# Patient Record
Sex: Female | Born: 1935 | Race: White | Hispanic: No | Smoking: Never smoker
Health system: Southern US, Community
[De-identification: ages and names within clinical notes are randomized; demographics above are authoritative.]

## PROBLEM LIST (undated history)

## (undated) DIAGNOSIS — D649 Anemia, unspecified: Secondary | ICD-10-CM

## (undated) DIAGNOSIS — K900 Celiac disease: Secondary | ICD-10-CM

## (undated) DIAGNOSIS — G629 Polyneuropathy, unspecified: Secondary | ICD-10-CM

## (undated) DIAGNOSIS — F419 Anxiety disorder, unspecified: Secondary | ICD-10-CM

## (undated) DIAGNOSIS — K2950 Unspecified chronic gastritis without bleeding: Secondary | ICD-10-CM

## (undated) DIAGNOSIS — D34 Benign neoplasm of thyroid gland: Secondary | ICD-10-CM

## (undated) DIAGNOSIS — R002 Palpitations: Secondary | ICD-10-CM

## (undated) DIAGNOSIS — E785 Hyperlipidemia, unspecified: Secondary | ICD-10-CM

## (undated) DIAGNOSIS — B029 Zoster without complications: Secondary | ICD-10-CM

## (undated) DIAGNOSIS — H35721 Serous detachment of retinal pigment epithelium, right eye: Secondary | ICD-10-CM

## (undated) DIAGNOSIS — K222 Esophageal obstruction: Secondary | ICD-10-CM

## (undated) DIAGNOSIS — IMO0001 Reserved for inherently not codable concepts without codable children: Secondary | ICD-10-CM

## (undated) DIAGNOSIS — Z79890 Hormone replacement therapy (postmenopausal): Secondary | ICD-10-CM

## (undated) DIAGNOSIS — K219 Gastro-esophageal reflux disease without esophagitis: Secondary | ICD-10-CM

## (undated) DIAGNOSIS — M357 Hypermobility syndrome: Secondary | ICD-10-CM

## (undated) DIAGNOSIS — M810 Age-related osteoporosis without current pathological fracture: Secondary | ICD-10-CM

## (undated) DIAGNOSIS — K449 Diaphragmatic hernia without obstruction or gangrene: Secondary | ICD-10-CM

## (undated) DIAGNOSIS — H269 Unspecified cataract: Secondary | ICD-10-CM

## (undated) DIAGNOSIS — K2970 Gastritis, unspecified, without bleeding: Secondary | ICD-10-CM

## (undated) HISTORY — DX: Gastritis, unspecified, without bleeding: K29.70

## (undated) HISTORY — DX: Zoster without complications: B02.9

## (undated) HISTORY — PX: ESOPHAGOGASTRODUODENOSCOPY: SHX1529

## (undated) HISTORY — DX: Unspecified chronic gastritis without bleeding: K29.50

## (undated) HISTORY — DX: Anemia, unspecified: D64.9

## (undated) HISTORY — DX: Celiac disease: K90.0

## (undated) HISTORY — DX: Palpitations: R00.2

## (undated) HISTORY — PX: COLONOSCOPY: SHX174

## (undated) HISTORY — DX: Serous detachment of retinal pigment epithelium, right eye: H35.721

## (undated) HISTORY — DX: Diaphragmatic hernia without obstruction or gangrene: K44.9

## (undated) HISTORY — PX: CATARACT EXTRACTION: SUR2

## (undated) HISTORY — DX: Reserved for inherently not codable concepts without codable children: IMO0001

## (undated) HISTORY — DX: Anxiety disorder, unspecified: F41.9

## (undated) HISTORY — DX: Hyperlipidemia, unspecified: E78.5

## (undated) HISTORY — DX: Esophageal obstruction: K22.2

## (undated) HISTORY — DX: Gastro-esophageal reflux disease without esophagitis: K21.9

## (undated) HISTORY — DX: Age-related osteoporosis without current pathological fracture: M81.0

## (undated) HISTORY — DX: Unspecified cataract: H26.9

## (undated) HISTORY — PX: RETINAL LASER PROCEDURE: SHX2339

## (undated) HISTORY — DX: Benign neoplasm of thyroid gland: D34

## (undated) HISTORY — DX: Hypermobility syndrome: M35.7

## (undated) HISTORY — DX: Polyneuropathy, unspecified: G62.9

## (undated) HISTORY — PX: MYOMECTOMY: SHX85

## (undated) HISTORY — PX: EYE SURGERY: SHX253

## (undated) HISTORY — PX: SIGMOIDOSCOPY: SUR1295

## (undated) HISTORY — DX: Hormone replacement therapy: Z79.890

## (undated) HISTORY — PX: ABDOMINAL HYSTERECTOMY: SHX81

---
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
## 1972-11-13 HISTORY — PX: PARTIAL HYSTERECTOMY: SHX80

## 1973-11-13 HISTORY — PX: THYROIDECTOMY: SHX17

## 2000-07-31 ENCOUNTER — Encounter: Payer: Self-pay | Admitting: Family Medicine

## 2000-07-31 ENCOUNTER — Ambulatory Visit (HOSPITAL_COMMUNITY): Admission: RE | Admit: 2000-07-31 | Discharge: 2000-07-31 | Payer: Self-pay | Admitting: Family Medicine

## 2003-05-14 ENCOUNTER — Encounter: Admission: RE | Admit: 2003-05-14 | Discharge: 2003-08-12 | Payer: Self-pay | Admitting: Unknown Physician Specialty

## 2003-06-08 ENCOUNTER — Encounter: Payer: Self-pay | Admitting: Gastroenterology

## 2004-05-19 ENCOUNTER — Other Ambulatory Visit: Admission: RE | Admit: 2004-05-19 | Discharge: 2004-05-19 | Payer: Self-pay | Admitting: Family Medicine

## 2005-05-26 ENCOUNTER — Other Ambulatory Visit: Admission: RE | Admit: 2005-05-26 | Discharge: 2005-05-26 | Payer: Self-pay | Admitting: Family Medicine

## 2008-07-23 ENCOUNTER — Telehealth: Payer: Self-pay | Admitting: Gastroenterology

## 2010-01-12 ENCOUNTER — Ambulatory Visit (HOSPITAL_COMMUNITY): Admission: RE | Admit: 2010-01-12 | Discharge: 2010-01-12 | Payer: Self-pay | Admitting: Family Medicine

## 2010-08-05 ENCOUNTER — Encounter: Payer: Self-pay | Admitting: Cardiology

## 2010-08-15 ENCOUNTER — Encounter: Payer: Self-pay | Admitting: Gastroenterology

## 2010-08-16 ENCOUNTER — Encounter: Payer: Self-pay | Admitting: Gastroenterology

## 2010-08-18 ENCOUNTER — Encounter: Payer: Self-pay | Admitting: Gastroenterology

## 2010-08-19 ENCOUNTER — Encounter: Payer: Self-pay | Admitting: Gastroenterology

## 2010-08-31 ENCOUNTER — Ambulatory Visit: Payer: Self-pay | Admitting: Cardiology

## 2010-08-31 DIAGNOSIS — R0789 Other chest pain: Secondary | ICD-10-CM

## 2010-09-26 ENCOUNTER — Encounter: Payer: Self-pay | Admitting: Gastroenterology

## 2010-09-26 DIAGNOSIS — K900 Celiac disease: Secondary | ICD-10-CM | POA: Insufficient documentation

## 2010-09-26 DIAGNOSIS — M810 Age-related osteoporosis without current pathological fracture: Secondary | ICD-10-CM | POA: Insufficient documentation

## 2010-09-26 DIAGNOSIS — K219 Gastro-esophageal reflux disease without esophagitis: Secondary | ICD-10-CM | POA: Insufficient documentation

## 2010-09-26 DIAGNOSIS — K222 Esophageal obstruction: Secondary | ICD-10-CM | POA: Insufficient documentation

## 2010-09-26 DIAGNOSIS — F411 Generalized anxiety disorder: Secondary | ICD-10-CM | POA: Insufficient documentation

## 2010-09-26 DIAGNOSIS — E785 Hyperlipidemia, unspecified: Secondary | ICD-10-CM | POA: Insufficient documentation

## 2010-09-29 ENCOUNTER — Ambulatory Visit: Payer: Self-pay | Admitting: Gastroenterology

## 2010-09-29 DIAGNOSIS — R109 Unspecified abdominal pain: Secondary | ICD-10-CM | POA: Insufficient documentation

## 2010-09-29 DIAGNOSIS — R079 Chest pain, unspecified: Secondary | ICD-10-CM | POA: Insufficient documentation

## 2010-09-30 ENCOUNTER — Ambulatory Visit (HOSPITAL_COMMUNITY)
Admission: RE | Admit: 2010-09-30 | Discharge: 2010-09-30 | Payer: Self-pay | Source: Home / Self Care | Admitting: Gastroenterology

## 2010-09-30 ENCOUNTER — Telehealth: Payer: Self-pay | Admitting: Gastroenterology

## 2010-09-30 DIAGNOSIS — D509 Iron deficiency anemia, unspecified: Secondary | ICD-10-CM

## 2010-09-30 LAB — CONVERTED CEMR LAB
Ferritin: 6.6 ng/mL — ABNORMAL LOW (ref 10.0–291.0)
Folate: >20 ng/mL
Iron: 32 ug/dL — ABNORMAL LOW (ref 42–145)
Saturation Ratios: 6.1 % — ABNORMAL LOW (ref 20.0–50.0)
Transferrin: 375 mg/dL — ABNORMAL HIGH (ref 212.0–360.0)
Vitamin B-12: 562 pg/mL (ref 211–911)

## 2010-10-03 LAB — CONVERTED CEMR LAB
IgA: 244 mg/dL (ref 68–378)
Tissue Transglutaminase Ab, IgA: 53.4 units — ABNORMAL HIGH (ref <20)

## 2010-12-03 ENCOUNTER — Encounter: Payer: Self-pay | Admitting: Family Medicine

## 2010-12-15 NOTE — Procedures (Signed)
Summary: Colonoscopy   Colonoscopy  Procedure date:  06/08/2003  Findings:      Location:  [facility]   Patient Name: Kristi Sawyer, Kristi Sawyer MRN:  Procedure Procedures: Colonoscopy CPT: [phone].  Personnel: Endoscopist: Loralee Pacas. Sharlett Iles, MD.  Exam Location: Exam performed in Outpatient Clinic. Outpatient  Patient Consent: Procedure, Alternatives, Risks and Benefits discussed, consent obtained, from patient. Consent was obtained by the RN.  Indications Symptoms: Diarrhea  Average Risk Screening Routine.  History  Pre-Exam Physical: Performed Jun 08, 2003. Cardio-pulmonary exam, Rectal exam, Abdominal exam, Extremity exam, Mental status exam WNL.  Exam Exam: Extent of exam reached: Cecum, extent intended: Cecum.  The cecum was identified by appendiceal orifice and IC valve. Patient position: on left side. Duration of exam: 20 minutes. Colon retroflexion performed. Images taken. ASA Classification: I. Tolerance: excellent.  Monitoring: Pulse and BP monitoring, Oximetry used. Supplemental O2 given. at 2 Liters.  Colon Prep Used Golytely for colon prep. Prep results: excellent.  Sedation Meds: Patient assessed and found to be appropriate for moderate (conscious) sedation. Fentanyl 50 mcg. given IV. Versed 3 mg. given IV.  Instrument(s): CF 140L. Serial [id number].  Findings - NORMAL EXAM: Cecum to Rectum. Not Seen: Polyps. AVM's. Colitis. Tumors. Crohn's. Diverticulosis.   Assessment Normal examination.  Events  Unplanned Interventions: No intervention was required.  Plans Medication Plan: Continue current medications.  Disposition: After procedure patient sent to recovery. After recovery patient sent home.  Scheduling/Referral: EGD, to [employer]. Sharlett Iles, MD, on Jun 08, 2003.    CC: Redge Gainer, MD  This report was created from the original endoscopy report, which was reviewed and signed by the above listed endoscopist.

## 2010-12-15 NOTE — Letter (Signed)
Summary: Pt's Medical Hx/[facility]  Pt's Medical Hx/[facility]   Imported By: Phillis Knack 10/03/2010 12:19:23  _____________________________________________________________________  External Attachment:    Type:   Image     Comment:   External Document

## 2010-12-15 NOTE — Letter (Signed)
Summary: Dates 08-15-10 to 08-19-10/[facility]  Dates 08-15-10 to 08-19-10/[facility]   Imported By: Phillis Knack 10/03/2010 12:17:13  _____________________________________________________________________  External Attachment:    Type:   Image     Comment:   External Document

## 2010-12-15 NOTE — Letter (Signed)
Summary: New Patient letter  [facility]  [address] [place], [place] [address]   Phone: [phone]  Fax: [phone]       08/18/2010 MRN: [id number]  Kristi Sawyer [place], [place]  [address]  Dear [name],  Welcome to the Gastroenterology Division at [facility].    You are scheduled to see Dr.  Sharlett Iles on 09-01-10 at [facility] on the 3rd floor at [facility], [place] [employer].  We ask that you try to arrive at our office 15 minutes prior to your appointment time to allow for check-in.  We would like you to complete the enclosed self-administered evaluation form prior to your visit and bring it with you on the day of your appointment.  We will review it with you.  Also, please bring a complete list of all your medications or, if you prefer, bring the medication bottles and we will list them.  Please bring your insurance card so that we may make a copy of it.  If your insurance requires a referral to see a specialist, please bring your referral form from your primary care physician.  Co-payments are due at the time of your visit and may be paid by cash, check or credit card.     Your office visit will consist of a consult with your physician (includes a physical exam), any laboratory testing he/she may order, scheduling of any necessary diagnostic testing (e.g. x-ray, ultrasound, CT-scan), and scheduling of a procedure (e.g. Endoscopy, Colonoscopy) if required.  Please allow enough time on your schedule to allow for any/all of these possibilities.    If you cannot keep your appointment, please call [phone] to cancel or reschedule prior to your appointment date.  This allows [place] the opportunity to schedule an appointment for another patient in need of care.  If you do not cancel or reschedule by 5 p.m. the business day prior to your appointment date, you will be charged a $50.00 late cancellation/no-show fee.    Thank you for choosing [facility] for your medical needs.  We appreciate the opportunity to care for you.  Please visit [place] at our website  to learn more about our practice.                     Sincerely,                                                             The Gastroenterology Division

## 2010-12-15 NOTE — Progress Notes (Signed)
Summary: med ?'s  Phone Note Call from Patient Call back at [facility] Phone [phone]   Caller: Patient Call For: Dr. Sharlett Iles Reason for Call: Talk to Nurse Summary of Call: ?'s regarding Omeprazole rx Initial call taken by: Lucien Mons,  September 30, 2010 2:45 PM  Follow-up for Phone Call        advised pt is to take the omeprazole in the morning and take the reglan at bedtime.  Follow-up by: Bernita Buffy CMA Deborra Medina),  September 30, 2010 2:59 PM

## 2010-12-15 NOTE — Procedures (Signed)
Summary: Endoscopy   EGD  Procedure date:  06/08/2003  Findings:      Location: [facility]   Patient Name: Kristi Sawyer, Kristi Sawyer MRN:  Procedure Procedures: Panendoscopy (EGD) CPT: 27618.    with biopsy(s)/brushing(s). CPT: [id number].  Personnel: Endoscopist: Loralee Pacas. Sharlett Iles, MD.  Exam Location: Exam performed in Outpatient Clinic. Outpatient  Patient Consent: Procedure, Alternatives, Risks and Benefits discussed, consent obtained, from patient. Consent was obtained by the RN.  Indications Symptoms: Weight loss. Diarrhea.  History  Pre-Exam Physical: Performed Jun 08, 2003  Cardio-pulmonary exam, Abdominal exam, Extremity exam, Mental status exam WNL.  Exam Exam Info: Maximum depth of insertion Duodenum, intended Duodenum. Patient position: on left side. Duration of exam: 15 minutes. Vocal cords visualized. Gastric retroflexion performed. Images taken. ASA Classification: I. Tolerance: excellent.  Sedation Meds: Patient assessed and found to be appropriate for moderate (conscious) sedation. Cetacaine Spray 1 sprays given aerosolized.  Monitoring: BP and pulse monitoring done. Oximetry used. Supplemental O2 given at 2 Liters.  Instrument(s): PFC 160L. Serial [id number].   Findings - HIATAL HERNIA: Prolapsing, 3 cms. in length. ICD9: Hernia, Hiatal: 553.3. - STRICTURE / STENOSIS: Distal Esophagus.  Etiology: lower esophageal ring. ICD9: Esophageal Stricture: 530.3.  - MUCOSAL ABNORMALITY: Fundus to Antrum. Thin (atrophic) folds. ICD9: Gastritis, Chronic: 535.10. Comment: Atrophic mucosa noted.  - MUCOSAL ABNORMALITY: Duodenal Apex to Duodenal 2nd Portion. Thin (atrophic) folds. Biopsy/Mucosal Abn taken. Comment: Sprue picture with nodular flat folds.   Assessment  Diagnoses: 553.3: Hernia, Hiatal.  530.3: Esophageal Stricture.  535.10: Gastritis, Chronic.   Comments: This patient has celiac sprue disease. Events  Unplanned Intervention: No  unplanned interventions were required.  Plans Medication(s): Await pathology. Continue current medications.  Comments: Gluen free diet needed. Disposition: After procedure patient sent to recovery. After recovery patient sent home.  Scheduling: Clinic Visit, to Loralee Pacas. Sharlett Iles, MD, around Jul 09, 2003.    CC: Redge Gainer, MD  This report was created from the original endoscopy report, which was reviewed and signed by the above listed endoscopist.

## 2010-12-15 NOTE — Assessment & Plan Note (Signed)
Summary: GERD/YF   History of Present Illness Visit Type: Initial Consult Primary GI MD: Verl Blalock MD [place] Primary Provider: Redge Gainer, MD Requesting Provider: Redge Gainer, MD Chief Complaint: Chest pain non-cardiac that radiated to back, GERD History of Present Illness:   Repeat Dictation on an 76 year old Caucasian female with atypical chest pain, chronic GERD, and negative cardiac workup. She has  known celiac disease and follows a gluten-free diet. She denies dysphagia, anorexia, weight loss, lower gastrointestinal, or hepatobiliary complaints. She is on chronic PPI therapy. Previously she was on metoclopramide which was discontinued by Dr. Laurance Flatten. She denies any neurological difficulties. She is on calcium and multivitamins. I cannot see a previous abdominal ultrasound in her record.   GI Review of Systems    Reports acid reflux and  chest pain.      Denies abdominal pain, belching, bloating, dysphagia with liquids, dysphagia with solids, heartburn, loss of appetite, nausea, vomiting, vomiting blood, weight loss, and  weight gain.        Denies anal fissure, black tarry stools, change in bowel habit, constipation, diarrhea, diverticulosis, fecal incontinence, heme positive stool, hemorrhoids, irritable bowel syndrome, jaundice, light color stool, liver problems, rectal bleeding, and  rectal pain.    Current Medications (verified): 1)  Caltrate 600 1500 Mg Tabs (Calcium Carbonate) .Marland Kitchen.. 1 By Mouth Three Times A Day 2)  Multivitamins   Tabs (Multiple Vitamin) .Marland Kitchen.. 1 By Mouth Daily 3)  Vitamin D3 2000 Unit Caps (Cholecalciferol) .Marland Kitchen.. 1 By Mouth Daily 4)  Fish Oil   Oil (Fish Oil) .... 3 By Mouth Daily 5)  B-50  Tabs (Vitamins-Lipotropics) .Marland Kitchen.. 1 By Mouth Daily 6)  Aspirin 81 Mg  Tabs (Aspirin) .Marland Kitchen.. 1 By Mouth Daily 7)  Systane 0.4-0.3 % Soln (Polyethyl Glycol-Propyl Glycol) .... As Directed 8)  Dexilant 60 Mg Cpdr (Dexlansoprazole) .... Take 1 Capsule By Mouth Once  Daily  Allergies (verified): 1)  ! Penicillin 2)  ! Sulfa 3)  ! Codeine  Past History:  Past medical, surgical, family and social histories (including risk factors) reviewed for relevance to current acute and chronic problems.  Past Medical History: Reviewed history from 08/31/2010 and no changes required. Hiatal hernia Dyslipidemia Esophageal stricture Celiac sprue Cataracts Shingles  Past Surgical History: Reviewed history from 08/31/2010 and no changes required. Hysterectomy  right oophorectomy Thyroidectomy on the right for benign tumor Appendectomy  Family History: Reviewed history from 08/31/2010 and no changes required. No early heart disease No FH of Colon Cancer: Lung Cancer: Father Family History of Heart Disease: Mother  Social History: Reviewed history from 09/26/2010 and no changes required. Smoked very  briefly. Widow, no children. Patient has never smoked.  Alcohol Use - no Illicit Drug Use - no Occupation: Retired  Review of Systems       The patient complains of arthritis/joint pain, back pain, and sleeping problems.  The patient denies allergy/sinus, anemia, anxiety-new, blood in urine, breast changes/lumps, change in vision, confusion, cough, coughing up blood, depression-new, fainting, fatigue, fever, headaches-new, hearing problems, heart murmur, heart rhythm changes, itching, menstrual pain, muscle pains/cramps, night sweats, nosebleeds, pregnancy symptoms, shortness of breath, skin rash, sore throat, swelling of feet/legs, swollen lymph glands, thirst - excessive , urination - excessive , urination changes/pain, urine leakage, vision changes, and voice change.    Vital Signs:  Patient profile:   77 year old female Height:      60 inches Weight:      117.38 pounds BMI:     23.01  Pulse rate:   64 / minute Pulse rhythm:   regular BP sitting:   144 / 80  (left arm) Cuff size:   regular  Vitals Entered By: June McMurray [place] Deborra Medina) (September 29, 2010 9:16 AM)  Physical Exam  General:  Well developed, well nourished, no acute distress.healthy appearing.   Head:  Normocephalic and atraumatic. Eyes:  PERRLA, no icterus.exam deferred to patient's ophthalmologist.   Lungs:  Clear throughout to auscultation. Heart:  Regular rate and rhythm; no murmurs, rubs,  or bruits. Abdomen:  Soft, nontender and nondistended. No masses, hepatosplenomegaly or hernias noted. Normal bowel sounds. Extremities:  No clubbing, cyanosis, edema or deformities noted. Neurologic:  Alert and  oriented x4;  grossly normal neurologically. Cervical Nodes:  No significant cervical adenopathy. Psych:  Alert and cooperative. Normal mood and affect.   Impression & Recommendations:  Problem # 1:  CHEST PAIN (ICD-786.50) Assessment Improved Chronic GERD doing well on PPI therapy. I have restarted her metoclopramide 10 mg at bedtime. I have changed her to omeprazole 40 mg a day call she cannot afford other prescription PPI medications. Upper abdominal ultrasound exam, anemia profile, and celiac serologies ordered. She is up-to-date on her endoscopy and colonoscopy exams. Orders: TLB-B12, Serum-Total ONLY (18299-B71) TLB-Ferritin (69678-LFY) TLB-Folic Acid (Folate) (10175-ZWC) TLB-IBC Pnl (Iron/FE;Transferrin) (83550-IBC) T-igA (23800) T-Sprue Panel (Celiac Disease Aby Eval) (83516x3/[address])  Problem # 2:  GASTRITIS, CHRONIC (ICD-535.10) Assessment: Improved Continue gluten-free diet as tolerated with multivitamins and calcium supplementation.  Problem # 3:  OSTEOPENIA (ICD-733.90) Assessment: Improved Probably in part related to chronic calcium malabsorption.  Problem # 4:  ANXIETY (ICD-300.00) Assessment: Improved  Other Orders: Ultrasound Abdomen (UAS)  Patient Instructions: 1)  Copy sent to : Redge Gainer, MD 2)  Stop the Dexilant and pick up the Omeprazole 51m  one by mouth once daily and Reglan 110mby mouth at bedtime.  3)  Your  abdominal ultrasound is scheduled for 09/30/2010, please follow seperate instructions.  4)  Please go to the basement today for your labs.  5)  The medication list was reviewed and reconciled.  All changed / newly prescribed medications were explained.  A complete medication list was provided to the patient / caregiver. 6)  Avoid foods high in acid content ( tomatoes, citrus juices, spicy foods) . Avoid eating within 3 to 4 hours of lying down or before exercising. Do not over eat; try smaller more frequent meals. Elevate head of bed four inches when sleeping.  7)  Celiac Sprue brochure given.  Prescriptions: REGLAN 10 MG TABS (METOCLOPRAMIDE HCL) take one by mouth at bedtime  #30 x 6   Entered by:   LeBernita BuffyMA (AAEdmundson Ac[place]  Authorized by:   DaSable FeilD FACentral Florida Behavioral Hospital Signed by:   LeBernita BuffyMA (AAUticaon 09/29/2010   Method used:   Electronically to        [phone](retail)       [id number]  [id number]     Ph: [phone]     Fax: [phone] RxID:   [id number] 40 MG CPDR (OMEPRAZOLE) take one by mouth every morning  #30 x 6   Entered by:   LeBernita BuffyMA (AADuPage  Authorized by:   DaSable FeilD FAThe Hand And [facility] Signed by:   LeMearl Latin  [place] CMA ([place]) on 09/29/2010   Method used:   Electronically to        [facility] [phone]* (retail)       [place],   [address]       Ph: [phone] or [phone]       Fax: [phone]   RxID:   [id number]   Appended Document: GERD/YF tandem daily  Appended Document: GERD/YF see labs

## 2010-12-15 NOTE — Assessment & Plan Note (Signed)
Summary: [facility]   Visit Type:  Follow-up Primary Provider:  Dr. Laurance Flatten  CC:  chest pain.  History of Present Illness: The patient presented for evaluation of chest discomfort. She said that this happened about 4 weeks ago. She described an discomfort that was moderate in intensity. It occurred at rest. It radiated straight through to her back. It was a lot of gas and feeling like she wanted to burp. It was a fullness. She took 3 TUMS without relief. It went away at about 2 hours.  Current Medications (verified): 1)  Caltrate 600 1500 Mg Tabs (Calcium Carbonate) .Marland Kitchen.. 1 By Mouth Three Times A Day 2)  Multivitamins   Tabs (Multiple Vitamin) .Marland Kitchen.. 1 By Mouth Daily 3)  Vitamin D3 2000 Unit Caps (Cholecalciferol) .Marland Kitchen.. 1 By Mouth Daily 4)  Fish Oil   Oil (Fish Oil) .... 3 By Mouth Daily 5)  B-50  Tabs (Vitamins-Lipotropics) .Marland Kitchen.. 1 By Mouth Daily 6)  Aspirin 81 Mg  Tabs (Aspirin) .Marland Kitchen.. 1 By Mouth Daily 7)  Systane 0.4-0.3 % Soln (Polyethyl Glycol-Propyl Glycol) .... As Directed 8)  Dexilant .Marland Kitchen.. 1 By Mouth Daily  Allergies (verified): 1)  ! Penicillin 2)  ! Sulfa 3)  ! Codeine  Past History:  Past Medical History: Hiatal hernia Dyslipidemia Esophageal stricture Celiac sprue Cataracts Shingles  Past Surgical History: Hysterectomy  right oophorectomy Thyroidectomy on the right for benign tumor Appendectomy  Family History: No early heart disease  Social History: Smoked very  briefly. Widow, no children.  Review of Systems       As stated in the HPI and negative for all other systems.   Vital Signs:  Patient profile:   77 year old female Height:      60 inches Weight:      117 pounds BMI:     22.93 Pulse rate:   85 / minute Resp:     16 per minute BP sitting:   110 / 70  (right arm)  Vitals Entered By: Levora Angel, CNA (August 31, 2010 4:16 PM)  Physical Exam  General:  Well developed, well nourished, in no acute distress. Head:  normocephalic and  atraumatic Eyes:  PERRLA/EOM intact; conjunctiva and lids normal. Mouth:  Teeth, gums and palate normal. Oral mucosa normal. Neck:  Neck supple, no JVD. No masses, thyromegaly or abnormal cervical nodes. Chest Wall:  no deformities or breast masses noted Lungs:  Clear bilaterally to auscultation and percussion. Abdomen:  Bowel sounds positive; abdomen soft and non-tender without masses, organomegaly, or hernias noted. No hepatosplenomegaly. Msk:  Back normal, normal gait. Muscle strength and tone normal. Pulses:  pulses normal in all 4 extremities Extremities:  No clubbing or cyanosis. Neurologic:  Alert and oriented x 3. Skin:  Intact without lesions or rashes. Cervical Nodes:  no significant adenopathy Axillary Nodes:  no significant adenopathy Inguinal Nodes:  no significant adenopathy Psych:  Normal affect.   Detailed Cardiovascular Exam  Neck    Carotids: Carotids full and equal bilaterally without bruits.      Neck Veins: Normal, no JVD.    Heart    Inspection: no deformities or lifts noted.      Palpation: normal PMI with no thrills palpable.      Auscultation: regular rate and rhythm, S1, S2 without murmurs, rubs, gallops, or clicks.    Vascular    Abdominal Aorta: no palpable masses, pulsations, or audible bruits.      Femoral Pulses: normal femoral pulses bilaterally.  Pedal Pulses: normal pedal pulses bilaterally.      Radial Pulses: normal radial pulses bilaterally.      Peripheral Circulation: no clubbing, cyanosis, or edema noted with normal capillary refill.     EKG  Procedure date:  08/05/2010  Findings:      sinus rhythm, rate 88, axis within normal limits, intervals within normal limits, no acute ST-T wave changes  Impression & Recommendations:  Problem # 1:  CHEST DISCOMFORT (ICD-786.59) Her chest discomfort is quite atypical. She has no cardiovascular risk factors and a normal physical exam and EKG. In this situation no further cardiovascular  testing is warranted. She is to have a GI evaluation.  Problem # 2:  Palpitations She has not been bothered by these. She did have an evaluation in the past with monitoring but had no significant dysrhythmias.  Problem # 3:  Dyslipidemia She had a history of this in the primary care chart. I reviewed her lipid profile was excellent. No additional therapy is indicated.

## 2011-01-24 ENCOUNTER — Encounter (HOSPITAL_COMMUNITY): Payer: Medicare Other | Attending: Family Medicine

## 2011-01-24 ENCOUNTER — Encounter (HOSPITAL_COMMUNITY): Payer: Self-pay

## 2011-01-24 DIAGNOSIS — M810 Age-related osteoporosis without current pathological fracture: Secondary | ICD-10-CM | POA: Insufficient documentation

## 2011-03-17 ENCOUNTER — Encounter: Payer: Self-pay | Admitting: Family Medicine

## 2011-04-20 ENCOUNTER — Other Ambulatory Visit: Payer: Self-pay | Admitting: Gastroenterology

## 2011-04-21 ENCOUNTER — Other Ambulatory Visit: Payer: Self-pay | Admitting: Gastroenterology

## 2011-04-24 ENCOUNTER — Other Ambulatory Visit: Payer: Self-pay | Admitting: Gastroenterology

## 2011-04-25 ENCOUNTER — Other Ambulatory Visit: Payer: Self-pay | Admitting: Gastroenterology

## 2011-07-04 ENCOUNTER — Other Ambulatory Visit: Payer: Self-pay | Admitting: Dermatology

## 2011-11-11 ENCOUNTER — Other Ambulatory Visit: Payer: Self-pay | Admitting: Gastroenterology

## 2011-12-10 ENCOUNTER — Other Ambulatory Visit: Payer: Self-pay | Admitting: Gastroenterology

## 2012-01-01 DIAGNOSIS — E559 Vitamin D deficiency, unspecified: Secondary | ICD-10-CM | POA: Diagnosis not present

## 2012-01-01 DIAGNOSIS — R5381 Other malaise: Secondary | ICD-10-CM | POA: Diagnosis not present

## 2012-01-01 DIAGNOSIS — E785 Hyperlipidemia, unspecified: Secondary | ICD-10-CM | POA: Diagnosis not present

## 2012-01-19 DIAGNOSIS — H35379 Puckering of macula, unspecified eye: Secondary | ICD-10-CM | POA: Diagnosis not present

## 2012-01-19 DIAGNOSIS — H26499 Other secondary cataract, unspecified eye: Secondary | ICD-10-CM | POA: Diagnosis not present

## 2012-01-19 DIAGNOSIS — Z961 Presence of intraocular lens: Secondary | ICD-10-CM | POA: Diagnosis not present

## 2012-01-19 DIAGNOSIS — H35359 Cystoid macular degeneration, unspecified eye: Secondary | ICD-10-CM | POA: Diagnosis not present

## 2012-01-23 DIAGNOSIS — H35359 Cystoid macular degeneration, unspecified eye: Secondary | ICD-10-CM | POA: Diagnosis not present

## 2012-01-23 DIAGNOSIS — H35379 Puckering of macula, unspecified eye: Secondary | ICD-10-CM | POA: Diagnosis not present

## 2012-01-24 ENCOUNTER — Other Ambulatory Visit (HOSPITAL_COMMUNITY): Payer: Self-pay

## 2012-01-25 ENCOUNTER — Encounter (HOSPITAL_COMMUNITY)
Admission: RE | Admit: 2012-01-25 | Discharge: 2012-01-25 | Disposition: A | Discharge status: Home / Self Care | Payer: Medicare Other | Source: Ambulatory Visit | Attending: Family Medicine | Admitting: Family Medicine

## 2012-01-25 DIAGNOSIS — M810 Age-related osteoporosis without current pathological fracture: Secondary | ICD-10-CM | POA: Diagnosis not present

## 2012-01-25 MED ORDER — ZOLEDRONIC ACID 5 MG/100ML IV SOLN
5.0000 mg | INTRAVENOUS | Status: AC
  Administered 2012-01-25: 5 mg via INTRAVENOUS
  Filled 2012-01-25: qty 100

## 2012-02-02 ENCOUNTER — Encounter: Payer: Self-pay | Admitting: Gastroenterology

## 2012-02-02 ENCOUNTER — Ambulatory Visit (INDEPENDENT_AMBULATORY_CARE_PROVIDER_SITE_OTHER): Payer: Medicare Other | Admitting: Gastroenterology

## 2012-02-02 VITALS — BP 134/72 | HR 62 | Ht 60.0 in | Wt 118.0 lb

## 2012-02-02 DIAGNOSIS — K219 Gastro-esophageal reflux disease without esophagitis: Secondary | ICD-10-CM

## 2012-02-02 MED ORDER — OMEPRAZOLE 40 MG PO CPDR: 40.0000 mg | DELAYED_RELEASE_CAPSULE | Freq: Every day | ORAL | Status: DC

## 2012-02-02 MED ORDER — METOCLOPRAMIDE HCL 10 MG PO TABS: ORAL_TABLET | ORAL | Status: DC

## 2012-02-02 NOTE — Progress Notes (Signed)
This is a very nice 78 year old Caucasian female with chronic acid reflux doing well on Prilosec 40 mg a day. She is found she takes this medicine before supper with Reglan 10 mg at bedtime for best control of her symptoms. She denies dysphagia, painful swallowing, anorexia or weight loss. Her appetite is good and her weight is stable. Patient has normal bowel movements without melena or hematochezia. He is on an antacid as moderate for bladder spasms which she feels causes her some constipation relieved by when necessary MiraLax  Current Medications, Allergies, Past Medical History, Past Surgical History, Family History and Social History were reviewed in [employer] record.  Pertinent Review of Systems Negative   Physical Exam: Healthy appearing patient who appears younger than her stated age. Blood pressure 134/72 and pulse 62 and regular. BMI 23.05. Cannot appreciate stigmata of chronic liver disease. Chest is clear and she appears to be in a regular rhythm without murmurs gallops or rubs. Her abdomen shows no organomegaly, masses or tenderness. Bowel sounds are normal. Peripheral extremities are unremarkable, mental status is clear.    Assessment and Plan: Chronic GERD under good control with above-mentioned regime. He is up-to-date on her endoscopies and colonoscopies. I reviewed her medications and we'll see her yearly or as needed..Please copy her primary care physician, referring physician, and pertinent subspecialists. No diagnosis found.

## 2012-02-02 NOTE — Patient Instructions (Signed)
We have sent your prescriptions to your pharmacy You will follow up in one year

## 2012-02-13 DIAGNOSIS — L905 Scar conditions and fibrosis of skin: Secondary | ICD-10-CM | POA: Diagnosis not present

## 2012-02-20 DIAGNOSIS — H26499 Other secondary cataract, unspecified eye: Secondary | ICD-10-CM | POA: Diagnosis not present

## 2012-02-20 DIAGNOSIS — Z961 Presence of intraocular lens: Secondary | ICD-10-CM | POA: Diagnosis not present

## 2012-02-28 DIAGNOSIS — H26499 Other secondary cataract, unspecified eye: Secondary | ICD-10-CM | POA: Diagnosis not present

## 2012-03-06 DIAGNOSIS — H26499 Other secondary cataract, unspecified eye: Secondary | ICD-10-CM | POA: Diagnosis not present

## 2012-03-18 DIAGNOSIS — Z961 Presence of intraocular lens: Secondary | ICD-10-CM | POA: Diagnosis not present

## 2012-03-18 DIAGNOSIS — H26499 Other secondary cataract, unspecified eye: Secondary | ICD-10-CM | POA: Diagnosis not present

## 2012-03-19 DIAGNOSIS — N3000 Acute cystitis without hematuria: Secondary | ICD-10-CM | POA: Diagnosis not present

## 2012-03-19 DIAGNOSIS — N3030 Trigonitis without hematuria: Secondary | ICD-10-CM | POA: Diagnosis not present

## 2012-03-19 DIAGNOSIS — N3946 Mixed incontinence: Secondary | ICD-10-CM | POA: Diagnosis not present

## 2012-03-20 DIAGNOSIS — E559 Vitamin D deficiency, unspecified: Secondary | ICD-10-CM | POA: Diagnosis not present

## 2012-03-20 DIAGNOSIS — R5383 Other fatigue: Secondary | ICD-10-CM | POA: Diagnosis not present

## 2012-03-20 DIAGNOSIS — R5381 Other malaise: Secondary | ICD-10-CM | POA: Diagnosis not present

## 2012-03-20 DIAGNOSIS — E785 Hyperlipidemia, unspecified: Secondary | ICD-10-CM | POA: Diagnosis not present

## 2012-03-20 DIAGNOSIS — I10 Essential (primary) hypertension: Secondary | ICD-10-CM | POA: Diagnosis not present

## 2012-03-20 DIAGNOSIS — K219 Gastro-esophageal reflux disease without esophagitis: Secondary | ICD-10-CM | POA: Diagnosis not present

## 2012-03-21 DIAGNOSIS — Z0189 Encounter for other specified special examinations: Secondary | ICD-10-CM | POA: Diagnosis not present

## 2012-03-21 DIAGNOSIS — Z1231 Encounter for screening mammogram for malignant neoplasm of breast: Secondary | ICD-10-CM | POA: Diagnosis not present

## 2012-03-25 DIAGNOSIS — R928 Other abnormal and inconclusive findings on diagnostic imaging of breast: Secondary | ICD-10-CM | POA: Diagnosis not present

## 2012-03-27 DIAGNOSIS — E785 Hyperlipidemia, unspecified: Secondary | ICD-10-CM | POA: Diagnosis not present

## 2012-05-28 DIAGNOSIS — Z01419 Encounter for gynecological examination (general) (routine) without abnormal findings: Secondary | ICD-10-CM | POA: Diagnosis not present

## 2012-05-28 DIAGNOSIS — Z124 Encounter for screening for malignant neoplasm of cervix: Secondary | ICD-10-CM | POA: Diagnosis not present

## 2012-06-03 DIAGNOSIS — D485 Neoplasm of uncertain behavior of skin: Secondary | ICD-10-CM | POA: Diagnosis not present

## 2012-07-31 DIAGNOSIS — E559 Vitamin D deficiency, unspecified: Secondary | ICD-10-CM | POA: Diagnosis not present

## 2012-07-31 DIAGNOSIS — K219 Gastro-esophageal reflux disease without esophagitis: Secondary | ICD-10-CM | POA: Diagnosis not present

## 2012-07-31 DIAGNOSIS — E785 Hyperlipidemia, unspecified: Secondary | ICD-10-CM | POA: Diagnosis not present

## 2012-07-31 DIAGNOSIS — I10 Essential (primary) hypertension: Secondary | ICD-10-CM | POA: Diagnosis not present

## 2012-07-31 DIAGNOSIS — M899 Disorder of bone, unspecified: Secondary | ICD-10-CM | POA: Diagnosis not present

## 2012-08-01 DIAGNOSIS — Z23 Encounter for immunization: Secondary | ICD-10-CM | POA: Diagnosis not present

## 2012-08-06 DIAGNOSIS — H35379 Puckering of macula, unspecified eye: Secondary | ICD-10-CM | POA: Diagnosis not present

## 2012-08-06 DIAGNOSIS — H43819 Vitreous degeneration, unspecified eye: Secondary | ICD-10-CM | POA: Diagnosis not present

## 2012-08-06 DIAGNOSIS — H26499 Other secondary cataract, unspecified eye: Secondary | ICD-10-CM | POA: Diagnosis not present

## 2012-08-06 DIAGNOSIS — H35359 Cystoid macular degeneration, unspecified eye: Secondary | ICD-10-CM | POA: Diagnosis not present

## 2012-09-24 DIAGNOSIS — N3030 Trigonitis without hematuria: Secondary | ICD-10-CM | POA: Diagnosis not present

## 2012-09-24 DIAGNOSIS — A499 Bacterial infection, unspecified: Secondary | ICD-10-CM | POA: Diagnosis not present

## 2012-09-24 DIAGNOSIS — N3000 Acute cystitis without hematuria: Secondary | ICD-10-CM | POA: Diagnosis not present

## 2012-09-24 DIAGNOSIS — N3946 Mixed incontinence: Secondary | ICD-10-CM | POA: Diagnosis not present

## 2012-11-20 DIAGNOSIS — M949 Disorder of cartilage, unspecified: Secondary | ICD-10-CM | POA: Diagnosis not present

## 2012-11-20 DIAGNOSIS — I10 Essential (primary) hypertension: Secondary | ICD-10-CM | POA: Diagnosis not present

## 2012-11-20 DIAGNOSIS — R7989 Other specified abnormal findings of blood chemistry: Secondary | ICD-10-CM | POA: Diagnosis not present

## 2012-11-20 DIAGNOSIS — M899 Disorder of bone, unspecified: Secondary | ICD-10-CM | POA: Diagnosis not present

## 2012-11-20 DIAGNOSIS — K219 Gastro-esophageal reflux disease without esophagitis: Secondary | ICD-10-CM | POA: Diagnosis not present

## 2012-11-20 DIAGNOSIS — E559 Vitamin D deficiency, unspecified: Secondary | ICD-10-CM | POA: Diagnosis not present

## 2012-11-20 DIAGNOSIS — E785 Hyperlipidemia, unspecified: Secondary | ICD-10-CM | POA: Diagnosis not present

## 2012-11-26 DIAGNOSIS — E785 Hyperlipidemia, unspecified: Secondary | ICD-10-CM | POA: Diagnosis not present

## 2013-01-20 DIAGNOSIS — R7989 Other specified abnormal findings of blood chemistry: Secondary | ICD-10-CM | POA: Diagnosis not present

## 2013-01-29 ENCOUNTER — Other Ambulatory Visit (HOSPITAL_COMMUNITY): Payer: Self-pay | Admitting: *Deleted

## 2013-01-30 ENCOUNTER — Ambulatory Visit (HOSPITAL_COMMUNITY)
Admission: RE | Admit: 2013-01-30 | Discharge: 2013-01-30 | Disposition: A | Discharge status: Home / Self Care | Payer: Medicare Other | Source: Ambulatory Visit | Attending: Family Medicine | Admitting: Family Medicine

## 2013-01-30 DIAGNOSIS — M899 Disorder of bone, unspecified: Secondary | ICD-10-CM | POA: Insufficient documentation

## 2013-01-30 MED ORDER — ZOLEDRONIC ACID 5 MG/100ML IV SOLN
INTRAVENOUS | Status: AC
  Administered 2013-01-30: 5 mg
  Filled 2013-01-30: qty 100

## 2013-01-30 MED ORDER — ZOLEDRONIC ACID 5 MG/100ML IV SOLN: 5.0000 mg | Freq: Once | INTRAVENOUS | Status: DC

## 2013-02-06 ENCOUNTER — Other Ambulatory Visit: Payer: Self-pay | Admitting: Gastroenterology

## 2013-02-06 DIAGNOSIS — H35379 Puckering of macula, unspecified eye: Secondary | ICD-10-CM | POA: Diagnosis not present

## 2013-02-06 DIAGNOSIS — H5200 Hypermetropia, unspecified eye: Secondary | ICD-10-CM | POA: Diagnosis not present

## 2013-02-06 DIAGNOSIS — H40019 Open angle with borderline findings, low risk, unspecified eye: Secondary | ICD-10-CM | POA: Diagnosis not present

## 2013-02-06 DIAGNOSIS — Z961 Presence of intraocular lens: Secondary | ICD-10-CM | POA: Diagnosis not present

## 2013-02-06 NOTE — Telephone Encounter (Signed)
PLEASE MAKE A OFFICE VISIT FOR FURTHER REFILLS

## 2013-02-24 DIAGNOSIS — H35359 Cystoid macular degeneration, unspecified eye: Secondary | ICD-10-CM | POA: Diagnosis not present

## 2013-02-24 DIAGNOSIS — H43819 Vitreous degeneration, unspecified eye: Secondary | ICD-10-CM | POA: Diagnosis not present

## 2013-02-24 DIAGNOSIS — H43829 Vitreomacular adhesion, unspecified eye: Secondary | ICD-10-CM | POA: Diagnosis not present

## 2013-02-24 DIAGNOSIS — H35379 Puckering of macula, unspecified eye: Secondary | ICD-10-CM | POA: Diagnosis not present

## 2013-03-04 ENCOUNTER — Telehealth: Payer: Self-pay | Admitting: Pharmacist Clinician (PhC)/ Clinical Pharmacy Specialist

## 2013-03-10 ENCOUNTER — Other Ambulatory Visit: Payer: Self-pay | Admitting: Pharmacist

## 2013-03-10 DIAGNOSIS — E785 Hyperlipidemia, unspecified: Secondary | ICD-10-CM

## 2013-03-10 NOTE — Telephone Encounter (Signed)
Patient came in with friend to office and I discussed what she needed.  She is out of Crestor 39m MWF (gaven #7 samples) Due labs to see how well crestor is working.  Labs ordered for anytime in next week

## 2013-03-12 ENCOUNTER — Other Ambulatory Visit (INDEPENDENT_AMBULATORY_CARE_PROVIDER_SITE_OTHER): Payer: Medicare Other

## 2013-03-12 DIAGNOSIS — E785 Hyperlipidemia, unspecified: Secondary | ICD-10-CM

## 2013-03-12 LAB — COMPLETE METABOLIC PANEL WITH GFR
ALT: 19 U/L (ref 0–35)
Albumin: 3.9 g/dL (ref 3.5–5.2)
CO2: 29 mEq/L (ref 19–32)
Chloride: 105 mEq/L (ref 96–112)
GFR, Est African American: >89 mL/min
Glucose, Bld: 92 mg/dL (ref 70–99)
Potassium: 4.6 mEq/L (ref 3.5–5.3)
Sodium: 140 mEq/L (ref 135–145)
Total Bilirubin: 0.4 mg/dL (ref 0.3–1.2)
Total Protein: 6.2 g/dL (ref 6.0–8.3)

## 2013-03-13 ENCOUNTER — Telehealth: Payer: Self-pay | Admitting: Pharmacist

## 2013-03-13 DIAGNOSIS — E785 Hyperlipidemia, unspecified: Secondary | ICD-10-CM

## 2013-03-13 LAB — NMR LIPOPROFILE WITH LIPIDS
HDL Particle Number: 33.7 umol/L (ref >=30.5)
HDL-C: 58 mg/dL (ref >=40)
LDL Size: 20.4 nm — ABNORMAL LOW (ref >20.5)
Large HDL-P: 11.9 umol/L (ref >=4.8)
Large VLDL-P: 1.6 nmol/L (ref <=2.7)

## 2013-03-13 MED ORDER — ROSUVASTATIN CALCIUM 5 MG PO TABS: ORAL_TABLET | ORAL | Status: DC

## 2013-03-13 NOTE — Telephone Encounter (Signed)
Patient aware of results.

## 2013-03-14 NOTE — Progress Notes (Signed)
Patient came in for labs only.

## 2013-03-18 DIAGNOSIS — N3946 Mixed incontinence: Secondary | ICD-10-CM | POA: Diagnosis not present

## 2013-03-18 DIAGNOSIS — N3030 Trigonitis without hematuria: Secondary | ICD-10-CM | POA: Diagnosis not present

## 2013-03-18 DIAGNOSIS — R351 Nocturia: Secondary | ICD-10-CM | POA: Diagnosis not present

## 2013-03-25 DIAGNOSIS — Z961 Presence of intraocular lens: Secondary | ICD-10-CM | POA: Diagnosis not present

## 2013-03-27 DIAGNOSIS — Z1231 Encounter for screening mammogram for malignant neoplasm of breast: Secondary | ICD-10-CM | POA: Diagnosis not present

## 2013-04-24 ENCOUNTER — Encounter: Payer: Self-pay | Admitting: *Deleted

## 2013-05-08 ENCOUNTER — Other Ambulatory Visit: Payer: Self-pay | Admitting: Gastroenterology

## 2013-05-20 ENCOUNTER — Ambulatory Visit (INDEPENDENT_AMBULATORY_CARE_PROVIDER_SITE_OTHER): Payer: Medicare Other | Admitting: Gastroenterology

## 2013-05-20 ENCOUNTER — Encounter: Payer: Self-pay | Admitting: Gastroenterology

## 2013-05-20 ENCOUNTER — Other Ambulatory Visit (INDEPENDENT_AMBULATORY_CARE_PROVIDER_SITE_OTHER): Payer: Medicare Other

## 2013-05-20 VITALS — BP 114/74 | HR 88 | Ht 60.0 in | Wt 116.0 lb

## 2013-05-20 DIAGNOSIS — K219 Gastro-esophageal reflux disease without esophagitis: Secondary | ICD-10-CM

## 2013-05-20 DIAGNOSIS — K900 Celiac disease: Secondary | ICD-10-CM

## 2013-05-20 LAB — CBC WITH DIFFERENTIAL/PLATELET
Basophils Relative: 0.4 % (ref 0.0–3.0)
Eosinophils Absolute: 0.1 10*3/uL (ref 0.0–0.7)
Eosinophils Relative: 0.6 % (ref 0.0–5.0)
Lymphocytes Relative: 31.8 % (ref 12.0–46.0)
MCHC: 33.2 g/dL (ref 30.0–36.0)
Monocytes Relative: 8.5 % (ref 3.0–12.0)
Neutrophils Relative %: 58.7 % (ref 43.0–77.0)
RBC: 4.44 Mil/uL (ref 3.87–5.11)
WBC: 8.6 10*3/uL (ref 4.5–10.5)

## 2013-05-20 LAB — IBC PANEL
Iron: 96 ug/dL (ref 42–145)
Saturation Ratios: 23.3 % (ref 20.0–50.0)
Transferrin: 293.7 mg/dL (ref 212.0–360.0)

## 2013-05-20 MED ORDER — OMEPRAZOLE 40 MG PO CPDR: 40.0000 mg | DELAYED_RELEASE_CAPSULE | Freq: Every day | ORAL | Status: DC

## 2013-05-20 MED ORDER — METOCLOPRAMIDE HCL 10 MG PO TABS: 10.0000 mg | ORAL_TABLET | Freq: Every day | ORAL | Status: DC

## 2013-05-20 NOTE — Progress Notes (Signed)
This is a very pleasant 79 year old Caucasian female with chronic GERD bedtime which he takes because of nocturnal acid reflux.  She currently denies any gastrointestinal symptoms.  She has celiac disease confirmed by celiac serologies, and is on a gluten-free diet, and maintains a normal weight and denies diarrhea or any symptoms of malabsorption, has no recurrent skin rashes, but does have mild osteoporosis.  She has no siblings and no children.  Review of recent labs showed a normal metabolic profile, but I could not see a CBC.  She denies rectal bleeding, and is up-to-date on her endoscopy and colonoscopy exams.  Current Medications, Allergies, Past Medical History, Past Surgical History, Family History and Social History were reviewed in [employer] record.  ROS: All systems were reviewed and are negative unless otherwise stated in the HPI.          Physical Exam: Healthy-appearing patient in no distress.  Pressure 114/74, pulse 88 and regular and weight 116 with a BMI of 22.65.  I cannot appreciate stigmata of chronic liver disease.  Her chest is entirely clear and she is in a regular rhythm without murmurs gallops or rubs.  There is no organomegaly, abdominal masses or tenderness.  Bowel sounds are normal.  Peripheral extremities are unremarkable and mental status is normal.   Assessment and Plan: Celiac disease with chronic osteoporosis and 79 year old Caucasian female who is doing extremely well on the gluten-free diet.  She also has chronic GERD is controlled with twice a day PPI therapy and Reglan at bedtime.  She's had no side effects from any of these medications.  Center by the lab to check followup anemia profile, CBC, and gluten serology panel.  I've renewed her medications, will see her yearly or when necessary as needed.  His copy this note to Dr. Redge Gainer that [facility] Encounter Diagnoses  Name Primary?  . GERD (gastroesophageal  reflux disease) Yes  . Celiac disease

## 2013-05-20 NOTE — Patient Instructions (Addendum)
  We have sent the following medications to your pharmacy for you to pick up at your convenience: Omeprazole 40 mg, take one capsule by mouth once daily   Reglan 10 mg, take by mouth at bedtime   Your physician has requested that you go to the basement for the following lab work before leaving today: CBC Celiac Panel Anemia Panel  ___________________________________________________________________                                               We are excited to introduce MyChart, a new best-in-class service that provides you online access to important information in your electronic medical record. We want to make it easier for you to view your health information - all in one secure location - when and where you need it. We expect MyChart will enhance the quality of care and service we provide.  When you register for MyChart, you can:    View your test results.    Request appointments and receive appointment reminders via email.    Request medication renewals.    View your medical history, allergies, medications and immunizations.    Communicate with your physician's office through a password-protected site.    Conveniently print information such as your medication lists.  To find out if MyChart is right for you, please talk to a member of our clinical staff today. We will gladly answer your questions about this free health and wellness tool.  If you are age 77 or older and want a member of your family to have access to your record, you must provide written consent by completing a proxy form available at our office. Please speak to our clinical staff about guidelines regarding accounts for patients younger than age 27.  As you activate your MyChart account and need any technical assistance, please call the MyChart technical support line at [phone]) or email your question to [email or web address]. If you email your question(s), please include your name, a return  phone number and the best time to reach you.  If you have non-urgent health-related questions, you can send a message to our office through [place] at [email or web address]. If you have a medical emergency, call 911.  Thank you for using MyChart as your new health and wellness resource!   MyChart licensed from [employer],  1999-2010. Patents Pending.

## 2013-05-21 LAB — CELIAC PANEL 10
Endomysial Screen: NEGATIVE
Gliadin IgA: 106 U/mL — ABNORMAL HIGH (ref <20)
IgA: 223 mg/dL (ref 69–380)

## 2013-05-22 ENCOUNTER — Ambulatory Visit: Payer: Self-pay | Admitting: Family Medicine

## 2013-05-30 ENCOUNTER — Other Ambulatory Visit: Payer: Self-pay | Admitting: *Deleted

## 2013-05-30 DIAGNOSIS — D649 Anemia, unspecified: Secondary | ICD-10-CM

## 2013-05-30 DIAGNOSIS — R6889 Other general symptoms and signs: Secondary | ICD-10-CM

## 2013-05-30 MED ORDER — FERROUS FUM-IRON POLYSACCH-FA 162-115.2-1 MG PO CAPS: 1.0000 | ORAL_CAPSULE | Freq: Every day | ORAL | Status: DC

## 2013-06-02 ENCOUNTER — Telehealth: Payer: Self-pay | Admitting: Gastroenterology

## 2013-06-02 NOTE — Telephone Encounter (Signed)
Pharmacy rx verified, pt is on [facility].

## 2013-06-03 ENCOUNTER — Encounter: Payer: Self-pay | Admitting: Family Medicine

## 2013-06-03 ENCOUNTER — Ambulatory Visit (INDEPENDENT_AMBULATORY_CARE_PROVIDER_SITE_OTHER): Payer: Medicare Other | Admitting: Family Medicine

## 2013-06-03 ENCOUNTER — Other Ambulatory Visit: Payer: Self-pay | Admitting: *Deleted

## 2013-06-03 VITALS — BP 130/77 | HR 76 | Temp 98.7°F | Ht 60.0 in | Wt 116.0 lb

## 2013-06-03 DIAGNOSIS — M8580 Other specified disorders of bone density and structure, unspecified site: Secondary | ICD-10-CM

## 2013-06-03 DIAGNOSIS — E785 Hyperlipidemia, unspecified: Secondary | ICD-10-CM | POA: Diagnosis not present

## 2013-06-03 DIAGNOSIS — IMO0001 Reserved for inherently not codable concepts without codable children: Secondary | ICD-10-CM

## 2013-06-03 DIAGNOSIS — E039 Hypothyroidism, unspecified: Secondary | ICD-10-CM | POA: Diagnosis not present

## 2013-06-03 DIAGNOSIS — M899 Disorder of bone, unspecified: Secondary | ICD-10-CM | POA: Diagnosis not present

## 2013-06-03 DIAGNOSIS — M949 Disorder of cartilage, unspecified: Secondary | ICD-10-CM

## 2013-06-03 DIAGNOSIS — Z1212 Encounter for screening for malignant neoplasm of rectum: Secondary | ICD-10-CM | POA: Diagnosis not present

## 2013-06-03 LAB — BASIC METABOLIC PANEL WITH GFR
CO2: 29 mEq/L (ref 19–32)
GFR, Est African American: >89 mL/min
Glucose, Bld: 104 mg/dL — ABNORMAL HIGH (ref 70–99)
Potassium: 5 mEq/L (ref 3.5–5.3)
Sodium: 141 mEq/L (ref 135–145)

## 2013-06-03 LAB — HEPATIC FUNCTION PANEL
Alkaline Phosphatase: 40 U/L (ref 39–117)
Indirect Bilirubin: 0.2 mg/dL (ref 0.0–0.9)
Total Bilirubin: 0.3 mg/dL (ref 0.3–1.2)

## 2013-06-03 NOTE — Progress Notes (Signed)
  Subjective:    Patient ID: Kristi Sawyer, female    DOB: 1926-01-05, 79 y.o.   MRN: [id number]  HPI Patient returns to clinic today for followup and management of chronic medical problems. She has a history of hyperlipidemia, deficiency, chronic gastritis with GERD and esophageal stricture. She is also followed by the gastroenterologist for celiac sprue.   Review of Systems  Constitutional: Negative.   HENT: Negative.   Eyes: Negative.   Respiratory: Negative.   Cardiovascular: Negative.   Gastrointestinal: Negative.   Endocrine: Negative.   Genitourinary: Negative.   Musculoskeletal: Negative.   Skin: Negative.   Allergic/Immunologic: Negative.   Neurological: Negative.   Hematological: Negative.   Psychiatric/Behavioral: Negative.        Objective:   Physical Exam BP 130/77  Pulse 76  Temp(Src) 98.7 F (37.1 C) (Oral)  Ht 5' (1.524 m)  Wt 116 lb (52.617 kg)  BMI 22.65 kg/m2  The patient appeared well nourished and normally developed, alert and oriented to time and place. She appears to be much younger than her stated age of 79 per Speech, behavior and judgement appear normal. Vital signs as documented.  Head exam is unremarkable. No scleral icterus or pallor noted. She has an irritated nasal cavity on the right with an eschar present. Mouth and throat and ears were normal. Neck is without jugular venous distension, thyromegally, or carotid bruits. Carotid upstrokes are brisk bilaterally. No cervical adenopathy. Lungs are clear anteriorly and posteriorly to auscultation. Normal respiratory effort. Cardiac exam reveals regular rate and rhythm at 84 per minute. First and second heart sounds normal.  No murmurs, rubs or gallops.  Abdominal exam reveals normal bowl sounds, no masses, no organomegaly and no aortic enlargement. No inguinal adenopathy. Extremities are nonedematous and both femoral and pedal pulses are normal. Skin without pallor or jaundice.  Warm and dry,  without rash except for a patchy area of skin that is pruritic on the left calf. Neurologic exam reveals normal deep tendon reflexes and normal sensation.          Assessment & Plan:  1. Osteopenia - BASIC METABOLIC PANEL WITH GFR  2. Euthyroid - Thyroid Panel With TSH - BASIC METABOLIC PANEL WITH GFR  3. Other and unspecified hyperlipidemia - Hepatic function panel - NMR Lipoprofile with Lipids - BASIC METABOLIC PANEL WITH GFR  4. Iron deficiency anemia -Continue current medication and check CBC next visit  5. Fungal rash left calf -Use Lamisil twice daily  6. Osteopenia -DEXA scan dueNovember  Patient Instructions  Fall precautions discussed at visit Take medications as directed. We will check with the gastroenterologist regarding the need for another colonoscopy or endoscopy, and let you know. Use Ayr nose spray and Ayr gel as directed You can purchase Lamisil over-the-counter and apply this twice a day to area of skin on left calf Always drank plenty of fluids and especially this summer as to not get dehydrated, especially because of all the walking that you're   Arrie Senate MD

## 2013-06-03 NOTE — Patient Instructions (Addendum)
Fall precautions discussed at visit Take medications as directed. We will check with the gastroenterologist regarding the need for another colonoscopy or endoscopy, and let you know. Use Ayr nose spray and Ayr gel as directed You can purchase Lamisil over-the-counter and apply this twice a day to area of skin on left calf Always drank plenty of fluids and especially this summer as to not get dehydrated, especially because of all the walking that you're

## 2013-06-04 ENCOUNTER — Other Ambulatory Visit: Payer: Self-pay | Admitting: Family Medicine

## 2013-06-04 ENCOUNTER — Other Ambulatory Visit (INDEPENDENT_AMBULATORY_CARE_PROVIDER_SITE_OTHER): Payer: Medicare Other

## 2013-06-04 DIAGNOSIS — M949 Disorder of cartilage, unspecified: Secondary | ICD-10-CM | POA: Diagnosis not present

## 2013-06-04 DIAGNOSIS — E039 Hypothyroidism, unspecified: Secondary | ICD-10-CM | POA: Diagnosis not present

## 2013-06-04 DIAGNOSIS — Z1212 Encounter for screening for malignant neoplasm of rectum: Secondary | ICD-10-CM

## 2013-06-04 DIAGNOSIS — E785 Hyperlipidemia, unspecified: Secondary | ICD-10-CM | POA: Diagnosis not present

## 2013-06-04 LAB — NMR LIPOPROFILE WITH LIPIDS
HDL Particle Number: 35.2 umol/L (ref >=30.5)
HDL Size: 9.3 nm (ref >=9.2)
HDL-C: 64 mg/dL (ref >=40)
LP-IR Score: <25 (ref <=45)
Large HDL-P: 10.3 umol/L (ref >=4.8)
Triglycerides: 105 mg/dL (ref <150)

## 2013-06-04 LAB — THYROID PANEL WITH TSH
Free Thyroxine Index: 2.8 (ref 1.0–3.9)
T3 Uptake: 30.6 % (ref 22.5–37.0)

## 2013-06-05 ENCOUNTER — Other Ambulatory Visit: Payer: Self-pay

## 2013-06-05 LAB — FECAL OCCULT BLOOD, IMMUNOCHEMICAL: Fecal Occult Blood: NEGATIVE

## 2013-06-05 MED ORDER — ROSUVASTATIN CALCIUM 5 MG PO TABS: ORAL_TABLET | ORAL | Status: DC

## 2013-06-09 ENCOUNTER — Telehealth: Payer: Self-pay | Admitting: Family Medicine

## 2013-06-16 ENCOUNTER — Telehealth: Payer: Self-pay | Admitting: Family Medicine

## 2013-06-16 ENCOUNTER — Encounter: Payer: Self-pay | Admitting: Family Medicine

## 2013-06-16 ENCOUNTER — Ambulatory Visit (INDEPENDENT_AMBULATORY_CARE_PROVIDER_SITE_OTHER): Payer: Medicare Other | Admitting: Family Medicine

## 2013-06-16 VITALS — BP 122/71 | HR 70 | Temp 97.2°F | Ht 60.0 in | Wt 117.4 lb

## 2013-06-16 DIAGNOSIS — S81802D Unspecified open wound, left lower leg, subsequent encounter: Secondary | ICD-10-CM

## 2013-06-16 DIAGNOSIS — E785 Hyperlipidemia, unspecified: Secondary | ICD-10-CM

## 2013-06-16 DIAGNOSIS — Z5189 Encounter for other specified aftercare: Secondary | ICD-10-CM | POA: Diagnosis not present

## 2013-06-16 MED ORDER — CIPROFLOXACIN HCL 500 MG PO TABS: 500.0000 mg | ORAL_TABLET | Freq: Two times a day (BID) | ORAL | Status: DC

## 2013-06-16 NOTE — Telephone Encounter (Signed)
appt mde

## 2013-06-16 NOTE — Progress Notes (Addendum)
  Subjective:    Patient ID: Kristi Sawyer, female    DOB: [birth year], 79 y.o.   MRN: [id number]  HPI Patient comes in today for a left calf rash about the size of a $0.50 piece. It has gotten larger and has become more painful since it was first observed and being treated as if it was a fungal skin infection. She also brought up the fact that she needs more statin drug because of a rising cholesterol.   Review of Systems  Constitutional: Negative.  Negative for fever and fatigue.  HENT: Negative for ear pain, congestion, sneezing and postnasal drip.   Eyes: Negative.   Respiratory: Negative.   Cardiovascular: Negative.   Gastrointestinal: Negative.   Endocrine: Negative.   Genitourinary: Negative.   Musculoskeletal: Negative.   Skin: Positive for wound (L calf, redness, ulceration).  Allergic/Immunologic: Negative.   Neurological: Negative.   Hematological: Negative.   Psychiatric/Behavioral: Negative.        Objective:   Physical Exam  Constitutional: She is oriented to person, place, and time. She appears well-developed and well-nourished. No distress.  Musculoskeletal: Normal range of motion. She exhibits no edema.  Neurological: She is alert and oriented to person, place, and time.  Skin: Skin is warm and dry. Rash noted. There is erythema.  Elliptical skin one about 0.5" x 1" with crusting left posterior calf  Psychiatric: She has a normal mood and affect. Her behavior is normal. Judgment and thought content normal.          Assessment & Plan:  Skin wound left posterior calf -See cleansing instructions below  Hyperlipidemia -Increase Crestor to 5 mg daily -Check LFTs in about 6 week  Patient Instructions  Clean with Betadine solution twice daily Apply Betadine wet to dry dressings daily Take antibiotics as directed Return to clinic in 8 days for recheck   Take Cipro 500 twice daily for 10 days  Arrie Senate MD

## 2013-06-16 NOTE — Telephone Encounter (Signed)
Discussed with pt at appt

## 2013-06-16 NOTE — Patient Instructions (Addendum)
Clean with Betadine solution twice daily Apply Betadine wet to dry dressings daily Take antibiotics as directedtake 500 mg twice daily for 10 days Return to clinic in 8 days for recheck

## 2013-06-16 NOTE — Addendum Note (Signed)
Addended by: Marin Olp on: 06/16/2013 01:03 PM   Modules accepted: Orders

## 2013-06-18 NOTE — Telephone Encounter (Signed)
appt rescheduled Pt notified

## 2013-06-19 ENCOUNTER — Telehealth: Payer: Self-pay | Admitting: Family Medicine

## 2013-06-19 LAB — AEROBIC CULTURE

## 2013-06-23 MED ORDER — DOXYCYCLINE HYCLATE 100 MG PO TABS: 100.0000 mg | ORAL_TABLET | Freq: Two times a day (BID) | ORAL | Status: DC

## 2013-06-23 NOTE — Telephone Encounter (Signed)
Wound culture grew staph.  Patient is actually taking Cipro which the bacteria is resistant to. Wound has not improved but has not worsened either.  She is keeping it clean and dry. Per note on lab report medication changed to Doxycycline 158m BID x 10 days. Patient aware to d/c Cipro and start Doxy. She has a f/u appt in 2 days with Dr. MLaurance Flatten She will notify u[place]if she has any problems with the antibiotic or if there is any worsening of her symptoms.

## 2013-06-25 ENCOUNTER — Ambulatory Visit (INDEPENDENT_AMBULATORY_CARE_PROVIDER_SITE_OTHER): Payer: Medicare Other | Admitting: Family Medicine

## 2013-06-25 ENCOUNTER — Encounter: Payer: Self-pay | Admitting: Family Medicine

## 2013-06-25 VITALS — BP 151/78 | HR 78 | Temp 98.7°F | Ht 60.5 in | Wt 120.0 lb

## 2013-06-25 DIAGNOSIS — S81009A Unspecified open wound, unspecified knee, initial encounter: Secondary | ICD-10-CM

## 2013-06-25 DIAGNOSIS — S91009A Unspecified open wound, unspecified ankle, initial encounter: Secondary | ICD-10-CM | POA: Diagnosis not present

## 2013-06-25 DIAGNOSIS — Z5189 Encounter for other specified aftercare: Secondary | ICD-10-CM

## 2013-06-25 DIAGNOSIS — S81802D Unspecified open wound, left lower leg, subsequent encounter: Secondary | ICD-10-CM

## 2013-06-25 NOTE — Progress Notes (Signed)
  Subjective:    Patient ID: Kristi Sawyer, female    DOB: 1926/05/20, 79 y.o.   MRN: [id number]  HPI Patient comes back today for followup of leg wound which appeared to have started as a fungal infection and has since been superinfected with bacteria which grew out staph on her recent culture. The sensitivity results indicated that the antibiotic of choice would be doxycycline and she has been on this for a couple of days   Review of Systems Patient says that the wound which is about the size of a $0.50 piece and is superficial is not painful unless she touches it when she's sitting. There are no other positive review of systems.    Objective:   Physical Exam There is a bout a 1-1/2 inch diameter area of erythema above the medial malleolus on the right leg. There is a small area that is superficial of ulceration in this. She had a Betadine wet to dry dressing on it and there was no tape directly to her skin.       Assessment & Plan:  1. Leg wound, left, subsequent encounter -Continue doxycycline 100 twice daily  Patient Instructions  Continue antibiotic until finished Clean gently with soap and water Use betadine wet to dry dressing at night Return to clinic in 1 week for recheck   Arrie Senate MD

## 2013-06-25 NOTE — Patient Instructions (Signed)
Continue antibiotic until finished Clean gently with soap and water Use betadine wet to dry dressing at night Return to clinic in 1 week for recheck

## 2013-07-03 ENCOUNTER — Encounter: Payer: Self-pay | Admitting: Family Medicine

## 2013-07-03 ENCOUNTER — Ambulatory Visit: Payer: Medicare Other | Admitting: Family Medicine

## 2013-07-03 ENCOUNTER — Ambulatory Visit (INDEPENDENT_AMBULATORY_CARE_PROVIDER_SITE_OTHER): Payer: Medicare Other | Admitting: Family Medicine

## 2013-07-03 VITALS — BP 142/78 | HR 82 | Temp 97.0°F | Ht 60.0 in | Wt 118.0 lb

## 2013-07-03 DIAGNOSIS — L02419 Cutaneous abscess of limb, unspecified: Secondary | ICD-10-CM

## 2013-07-03 DIAGNOSIS — S81009A Unspecified open wound, unspecified knee, initial encounter: Secondary | ICD-10-CM

## 2013-07-03 DIAGNOSIS — S81802D Unspecified open wound, left lower leg, subsequent encounter: Secondary | ICD-10-CM

## 2013-07-03 DIAGNOSIS — Z5189 Encounter for other specified aftercare: Secondary | ICD-10-CM

## 2013-07-03 NOTE — Progress Notes (Signed)
  Subjective:    Patient ID: Kristi Sawyer, female    DOB: Dec 19, 1925, 79 y.o.   MRN: [id number]  HPI Patient comes in today for followup of staph cellulitis of left leg. The patient indicates that she is pleased with this improvement. There's been no additional drainage. She finished her doxycycline course. She has been using cortisone cream on the lesion.   Review of Systems  Constitutional: Negative.   HENT: Negative.   Eyes: Negative.   Respiratory: Negative.   Cardiovascular: Negative.   Gastrointestinal: Negative.   Endocrine: Negative.   Genitourinary: Negative.   Skin: Positive for wound (L leg, improving).  Allergic/Immunologic: Negative.   Neurological: Negative.   Psychiatric/Behavioral: Negative.        Objective:   Physical Exam Left leg cellulitis much improved. The scar is red but there is no drainage. There is no rubor.       Assessment & Plan:  Leg wound, left, subsequent encounter  Patient Instructions  Finish doxycycline. Use Bactroban ointment to keep area of healing wound moisturized Return to clinic or refill doxycycline if any worsening of the lesion Patient is aware of the above   Arrie Senate MD

## 2013-07-03 NOTE — Patient Instructions (Signed)
Finish doxycycline. Use Bactroban ointment to keep area of healing wound moisturized Return to clinic or refill doxycycline if any worsening of the lesion Patient is aware of the above

## 2013-07-25 ENCOUNTER — Other Ambulatory Visit: Payer: Self-pay | Admitting: Gastroenterology

## 2013-08-04 ENCOUNTER — Encounter: Payer: Self-pay | Admitting: Family Medicine

## 2013-08-04 ENCOUNTER — Ambulatory Visit (INDEPENDENT_AMBULATORY_CARE_PROVIDER_SITE_OTHER): Payer: Medicare Other | Admitting: Family Medicine

## 2013-08-04 VITALS — BP 137/76 | HR 77 | Temp 99.1°F | Ht 60.0 in | Wt 119.0 lb

## 2013-08-04 DIAGNOSIS — K219 Gastro-esophageal reflux disease without esophagitis: Secondary | ICD-10-CM

## 2013-08-04 DIAGNOSIS — D509 Iron deficiency anemia, unspecified: Secondary | ICD-10-CM

## 2013-08-04 DIAGNOSIS — F411 Generalized anxiety disorder: Secondary | ICD-10-CM | POA: Diagnosis not present

## 2013-08-04 DIAGNOSIS — M899 Disorder of bone, unspecified: Secondary | ICD-10-CM

## 2013-08-04 DIAGNOSIS — Z23 Encounter for immunization: Secondary | ICD-10-CM | POA: Diagnosis not present

## 2013-08-04 DIAGNOSIS — E785 Hyperlipidemia, unspecified: Secondary | ICD-10-CM

## 2013-08-04 LAB — POCT CBC
Granulocyte percent: 58 %G (ref 37–80)
HCT, POC: 42.1 % (ref 37.7–47.9)
Lymph, poc: 3.5 — AB (ref 0.6–3.4)
MPV: 8.3 fL (ref 0–99.8)
POC Granulocyte: 5.1 (ref 2–6.9)
POC LYMPH PERCENT: 39.4 %L (ref 10–50)
Platelet Count, POC: 229 10*3/uL (ref 142–424)
RBC: 4.7 M/uL (ref 4.04–5.48)

## 2013-08-04 NOTE — Progress Notes (Signed)
Subjective:    Patient ID: Kristi Sawyer, female    DOB: 06-24-1926, 79 y.o.   MRN: [id number]  HPI Pt here for follow up and management of chronic medical problems. Patient is doing well, walks EVERY day and she has a positive attitude about her life.  Patient Active Problem List   Diagnosis Date Noted  . IRON DEFICIENCY 09/30/2010  . GERD 09/29/2010  . CHEST PAIN 09/29/2010  . ABDOMINAL PAIN 09/29/2010  . HYPERLIPIDEMIA 09/26/2010  . ANXIETY 09/26/2010  . ESOPHAGEAL STRICTURE 09/26/2010  . GASTRITIS, CHRONIC 09/26/2010  . HIATAL HERNIA WITH REFLUX 09/26/2010  . CELIAC SPRUE 09/26/2010  . OSTEOPENIA 09/26/2010  . CHEST DISCOMFORT 08/31/2010   Outpatient Encounter Prescriptions as of 08/04/2013  Medication Sig Dispense Refill  . aspirin (SB LOW DOSE ASA EC) 81 MG EC tablet Take 81 mg by mouth daily.        . Calcium Carbonate-Vit D-Min (CALTRATE PLUS PO) Take by mouth 2 (two) times daily.        . Cholecalciferol (VITAMIN D) 2000 UNITS CAPS Take by mouth as directed.       . Fe Fum-FePoly-Vit C-Vit B3 (INTEGRA) 62.5-62.5-40-3 MG CAPS TAKE 1 CAPSULE BY MOUTH ONCE DAILY  30 capsule  1  . FIBER PO Take by mouth as directed.      . metoCLOPramide (REGLAN) 10 MG tablet Take 1 tablet (10 mg total) by mouth at bedtime.  90 tablet  0  . Multiple Vitamins-Minerals (CENTRUM SILVER PO) Take by mouth.        . Omega-3 Fatty Acids (FISH OIL) 1000 MG CAPS Take 1 capsule by mouth daily.      Marland Kitchen omeprazole (PRILOSEC OTC) 20 MG tablet Take 20 mg by mouth daily. In the AM      . omeprazole (PRILOSEC) 40 MG capsule Take 1 capsule (40 mg total) by mouth daily.  90 capsule  6  . Polyethyl Glycol-Propyl Glycol (SYSTANE OP) Apply to eye. 1- 2 drop       . rosuvastatin (CRESTOR) 5 MG tablet Take 1 tablet on MWF  15 tablet  5  . Sennosides-Docusate Sodium (SENNA PLUS PO) Take by mouth as directed.      . trospium (SANCTURA) 20 MG tablet Take 20 mg by mouth 2 (two) times daily.      . Zoledronic Acid  (RECLAST IV) Inject into the vein. 12/2009        . [DISCONTINUED] ciprofloxacin (CIPRO) 500 MG tablet Take 1 tablet (500 mg total) by mouth 2 (two) times daily.  20 tablet  0  . [DISCONTINUED] doxycycline (VIBRA-TABS) 100 MG tablet Take 1 tablet (100 mg total) by mouth 2 (two) times daily.  20 tablet  0  . [DISCONTINUED] Ferrous Fum-Iron Polysacch-FA 162-115.2-1 MG CAPS Take 1 capsule by mouth daily.  30 each  1   No facility-administered encounter medications on file as of 08/04/2013.       Review of Systems  Constitutional: Negative.   HENT: Negative.   Eyes: Negative.   Respiratory: Negative.   Cardiovascular: Negative.   Gastrointestinal: Negative.   Endocrine: Negative.   Genitourinary: Negative.   Musculoskeletal: Negative.   Skin: Negative.   Allergic/Immunologic: Negative.   Neurological: Negative.   Hematological: Negative.   Psychiatric/Behavioral: Negative.        Objective:   Physical Exam  Nursing note and vitals reviewed. Constitutional: She is oriented to person, place, and time. She appears well-developed and well-nourished. No distress.  HENT:  Head: Normocephalic and atraumatic.  Right Ear: External ear normal.  Left Ear: External ear normal.  Nose: Nose normal.  Mouth/Throat: Oropharynx is clear and moist.  Eyes: Conjunctivae and EOM are normal. Pupils are equal, round, and reactive to light. Right eye exhibits no discharge. Left eye exhibits no discharge. No scleral icterus.  Neck: Normal range of motion. Neck supple. No thyromegaly present.  Cardiovascular: Normal rate, regular rhythm and normal heart sounds.  Exam reveals no gallop.   No murmur heard. At 72 per minute  Pulmonary/Chest: Effort normal and breath sounds normal. No respiratory distress. She has no wheezes. She has no rales.  Abdominal: Soft. Bowel sounds are normal. She exhibits no distension. There is no tenderness. There is no rebound and no guarding.  Musculoskeletal: Normal range  of motion. She exhibits no edema.  Lymphadenopathy:    She has no cervical adenopathy.  Neurological: She is alert and oriented to person, place, and time. She has normal reflexes.  Skin: Skin is warm and dry.  Psychiatric: She has a normal mood and affect. Her behavior is normal. Judgment and thought content normal.    BP 137/76  Pulse 77  Temp(Src) 99.1 F (37.3 C) (Oral)  Ht 5' (1.524 m)  Wt 119 lb (53.978 kg)  BMI 23.24 kg/m2       Assessment & Plan:   1. HYPERLIPIDEMIA   2. IRON DEFICIENCY   3. ANXIETY   4. GERD   5. OSTEOPENIA    Orders Placed This Encounter  Procedures  . DG Bone Density    Standing Status: Future     Number of Occurrences:      Standing Expiration Date: 10/04/2014    Order Specific Question:  Reason for Exam (SYMPTOM  OR DIAGNOSIS REQUIRED)    Answer:  OSTEOPENIA    Order Specific Question:  Preferred imaging location?    Answer:  Internal  . Hepatic function panel  . BMP8+EGFR  . NMR, lipoprofile  . POCT CBC   Patient Instructions  Always and continue to be careful and do not put yourself at risk for falling Continue aggressive therapeutic lifestyle changes Take medication as directed We will give you your flu shot today Do not forget to get your DEXA scan in December   Arrie Senate MD

## 2013-08-04 NOTE — Patient Instructions (Signed)
Always and continue to be careful and do not put yourself at risk for falling Continue aggressive therapeutic lifestyle changes Take medication as directed We will give you your flu shot today Do not forget to get your DEXA scan in December

## 2013-08-06 LAB — HEPATIC FUNCTION PANEL
ALT: 17 IU/L (ref 0–32)
Albumin: 4.3 g/dL (ref 3.5–4.7)
Alkaline Phosphatase: 40 IU/L (ref 39–117)
Total Bilirubin: 0.2 mg/dL (ref 0.0–1.2)
Total Protein: 6.1 g/dL (ref 6.0–8.5)

## 2013-08-06 LAB — NMR, LIPOPROFILE
HDL Cholesterol by NMR: 63 mg/dL (ref >=40)
LP-IR Score: <25 (ref <=45)
Small LDL Particle Number: 632 nmol/L — ABNORMAL HIGH (ref <=527)
Triglycerides by NMR: 102 mg/dL (ref <150)

## 2013-08-06 LAB — BMP8+EGFR
BUN/Creatinine Ratio: 16 (ref 11–26)
CO2: 24 mmol/L (ref 18–29)
Calcium: 9.7 mg/dL (ref 8.6–10.2)
Creatinine, Ser: 0.68 mg/dL (ref 0.57–1.00)
GFR calc non Af Amer: 81 mL/min/{1.73_m2} (ref >59)
Potassium: 5.5 mmol/L — ABNORMAL HIGH (ref 3.5–5.2)
Sodium: 139 mmol/L (ref 134–144)

## 2013-08-07 ENCOUNTER — Other Ambulatory Visit (INDEPENDENT_AMBULATORY_CARE_PROVIDER_SITE_OTHER): Payer: Medicare Other

## 2013-08-07 DIAGNOSIS — R7989 Other specified abnormal findings of blood chemistry: Secondary | ICD-10-CM

## 2013-08-07 NOTE — Progress Notes (Signed)
Patient came in for labs only.

## 2013-08-08 LAB — BMP8+EGFR
BUN/Creatinine Ratio: 14 (ref 11–26)
BUN: 9 mg/dL (ref 8–27)
Calcium: 9.2 mg/dL (ref 8.6–10.2)
Chloride: 100 mmol/L (ref 97–108)
GFR calc Af Amer: 94 mL/min/{1.73_m2} (ref >59)
GFR calc non Af Amer: 82 mL/min/{1.73_m2} (ref >59)
Glucose: 109 mg/dL — ABNORMAL HIGH (ref 65–99)

## 2013-08-14 NOTE — Progress Notes (Signed)
Patient notified of results.

## 2013-08-15 ENCOUNTER — Other Ambulatory Visit: Payer: Self-pay | Admitting: Gastroenterology

## 2013-08-20 ENCOUNTER — Other Ambulatory Visit (INDEPENDENT_AMBULATORY_CARE_PROVIDER_SITE_OTHER): Payer: Medicare Other

## 2013-08-20 DIAGNOSIS — D649 Anemia, unspecified: Secondary | ICD-10-CM | POA: Diagnosis not present

## 2013-08-20 DIAGNOSIS — R6889 Other general symptoms and signs: Secondary | ICD-10-CM | POA: Diagnosis not present

## 2013-08-20 LAB — IBC PANEL
Iron: 96 ug/dL (ref 42–145)
Saturation Ratios: 24 % (ref 20.0–50.0)
Transferrin: 286.3 mg/dL (ref 212.0–360.0)

## 2013-08-20 LAB — VITAMIN B12: Vitamin B-12: 860 pg/mL (ref 211–911)

## 2013-08-21 LAB — FOLATE: Folate: >24.8 ng/mL (ref >5.9)

## 2013-08-27 ENCOUNTER — Other Ambulatory Visit: Payer: Medicare Other

## 2013-08-27 ENCOUNTER — Ambulatory Visit: Payer: Medicare Other

## 2013-09-02 DIAGNOSIS — H35379 Puckering of macula, unspecified eye: Secondary | ICD-10-CM | POA: Diagnosis not present

## 2013-09-02 DIAGNOSIS — H43399 Other vitreous opacities, unspecified eye: Secondary | ICD-10-CM | POA: Diagnosis not present

## 2013-09-02 DIAGNOSIS — H35359 Cystoid macular degeneration, unspecified eye: Secondary | ICD-10-CM | POA: Diagnosis not present

## 2013-09-02 DIAGNOSIS — H43819 Vitreous degeneration, unspecified eye: Secondary | ICD-10-CM | POA: Diagnosis not present

## 2013-09-12 ENCOUNTER — Ambulatory Visit (INDEPENDENT_AMBULATORY_CARE_PROVIDER_SITE_OTHER): Payer: Medicare Other | Admitting: Nurse Practitioner

## 2013-09-12 ENCOUNTER — Encounter: Payer: Self-pay | Admitting: Nurse Practitioner

## 2013-09-12 VITALS — BP 131/80 | HR 80 | Temp 97.3°F | Ht 60.0 in | Wt 118.0 lb

## 2013-09-12 DIAGNOSIS — B351 Tinea unguium: Secondary | ICD-10-CM

## 2013-09-12 MED ORDER — ITRACONAZOLE 200 MG PO TABS: 1.0000 | ORAL_TABLET | Freq: Two times a day (BID) | ORAL | Status: DC

## 2013-09-12 NOTE — Progress Notes (Signed)
  Subjective:    Patient ID: Kristi Sawyer, female    DOB: [birth year], 79 y.o.   MRN: [id number]  HPI  patine tin c/o something wrong with her finger nail- noticed it Moday- as moved up nail some.    Review of Systems  All other systems reviewed and are negative.       Objective:   Physical Exam  Constitutional: She appears well-developed and well-nourished.  Cardiovascular: Normal rate, normal heart sounds and intact distal pulses.   Pulmonary/Chest: Effort normal and breath sounds normal.  Skin:  Right thunb nail thick and white down near cuticle- tender to touch   BP 131/80  Pulse 80  Temp(Src) 97.3 F (36.3 C) (Oral)  Ht 5' (1.524 m)  Wt 118 lb (53.524 kg)  BMI 23.05 kg/m2        Assessment & Plan:   1. Onychomycosis    Meds ordered this encounter  Medications  . Itraconazole 200 MG TABS    Sig: Take 1 tablet by mouth 2 (two) times daily. For 7 days- off for 21 days then repeat for 7 days    Dispense:  30 tablet    Refill:  0    Order Specific Question:  Supervising Provider    Answer:  Chipper Herb [[id number]]   Soak in epsom salt BID May loose nail- should grow back RTO prn  Mary-Margaret Hassell Done, FNP

## 2013-09-12 NOTE — Patient Instructions (Signed)
Ringworm, Nail A fungal infection of the nail (tinea unguium/onychomycosis) is common. It is common as the visible part of the nail is composed of dead cells which have no blood supply to help prevent infection. It occurs because fungi are everywhere and will pick any opportunity to grow on any dead material. Because nails are very slow growing they require up to 2 years of treatment with anti-fungal medications. The entire nail back to the base is infected. This includes approximately  of the nail which you cannot see. If your caregiver has prescribed a medication by mouth, take it every day and as directed. No progress will be seen for at least 6 to 9 months. Do not be disappointed! Because fungi live on dead cells with little or no exposure to blood supply, medication delivery to the infection is slow; thus the cure is slow. It is also why you can observe no progress in the first 6 months. The nail becoming cured is the base of the nail, as it has the blood supply. Topical medication such as creams and ointments are usually not effective. Important in successful treatment of nail fungus is closely following the medication regimen that your doctor prescribes. Sometimes you and your caregiver may elect to speed up this process by surgical removal of all the nails. Even this may still require 6 to 9 months of additional oral medications. See your caregiver as directed. Remember there will be no visible improvement for at least 6 months. See your caregiver sooner if other signs of infection (redness and swelling) develop. Document Released: 10/27/2000 Document Revised: 01/22/2012 Document Reviewed: 01/05/2009 [facility] Patient Information 2014 [place], [place].

## 2013-09-17 ENCOUNTER — Telehealth: Payer: Self-pay | Admitting: Nurse Practitioner

## 2013-09-23 MED ORDER — ITRACONAZOLE 200 MG PO TABS: 1.0000 | ORAL_TABLET | Freq: Every day | ORAL | Status: DC

## 2013-09-23 NOTE — Telephone Encounter (Signed)
Ins. Co has denied onmel because she has not tried generic, they have approved itraconazole x 3 months and if it works it has to be prior approved.  If it does not work onmel can be resubmitted to see if it will be covered.  Guess you need to write rx for the itraconazole for 3 mos.  Thanks!

## 2013-09-23 NOTE — Telephone Encounter (Signed)
Med changed to itraconazole 1 po qd X12 weeks

## 2013-09-26 ENCOUNTER — Other Ambulatory Visit: Payer: Self-pay | Admitting: Gastroenterology

## 2013-10-14 DIAGNOSIS — N318 Other neuromuscular dysfunction of bladder: Secondary | ICD-10-CM | POA: Diagnosis not present

## 2013-10-14 DIAGNOSIS — N3946 Mixed incontinence: Secondary | ICD-10-CM | POA: Diagnosis not present

## 2013-10-15 ENCOUNTER — Ambulatory Visit (INDEPENDENT_AMBULATORY_CARE_PROVIDER_SITE_OTHER): Payer: Medicare Other | Admitting: Pharmacist

## 2013-10-15 ENCOUNTER — Ambulatory Visit (INDEPENDENT_AMBULATORY_CARE_PROVIDER_SITE_OTHER): Payer: Medicare Other

## 2013-10-15 ENCOUNTER — Ambulatory Visit: Payer: PRIVATE HEALTH INSURANCE

## 2013-10-15 ENCOUNTER — Other Ambulatory Visit: Payer: Medicare Other

## 2013-10-15 VITALS — Ht 60.0 in | Wt 118.2 lb

## 2013-10-15 DIAGNOSIS — D509 Iron deficiency anemia, unspecified: Secondary | ICD-10-CM | POA: Diagnosis not present

## 2013-10-15 DIAGNOSIS — M899 Disorder of bone, unspecified: Secondary | ICD-10-CM

## 2013-10-15 NOTE — Progress Notes (Signed)
Patient ID: Kristi Sawyer, female   DOB: November 13, 1926, 79 y.o.   MRN: [id number]  [facility] Current Height: Height: 5' (152.4 cm)      Max Lifetime Height:  5'2" Current Weight: Weight: 118 lb 4 oz (53.638 kg)       Ethnicity:Caucasian    HPI: Does pt already have a diagnosis of:  Osteoporosis?  Yes  Back Pain?  No       Kyphosis?  No Prior fracture?  No Med(s) for Osteoporosis/Osteopenia:  reclast - started in 2011, last dose 04/02013 Med(s) previously tried for Osteoporosis/Osteopenia:  none                                                         PMH: Age at menopause:  79yo Hysterectomy?  Yes Oophorectomy?  No - 1 ovary remaining HRT? Yes - Former.  Type/duration:   Steroid Use?  No Thyroid med?  No History of cancer?  No History of digestive disorders (ie Crohn's)?  Yes - GERD on PPI Current or previous eating disorders?  No Last Vitamin D Result:  68 (11/2012) Last GFR Result:  82 (07/2013)   FH/SH: Family history of osteoporosis?  No Parent with history of hip fracture?  No Family history of breast cancer?  No Exercise?  Yes walking almost daily Smoking?  No Alcohol?  No    Calcium Assessment Calcium Intake  # of servings/day  Calcium mg  Milk (8 oz) 1  x  300  = 318m  Yogurt (4 oz) 0 x  200 = 0  Cheese (1 oz) 0 x  200 = 0  Other Calcium sources   2535m Ca supplement 6005mID and MVI = 1600m62mEstimated calcium intake per day 2150mg15mDEXA Results Date of Test T-Score for AP Spine L1-L4 T-Score for Total Left Hip T-Score for Total Right Hip  10/15/2013 0.2 -1.9 -1.5  10/11/2011 -0.1 -1.8 -1.5  09/22/2009 -0.6 -1.8 -1.7         T-Score for Left Neck of Femur was -2.7 today (10/15/2013)    Assessment: osteoporosis  Recommendations: 1.  Continue  reclast infused yearly - next due 02/2014 2.  recommend calcium 1200mg 76my through supplementation or diet.  (discussed decreased calcium supplement 3.  continue weight bearing exercise - 30  minutes at least 4 days  per week.   4.  Counseled and educated about fall risk and prevention.  Recheck DEXA:  1 year  Time spent counseling patient:  30 minutes  Dorianne Perret Cherre RobinsmD, CPP

## 2013-11-05 ENCOUNTER — Ambulatory Visit (INDEPENDENT_AMBULATORY_CARE_PROVIDER_SITE_OTHER): Payer: Medicare Other | Admitting: Family Medicine

## 2013-11-05 ENCOUNTER — Ambulatory Visit (INDEPENDENT_AMBULATORY_CARE_PROVIDER_SITE_OTHER): Payer: Medicare Other

## 2013-11-05 ENCOUNTER — Encounter: Payer: Self-pay | Admitting: Family Medicine

## 2013-11-05 VITALS — BP 127/71 | HR 79 | Temp 97.7°F | Ht 60.0 in | Wt 117.0 lb

## 2013-11-05 DIAGNOSIS — Z79899 Other long term (current) drug therapy: Secondary | ICD-10-CM | POA: Diagnosis not present

## 2013-11-05 DIAGNOSIS — D649 Anemia, unspecified: Secondary | ICD-10-CM | POA: Diagnosis not present

## 2013-11-05 DIAGNOSIS — Z23 Encounter for immunization: Secondary | ICD-10-CM

## 2013-11-05 DIAGNOSIS — K219 Gastro-esophageal reflux disease without esophagitis: Secondary | ICD-10-CM

## 2013-11-05 DIAGNOSIS — E559 Vitamin D deficiency, unspecified: Secondary | ICD-10-CM | POA: Diagnosis not present

## 2013-11-05 DIAGNOSIS — K469 Unspecified abdominal hernia without obstruction or gangrene: Secondary | ICD-10-CM

## 2013-11-05 DIAGNOSIS — E538 Deficiency of other specified B group vitamins: Secondary | ICD-10-CM | POA: Diagnosis not present

## 2013-11-05 DIAGNOSIS — M899 Disorder of bone, unspecified: Secondary | ICD-10-CM

## 2013-11-05 DIAGNOSIS — Z0000 Encounter for general adult medical examination without abnormal findings: Secondary | ICD-10-CM

## 2013-11-05 DIAGNOSIS — E785 Hyperlipidemia, unspecified: Secondary | ICD-10-CM | POA: Diagnosis not present

## 2013-11-05 NOTE — Patient Instructions (Addendum)
Continue current medications. Continue good therapeutic lifestyle changes which include good diet and exercise. Fall precautions discussed with patient. Schedule your flu vaccine if you haven't had it yet If you are over 77 years old - you may need Prevnar 4 or the adult Pneumonia vaccine. We will call you with the results of the lab work once these results are available

## 2013-11-05 NOTE — Progress Notes (Signed)
Subjective:    Patient ID: Kristi Sawyer, female    DOB: [birth year], 79 y.o.   MRN: [id number]  HPI Pt here for follow up and management of chronic medical problems. The patient is concerned about her anemia. She will have blood drawn today to followup on this from a gastroenterologist. She also get a chest x-ray today today.       Patient Active Problem List   Diagnosis Date Noted  . IRON DEFICIENCY 09/30/2010  . GERD 09/29/2010  . ABDOMINAL PAIN 09/29/2010  . HYPERLIPIDEMIA 09/26/2010  . ANXIETY 09/26/2010  . ESOPHAGEAL STRICTURE 09/26/2010  . GASTRITIS, CHRONIC 09/26/2010  . HIATAL HERNIA WITH REFLUX 09/26/2010  . CELIAC SPRUE 09/26/2010  . OSTEOPENIA 09/26/2010  . CHEST DISCOMFORT 08/31/2010   Outpatient Encounter Prescriptions as of 11/05/2013  Medication Sig  . aspirin (SB LOW DOSE ASA EC) 81 MG EC tablet Take 81 mg by mouth daily.    . Calcium Carbonate-Vit D-Min (CALTRATE PLUS PO) Take by mouth 2 (two) times daily.    . Cholecalciferol (VITAMIN D) 2000 UNITS CAPS Take by mouth as directed.   . Fe Fum-FePoly-Vit C-Vit B3 (INTEGRA) 62.5-62.5-40-3 MG CAPS TAKE 1 CAPSULE BY MOUTH ONCE DAILY  . FIBER PO Take by mouth as directed.  . metoCLOPramide (REGLAN) 10 MG tablet TAKE 1 TABLET (10 MG TOTAL) BY MOUTH AT BEDTIME.  . Multiple Vitamins-Minerals (CENTRUM SILVER PO) Take by mouth.    . Omega-3 Fatty Acids (FISH OIL) 1000 MG CAPS Take 3 capsules by mouth daily.   Marland Kitchen omeprazole (PRILOSEC) 40 MG capsule Take 1 capsule (40 mg total) by mouth daily.  Vladimir Faster Glycol-Propyl Glycol (SYSTANE OP) Apply to eye. 1- 2 drop   . rosuvastatin (CRESTOR) 5 MG tablet 5 mg. Take 17m MWF and 555mall other days  . Sennosides-Docusate Sodium (SENNA PLUS PO) Take by mouth as directed.  . trospium (SANCTURA) 20 MG tablet Take 20 mg by mouth 2 (two) times daily.  . Zoledronic Acid (RECLAST IV) Inject into the vein. 12/2009  . [DISCONTINUED] omeprazole (PRILOSEC OTC) 20 MG tablet Take 20 mg by  mouth daily. In the AM    Review of Systems  Constitutional: Negative.   HENT: Negative.   Eyes: Negative.   Respiratory: Negative.   Cardiovascular: Negative.   Gastrointestinal: Negative.   Endocrine: Negative.   Genitourinary: Negative.   Musculoskeletal: Negative.   Skin: Negative.   Allergic/Immunologic: Negative.   Neurological: Negative.   Hematological: Negative.   Psychiatric/Behavioral: Negative.        Objective:   Physical Exam  Nursing note and vitals reviewed. Constitutional: She is oriented to person, place, and time. She appears well-developed and well-nourished. No distress.  HENT:  Head: Normocephalic and atraumatic.  Right Ear: External ear normal.  Left Ear: External ear normal.  Mouth/Throat: Oropharynx is clear and moist.  Nasal congestion bilateral  Eyes: Conjunctivae and EOM are normal. Pupils are equal, round, and reactive to light. Right eye exhibits no discharge. Left eye exhibits no discharge. No scleral icterus.  Neck: Normal range of motion. Neck supple. No JVD present. No thyromegaly present.  No carotid bruits  Cardiovascular: Normal rate, regular rhythm, normal heart sounds and intact distal pulses.  Exam reveals no gallop and no friction rub.   No murmur heard. At 72 per minute  Pulmonary/Chest: Effort normal and breath sounds normal. No respiratory distress. She has no wheezes. She has no rales. She exhibits no tenderness.  Abdominal: Soft. Bowel sounds  are normal. She exhibits no mass. There is no tenderness. There is no rebound and no guarding.  Musculoskeletal: Normal range of motion. She exhibits no edema and no tenderness.  Lymphadenopathy:    She has no cervical adenopathy.  Neurological: She is alert and oriented to person, place, and time. She has normal reflexes. No cranial nerve deficit.  Skin: Skin is warm and dry.  Psychiatric: She has a normal mood and affect. Her behavior is normal. Judgment and thought content normal.    BP 127/71  Pulse 79  Temp(Src) 97.7 F (36.5 C) (Oral)  Ht 5' (1.524 m)  Wt 117 lb (53.071 kg)  BMI 22.85 kg/m2  [facility] reading (PRIMARY) by  Dr. Brunilda Payor x-ray--no active disease                                        Assessment & Plan:   1. GERD - Hepatic function panel  2. HYPERLIPIDEMIA - Hepatic function panel - BMP8+EGFR - NMR, lipoprofile  3. OSTEOPENIA - Vit D  25 hydroxy (rtn osteoporosis monitoring)  4. Anemia - Anemia Profile B  5. Healthcare maintenance - DG Chest 2 View; Future - Pneumococcal conjugate vaccine 13-valent  6. Vitamin D deficiency - Vit D  25 hydroxy (rtn osteoporosis monitoring)  Patient Instructions  Continue current medications. Continue good therapeutic lifestyle changes which include good diet and exercise. Fall precautions discussed with patient. Schedule your flu vaccine if you haven't had it yet If you are over 36 years old - you may need Prevnar 73 or the adult Pneumonia vaccine. We will call you with the results of the lab work once these results are available   She'll also receive her Prevnar vaccine today.  Arrie Senate MD

## 2013-11-08 ENCOUNTER — Other Ambulatory Visit: Payer: Self-pay | Admitting: Family Medicine

## 2013-11-08 LAB — BMP8+EGFR
BUN/Creatinine Ratio: 14 (ref 11–26)
BUN: 11 mg/dL (ref 8–27)
CO2: 27 mmol/L (ref 18–29)
Chloride: 99 mmol/L (ref 97–108)
GFR calc Af Amer: 80 mL/min/{1.73_m2} (ref >59)
GFR calc non Af Amer: 70 mL/min/{1.73_m2} (ref >59)
Glucose: 95 mg/dL (ref 65–99)
Potassium: 5.1 mmol/L (ref 3.5–5.2)
Sodium: 139 mmol/L (ref 134–144)

## 2013-11-08 LAB — HEPATIC FUNCTION PANEL
AST: 19 IU/L (ref 0–40)
Albumin: 4.3 g/dL (ref 3.5–4.7)
Alkaline Phosphatase: 45 IU/L (ref 39–117)
Bilirubin, Direct: 0.09 mg/dL (ref 0.00–0.40)
Total Bilirubin: 0.3 mg/dL (ref 0.0–1.2)
Total Protein: 6.1 g/dL (ref 6.0–8.5)

## 2013-11-08 LAB — VITAMIN D 25 HYDROXY (VIT D DEFICIENCY, FRACTURES): Vit D, 25-Hydroxy: 53.3 ng/mL (ref 30.0–100.0)

## 2013-11-08 LAB — NMR, LIPOPROFILE
Cholesterol: 148 mg/dL (ref <200)
HDL Cholesterol by NMR: 59 mg/dL (ref >=40)
HDL Particle Number: 37.2 umol/L (ref >=30.5)
LDL Particle Number: 1191 nmol/L — ABNORMAL HIGH (ref <1000)
LDL Size: 21.2 nm (ref >20.5)
LDLC SERPL CALC-MCNC: 71 mg/dL (ref <100)
LP-IR Score: <25 (ref <=45)
Small LDL Particle Number: 561 nmol/L — ABNORMAL HIGH (ref <=527)
Triglycerides by NMR: 91 mg/dL (ref <150)

## 2013-11-08 LAB — ANEMIA PROFILE B
Basophils Absolute: 0 10*3/uL (ref 0.0–0.2)
Basos: 0 %
Eos: 2 %
Folate: >19.9 ng/mL (ref >3.0)
HCT: 42.4 % (ref 34.0–46.6)
Hemoglobin: 14.1 g/dL (ref 11.1–15.9)
Immature Granulocytes: 0 %
Iron Saturation: 38 % (ref 15–55)
Iron: 122 ug/dL (ref 35–155)
Lymphs: 35 %
MCHC: 33.3 g/dL (ref 31.5–35.7)
MCV: 91 fL (ref 79–97)
Monocytes Absolute: 0.8 10*3/uL (ref 0.1–0.9)
Monocytes: 12 %
Neutrophils Absolute: 3.4 10*3/uL (ref 1.4–7.0)
RBC: 4.67 x10E6/uL (ref 3.77–5.28)
RDW: 13.7 % (ref 12.3–15.4)
Retic Ct Pct: 1.5 % (ref 0.6–2.6)
TIBC: 317 ug/dL (ref 250–450)
WBC: 6.7 10*3/uL (ref 3.4–10.8)

## 2013-11-12 ENCOUNTER — Other Ambulatory Visit: Payer: Self-pay | Admitting: Gastroenterology

## 2013-11-17 ENCOUNTER — Telehealth: Payer: Self-pay | Admitting: *Deleted

## 2013-11-17 NOTE — Telephone Encounter (Signed)
Dr Laurance Flatten would like patient to come by the office and let us look at her arm. She had a reaction to the prevnar vaccine last week

## 2013-11-27 NOTE — Addendum Note (Signed)
Addended by: Cherre Robins on: 11/27/2013 11:38 PM   Modules accepted: Level of Service

## 2013-12-15 ENCOUNTER — Other Ambulatory Visit: Payer: Self-pay | Admitting: Gastroenterology

## 2014-01-05 ENCOUNTER — Other Ambulatory Visit: Payer: Self-pay | Admitting: Gastroenterology

## 2014-01-09 ENCOUNTER — Other Ambulatory Visit: Payer: Self-pay | Admitting: Family Medicine

## 2014-01-09 ENCOUNTER — Other Ambulatory Visit: Payer: Self-pay | Admitting: Gastroenterology

## 2014-01-12 ENCOUNTER — Encounter: Payer: Self-pay | Admitting: *Deleted

## 2014-02-03 ENCOUNTER — Telehealth: Payer: Self-pay | Admitting: Pharmacist

## 2014-02-03 DIAGNOSIS — M810 Age-related osteoporosis without current pathological fracture: Secondary | ICD-10-CM

## 2014-02-03 NOTE — Telephone Encounter (Signed)
BMET need prior to Reclast referral (last Reclast 01/30/14). Patient called and will come in by the end of this week.

## 2014-02-06 ENCOUNTER — Other Ambulatory Visit (INDEPENDENT_AMBULATORY_CARE_PROVIDER_SITE_OTHER): Payer: Medicare Other

## 2014-02-06 DIAGNOSIS — M810 Age-related osteoporosis without current pathological fracture: Secondary | ICD-10-CM | POA: Diagnosis not present

## 2014-02-06 NOTE — Progress Notes (Signed)
Pt came in for labs only 

## 2014-02-07 LAB — BMP8+EGFR
BUN / CREAT RATIO: 8 — AB (ref 11–26)
BUN: 6 mg/dL — AB (ref 8–27)
CO2: 23 mmol/L (ref 18–29)
Calcium: 9.3 mg/dL (ref 8.7–10.3)
Chloride: 98 mmol/L (ref 97–108)
Creatinine, Ser: 0.72 mg/dL (ref 0.57–1.00)
GFR, EST AFRICAN AMERICAN: 88 mL/min/{1.73_m2} (ref >59)
GFR, EST NON AFRICAN AMERICAN: 77 mL/min/{1.73_m2} (ref >59)
GLUCOSE: 101 mg/dL — AB (ref 65–99)
Potassium: 4.8 mmol/L (ref 3.5–5.2)
Sodium: 139 mmol/L (ref 134–144)

## 2014-02-12 ENCOUNTER — Telehealth: Payer: Self-pay | Admitting: Family Medicine

## 2014-02-12 NOTE — Telephone Encounter (Signed)
Left message that labs were WNL - I have sent in referral to [facility] for her Reclast Infusion.  She can call anytime with in next month to schedule appt for infusion.

## 2014-02-17 DIAGNOSIS — N3946 Mixed incontinence: Secondary | ICD-10-CM | POA: Diagnosis not present

## 2014-02-23 ENCOUNTER — Telehealth: Payer: Self-pay | Admitting: Family Medicine

## 2014-02-23 NOTE — Telephone Encounter (Signed)
According to patient [facility] has not receive order yet. Order was faxed 02/12/14.  Order faxed again today.

## 2014-02-26 ENCOUNTER — Ambulatory Visit (INDEPENDENT_AMBULATORY_CARE_PROVIDER_SITE_OTHER): Payer: Medicare Other | Admitting: Family Medicine

## 2014-02-26 ENCOUNTER — Encounter: Payer: Self-pay | Admitting: Family Medicine

## 2014-02-26 VITALS — BP 125/87 | HR 68 | Temp 97.6°F | Ht 60.0 in | Wt 118.0 lb

## 2014-02-26 DIAGNOSIS — K219 Gastro-esophageal reflux disease without esophagitis: Secondary | ICD-10-CM

## 2014-02-26 DIAGNOSIS — E785 Hyperlipidemia, unspecified: Secondary | ICD-10-CM | POA: Diagnosis not present

## 2014-02-26 DIAGNOSIS — D509 Iron deficiency anemia, unspecified: Secondary | ICD-10-CM | POA: Diagnosis not present

## 2014-02-26 DIAGNOSIS — E559 Vitamin D deficiency, unspecified: Secondary | ICD-10-CM | POA: Diagnosis not present

## 2014-02-26 NOTE — Progress Notes (Signed)
Subjective:    Patient ID: Kristi Sawyer, female    DOB: 18-Sep-1926, 80 y.o.   MRN: [id number]  HPI Pt here for follow up and management of chronic medical problems.        Patient Active Problem List   Diagnosis Date Noted  . IRON DEFICIENCY 09/30/2010  . GERD 09/29/2010  . ABDOMINAL PAIN 09/29/2010  . HYPERLIPIDEMIA 09/26/2010  . ANXIETY 09/26/2010  . ESOPHAGEAL STRICTURE 09/26/2010  . GASTRITIS, CHRONIC 09/26/2010  . HIATAL HERNIA WITH REFLUX 09/26/2010  . CELIAC SPRUE 09/26/2010  . OSTEOPENIA 09/26/2010  . CHEST DISCOMFORT 08/31/2010   Outpatient Encounter Prescriptions as of 02/26/2014  Medication Sig  . aspirin (SB LOW DOSE ASA EC) 81 MG EC tablet Take 81 mg by mouth daily.    . Calcium Carbonate-Vit D-Min (CALTRATE PLUS PO) Take by mouth 2 (two) times daily.    . Cholecalciferol (VITAMIN D) 2000 UNITS CAPS Take by mouth as directed.   . CRESTOR 5 MG tablet TAKE 1 TABLET ON MON, WED, FRIDAY  . Fe Fum-FePoly-Vit C-Vit B3 (INTEGRA) 62.5-62.5-40-3 MG CAPS TAKE 1 CAPSULE BY MOUTH ONCE DAILY  . FIBER PO Take by mouth as directed.  . metoCLOPramide (REGLAN) 10 MG tablet TAKE 1 TABLET (10 MG TOTAL) BY MOUTH AT BEDTIME.  . Multiple Vitamins-Minerals (CENTRUM SILVER PO) Take by mouth.    . Omega-3 Fatty Acids (FISH OIL) 1000 MG CAPS Take 3 capsules by mouth daily.   Marland Kitchen omeprazole (PRILOSEC) 40 MG capsule Take 1 capsule (40 mg total) by mouth daily.  Vladimir Faster Glycol-Propyl Glycol (SYSTANE OP) Apply to eye. 1- 2 drop   . Sennosides-Docusate Sodium (SENNA PLUS PO) Take by mouth as directed.  . trospium (SANCTURA) 20 MG tablet Take 20 mg by mouth 2 (two) times daily.  . Zoledronic Acid (RECLAST IV) Inject into the vein. 12/2009  . [DISCONTINUED] Fe Fum-FePoly-Vit C-Vit B3 (INTEGRA) 62.5-62.5-40-3 MG CAPS TAKE 1 CAPSULE BY MOUTH ONCE DAILY  . [DISCONTINUED] Fe Fum-FePoly-Vit C-Vit B3 (INTEGRA) 62.5-62.5-40-3 MG CAPS TAKE 1 CAPSULE BY MOUTH ONCE DAILY  . [DISCONTINUED]  rosuvastatin (CRESTOR) 5 MG tablet 5 mg. Take 31m MWF and 541mall other days    Review of Systems  Constitutional: Negative.   HENT: Negative.   Eyes: Negative.   Respiratory: Negative.   Cardiovascular: Negative.   Gastrointestinal: Negative.   Endocrine: Negative.   Genitourinary: Negative.   Musculoskeletal: Negative.   Skin: Negative.   Allergic/Immunologic: Negative.   Neurological: Negative.   Hematological: Negative.   Psychiatric/Behavioral: Negative.        Objective:   Physical Exam  Nursing note and vitals reviewed. Constitutional: She is oriented to person, place, and time. She appears well-developed and well-nourished. No distress.  HENT:  Head: Normocephalic and atraumatic.  Right Ear: External ear normal.  Left Ear: External ear normal.  Nose: Nose normal.  Mouth/Throat: Oropharynx is clear and moist.  Eyes: Conjunctivae and EOM are normal. Pupils are equal, round, and reactive to light. Right eye exhibits no discharge. Left eye exhibits no discharge. No scleral icterus.  Neck: Normal range of motion. Neck supple. No thyromegaly present.  No carotid bruits  Cardiovascular: Normal rate, regular rhythm, normal heart sounds and intact distal pulses.  Exam reveals no gallop and no friction rub.   No murmur heard. At 84 per minute  Pulmonary/Chest: Effort normal and breath sounds normal. No respiratory distress. She has no wheezes. She has no rales.  Abdominal: Soft. Bowel sounds are normal.  She exhibits no mass. There is no tenderness. There is no rebound and no guarding.  Musculoskeletal: Normal range of motion. She exhibits no edema and no tenderness.  Lymphadenopathy:    She has no cervical adenopathy.  Neurological: She is alert and oriented to person, place, and time. She has normal reflexes.  Skin: Skin is warm and dry.  Psychiatric: She has a normal mood and affect. Her behavior is normal. Judgment and thought content normal.   BP 125/87  Pulse 68   Temp(Src) 97.6 F (36.4 C) (Oral)  Ht 5' (1.524 m)  Wt 118 lb (53.524 kg)  BMI 23.05 kg/m2        Assessment & Plan:   1. GERD - POCT CBC; Future  2. HYPERLIPIDEMIA - POCT CBC; Future - BMP8+EGFR; Future - Hepatic function panel; Future - NMR, lipoprofile; Future  3. IRON DEFICIENCY - POCT CBC; Future - Vit D  25 hydroxy (rtn osteoporosis monitoring); Future  4. Vitamin D deficiency - POCT CBC; Future - Vit D  25 hydroxy (rtn osteoporosis monitoring); Future  Patient Instructions                       Medicare Annual Wellness Visit  [facility] and the medical providers at [facility] strive to bring you the best medical care.  In doing so we not only want to address your current medical conditions and concerns but also to detect new conditions early and prevent illness, disease and health-related problems.    Medicare offers a yearly Wellness Visit which allows our clinical staff to assess your need for preventative services including immunizations, lifestyle education, counseling to decrease risk of preventable diseases and screening for fall risk and other medical concerns.    This visit is provided free of charge (no copay) for all Medicare recipients. The clinical pharmacists at [facility] have begun to conduct these Wellness Visits which will also include a thorough review of all your medications.    As you primary medical provider recommend that you make an appointment for your Annual Wellness Visit if you have not done so already this year.  You may set up this appointment before you leave today or you may call back [phone]) and schedule an appointment.  Please make sure when you call that you mention that you are scheduling your Annual Wellness Visit with the clinical pharmacist so that the appointment may be made for the proper length of time.       Continue current medications. Continue good therapeutic lifestyle  changes which include good diet and exercise. Fall precautions discussed with patient. If an FOBT was given today- please return it to our front desk. If you are over 69 years old - you may need Prevnar 38 or the adult Pneumonia vaccine.  Continue walking and exercise Drink plenty of fluids this summer   Arrie Senate MD

## 2014-02-26 NOTE — Patient Instructions (Addendum)
Medicare Annual Wellness Visit  [facility] and the medical providers at [facility] strive to bring you the best medical care.  In doing so we not only want to address your current medical conditions and concerns but also to detect new conditions early and prevent illness, disease and health-related problems.    Medicare offers a yearly Wellness Visit which allows our clinical staff to assess your need for preventative services including immunizations, lifestyle education, counseling to decrease risk of preventable diseases and screening for fall risk and other medical concerns.    This visit is provided free of charge (no copay) for all Medicare recipients. The clinical pharmacists at [facility] have begun to conduct these Wellness Visits which will also include a thorough review of all your medications.    As you primary medical provider recommend that you make an appointment for your Annual Wellness Visit if you have not done so already this year.  You may set up this appointment before you leave today or you may call back [phone]) and schedule an appointment.  Please make sure when you call that you mention that you are scheduling your Annual Wellness Visit with the clinical pharmacist so that the appointment may be made for the proper length of time.       Continue current medications. Continue good therapeutic lifestyle changes which include good diet and exercise. Fall precautions discussed with patient. If an FOBT was given today- please return it to our front desk. If you are over 41 years old - you may need Prevnar 29 or the adult Pneumonia vaccine.  Continue walking and exercise Drink plenty of fluids this summer

## 2014-03-02 ENCOUNTER — Ambulatory Visit: Payer: Medicare Other | Admitting: Family Medicine

## 2014-03-03 ENCOUNTER — Other Ambulatory Visit (INDEPENDENT_AMBULATORY_CARE_PROVIDER_SITE_OTHER): Payer: Medicare Other

## 2014-03-03 ENCOUNTER — Other Ambulatory Visit (HOSPITAL_COMMUNITY): Payer: Self-pay | Admitting: *Deleted

## 2014-03-03 DIAGNOSIS — K219 Gastro-esophageal reflux disease without esophagitis: Secondary | ICD-10-CM

## 2014-03-03 DIAGNOSIS — E559 Vitamin D deficiency, unspecified: Secondary | ICD-10-CM

## 2014-03-03 DIAGNOSIS — D509 Iron deficiency anemia, unspecified: Secondary | ICD-10-CM | POA: Diagnosis not present

## 2014-03-03 DIAGNOSIS — E785 Hyperlipidemia, unspecified: Secondary | ICD-10-CM | POA: Diagnosis not present

## 2014-03-03 LAB — POCT CBC
Granulocyte percent: 49.2 %G (ref 37–80)
HCT, POC: 42 % (ref 37.7–47.9)
HEMOGLOBIN: 14 g/dL (ref 12.2–16.2)
Lymph, poc: 3.4 (ref 0.6–3.4)
MCH, POC: 31 pg (ref 27–31.2)
MCHC: 33.3 g/dL (ref 31.8–35.4)
MCV: 93.3 fL (ref 80–97)
MPV: 8.7 fL (ref 0–99.8)
POC GRANULOCYTE: 3.7 (ref 2–6.9)
POC LYMPH PERCENT: 44.7 %L (ref 10–50)
Platelet Count, POC: 208 10*3/uL (ref 142–424)
RBC: 4.5 M/uL (ref 4.04–5.48)
RDW, POC: 13.1 %
WBC: 7.6 10*3/uL (ref 4.6–10.2)

## 2014-03-03 NOTE — Progress Notes (Signed)
Pt came in for labs only 

## 2014-03-04 ENCOUNTER — Encounter (HOSPITAL_COMMUNITY)
Admission: RE | Admit: 2014-03-04 | Discharge: 2014-03-04 | Disposition: A | Discharge status: Home / Self Care | Payer: Medicare Other | Source: Ambulatory Visit | Attending: Family Medicine | Admitting: Family Medicine

## 2014-03-04 DIAGNOSIS — M810 Age-related osteoporosis without current pathological fracture: Secondary | ICD-10-CM | POA: Insufficient documentation

## 2014-03-04 LAB — NMR, LIPOPROFILE
CHOLESTEROL: 147 mg/dL (ref <200)
HDL Cholesterol by NMR: 62 mg/dL (ref >=40)
HDL Particle Number: 35.9 umol/L (ref >=30.5)
LDL PARTICLE NUMBER: 944 nmol/L (ref <1000)
LDL SIZE: 20.8 nm (ref >20.5)
LDLC SERPL CALC-MCNC: 68 mg/dL (ref <100)
LP-IR Score: <25 (ref <=45)
Small LDL Particle Number: 328 nmol/L (ref <=527)
TRIGLYCERIDES BY NMR: 86 mg/dL (ref <150)

## 2014-03-04 LAB — BMP8+EGFR
BUN/Creatinine Ratio: 16 (ref 11–26)
BUN: 10 mg/dL (ref 8–27)
CALCIUM: 9.5 mg/dL (ref 8.7–10.3)
CHLORIDE: 101 mmol/L (ref 97–108)
CO2: 24 mmol/L (ref 18–29)
Creatinine, Ser: 0.64 mg/dL (ref 0.57–1.00)
GFR calc Af Amer: 94 mL/min/{1.73_m2} (ref >59)
GFR calc non Af Amer: 82 mL/min/{1.73_m2} (ref >59)
Glucose: 110 mg/dL — ABNORMAL HIGH (ref 65–99)
POTASSIUM: 4.6 mmol/L (ref 3.5–5.2)
Sodium: 140 mmol/L (ref 134–144)

## 2014-03-04 LAB — HEPATIC FUNCTION PANEL
ALT: 38 IU/L — AB (ref 0–32)
AST: 31 IU/L (ref 0–40)
Albumin: 4.1 g/dL (ref 3.5–4.7)
Alkaline Phosphatase: 46 IU/L (ref 39–117)
BILIRUBIN DIRECT: 0.08 mg/dL (ref 0.00–0.40)
Total Bilirubin: 0.3 mg/dL (ref 0.0–1.2)
Total Protein: 5.9 g/dL — ABNORMAL LOW (ref 6.0–8.5)

## 2014-03-04 LAB — VITAMIN D 25 HYDROXY (VIT D DEFICIENCY, FRACTURES): Vit D, 25-Hydroxy: 51.8 ng/mL (ref 30.0–100.0)

## 2014-03-04 MED ORDER — ZOLEDRONIC ACID 5 MG/100ML IV SOLN
5.0000 mg | Freq: Once | INTRAVENOUS | Status: AC
  Administered 2014-03-04: 5 mg via INTRAVENOUS

## 2014-03-04 MED ORDER — ZOLEDRONIC ACID 5 MG/100ML IV SOLN
INTRAVENOUS | Status: AC
  Administered 2014-03-04: 5 mg via INTRAVENOUS
  Filled 2014-03-04: qty 100

## 2014-03-04 NOTE — Discharge Instructions (Signed)
Zoledronic Acid injection (Paget's Disease, Osteoporosis) What is this medicine? ZOLEDRONIC ACID (ZOE le dron ik AS id) lowers the amount of calcium loss from bone. It is used to treat Paget's disease and osteoporosis in women. This medicine may be used for other purposes; ask your health care provider or pharmacist if you have questions. COMMON BRAND NAME(S): Reclast, Zometa What should I tell my health care provider before I take this medicine? They need to know if you have any of these conditions: -aspirin-sensitive asthma -cancer, especially if you are receiving medicines used to treat cancer -dental disease or wear dentures -infection -kidney disease -low levels of calcium in the blood -past surgery on the parathyroid gland or intestines -receiving corticosteroids like dexamethasone or prednisone -an unusual or allergic reaction to zoledronic acid, other medicines, foods, dyes, or preservatives -pregnant or trying to get pregnant -breast-feeding How should I use this medicine? This medicine is for infusion into a vein. It is given by a health care professional in a hospital or clinic setting. Talk to your pediatrician regarding the use of this medicine in children. This medicine is not approved for use in children. Overdosage: If you think you have taken too much of this medicine contact a poison control center or emergency room at once. NOTE: This medicine is only for you. Do not share this medicine with others. What if I miss a dose? It is important not to miss your dose. Call your doctor or health care professional if you are unable to keep an appointment. What may interact with this medicine? -certain antibiotics given by injection -NSAIDs, medicines for pain and inflammation, like ibuprofen or naproxen -some diuretics like bumetanide, furosemide -teriparatide This list may not describe all possible interactions. Give your health care provider a list of all the medicines,  herbs, non-prescription drugs, or dietary supplements you use. Also tell them if you smoke, drink alcohol, or use illegal drugs. Some items may interact with your medicine. What should I watch for while using this medicine? Visit your doctor or health care professional for regular checkups. It may be some time before you see the benefit from this medicine. Do not stop taking your medicine unless your doctor tells you to. Your doctor may order blood tests or other tests to see how you are doing. Women should inform their doctor if they wish to become pregnant or think they might be pregnant. There is a potential for serious side effects to an unborn child. Talk to your health care professional or pharmacist for more information. You should make sure that you get enough calcium and vitamin D while you are taking this medicine. Discuss the foods you eat and the vitamins you take with your health care professional. Some people who take this medicine have severe bone, joint, and/or muscle pain. This medicine may also increase your risk for jaw problems or a broken thigh bone. Tell your doctor right away if you have severe pain in your jaw, bones, joints, or muscles. Tell your doctor if you have any pain that does not go away or that gets worse. Tell your dentist and dental surgeon that you are taking this medicine. You should not have major dental surgery while on this medicine. See your dentist to have a dental exam and fix any dental problems before starting this medicine. Take good care of your teeth while on this medicine. Make sure you see your dentist for regular follow-up appointments. What side effects may I notice from receiving this medicine?  Side effects that you should report to your doctor or health care professional as soon as possible: -allergic reactions like skin rash, itching or hives, swelling of the face, lips, or tongue -anxiety, confusion, or depression -breathing problems -changes in  vision -eye pain -feeling faint or lightheaded, falls -jaw pain, especially after dental work -mouth sores -muscle cramps, stiffness, or weakness -trouble passing urine or change in the amount of urine Side effects that usually do not require medical attention (report to your doctor or health care professional if they continue or are bothersome): -bone, joint, or muscle pain -constipation -diarrhea -fever -hair loss -irritation at site where injected -loss of appetite -nausea, vomiting -stomach upset -trouble sleeping -trouble swallowing -weak or tired This list may not describe all possible side effects. Call your doctor for medical advice about side effects. You may report side effects to FDA at 1-800-FDA-1088. Where should I keep my medicine? This drug is given in a hospital or clinic and will not be stored at home. NOTE: This sheet is a summary. It may not cover all possible information. If you have questions about this medicine, talk to your doctor, pharmacist, or health care provider.  2014, [employer]. (2013-04-14 10:03:48)

## 2014-03-10 DIAGNOSIS — H35379 Puckering of macula, unspecified eye: Secondary | ICD-10-CM | POA: Diagnosis not present

## 2014-03-17 ENCOUNTER — Other Ambulatory Visit: Payer: Self-pay | Admitting: Gastroenterology

## 2014-03-17 NOTE — Telephone Encounter (Signed)
Tried to contact patient at home number, line was busy. Attempted three times. Denied RX because we need patient to call office back and make a follow up appointment so we can send in refill.

## 2014-03-18 ENCOUNTER — Other Ambulatory Visit: Payer: Self-pay | Admitting: Gastroenterology

## 2014-03-18 NOTE — Telephone Encounter (Signed)
Patient has follow up scheduled for July. Requesting Reglan until then. Okay to refill?

## 2014-03-18 NOTE — Telephone Encounter (Signed)
OK to refill to make it to follow-up

## 2014-03-20 ENCOUNTER — Other Ambulatory Visit: Payer: Self-pay | Admitting: Gastroenterology

## 2014-03-26 ENCOUNTER — Encounter: Payer: Self-pay | Admitting: Pharmacist

## 2014-03-26 ENCOUNTER — Ambulatory Visit (INDEPENDENT_AMBULATORY_CARE_PROVIDER_SITE_OTHER): Payer: Medicare Other | Admitting: Pharmacist

## 2014-03-26 ENCOUNTER — Ambulatory Visit: Payer: Medicare Other

## 2014-03-26 VITALS — BP 124/84 | HR 70 | Ht 61.25 in | Wt 118.0 lb

## 2014-03-26 DIAGNOSIS — M949 Disorder of cartilage, unspecified: Secondary | ICD-10-CM

## 2014-03-26 DIAGNOSIS — Z0000 Encounter for general adult medical examination without abnormal findings: Secondary | ICD-10-CM | POA: Diagnosis not present

## 2014-03-26 DIAGNOSIS — M899 Disorder of bone, unspecified: Secondary | ICD-10-CM

## 2014-03-26 NOTE — Progress Notes (Signed)
Subjective:    Kristi Sawyer is a 80 y.o. female who presents for Medicare Initial preventive examination.  Preventive Screening-Counseling & Management  Tobacco History  Smoking status  . Never Smoker   Smokeless tobacco  . Never Used    Current Problems (verified) Patient Active Problem List   Diagnosis Date Noted  . IRON DEFICIENCY 09/30/2010  . GERD 09/29/2010  . ABDOMINAL PAIN 09/29/2010  . HYPERLIPIDEMIA 09/26/2010  . ANXIETY 09/26/2010  . ESOPHAGEAL STRICTURE 09/26/2010  . GASTRITIS, CHRONIC 09/26/2010  . HIATAL HERNIA WITH REFLUX 09/26/2010  . CELIAC SPRUE 09/26/2010  . OSTEOPENIA 09/26/2010  . CHEST DISCOMFORT 08/31/2010    Medications Prior to Visit Current Outpatient Prescriptions on File Prior to Visit  Medication Sig Dispense Refill  . aspirin (SB LOW DOSE ASA EC) 81 MG EC tablet Take 81 mg by mouth daily.        . Calcium Carbonate-Vit D-Min (CALTRATE PLUS PO) Take by mouth 2 (two) times daily.        . Cholecalciferol (VITAMIN D) 2000 UNITS CAPS Take by mouth as directed.       . CRESTOR 5 MG tablet TAKE 1 TABLET ON MON, WED, FRIDAY  15 tablet  5  . Fe Fum-FePoly-Vit C-Vit B3 (INTEGRA) 62.5-62.5-40-3 MG CAPS TAKE 1 CAPSULE BY MOUTH ONCE DAILY  30 capsule  1  . FIBER PO Take by mouth as directed.      . metoCLOPramide (REGLAN) 10 MG tablet TAKE 1 TABLET (10 MG TOTAL) BY MOUTH AT BEDTIME.  90 tablet  0  . Multiple Vitamins-Minerals (CENTRUM SILVER PO) Take by mouth.        . Omega-3 Fatty Acids (FISH OIL) 1000 MG CAPS Take 3 capsules by mouth daily.       Marland Kitchen omeprazole (PRILOSEC) 40 MG capsule Take 1 capsule (40 mg total) by mouth daily.  90 capsule  6  . Polyethyl Glycol-Propyl Glycol (SYSTANE OP) Apply to eye. 1- 2 drop       . Sennosides-Docusate Sodium (SENNA PLUS PO) Take by mouth as directed.      . trospium (SANCTURA) 20 MG tablet Take 20 mg by mouth 2 (two) times daily.      . Zoledronic Acid (RECLAST IV) Inject into the vein. 12/2009       No current  facility-administered medications on file prior to visit.    Current Medications (verified) Current Outpatient Prescriptions  Medication Sig Dispense Refill  . aspirin (SB LOW DOSE ASA EC) 81 MG EC tablet Take 81 mg by mouth daily.        . Calcium Carbonate-Vit D-Min (CALTRATE PLUS PO) Take by mouth 2 (two) times daily.        . Cholecalciferol (VITAMIN D) 2000 UNITS CAPS Take by mouth as directed.       . CRESTOR 5 MG tablet TAKE 1 TABLET ON MON, WED, FRIDAY  15 tablet  5  . Fe Fum-FePoly-Vit C-Vit B3 (INTEGRA) 62.5-62.5-40-3 MG CAPS TAKE 1 CAPSULE BY MOUTH ONCE DAILY  30 capsule  1  . FIBER PO Take by mouth as directed.      . metoCLOPramide (REGLAN) 10 MG tablet TAKE 1 TABLET (10 MG TOTAL) BY MOUTH AT BEDTIME.  90 tablet  0  . Multiple Vitamins-Minerals (CENTRUM SILVER PO) Take by mouth.        . Omega-3 Fatty Acids (FISH OIL) 1000 MG CAPS Take 3 capsules by mouth daily.       Marland Kitchen omeprazole (PRILOSEC)  40 MG capsule Take 1 capsule (40 mg total) by mouth daily.  90 capsule  6  . Polyethyl Glycol-Propyl Glycol (SYSTANE OP) Apply to eye. 1- 2 drop       . Sennosides-Docusate Sodium (SENNA PLUS PO) Take by mouth as directed.      . trospium (SANCTURA) 20 MG tablet Take 20 mg by mouth 2 (two) times daily.      . Zoledronic Acid (RECLAST IV) Inject into the vein. 12/2009       No current facility-administered medications for this visit.     Allergies (verified) Codeine; Penicillins; Prevnar 13; Sulfonamide derivatives; and Tetanus toxoids   PAST HISTORY  Family History Family History  Problem Relation Age of Onset  . Healthy Mother   . Cancer Father     LUNG    Social History History  Substance Use Topics  . Smoking status: Never Smoker   . Smokeless tobacco: Never Used  . Alcohol Use: No     Are there smokers in your home (other than you)? No  Risk Factors Current exercise habits: Home exercise routine includes walking 0.5 hrs per day.  Dietary issues discussed: none  indicated   Cardiac risk factors: advanced age (older than 87 for men, 2 for women) and dyslipidemia.  Depression Screen (Note: if answer to either of the following is "Yes", a more complete depression screening is indicated)   Over the past 2 weeks, have you felt down, depressed or hopeless? No  Over the past 2 weeks, have you felt little interest or pleasure in doing things? No  Have you lost interest or pleasure in daily life? No  Do you often feel hopeless? No  Do you cry easily over simple problems? No  Activities of Daily Living In your present state of health, do you have any difficulty performing the following activities?:  Driving? No Managing money?  No Feeding yourself? No Getting from bed to chair? No Climbing a flight of stairs? No Preparing food and eating?: No Bathing or showering? No Getting dressed: No Getting to the toilet? No Using the toilet:No Moving around from place to place: No In the past year have you fallen or had a near fall?:No   Are you sexually active?  No  Do you have more than one partner?  No  Hearing Difficulties: No Do you often ask people to speak up or repeat themselves? No Do you experience ringing or noises in your ears? No Do you have difficulty understanding soft or whispered voices? No   Do you feel that you have a problem with memory? No  Do you often misplace items? No  Do you feel safe at home?  Yes  Cognitive Testing  Alert? Yes  Normal Appearance?Yes  Oriented to person? Yes  Place? Yes   Time? Yes  Recall of three objects?  Yes  Can perform simple calculations? Yes  Displays appropriate judgment?Yes  Can read the correct time from a watch face?Yes   Advanced Directives have been discussed with the patient? Yes  List the Names of Other Physician/Practitioners you currently use: 1.  Rankin - opthalmologist 2.  Jeffie Pollock - urologist 3.  Sharlett Iles - GI   Indicate any recent Medical Services you may have received from  other than [facility] providers in the past year (date may be approximate).  Immunization History  Administered Date(s) Administered  . Influenza,inj,Quad PF,36+ Mos 08/04/2013  . Pneumococcal Conjugate-13 11/05/2013    Screening Tests Health Maintenance  Topic Date  Due  . Colonoscopy  08/05/2023 (Originally 06/07/2013)  . Mammogram  03/27/2014  . Influenza Vaccine  06/13/2014  . Tetanus/tdap  06/04/2023  . Pneumococcal Polysaccharide Vaccine Age 83 And Over  Completed  . Zostavax  Completed   DEXA done 10/18/2013 Appt last month with Dr Zadie Rhine  All answers were reviewed with the patient and necessary referrals were made:  Cherre Robins, [facility]   03/26/2014    Objective:    Body mass index is 22.11 kg/(m^2). BP 124/84  Pulse 70  Ht 5' 1.25" (1.556 m)  Wt 118 lb (53.524 kg)  BMI 22.11 kg/m2   Assessment:     Annual Medicare Wellness Exam - all preventative services up to date except mammogram and patient has appt for that next week     Plan:     During the course of the visit the patient was educated and counseled about appropriate screening and preventive services including:    Pneumococcal vaccine   Influenza vaccine  Hepatitis B vaccine  Td vaccine  Screening mammography  Bone densitometry screening  Colorectal cancer screening  Diabetes screening  Glaucoma screening  Advanced directives: patient has living will and healthcare power of attorny and will bring to next appt.   Diet review for nutrition referral?  Not Indicated    Patient Instructions (the written plan) was given to the patient.  Medicare Attestation I have personally reviewed: The patient's medical and social history Their use of alcohol, tobacco or illicit drugs Their current medications and supplements The patient's functional ability including ADLs,fall risks, home safety risks, cognitive, and hearing and visual impairment Diet and physical activities Evidence for depression or  mood disorders  The patient's weight, height, BMI, and HR/BP have been recorded in the chart.  I have made referrals, counseling, and provided education to the patient based on review of the above and I have provided the patient with a written personalized care plan for preventive services.     Cherre Robins, [facility]   03/26/2014

## 2014-03-26 NOTE — Patient Instructions (Addendum)
Health Maintenance Summary    COLONOSCOPY Up to Date Not recommended to repeat     MAMMOGRAM Next Due 03/27/2014  (Appt set)  Last 03/2013    INFLUENZA VACCINE Next Due 06/13/2014  last fall 2014   DEXA / bone denisty Next Due  10/2015 Last 10/15/2013   Pneumonia Completed  10/2013   Zostavax / Shingles Vaccine Completed  12/2010    TETANUS/TDAP Not Indicated (reaction)          Preventive Care for Adults, Female A healthy lifestyle and preventive care can promote health and wellness. Preventive health guidelines for women include the following key practices.  A routine yearly physical is a good way to check with your health care provider about your health and preventive screening. It is a chance to share any concerns and updates on your health and to receive a thorough exam.  Visit your dentist for a routine exam and preventive care every 6 months. Brush your teeth twice a day and floss once a day. Good oral hygiene prevents tooth decay and gum disease.  The frequency of eye exams is based on your age, health, family medical history, use of contact lenses, and other factors. Follow your health care provider's recommendations for frequency of eye exams.  Eat a healthy diet. Foods like vegetables, fruits, whole grains, low-fat dairy products, and lean protein foods contain the nutrients you need without too many calories. Decrease your intake of foods high in solid fats, added sugars, and salt. Eat the right amount of calories for you.Get information about a proper diet from your health care provider, if necessary.  Regular physical exercise is one of the most important things you can do for your health. Most adults should get at least 150 minutes of moderate-intensity exercise (any activity that increases your heart rate and causes you to sweat) each week. In addition, most adults need muscle-strengthening exercises on 2 or more days a week.  Maintain a healthy weight. The body mass index  (BMI) is a screening tool to identify possible weight problems. It provides an estimate of body fat based on height and weight. Your health care provider can find your BMI, and can help you achieve or maintain a healthy weight.For adults 20 years and older:  A BMI below 18.5 is considered underweight.  A BMI of 18.5 to 24.9 is normal.  A BMI of 25 to 29.9 is considered overweight.  A BMI of 30 and above is considered obese.  Maintain normal blood lipids and cholesterol levels by exercising and minimizing your intake of saturated fat. Eat a balanced diet with plenty of fruit and vegetables. Blood tests for lipids and cholesterol should begin at age 2 and be repeated every 5 years. If your lipid or cholesterol levels are high, you are over 50, or you are at high risk for heart disease, you may need your cholesterol levels checked more frequently.Ongoing high lipid and cholesterol levels should be treated with medicines if diet and exercise are not working.  If you smoke, find out from your health care provider how to quit. If you do not use tobacco, do not start.  Lung cancer screening is recommended for adults aged 70 80 years who are at high risk for developing lung cancer because of a history of smoking. A yearly low-dose CT scan of the lungs is recommended for people who have at least a 30-pack-year history of smoking and are a current smoker or have quit within the past 15 years.  A pack year of smoking is smoking an average of 1 pack of cigarettes a day for 1 year (for example: 1 pack a day for 30 years or 2 packs a day for 15 years). Yearly screening should continue until the smoker has stopped smoking for at least 15 years. Yearly screening should be stopped for people who develop a health problem that would prevent them from having lung cancer treatment.  If you are pregnant, do not drink alcohol. If you are breastfeeding, be very cautious about drinking alcohol. If you are not pregnant and  choose to drink alcohol, do not have more than 1 drink per day. One drink is considered to be 12 ounces (355 mL) of beer, 5 ounces (148 mL) of wine, or 1.5 ounces (44 mL) of liquor.  Avoid use of street drugs. Do not share needles with anyone. Ask for help if you need support or instructions about stopping the use of drugs.  High blood pressure causes heart disease and increases the risk of stroke. Your blood pressure should be checked at least every 1 to 2 years. Ongoing high blood pressure should be treated with medicines if weight loss and exercise do not work.  If you are 25 78 years old, ask your health care provider if you should take aspirin to prevent strokes.  Diabetes screening involves taking a blood sample to check your fasting blood sugar level. This should be done once every 3 years, after age 38, if you are within normal weight and without risk factors for diabetes. Testing should be considered at a younger age or be carried out more frequently if you are overweight and have at least 1 risk factor for diabetes.  Breast cancer screening is essential preventive care for women. You should practice "breast self-awareness." This means understanding the normal appearance and feel of your breasts and may include breast self-examination. Any changes detected, no matter how small, should be reported to a health care provider. Women in their 48s and 30s should have a clinical breast exam (CBE) by a health care provider as part of a regular health exam every 1 to 3 years. After age 72, women should have a CBE every year. Starting at age 35, women should consider having a mammogram (breast X-ray test) every year. Women who have a family history of breast cancer should talk to their health care provider about genetic screening. Women at a high risk of breast cancer should talk to their health care providers about having an MRI and a mammogram every year.  Breast cancer gene (BRCA)-related cancer risk  assessment is recommended for women who have family members with BRCA-related cancers. BRCA-related cancers include breast, ovarian, tubal, and peritoneal cancers. Having family members with these cancers may be associated with an increased risk for harmful changes (mutations) in the breast cancer genes BRCA1 and BRCA2. Results of the assessment will determine the need for genetic counseling and BRCA1 and BRCA2 testing.  The Pap test is a screening test for cervical cancer. A Pap test can show cell changes on the cervix that might become cervical cancer if left untreated. A Pap test is a procedure in which cells are obtained and examined from the lower end of the uterus (cervix).  Women should have a Pap test starting at age 17.  Between ages 56 and 82, Pap tests should be repeated every 2 years.  Beginning at age 40, you should have a Pap test every 3 years as long as the past  3 Pap tests have been normal.  Some women have medical problems that increase the chance of getting cervical cancer. Talk to your health care provider about these problems. It is especially important to talk to your health care provider if a new problem develops soon after your last Pap test. In these cases, your health care provider may recommend more frequent screening and Pap tests.  The above recommendations are the same for women who have or have not gotten the vaccine for human papillomavirus (HPV).  If you had a hysterectomy for a problem that was not cancer or a condition that could lead to cancer, then you no longer need Pap tests. Even if you no longer need a Pap test, a regular exam is a good idea to make sure no other problems are starting.  If you are between ages 74 and 47 years, and you have had normal Pap tests going back 10 years, you no longer need Pap tests. Even if you no longer need a Pap test, a regular exam is a good idea to make sure no other problems are starting.  If you have had past treatment for  cervical cancer or a condition that could lead to cancer, you need Pap tests and screening for cancer for at least 20 years after your treatment.  If Pap tests have been discontinued, risk factors (such as a new sexual partner) need to be reassessed to determine if screening should be resumed.  The HPV test is an additional test that may be used for cervical cancer screening. The HPV test looks for the virus that can cause the cell changes on the cervix. The cells collected during the Pap test can be tested for HPV. The HPV test could be used to screen women aged 64 years and older, and should be used in women of any age who have unclear Pap test results. After the age of 22, women should have HPV testing at the same frequency as a Pap test.  Colorectal cancer can be detected and often prevented. Most routine colorectal cancer screening begins at the age of 30 years and continues through age 64 years. However, your health care provider may recommend screening at an earlier age if you have risk factors for colon cancer. On a yearly basis, your health care provider may provide home test kits to check for hidden blood in the stool. Use of a small camera at the end of a tube, to directly examine the colon (sigmoidoscopy or colonoscopy), can detect the earliest forms of colorectal cancer. Talk to your health care provider about this at age 43, when routine screening begins. Direct exam of the colon should be repeated every 5 10 years through age 47 years, unless early forms of pre-cancerous polyps or small growths are found.  People who are at an increased risk for hepatitis B should be screened for this virus. You are considered at high risk for hepatitis B if:  You were born in a country where hepatitis B occurs often. Talk with your health care provider about which countries are considered high risk.  Your parents were born in a high-risk country and you have not received a shot to protect against  hepatitis B (hepatitis B vaccine).  You have HIV or AIDS.  You use needles to inject street drugs.  You live with, or have sex with, someone who has Hepatitis B.  You get hemodialysis treatment.  You take certain medicines for conditions like cancer, organ transplantation, and  autoimmune conditions.  Hepatitis C blood testing is recommended for all people born from [birth year] through 1965 and any individual with known risks for hepatitis C.  Practice safe sex. Use condoms and avoid high-risk sexual practices to reduce the spread of sexually transmitted infections (STIs). STIs include gonorrhea, chlamydia, syphilis, trichomonas, herpes, HPV, and human immunodeficiency virus (HIV). Herpes, HIV, and HPV are viral illnesses that have no cure. They can result in disability, cancer, and death. Sexually active women aged 46 years and younger should be checked for chlamydia. Older women with new or multiple partners should also be tested for chlamydia. Testing for other STIs is recommended if you are sexually active and at increased risk.  Osteoporosis is a disease in which the bones lose minerals and strength with aging. This can result in serious bone fractures or breaks. The risk of osteoporosis can be identified using a bone density scan. Women ages 67 years and over and women at risk for fractures or osteoporosis should discuss screening with their health care providers. Ask your health care provider whether you should take a calcium supplement or vitamin D to reduce the rate of osteoporosis.  Menopause can be associated with physical symptoms and risks. Hormone replacement therapy is available to decrease symptoms and risks. You should talk to your health care provider about whether hormone replacement therapy is right for you.  Use sunscreen. Apply sunscreen liberally and repeatedly throughout the day. You should seek shade when your shadow is shorter than you. Protect yourself by wearing long sleeves,  pants, a wide-brimmed hat, and sunglasses year round, whenever you are outdoors.  Once a month, do a whole body skin exam, using a mirror to look at the skin on your back. Tell your health care provider of new moles, moles that have irregular borders, moles that are larger than a pencil eraser, or moles that have changed in shape or color.  Stay current with required vaccines (immunizations).  Influenza vaccine. All adults should be immunized every year.  Tetanus, diphtheria, and acellular pertussis (Td, Tdap) vaccine. Pregnant women should receive 1 dose of Tdap vaccine during each pregnancy. The dose should be obtained regardless of the length of time since the last dose. Immunization is preferred during the 27th 36th week of gestation. An adult who has not previously received Tdap or who does not know her vaccine status should receive 1 dose of Tdap. This initial dose should be followed by tetanus and diphtheria toxoids (Td) booster doses every 10 years. Adults with an unknown or incomplete history of completing a 3-dose immunization series with Td-containing vaccines should begin or complete a primary immunization series including a Tdap dose. Adults should receive a Td booster every 10 years.  Varicella vaccine. An adult without evidence of immunity to varicella should receive 2 doses or a second dose if she has previously received 1 dose. Pregnant females who do not have evidence of immunity should receive the first dose after pregnancy. This first dose should be obtained before leaving the health care facility. The second dose should be obtained 4 8 weeks after the first dose.  Zoster vaccine. One dose is recommended for adults aged 83 years or older unless certain conditions are present.  Measles, mumps, and rubella (MMR) vaccine. Adults born before [birth year] generally are considered immune to measles and mumps. Adults born in [birth year] or later should have 1 or more doses of MMR vaccine unless there is  a contraindication to the vaccine or there is laboratory evidence  of immunity to each of the three diseases. A routine second dose of MMR vaccine should be obtained at least 28 days after the first dose for students attending postsecondary schools, health care workers, or international travelers. People who received inactivated measles vaccine or an unknown type of measles vaccine during 1963 1967 should receive 2 doses of MMR vaccine. People who received inactivated mumps vaccine or an unknown type of mumps vaccine before 1979 and are at high risk for mumps infection should consider immunization with 2 doses of MMR vaccine. For females of childbearing age, rubella immunity should be determined. If there is no evidence of immunity, females who are not pregnant should be vaccinated. If there is no evidence of immunity, females who are pregnant should delay immunization until after pregnancy. Unvaccinated health care workers born before [birth year] who lack laboratory evidence of measles, mumps, or rubella immunity or laboratory confirmation of disease should consider measles and mumps immunization with 2 doses of MMR vaccine or rubella immunization with 1 dose of MMR vaccine.  Pneumococcal 13-valent conjugate (PCV13) vaccine. When indicated, a person who is uncertain of her immunization history and has no record of immunization should receive the PCV13 vaccine. An adult aged 85 years or older who has certain medical conditions and has not been previously immunized should receive 1 dose of PCV13 vaccine. This PCV13 should be followed with a dose of pneumococcal polysaccharide (PPSV23) vaccine. The PPSV23 vaccine dose should be obtained at least 8 weeks after the dose of PCV13 vaccine. An adult aged 39 years or older who has certain medical conditions and previously received 1 or more doses of PPSV23 vaccine should receive 1 dose of PCV13. The PCV13 vaccine dose should be obtained 1 or more years after the last PPSV23  vaccine dose.  Pneumococcal polysaccharide (PPSV23) vaccine. When PCV13 is also indicated, PCV13 should be obtained first. All adults aged 63 years and older should be immunized. An adult younger than age 73 years who has certain medical conditions should be immunized. Any person who resides in a nursing home or long-term care facility should be immunized. An adult smoker should be immunized. People with an immunocompromised condition and certain other conditions should receive both PCV13 and PPSV23 vaccines. People with human immunodeficiency virus (HIV) infection should be immunized as soon as possible after diagnosis. Immunization during chemotherapy or radiation therapy should be avoided. Routine use of PPSV23 vaccine is not recommended for American Indians, [place] Natives, or people younger than 65 years unless there are medical conditions that require PPSV23 vaccine. When indicated, people who have unknown immunization and have no record of immunization should receive PPSV23 vaccine. One-time revaccination 5 years after the first dose of PPSV23 is recommended for people aged 79 64 years who have chronic kidney failure, nephrotic syndrome, asplenia, or immunocompromised conditions. People who received 1 2 doses of PPSV23 before age 83 years should receive another dose of PPSV23 vaccine at age 40 years or later if at least 5 years have passed since the previous dose. Doses of PPSV23 are not needed for people immunized with PPSV23 at or after age 54 years.

## 2014-03-31 DIAGNOSIS — Z1231 Encounter for screening mammogram for malignant neoplasm of breast: Secondary | ICD-10-CM | POA: Diagnosis not present

## 2014-04-14 DIAGNOSIS — R351 Nocturia: Secondary | ICD-10-CM | POA: Diagnosis not present

## 2014-04-14 DIAGNOSIS — N318 Other neuromuscular dysfunction of bladder: Secondary | ICD-10-CM | POA: Diagnosis not present

## 2014-04-14 DIAGNOSIS — N3946 Mixed incontinence: Secondary | ICD-10-CM | POA: Diagnosis not present

## 2014-05-13 ENCOUNTER — Encounter: Payer: Self-pay | Admitting: Internal Medicine

## 2014-05-13 ENCOUNTER — Other Ambulatory Visit: Payer: Medicare Other

## 2014-05-13 ENCOUNTER — Ambulatory Visit (INDEPENDENT_AMBULATORY_CARE_PROVIDER_SITE_OTHER): Payer: Medicare Other | Admitting: Internal Medicine

## 2014-05-13 VITALS — BP 118/76 | HR 76 | Ht 60.0 in | Wt 115.1 lb

## 2014-05-13 DIAGNOSIS — K900 Celiac disease: Secondary | ICD-10-CM

## 2014-05-13 DIAGNOSIS — K219 Gastro-esophageal reflux disease without esophagitis: Secondary | ICD-10-CM

## 2014-05-13 NOTE — Assessment & Plan Note (Signed)
Things seem to be going well. She knows her diet though she is occasionally noncompliant. We will check tissue transglutaminase antibodies today. Return visit 1 year routinely, sooner as needed.

## 2014-05-13 NOTE — Patient Instructions (Signed)
Your physician has requested that you go to the basement for the following lab work before leaving today: TTG  Follow up with [place] as needed.   I appreciate the opportunity to care for you.

## 2014-05-13 NOTE — Assessment & Plan Note (Addendum)
Doing well without problems. She does not show any signs of tardive dyskinesia or other problems with the metoclopramide. Return in one year sooner if needed.

## 2014-05-13 NOTE — Progress Notes (Signed)
         Subjective:    Patient ID: Kristi Sawyer, female    DOB: 1926-05-03, 80 y.o.   MRN: [id number]  HPI The patient is a delightful elderly woman previously followed by Dr. Sharlett Iles for celiac disease and GERD. She reports no problems on her regimen of omeprazole, and nighttime Reglan. She tries to maintain a gluten-free diet but knows she had some chicken and dumplings earlier this week. She generally feels well she walks at least twice a day. She continues to drive and perform all functions of her activities of daily living.  Medications, allergies, past medical history, past surgical history, family history and social history are reviewed and updated in the EMR.  Review of Systems As above    Objective:   Physical Exam Elderly white woman in no acute distress    Assessment & Plan:   CELIAC SPRUE Things seem to be going well. She knows her diet though she is occasionally noncompliant. We will check tissue transglutaminase antibodies today. Return visit 1 year routinely, sooner as needed.  GERD (gastroesophageal reflux disease) Doing well without problems. She does not show any signs of tardive dyskinesia or other problems with the metoclopramide. Return in one year sooner if needed.

## 2014-05-14 LAB — TISSUE TRANSGLUTAMINASE, IGA: Tissue Transglutaminase Ab, IgA: 33.7 U/mL — ABNORMAL HIGH (ref <20)

## 2014-05-14 NOTE — Progress Notes (Signed)
Quick Note:  Celiac testing shows some gluten exposure - we know she had some lately Keep trying to avoid gluten  See me 1 year as planned ______

## 2014-05-25 ENCOUNTER — Other Ambulatory Visit: Payer: Self-pay | Admitting: Gastroenterology

## 2014-06-08 DIAGNOSIS — H35379 Puckering of macula, unspecified eye: Secondary | ICD-10-CM | POA: Diagnosis not present

## 2014-06-08 DIAGNOSIS — H40019 Open angle with borderline findings, low risk, unspecified eye: Secondary | ICD-10-CM | POA: Diagnosis not present

## 2014-06-11 ENCOUNTER — Other Ambulatory Visit: Payer: Self-pay | Admitting: Internal Medicine

## 2014-06-16 DIAGNOSIS — Z961 Presence of intraocular lens: Secondary | ICD-10-CM | POA: Diagnosis not present

## 2014-06-19 ENCOUNTER — Telehealth: Payer: Self-pay | Admitting: Family Medicine

## 2014-06-20 NOTE — Telephone Encounter (Signed)
Patient aware no samples

## 2014-07-07 ENCOUNTER — Encounter: Payer: Self-pay | Admitting: Nurse Practitioner

## 2014-07-07 ENCOUNTER — Ambulatory Visit (INDEPENDENT_AMBULATORY_CARE_PROVIDER_SITE_OTHER): Payer: Medicare Other | Admitting: Nurse Practitioner

## 2014-07-07 VITALS — BP 142/78 | HR 78 | Temp 97.7°F | Ht 60.0 in | Wt 116.6 lb

## 2014-07-07 DIAGNOSIS — Z01419 Encounter for gynecological examination (general) (routine) without abnormal findings: Secondary | ICD-10-CM

## 2014-07-07 LAB — POCT URINALYSIS DIPSTICK
BILIRUBIN UA: NEGATIVE
Blood, UA: NEGATIVE
GLUCOSE UA: NEGATIVE
KETONES UA: NEGATIVE
Leukocytes, UA: NEGATIVE
Nitrite, UA: NEGATIVE
Protein, UA: NEGATIVE
Urobilinogen, UA: NEGATIVE
pH, UA: 6.5

## 2014-07-07 LAB — POCT UA - MICROSCOPIC ONLY
Bacteria, U Microscopic: NEGATIVE
CASTS, UR, LPF, POC: NEGATIVE
CRYSTALS, UR, HPF, POC: NEGATIVE
MUCUS UA: NEGATIVE
RBC, URINE, MICROSCOPIC: NEGATIVE
WBC, UR, HPF, POC: NEGATIVE
YEAST UA: NEGATIVE

## 2014-07-07 NOTE — Progress Notes (Signed)
   Subjective:    Patient ID: Kristi Sawyer, female    DOB: Apr 05, 1926, 80 y.o.   MRN: [id number]  HPI Patient is a regular patient of Dr. Laurance Flatten. Had follow up visit 02/2014- She was sent by him today for pelvic exam only. She is doing well without complaints.    Review of Systems  Constitutional: Negative.   Respiratory: Negative.  Negative for shortness of breath.   Cardiovascular: Negative.  Negative for chest pain.  Gastrointestinal: Negative.   Skin: Negative.   Psychiatric/Behavioral: Negative.        Objective:   Physical Exam  Constitutional: She is oriented to person, place, and time. She appears well-developed and well-nourished.  Cardiovascular: Normal rate, regular rhythm and normal heart sounds.   Pulmonary/Chest: Effort normal and breath sounds normal.  Abdominal: Soft. Bowel sounds are normal.  Genitourinary:  Vaginal cuff intact No adnexal mass or tenderness  Neurological: She is alert and oriented to person, place, and time.  Skin: Skin is warm and dry.  Psychiatric: She has a normal mood and affect. Her behavior is normal. Judgment and thought content normal.   BP 142/78  Pulse 78  Temp(Src) 97.7 F (36.5 C) (Oral)  Ht 5' (1.524 m)  Wt 116 lb 9.6 oz (52.889 kg)  BMI 22.77 kg/m2        Assessment & Plan:   1. Encounter for routine gynecological examination    hemoccult cards given to patient with directions Keep follow up appointment with dr.Moore  Mary-Margaret Hassell Done, FNP

## 2014-07-07 NOTE — Patient Instructions (Signed)

## 2014-07-13 ENCOUNTER — Other Ambulatory Visit: Payer: Medicare Other

## 2014-07-13 DIAGNOSIS — Z1212 Encounter for screening for malignant neoplasm of rectum: Secondary | ICD-10-CM

## 2014-07-13 NOTE — Progress Notes (Signed)
Pt dropped off urine

## 2014-07-14 ENCOUNTER — Telehealth: Payer: Self-pay | Admitting: Family Medicine

## 2014-07-14 DIAGNOSIS — N3946 Mixed incontinence: Secondary | ICD-10-CM | POA: Diagnosis not present

## 2014-07-14 DIAGNOSIS — N318 Other neuromuscular dysfunction of bladder: Secondary | ICD-10-CM | POA: Diagnosis not present

## 2014-07-14 LAB — FECAL OCCULT BLOOD, IMMUNOCHEMICAL: Fecal Occult Bld: NEGATIVE

## 2014-07-14 NOTE — Telephone Encounter (Signed)
Pt notified no samples available at this time.

## 2014-07-18 ENCOUNTER — Other Ambulatory Visit: Payer: Self-pay | Admitting: Internal Medicine

## 2014-07-25 ENCOUNTER — Other Ambulatory Visit: Payer: Self-pay | Admitting: Gastroenterology

## 2014-07-30 ENCOUNTER — Encounter: Payer: Self-pay | Admitting: *Deleted

## 2014-07-31 ENCOUNTER — Other Ambulatory Visit: Payer: Self-pay | Admitting: Family Medicine

## 2014-08-18 ENCOUNTER — Ambulatory Visit (INDEPENDENT_AMBULATORY_CARE_PROVIDER_SITE_OTHER): Payer: Medicare Other

## 2014-08-18 DIAGNOSIS — Z23 Encounter for immunization: Secondary | ICD-10-CM

## 2014-09-07 ENCOUNTER — Other Ambulatory Visit: Payer: Self-pay | Admitting: Internal Medicine

## 2014-09-14 ENCOUNTER — Other Ambulatory Visit: Payer: Self-pay

## 2014-09-14 MED ORDER — INTEGRA 62.5-62.5-40-3 MG PO CAPS: ORAL_CAPSULE | ORAL | Status: DC

## 2014-10-06 ENCOUNTER — Ambulatory Visit (INDEPENDENT_AMBULATORY_CARE_PROVIDER_SITE_OTHER): Payer: Medicare Other | Admitting: Family Medicine

## 2014-10-06 ENCOUNTER — Encounter: Payer: Self-pay | Admitting: Family Medicine

## 2014-10-06 ENCOUNTER — Ambulatory Visit (INDEPENDENT_AMBULATORY_CARE_PROVIDER_SITE_OTHER): Payer: Medicare Other

## 2014-10-06 VITALS — BP 109/67 | HR 78 | Temp 97.1°F | Ht 60.0 in | Wt 113.0 lb

## 2014-10-06 DIAGNOSIS — M25512 Pain in left shoulder: Secondary | ICD-10-CM | POA: Diagnosis not present

## 2014-10-06 DIAGNOSIS — S40012A Contusion of left shoulder, initial encounter: Secondary | ICD-10-CM | POA: Diagnosis not present

## 2014-10-06 DIAGNOSIS — K219 Gastro-esophageal reflux disease without esophagitis: Secondary | ICD-10-CM | POA: Diagnosis not present

## 2014-10-06 DIAGNOSIS — E785 Hyperlipidemia, unspecified: Secondary | ICD-10-CM | POA: Diagnosis not present

## 2014-10-06 DIAGNOSIS — E039 Hypothyroidism, unspecified: Secondary | ICD-10-CM

## 2014-10-06 DIAGNOSIS — E559 Vitamin D deficiency, unspecified: Secondary | ICD-10-CM

## 2014-10-06 DIAGNOSIS — IMO0001 Reserved for inherently not codable concepts without codable children: Secondary | ICD-10-CM

## 2014-10-06 LAB — POCT CBC
Granulocyte percent: 56 % (ref 37–80)
HCT, POC: 42.6 % (ref 37.7–47.9)
Hemoglobin: 14.3 g/dL (ref 12.2–16.2)
Lymph, poc: 4.1 — AB (ref 0.6–3.4)
MCH, POC: 30.8 pg (ref 27–31.2)
MCHC: 33.7 g/dL (ref 31.8–35.4)
MCV: 91.5 fL (ref 80–97)
MPV: 8.6 fL (ref 0–99.8)
POC Granulocyte: 6 (ref 2–6.9)
POC LYMPH PERCENT: 38.2 % (ref 10–50)
Platelet Count, POC: 211 K/uL (ref 142–424)
RBC: 4.7 M/uL (ref 4.04–5.48)
RDW, POC: 12.7 %
WBC: 10.7 K/uL — AB (ref 4.6–10.2)

## 2014-10-06 NOTE — Progress Notes (Signed)
Subjective:    Patient ID: Kristi Sawyer, female    DOB: [birth year], 80 y.o.   MRN: [id number]  HPI Pt here for follow up and management of chronic medical problems. The patient does complain of some arthralgias and also a bruise to the left shoulder for which she does not remember injuring. The patient brings a living will to be placed in her medical record.         Patient Active Problem List   Diagnosis Date Noted  . HYPERLIPIDEMIA 09/26/2010  . ANXIETY 09/26/2010  . GERD (gastroesophageal reflux disease) 09/26/2010  . CELIAC SPRUE 09/26/2010  . OSTEOPENIA 09/26/2010   Outpatient Encounter Prescriptions as of 10/06/2014  Medication Sig  . aspirin (SB LOW DOSE ASA EC) 81 MG EC tablet Take 81 mg by mouth daily.    . Calcium Carbonate-Vit D-Min (CALTRATE PLUS PO) Take by mouth 2 (two) times daily.    . Cholecalciferol (VITAMIN D) 2000 UNITS CAPS Take by mouth as directed.   . CRESTOR 5 MG tablet TAKE 1 TABLET ON MON, WED, FRIDAY  . Fe Fum-FePoly-Vit C-Vit B3 (INTEGRA) 62.5-62.5-40-3 MG CAPS TAKE 1 CAPSULE BY MOUTH ONCE DAILY  . Fe Fum-FePoly-Vit C-Vit B3 (INTEGRA) 62.5-62.5-40-3 MG CAPS TAKE 1 CAPSULE BY MOUTH ONCE DAILY  . FIBER PO Take by mouth as directed.  . metoCLOPramide (REGLAN) 10 MG tablet TAKE 1 TABLET BY MOUTH AT BEDTIME  . mirabegron ER (MYRBETRIQ) 50 MG TB24 tablet Take 50 mg by mouth daily.  . Multiple Vitamins-Minerals (CENTRUM SILVER PO) Take by mouth.    . Omega-3 Fatty Acids (FISH OIL) 1000 MG CAPS Take 3 capsules by mouth daily.   Marland Kitchen omeprazole (PRILOSEC) 40 MG capsule TAKE 1 CAPSULE (40 MG TOTAL) BY MOUTH DAILY.  Marland Kitchen Omeprazole Magnesium (PRILOSEC OTC PO) Take 20.6 mg by mouth every evening.  Vladimir Faster Glycol-Propyl Glycol (SYSTANE OP) Apply to eye. 1- 2 drop   . rosuvastatin (CRESTOR) 5 MG tablet TAKE 12m ON MON, WED, FRIDAY AND 5 MG THE OTHER DAYS  . Sennosides-Docusate Sodium (SENNA PLUS PO) Take by mouth as directed.  . Zoledronic Acid (RECLAST IV)  Inject into the vein. 12/2009    Review of Systems  Constitutional: Negative.   HENT: Negative.   Eyes: Negative.   Respiratory: Negative.   Cardiovascular: Negative.   Gastrointestinal: Negative.   Endocrine: Negative.   Genitourinary: Negative.   Musculoskeletal: Positive for arthralgias (left shoulder bruise and pain - no injury).  Skin: Negative.   Allergic/Immunologic: Negative.   Neurological: Negative.   Hematological: Negative.   Psychiatric/Behavioral: Negative.        Objective:   Physical Exam  Constitutional: She is oriented to person, place, and time. She appears well-developed and well-nourished.  HENT:  Head: Normocephalic and atraumatic.  Right Ear: External ear normal.  Left Ear: External ear normal.  Nose: Nose normal.  Mouth/Throat: Oropharynx is clear and moist.  Eyes: Conjunctivae and EOM are normal. Pupils are equal, round, and reactive to light. Right eye exhibits no discharge. Left eye exhibits no discharge. No scleral icterus.  Neck: Normal range of motion. Neck supple. No thyromegaly present.  No carotid bruits or anterior cervical adenopathy  Cardiovascular: Normal rate, regular rhythm, normal heart sounds and intact distal pulses.  Exam reveals no gallop and no friction rub.   No murmur heard. Rhythm is regular at 72/m  Pulmonary/Chest: Effort normal and breath sounds normal. No respiratory distress. She has no wheezes. She has no rales. She  exhibits no tenderness.  There are no axillary nodes.  Abdominal: Soft. Bowel sounds are normal. She exhibits no mass. There is no tenderness. There is no rebound and no guarding.  There is no epigastric tenderness.  Musculoskeletal: Normal range of motion. She exhibits no edema or tenderness.  Lymphadenopathy:    She has no cervical adenopathy.  Neurological: She is alert and oriented to person, place, and time.  Skin: Skin is warm and dry. No rash noted.  There is a bruise over the left acromioclavicular  joint that is tender and uncomfortable with moving her arm. There are no other bruises on any other part of the body.  Psychiatric: She has a normal mood and affect. Her behavior is normal. Judgment and thought content normal.  Nursing note and vitals reviewed.  BP 109/67 mmHg  Pulse 78  Temp(Src) 97.1 F (36.2 C) (Oral)  Ht 5' (1.524 m)  Wt 113 lb (51.256 kg)  BMI 22.07 kg/m2  [facility] reading (PRIMARY) by  Dr. Louretta Parma shoulder--  left acromioclavicular joint arthritis                                     Assessment & Plan:  1. Gastroesophageal reflux disease, esophagitis presence not specified - POCT CBC - Hepatic function panel - Lipid panel  2. Vitamin D deficiency - POCT CBC - Vit D  25 hydroxy (rtn osteoporosis monitoring)  3. Hyperlipidemia - POCT CBC - BMP8+EGFR - Hepatic function panel - Lipid panel  4. Euthyroid - POCT CBC  5. Left shoulder pain - DG Shoulder Left; Future  6. Contusion of left shoulder, initial encounter  No orders of the defined types were placed in this encounter.   Patient Instructions  Use warm wet compresses to left shoulder and gentle range of motion exercises, moist heat 20 minutes 3 or 4 times daily Try to leave the bra off when at home Avoid heavy lifting for a few days which you did not need to be doing anyway Continue aggressive therapeutic lifestyle changes which include diet and exercise and walking Continue current medication Keep the house as cool as possible and drink plenty of fluids this winter Clifton James continue to be careful and did not put herself at risk for falling, don't climb.   Arrie Senate MD

## 2014-10-06 NOTE — Patient Instructions (Signed)
Use warm wet compresses to left shoulder and gentle range of motion exercises, moist heat 20 minutes 3 or 4 times daily Try to leave the bra off when at home Avoid heavy lifting for a few days which you did not need to be doing anyway Continue aggressive therapeutic lifestyle changes which include diet and exercise and walking Continue current medication Keep the house as cool as possible and drink plenty of fluids this winter Kristi Sawyer continue to be careful and did not put herself at risk for falling, don't climb.

## 2014-10-07 LAB — BMP8+EGFR
BUN/Creatinine Ratio: 9 — ABNORMAL LOW (ref 11–26)
BUN: 7 mg/dL — AB (ref 8–27)
CO2: 25 mmol/L (ref 18–29)
CREATININE: 0.75 mg/dL (ref 0.57–1.00)
Calcium: 9.5 mg/dL (ref 8.7–10.3)
Chloride: 98 mmol/L (ref 97–108)
GFR calc non Af Amer: 73 mL/min/{1.73_m2} (ref >59)
GFR, EST AFRICAN AMERICAN: 84 mL/min/{1.73_m2} (ref >59)
Glucose: 93 mg/dL (ref 65–99)
Potassium: 4.3 mmol/L (ref 3.5–5.2)
Sodium: 139 mmol/L (ref 134–144)

## 2014-10-07 LAB — LIPID PANEL
CHOL/HDL RATIO: 2.3 ratio (ref 0.0–4.4)
Cholesterol, Total: 163 mg/dL (ref 100–199)
HDL: 70 mg/dL (ref >39)
LDL Calculated: 72 mg/dL (ref 0–99)
Triglycerides: 106 mg/dL (ref 0–149)
VLDL CHOLESTEROL CAL: 21 mg/dL (ref 5–40)

## 2014-10-07 LAB — HEPATIC FUNCTION PANEL
ALBUMIN: 4.1 g/dL (ref 3.5–4.7)
ALT: 16 IU/L (ref 0–32)
AST: 18 IU/L (ref 0–40)
Alkaline Phosphatase: 47 IU/L (ref 39–117)
BILIRUBIN TOTAL: 0.2 mg/dL (ref 0.0–1.2)
Bilirubin, Direct: 0.06 mg/dL (ref 0.00–0.40)
Total Protein: 6.3 g/dL (ref 6.0–8.5)

## 2014-10-07 LAB — VITAMIN D 25 HYDROXY (VIT D DEFICIENCY, FRACTURES): VIT D 25 HYDROXY: 62.1 ng/mL (ref 30.0–100.0)

## 2014-11-07 ENCOUNTER — Other Ambulatory Visit: Payer: Self-pay | Admitting: Internal Medicine

## 2014-12-14 ENCOUNTER — Other Ambulatory Visit: Payer: Self-pay | Admitting: Internal Medicine

## 2015-01-03 ENCOUNTER — Other Ambulatory Visit: Payer: Self-pay | Admitting: Internal Medicine

## 2015-01-13 ENCOUNTER — Other Ambulatory Visit: Payer: Self-pay | Admitting: Family Medicine

## 2015-01-19 ENCOUNTER — Other Ambulatory Visit: Payer: Self-pay | Admitting: Internal Medicine

## 2015-02-04 ENCOUNTER — Telehealth: Payer: Self-pay | Admitting: Pharmacist

## 2015-02-04 NOTE — Telephone Encounter (Signed)
Patient is due to have Reclast 03/05/15.  Need BMET prior to appt - sees Dr Laurance Flatten 02/15/15 - will get then. Also appt made for AWV for 04/02/2015.

## 2015-02-15 ENCOUNTER — Ambulatory Visit (INDEPENDENT_AMBULATORY_CARE_PROVIDER_SITE_OTHER): Payer: Medicare Other | Admitting: Family Medicine

## 2015-02-15 ENCOUNTER — Encounter: Payer: Self-pay | Admitting: Family Medicine

## 2015-02-15 VITALS — BP 131/78 | HR 80 | Temp 98.9°F | Ht 60.0 in | Wt 113.0 lb

## 2015-02-15 DIAGNOSIS — E785 Hyperlipidemia, unspecified: Secondary | ICD-10-CM

## 2015-02-15 DIAGNOSIS — E039 Hypothyroidism, unspecified: Secondary | ICD-10-CM | POA: Diagnosis not present

## 2015-02-15 DIAGNOSIS — K219 Gastro-esophageal reflux disease without esophagitis: Secondary | ICD-10-CM

## 2015-02-15 DIAGNOSIS — E559 Vitamin D deficiency, unspecified: Secondary | ICD-10-CM | POA: Diagnosis not present

## 2015-02-15 DIAGNOSIS — IMO0001 Reserved for inherently not codable concepts without codable children: Secondary | ICD-10-CM

## 2015-02-15 NOTE — Addendum Note (Signed)
Addended by: Earlene Plater on: 02/15/2015 03:44 PM   Modules accepted: Miquel Dunn

## 2015-02-15 NOTE — Progress Notes (Signed)
Subjective:    Patient ID: Kristi Sawyer, female    DOB: [birth year], 82 y.o.   MRN: [id number]  HPI Pt here for follow up and management of chronic medical problems which includes hyperlipidemia and GERD. She is taking medications regularly. The patient is doing well today with no specific complaints. She does not need any refills on her medication and he'll she is up-to-date on all her health maintenance issues. She is due to get lab work today. She did have a recent fall in February and skinned her legs but is doing better with no other problems secondary to this. She continues to walk daily. She denies chest pain shortness of breath or GI issues.        Patient Active Problem List   Diagnosis Date Noted  . HYPERLIPIDEMIA 09/26/2010  . ANXIETY 09/26/2010  . GERD (gastroesophageal reflux disease) 09/26/2010  . CELIAC SPRUE 09/26/2010  . OSTEOPENIA 09/26/2010   Outpatient Encounter Prescriptions as of 02/15/2015  Medication Sig  . aspirin (SB LOW DOSE ASA EC) 81 MG EC tablet Take 81 mg by mouth daily.    . Calcium Carbonate-Vit D-Min (CALTRATE PLUS PO) Take by mouth 2 (two) times daily.    . Cholecalciferol (VITAMIN D) 2000 UNITS CAPS Take by mouth as directed.   . CRESTOR 5 MG tablet TAKE 1 TABLET ON MON, WED, FRIDAY  . Fe Fum-FePoly-Vit C-Vit B3 (INTEGRA) 62.5-62.5-40-3 MG CAPS TAKE 1 CAPSULE BY MOUTH ONCE DAILY  . Fe Fum-FePoly-Vit C-Vit B3 (INTEGRA) 62.5-62.5-40-3 MG CAPS TAKE 1 CAPSULE BY MOUTH ONCE DAILY  . FIBER PO Take by mouth as directed.  . metoCLOPramide (REGLAN) 10 MG tablet TAKE 1 TABLET BY MOUTH AT BEDTIME  . mirabegron ER (MYRBETRIQ) 50 MG TB24 tablet Take 50 mg by mouth daily.  . Multiple Vitamins-Minerals (CENTRUM SILVER PO) Take by mouth.    . Omega-3 Fatty Acids (FISH OIL) 1000 MG CAPS Take 3 capsules by mouth daily.   Marland Kitchen omeprazole (PRILOSEC) 40 MG capsule TAKE 1 CAPSULE (40 MG TOTAL) BY MOUTH DAILY.  Vladimir Faster Glycol-Propyl Glycol (SYSTANE OP) Apply to eye.  1- 2 drop   . rosuvastatin (CRESTOR) 5 MG tablet TAKE 32m ON MON, WED, FRIDAY AND 5 MG THE OTHER DAYS  . Sennosides-Docusate Sodium (SENNA PLUS PO) Take by mouth as directed.  . Zoledronic Acid (RECLAST IV) Inject into the vein. 12/2009  . [DISCONTINUED] Omeprazole Magnesium (PRILOSEC OTC PO) Take 20.6 mg by mouth every evening.    Review of Systems  Constitutional: Negative.   HENT: Negative.   Eyes: Negative.   Respiratory: Negative.   Cardiovascular: Negative.   Gastrointestinal: Negative.   Endocrine: Negative.   Genitourinary: Negative.   Musculoskeletal: Negative.   Skin: Negative.   Allergic/Immunologic: Negative.   Neurological: Negative.   Hematological: Negative.   Psychiatric/Behavioral: Negative.        Objective:   Physical Exam  Constitutional: She is oriented to person, place, and time. She appears well-developed and well-nourished. No distress.  The patient is pleasant and alert and much younger appearing than her stated age  HENT:  Head: Normocephalic and atraumatic.  Right Ear: External ear normal.  Left Ear: External ear normal.  Nose: Nose normal.  Mouth/Throat: Oropharynx is clear and moist.  Eyes: Conjunctivae and EOM are normal. Pupils are equal, round, and reactive to light. Right eye exhibits no discharge. Left eye exhibits no discharge. No scleral icterus.  Neck: Normal range of motion. Neck supple. No thyromegaly present.  No carotid bruits or anterior cervical adenopathy  Cardiovascular: Normal rate, regular rhythm, normal heart sounds and intact distal pulses.   No murmur heard. At 84/m  Pulmonary/Chest: Effort normal and breath sounds normal. No respiratory distress. She has no wheezes. She has no rales. She exhibits no tenderness.  Clear anteriorly and posteriorly  Abdominal: Soft. Bowel sounds are normal. She exhibits no mass. There is no tenderness. There is no rebound and no guarding.  No epigastric tenderness  Musculoskeletal: Normal  range of motion. She exhibits no edema.  Good range of motion and no residual infection from the contusion to her leg.  Lymphadenopathy:    She has no cervical adenopathy.  Neurological: She is alert and oriented to person, place, and time. She has normal reflexes. No cranial nerve deficit.  Skin: Skin is warm and dry. No rash noted.  Psychiatric: She has a normal mood and affect. Her behavior is normal. Judgment and thought content normal.  Nursing note and vitals reviewed.  BP 131/78 mmHg  Pulse 80  Temp(Src) 98.9 F (37.2 C) (Oral)  Ht 5' (1.524 m)  Wt 113 lb (51.256 kg)  BMI 22.07 kg/m2        Assessment & Plan:  1. Vitamin D deficiency -Continue current vitamin D dosing contingent upon lab work being returned - POCT CBC - Vit D  25 hydroxy (rtn osteoporosis monitoring)  2. Gastroesophageal reflux disease, esophagitis presence not specified -The patient is having no problems with this and she should continue with her omeprazole. - POCT CBC - Hepatic function panel  3. Hyperlipidemia -Previous cholesterol readings have been great and she should continue with the current treatment pending results of lab work - POCT CBC - BMP8+EGFR - Hepatic function panel - Lipid panel  4. Euthyroid -The patient is having no symptoms related to her thyroid and she should continue with current treatment pending results of lab work - POCT CBC - Thyroid Panel With TSH  No orders of the defined types were placed in this encounter.   Patient Instructions                       Medicare Annual Wellness Visit  [facility] and the medical providers at [facility] strive to bring you the best medical care.  In doing so we not only want to address your current medical conditions and concerns but also to detect new conditions early and prevent illness, disease and health-related problems.    Medicare offers a yearly Wellness Visit which allows our clinical staff to  assess your need for preventative services including immunizations, lifestyle education, counseling to decrease risk of preventable diseases and screening for fall risk and other medical concerns.    This visit is provided free of charge (no copay) for all Medicare recipients. The clinical pharmacists at [facility] have begun to conduct these Wellness Visits which will also include a thorough review of all your medications.    As you primary medical provider recommend that you make an appointment for your Annual Wellness Visit if you have not done so already this year.  You may set up this appointment before you leave today or you may call back ([phone]) and schedule an appointment.  Please make sure when you call that you mention that you are scheduling your Annual Wellness Visit with the clinical pharmacist so that the appointment may be made for the proper length of time.  Continue current medications. Continue good therapeutic lifestyle changes which include good diet and exercise. Fall precautions discussed with patient. If an FOBT was given today- please return it to our front desk. If you are over 66 years old - you may need Prevnar 62 or the adult Pneumonia vaccine.  Flu Shots are still available at our office. If you still haven't had one please call to set up a nurse visit to get one.   After your visit with [place] today you will receive a survey in the mail or online from [employer] regarding your care with [place]. Please take a moment to fill this out. Your feedback is very important to [place] as you can help [place] better understand your patient needs as well as improve your experience and satisfaction. WE CARE ABOUT YOU!!!   Continue to walk regularly and drink plenty of fluids Wear your monitor all the time Be more careful and do not put yourself at risk for falling Get your mammogram as planned   Arrie Senate MD

## 2015-02-15 NOTE — Addendum Note (Signed)
Addended by: Earlene Plater on: 02/15/2015 03:43 PM   Modules accepted: Orders, SmartSet

## 2015-02-15 NOTE — Patient Instructions (Addendum)
Medicare Annual Wellness Visit  [facility] and the medical providers at [facility] strive to bring you the best medical care.  In doing so we not only want to address your current medical conditions and concerns but also to detect new conditions early and prevent illness, disease and health-related problems.    Medicare offers a yearly Wellness Visit which allows our clinical staff to assess your need for preventative services including immunizations, lifestyle education, counseling to decrease risk of preventable diseases and screening for fall risk and other medical concerns.    This visit is provided free of charge (no copay) for all Medicare recipients. The clinical pharmacists at [facility] have begun to conduct these Wellness Visits which will also include a thorough review of all your medications.    As you primary medical provider recommend that you make an appointment for your Annual Wellness Visit if you have not done so already this year.  You may set up this appointment before you leave today or you may call back ([phone]) and schedule an appointment.  Please make sure when you call that you mention that you are scheduling your Annual Wellness Visit with the clinical pharmacist so that the appointment may be made for the proper length of time.     Continue current medications. Continue good therapeutic lifestyle changes which include good diet and exercise. Fall precautions discussed with patient. If an FOBT was given today- please return it to our front desk. If you are over 37 years old - you may need Prevnar 2 or the adult Pneumonia vaccine.  Flu Shots are still available at our office. If you still haven't had one please call to set up a nurse visit to get one.   After your visit with [place] today you will receive a survey in the mail or online from [employer] regarding your care with [place]. Please take a moment to  fill this out. Your feedback is very important to [place] as you can help [place] better understand your patient needs as well as improve your experience and satisfaction. WE CARE ABOUT YOU!!!   Continue to walk regularly and drink plenty of fluids Wear your monitor all the time Be more careful and do not put yourself at risk for falling Get your mammogram as planned

## 2015-02-16 LAB — BMP8+EGFR
BUN/Creatinine Ratio: 10 — ABNORMAL LOW (ref 11–26)
BUN: 7 mg/dL — AB (ref 8–27)
CO2: 27 mmol/L (ref 18–29)
CREATININE: 0.67 mg/dL (ref 0.57–1.00)
Calcium: 9.2 mg/dL (ref 8.7–10.3)
Chloride: 102 mmol/L (ref 97–108)
GFR, EST AFRICAN AMERICAN: 92 mL/min/{1.73_m2} (ref >59)
GFR, EST NON AFRICAN AMERICAN: 80 mL/min/{1.73_m2} (ref >59)
GLUCOSE: 102 mg/dL — AB (ref 65–99)
Potassium: 4.3 mmol/L (ref 3.5–5.2)
Sodium: 140 mmol/L (ref 134–144)

## 2015-02-16 LAB — CBC WITH DIFFERENTIAL/PLATELET
Basophils Absolute: 0 10*3/uL (ref 0.0–0.2)
Basos: 0 %
EOS: 1 %
Eosinophils Absolute: 0.1 10*3/uL (ref 0.0–0.4)
HEMATOCRIT: 42.1 % (ref 34.0–46.6)
Hemoglobin: 13.7 g/dL (ref 11.1–15.9)
IMMATURE GRANULOCYTES: 0 %
Immature Grans (Abs): 0 10*3/uL (ref 0.0–0.1)
LYMPHS ABS: 3.1 10*3/uL (ref 0.7–3.1)
LYMPHS: 37 %
MCH: 30.2 pg (ref 26.6–33.0)
MCHC: 32.5 g/dL (ref 31.5–35.7)
MCV: 93 fL (ref 79–97)
MONOCYTES: 9 %
Monocytes Absolute: 0.8 10*3/uL (ref 0.1–0.9)
NEUTROS ABS: 4.5 10*3/uL (ref 1.4–7.0)
Neutrophils Relative %: 53 %
PLATELETS: 239 10*3/uL (ref 150–379)
RBC: 4.54 x10E6/uL (ref 3.77–5.28)
RDW: 13.4 % (ref 12.3–15.4)
WBC: 8.5 10*3/uL (ref 3.4–10.8)

## 2015-02-16 LAB — THYROID PANEL WITH TSH
FREE THYROXINE INDEX: 2 (ref 1.2–4.9)
T3 Uptake Ratio: 27 % (ref 24–39)
T4, Total: 7.3 ug/dL (ref 4.5–12.0)
TSH: 3.13 u[IU]/mL (ref 0.450–4.500)

## 2015-02-16 LAB — LIPID PANEL
CHOLESTEROL TOTAL: 157 mg/dL (ref 100–199)
Chol/HDL Ratio: 2.2 ratio units (ref 0.0–4.4)
HDL: 73 mg/dL (ref >39)
LDL Calculated: 64 mg/dL (ref 0–99)
Triglycerides: 101 mg/dL (ref 0–149)
VLDL CHOLESTEROL CAL: 20 mg/dL (ref 5–40)

## 2015-02-16 LAB — HEPATIC FUNCTION PANEL
ALK PHOS: 46 IU/L (ref 39–117)
ALT: 15 IU/L (ref 0–32)
AST: 16 IU/L (ref 0–40)
Albumin: 4.1 g/dL (ref 3.5–4.7)
BILIRUBIN, DIRECT: 0.05 mg/dL (ref 0.00–0.40)
Bilirubin Total: <0.2 mg/dL (ref 0.0–1.2)
Total Protein: 6.2 g/dL (ref 6.0–8.5)

## 2015-02-16 LAB — VITAMIN D 25 HYDROXY (VIT D DEFICIENCY, FRACTURES): VIT D 25 HYDROXY: 65 ng/mL (ref 30.0–100.0)

## 2015-02-18 ENCOUNTER — Telehealth: Payer: Self-pay | Admitting: *Deleted

## 2015-02-18 NOTE — Telephone Encounter (Signed)
-----   Message from Chipper Herb, MD sent at 02/16/2015  2:01 PM EDT ----- The blood sugar was slightly elevated at 102. The creatinine, the most important kidney function test was within normal limits. The electrolytes including potassium were all good. All liver function tests were within normal limits All cholesterol numbers with traditional lipid testing were excellent and at goal with an LDL C at 64.----The patient should continue with her current treatment and with her exercise regimen The vitamin D level was good at 65.0 and she should continue with her current treatment All thyroid function tests were within normal limits----and there is no need for thyroid treatment. The CBC has a normal white blood cell count. The hemoglobin is good and stable at 13.7 and the platelet count is adequate.

## 2015-02-24 ENCOUNTER — Telehealth: Payer: Self-pay | Admitting: Pharmacist

## 2015-02-25 ENCOUNTER — Encounter: Payer: Self-pay | Admitting: Pharmacist

## 2015-02-25 NOTE — Telephone Encounter (Signed)
Paperwork filled out.  Dr Laurance Flatten to sign tomorrow when he RTC.  Patient instructed to call Monday, April 18th to make appt at [facility] for appt.  Number given to patient - [phone]

## 2015-02-28 ENCOUNTER — Other Ambulatory Visit: Payer: Self-pay | Admitting: Internal Medicine

## 2015-03-01 ENCOUNTER — Telehealth: Payer: Self-pay | Admitting: Pharmacist

## 2015-03-01 NOTE — Telephone Encounter (Signed)
Referral refaxed to [facility] Stay fax # [phone]. Patient notified.

## 2015-03-03 ENCOUNTER — Encounter (HOSPITAL_COMMUNITY)
Admission: RE | Admit: 2015-03-03 | Discharge: 2015-03-03 | Disposition: A | Discharge status: Home / Self Care | Payer: Medicare Other | Source: Ambulatory Visit | Attending: Family Medicine | Admitting: Family Medicine

## 2015-03-03 DIAGNOSIS — M810 Age-related osteoporosis without current pathological fracture: Secondary | ICD-10-CM | POA: Insufficient documentation

## 2015-03-03 MED ORDER — ZOLEDRONIC ACID 5 MG/100ML IV SOLN
5.0000 mg | Freq: Once | INTRAVENOUS | Status: AC
  Administered 2015-03-03: 5 mg via INTRAVENOUS

## 2015-03-03 MED ORDER — SODIUM CHLORIDE 0.9 % IV SOLN
INTRAVENOUS | Status: DC
  Administered 2015-03-03: 250 mL via INTRAVENOUS

## 2015-03-03 MED ORDER — ZOLEDRONIC ACID 5 MG/100ML IV SOLN
INTRAVENOUS | Status: AC
  Filled 2015-03-03: qty 100

## 2015-03-08 DIAGNOSIS — H35373 Puckering of macula, bilateral: Secondary | ICD-10-CM | POA: Diagnosis not present

## 2015-03-10 ENCOUNTER — Telehealth: Payer: Self-pay | Admitting: Family Medicine

## 2015-03-10 ENCOUNTER — Other Ambulatory Visit: Payer: Self-pay | Admitting: Internal Medicine

## 2015-03-10 NOTE — Telephone Encounter (Signed)
Pt aware we don't have any crestor samples.

## 2015-03-10 NOTE — Telephone Encounter (Signed)
May I refill Sir?

## 2015-03-11 NOTE — Telephone Encounter (Signed)
Refill x 4 I would like to see her in July

## 2015-03-31 DIAGNOSIS — H35373 Puckering of macula, bilateral: Secondary | ICD-10-CM | POA: Diagnosis not present

## 2015-03-31 DIAGNOSIS — H40013 Open angle with borderline findings, low risk, bilateral: Secondary | ICD-10-CM | POA: Diagnosis not present

## 2015-04-02 ENCOUNTER — Ambulatory Visit (INDEPENDENT_AMBULATORY_CARE_PROVIDER_SITE_OTHER): Payer: Medicare Other | Admitting: Pharmacist

## 2015-04-02 ENCOUNTER — Encounter: Payer: Self-pay | Admitting: Pharmacist

## 2015-04-02 VITALS — BP 128/78 | HR 77 | Ht 60.0 in | Wt 115.0 lb

## 2015-04-02 DIAGNOSIS — Z0000 Encounter for general adult medical examination without abnormal findings: Secondary | ICD-10-CM

## 2015-04-02 NOTE — Progress Notes (Signed)
Patient ID: Kristi Sawyer, female   DOB: [birth year], 81 y.o.   MRN: [id number]    Subjective:   Kristi Sawyer is a 81 y.o. female who presents for a Subsequent Medicare Annual Wellness Visit.   Current Medications (verified) Outpatient Encounter Prescriptions as of 04/02/2015  Medication Sig  . aspirin (SB LOW DOSE ASA EC) 81 MG EC tablet Take 81 mg by mouth daily.    . Calcium Carbonate-Vit D-Min (CALTRATE PLUS PO) Take by mouth daily.   . Cholecalciferol (VITAMIN D) 2000 UNITS CAPS Take by mouth as directed.   . CRESTOR 5 MG tablet TAKE 1 TABLET ON MON, WED, FRIDAY  . Fe Fum-FePoly-Vit C-Vit B3 (INTEGRA) 62.5-62.5-40-3 MG CAPS TAKE 1 CAPSULE BY MOUTH ONCE DAILY  . FIBER PO Take 5 tablets by mouth daily as needed. Fiber choice  . metoCLOPramide (REGLAN) 10 MG tablet TAKE 1 TABLET BY MOUTH AT BEDTIME  . mirabegron ER (MYRBETRIQ) 50 MG TB24 tablet Take 50 mg by mouth daily.  . Multiple Vitamins-Minerals (CENTRUM SILVER PO) Take by mouth.    . Omega-3 Fatty Acids (FISH OIL) 1000 MG CAPS Take 3 capsules by mouth daily.   Marland Kitchen omeprazole (PRILOSEC) 40 MG capsule TAKE 1 CAPSULE (40 MG TOTAL) BY MOUTH DAILY.  Vladimir Faster Glycol-Propyl Glycol (SYSTANE OP) Apply to eye. 1- 2 drop   . rosuvastatin (CRESTOR) 5 MG tablet TAKE 95m ON MON, WED, FRIDAY AND 5 MG THE OTHER DAYS  . Sennosides-Docusate Sodium (SENNA PLUS PO) Take by mouth as directed.  . Zoledronic Acid (RECLAST IV) Inject into the vein. 12/2009  . [DISCONTINUED] Fe Fum-FePoly-Vit C-Vit B3 (INTEGRA) 62.5-62.5-40-3 MG CAPS TAKE 1 CAPSULE BY MOUTH ONCE DAILY   No facility-administered encounter medications on file as of 04/02/2015.    Allergies (verified) Codeine; Penicillins; Prevnar 13; Sulfonamide derivatives; and Tetanus toxoids   History: Past Medical History  Diagnosis Date  . Postmenopausal HRT (hormone replacement therapy)   . Anxiety   . Gastritis   . Acid reflux   . Palpitations   . Hyperlipidemia   . Euthyroid   .  Neuropathy   . Hypermobility syndrome   . Osteoporosis   . Esophageal stricture   . Chronic gastritis   . Celiac disease/sprue   . Cataracts, bilateral   . Shingles   . Hiatal hernia   . Thyroid tumor, benign    Past Surgical History  Procedure Laterality Date  . Partial hysterectomy  1974    w 1/2 right ovary   . Myomectomy    . Thyroidectomy  1975    rt. benign tumor   . Appendectomy  1935  . Cataract extraction Bilateral   . Retinal laser procedure    . Colonoscopy    . Esophagogastroduodenoscopy    . Sigmoidoscopy     Family History  Problem Relation Age of Onset  . Healthy Mother   . Lung cancer Father    Social History   Occupational History  . retired    Social History Main Topics  . Smoking status: Never Smoker   . Smokeless tobacco: Never Used  . Alcohol Use: No  . Drug Use: No  . Sexual Activity: No    Do you feel safe at home?  Yes  Dietary issues and exercise activities: Current Exercise Habits:: Home exercise routine, Type of exercise: walking, Time (Minutes): 30, Frequency (Times/Week): 6, Weekly Exercise (Minutes/Week): 180, Intensity: Moderate  Current Dietary habits:   Patient follows a health diet  Objective:    Today's Vitals   04/02/15 1121  BP: 128/78  Pulse: 77  Height: 5' (1.524 m)  Weight: 115 lb (52.164 kg)   Body mass index is 22.46 kg/(m^2).  Activities of Daily Living In your present state of health, do you have any difficulty performing the following activities: 04/02/2015  Hearing? N  Vision? N  Difficulty concentrating or making decisions? N  Walking or climbing stairs? N  Dressing or bathing? N  Doing errands, shopping? N  Preparing Food and eating ? N  Using the Toilet? N  In the past six months, have you accidently leaked urine? Y  Do you have problems with loss of bowel control? N  Managing your Medications? N  Managing your Finances? N  Housekeeping or managing your Housekeeping? N    Are there smokers  in your home (other than you)? No   Cardiac Risk Factors include: advanced age (>58mn, >>2women);dyslipidemia;family history of premature cardiovascular disease  Depression Screen PHQ 2/9 Scores 04/02/2015 02/15/2015 10/06/2014 07/07/2014  PHQ - 2 Score 0 0 0 0    Fall Risk Fall Risk  04/02/2015 04/02/2015 02/15/2015 10/06/2014 07/07/2014  Falls in the past year? - Yes Yes No No  Number falls in past yr: 2 or more 1 1 - -  Injury with Fall? Yes Yes Yes - -  Risk for fall due to : History of fall(s) - - - -    Cognitive Function: MMSE - Mini Mental State Exam 04/02/2015  Orientation to time 5  Orientation to Place 5  Registration 3  Attention/ Calculation 4  Recall 2  Language- name 2 objects 2  Language- repeat 1  Language- follow 3 step command 3  Language- read & follow direction 1  Write a sentence 1  Copy design 1  Total score 28    Immunizations and Health Maintenance Immunization History  Administered Date(s) Administered  . Influenza,inj,Quad PF,36+ Mos 08/04/2013, 08/18/2014  . Pneumococcal Conjugate-13 11/05/2013   Health Maintenance Due  Topic Date Due  . MAMMOGRAM  04/01/2015    Patient Care Team: DChipper Herb MD as PCP - General CGatha Mayer MD as Consulting Physician (Gastroenterology) GHurman Horn MD as Consulting Physician (Ophthalmology)  Indicate any recent Medical Services you may have received from other than [facility] providers in the past year (date may be approximate).    Assessment:    Annual Wellness Visit    Screening Tests Health Maintenance  Topic Date Due  . MAMMOGRAM  04/01/2015  . PNA vac Low Risk Adult (2 of 2 - PPSV23) 04/17/2015 (Originally 11/05/2014)  . COLONOSCOPY  08/05/2023 (Originally 06/07/2013)  . INFLUENZA VACCINE  06/14/2015  . DEXA SCAN  10/16/2015  . TETANUS/TDAP  06/04/2023  . ZOSTAVAX  Completed        Plan:   During the course of the visit JMysterywas educated and counseled about the following appropriate  screening and preventive services:   Vaccines to include Pneumoccal, Influenza, Hepatitis B, Td, Zostavax - UTD (patient cannot received Prevnar 13 or Tetanus due to adverse reaction)  Colorectal cancer screening - UTD;    Cardiovascular disease screening - UTD;  BP and LDL at goal  Diabetes screening - last BG wa slightly elevated at 102  Bone Denisty / Osteoporosis Screening - UTD; ordered DEXA and appt made for 10/2015  Mammogram - has appt 04/07/15  Glaucoma screening / Diabetic Eye Exam - UTD  Advanced Directives - UTD   Goals    .  Exercise 150 minutes per week (moderate activity)     Great job - keep up daily walking        Patient Instructions (the written plan) were given to the patient.   Cherre Robins, [place] , CPP, CDE  04/02/2015

## 2015-04-02 NOTE — Patient Instructions (Addendum)
[name] , Thank you for taking time to come for your Medicare Wellness Visit. It is a pleasure to work with you on your health and I appreciate your ongoing commitment to your health goals. Please review the following plan we discussed and let me know if I can assist you in the future.   These are the goals we discussed: Goals    . Exercise 150 minutes per week (moderate activity)     Great job - keep up daily walking        Separate dosing of Centrum Silver / multivitamin and Calcium (take one in morning and other at lunch or with supper)  This is a list of the screening recommended for you and due dates:  Health Maintenance  Topic Date Due  . Mammogram  04/01/2015 - appointment 04/07/2015  . Pneumonia vaccines  Completed  . Colon Cancer Screening -colonoscopy No longer required   Colon Cancer Screening - Fecal Occult Blood Test Due 07/14/2015  . Flu Shot  06/14/2015  . DEXA scan (bone density measurement)  10/16/2015  . Tetanus Vaccine  06/04/2023  . Shingles Vaccine  Completed  *Topic was postponed. The date shown is not the original due date.     Fall Prevention and Home Safety Falls cause injuries and can affect all age groups. It is possible to use preventive measures to significantly decrease the likelihood of falls. There are many simple measures which can make your home safer and prevent falls. OUTDOORS  Repair cracks and edges of walkways and driveways.  Remove high doorway thresholds.  Trim shrubbery on the main path into your home.  Have good outside lighting.  Clear walkways of tools, rocks, debris, and clutter.  Check that handrails are not broken and are securely fastened. Both sides of steps should have handrails.  Have leaves, snow, and ice cleared regularly.  Use sand or salt on walkways during winter months.  In the garage, clean up grease or oil spills. BATHROOM  Install night lights.  Install grab bars by the toilet and in the tub and  shower.  Use non-skid mats or decals in the tub or shower.  Place a plastic non-slip stool in the shower to sit on, if needed.  Keep floors dry and clean up all water on the floor immediately.  Remove soap buildup in the tub or shower on a regular basis.  Secure bath mats with non-slip, double-sided rug tape.  Remove throw rugs and tripping hazards from the floors. BEDROOMS  Install night lights.  Make sure a bedside light is easy to reach.  Do not use oversized bedding.  Keep a telephone by your bedside.  Have a firm chair with side arms to use for getting dressed.  Remove throw rugs and tripping hazards from the floor. KITCHEN  Keep handles on pots and pans turned toward the center of the stove. Use back burners when possible.  Clean up spills quickly and allow time for drying.  Avoid walking on wet floors.  Avoid hot utensils and knives.  Position shelves so they are not too high or low.  Place commonly used objects within easy reach.  If necessary, use a sturdy step stool with a grab bar when reaching.  Keep electrical cables out of the way.  Do not use floor polish or wax that makes floors slippery. If you must use wax, use non-skid floor wax.  Remove throw rugs and tripping hazards from the floor. STAIRWAYS  Never leave objects on  stairs.  Place handrails on both sides of stairways and use them. Fix any loose handrails. Make sure handrails on both sides of the stairways are as long as the stairs.  Check carpeting to make sure it is firmly attached along stairs. Make repairs to worn or loose carpet promptly.  Avoid placing throw rugs at the top or bottom of stairways, or properly secure the rug with carpet tape to prevent slippage. Get rid of throw rugs, if possible.  Have an electrician put in a light switch at the top and bottom of the stairs. OTHER FALL PREVENTION TIPS  Wear low-heel or rubber-soled shoes that are supportive and fit well. Wear  closed toe shoes.  When using a stepladder, make sure it is fully opened and both spreaders are firmly locked. Do not climb a closed stepladder.  Add color or contrast paint or tape to grab bars and handrails in your home. Place contrasting color strips on first and last steps.  Learn and use mobility aids as needed. Install an electrical emergency response system.  Turn on lights to avoid dark areas. Replace light bulbs that burn out immediately. Get light switches that glow.  Arrange furniture to create clear pathways. Keep furniture in the same place.  Firmly attach carpet with non-skid or double-sided tape.  Eliminate uneven floor surfaces.  Select a carpet pattern that does not visually hide the edge of steps.  Be aware of all pets. OTHER HOME SAFETY TIPS  Set the water temperature for 120 F (48.8 C).  Keep emergency numbers on or near the telephone.  Keep smoke detectors on every level of the home and near sleeping areas. Document Released: 10/20/2002 Document Revised: 04/30/2012 Document Reviewed: 01/19/2012 [facility] Patient Information 2015 [place], [place]. This information is not intended to replace advice given to you by your health care provider. Make sure you discuss any questions you have with your health care provider.

## 2015-04-07 ENCOUNTER — Other Ambulatory Visit: Payer: Self-pay | Admitting: Family Medicine

## 2015-04-07 DIAGNOSIS — Z1231 Encounter for screening mammogram for malignant neoplasm of breast: Secondary | ICD-10-CM | POA: Diagnosis not present

## 2015-04-22 DIAGNOSIS — N3946 Mixed incontinence: Secondary | ICD-10-CM | POA: Diagnosis not present

## 2015-04-22 DIAGNOSIS — R351 Nocturia: Secondary | ICD-10-CM | POA: Diagnosis not present

## 2015-04-22 DIAGNOSIS — R312 Other microscopic hematuria: Secondary | ICD-10-CM | POA: Diagnosis not present

## 2015-04-26 ENCOUNTER — Other Ambulatory Visit: Payer: Self-pay | Admitting: Internal Medicine

## 2015-04-26 NOTE — Telephone Encounter (Signed)
May I refill, her hgb was 13.7 on 02/16/15.  Thank you.

## 2015-04-26 NOTE — Telephone Encounter (Signed)
OK to refill x 1 year She needs an REV with me this summer please

## 2015-04-27 ENCOUNTER — Encounter: Payer: Self-pay | Admitting: Family Medicine

## 2015-05-07 DIAGNOSIS — R312 Other microscopic hematuria: Secondary | ICD-10-CM | POA: Diagnosis not present

## 2015-05-27 DIAGNOSIS — N3946 Mixed incontinence: Secondary | ICD-10-CM | POA: Diagnosis not present

## 2015-05-27 DIAGNOSIS — R312 Other microscopic hematuria: Secondary | ICD-10-CM | POA: Diagnosis not present

## 2015-06-11 ENCOUNTER — Encounter: Payer: Self-pay | Admitting: Internal Medicine

## 2015-06-11 ENCOUNTER — Ambulatory Visit (INDEPENDENT_AMBULATORY_CARE_PROVIDER_SITE_OTHER): Payer: Medicare Other | Admitting: Internal Medicine

## 2015-06-11 VITALS — BP 124/60 | HR 62 | Ht 60.0 in | Wt 112.1 lb

## 2015-06-11 DIAGNOSIS — K900 Celiac disease: Secondary | ICD-10-CM | POA: Diagnosis not present

## 2015-06-11 DIAGNOSIS — K219 Gastro-esophageal reflux disease without esophagitis: Secondary | ICD-10-CM | POA: Diagnosis not present

## 2015-06-11 NOTE — Assessment & Plan Note (Signed)
Feels well No labs at 88

## 2015-06-11 NOTE — Patient Instructions (Signed)
Please follow up with Dr Carlean Purl in 1 year or sooner if needed.

## 2015-06-11 NOTE — Progress Notes (Signed)
   Subjective:    Patient ID: Kristi Sawyer, female    DOB: October 21, 1926, 81 y.o.   MRN: [id number] Chief complaint: Follow-up of celiac disease and GERD HPI The patient is doing well without any complaints relative to celiac disease or reflux. In use on PPI, nightly metoclopramide and gluten-free diet. She has occasional dietary discretions but does not have diarrhea or other symptoms.  Medications, allergies, past medical history, past surgical history, family history and social history are reviewed and updated in the EMR.  Review of Systems As above    Objective:   Physical Exam BP 124/60 mmHg  Pulse 62  Ht 5' (1.524 m)  Wt 112 lb 2 oz (50.86 kg)  BMI 21.90 kg/m2 She is a spry and energetic 81 year old white woman in no acute distress    Assessment & Plan:    GERD (gastroesophageal reflux disease) Doing well - no tardive dyskinesia RTC 1 year  CELIAC SPRUE Feels well No labs at 81

## 2015-06-11 NOTE — Assessment & Plan Note (Signed)
Doing well - no tardive dyskinesia RTC 1 year

## 2015-06-18 ENCOUNTER — Ambulatory Visit (INDEPENDENT_AMBULATORY_CARE_PROVIDER_SITE_OTHER): Payer: Medicare Other | Admitting: Family Medicine

## 2015-06-18 ENCOUNTER — Encounter: Payer: Self-pay | Admitting: Family Medicine

## 2015-06-18 VITALS — BP 148/87 | HR 77 | Temp 98.1°F | Ht 60.0 in | Wt 111.0 lb

## 2015-06-18 DIAGNOSIS — K219 Gastro-esophageal reflux disease without esophagitis: Secondary | ICD-10-CM

## 2015-06-18 DIAGNOSIS — E559 Vitamin D deficiency, unspecified: Secondary | ICD-10-CM

## 2015-06-18 DIAGNOSIS — Z0000 Encounter for general adult medical examination without abnormal findings: Secondary | ICD-10-CM | POA: Diagnosis not present

## 2015-06-18 DIAGNOSIS — N3281 Overactive bladder: Secondary | ICD-10-CM | POA: Diagnosis not present

## 2015-06-18 DIAGNOSIS — E785 Hyperlipidemia, unspecified: Secondary | ICD-10-CM

## 2015-06-18 LAB — POCT CBC
Granulocyte percent: 50.2 %G (ref 37–80)
HCT, POC: 43 % (ref 37.7–47.9)
Hemoglobin: 13.9 g/dL (ref 12.2–16.2)
Lymph, poc: 4.1 — AB (ref 0.6–3.4)
MCH: 29.4 pg (ref 27–31.2)
MCHC: 32.3 g/dL (ref 31.8–35.4)
MCV: 91 fL (ref 80–97)
MPV: 8.9 fL (ref 0–99.8)
POC Granulocyte: 5 (ref 2–6.9)
POC LYMPH %: 41.9 % (ref 10–50)
Platelet Count, POC: 219 10*3/uL (ref 142–424)
RBC: 4.73 M/uL (ref 4.04–5.48)
RDW, POC: 13.1 %
WBC: 9.9 10*3/uL (ref 4.6–10.2)

## 2015-06-18 NOTE — Progress Notes (Signed)
Subjective:    Patient ID: Kristi Sawyer, female    DOB: 1926/11/03, 81 y.o.   MRN: [id number]  HPI Pt here for follow up and management of chronic medical problems which includes hyperlipidemia. She is taking medications regularly.       Patient Active Problem List   Diagnosis Date Noted  . HYPERLIPIDEMIA 09/26/2010  . ANXIETY 09/26/2010  . GERD (gastroesophageal reflux disease) 09/26/2010  . CELIAC SPRUE 09/26/2010  . Osteoporosis 09/26/2010   Outpatient Encounter Prescriptions as of 06/18/2015  Medication Sig  . aspirin (SB LOW DOSE ASA EC) 81 MG EC tablet Take 81 mg by mouth daily.    . Calcium Carbonate-Vit D-Min (CALTRATE PLUS PO) Take by mouth daily.   . Cholecalciferol (VITAMIN D) 2000 UNITS CAPS Take by mouth as directed.   . Fe Fum-FePoly-Vit C-Vit B3 (INTEGRA) 62.5-62.5-40-3 MG CAPS TAKE 1 CAPSULE BY MOUTH ONCE DAILY  . FIBER PO Take 5 tablets by mouth daily as needed. Fiber choice  . metoCLOPramide (REGLAN) 10 MG tablet TAKE 1 TABLET BY MOUTH AT BEDTIME  . mirabegron ER (MYRBETRIQ) 50 MG TB24 tablet Take 50 mg by mouth daily.  . Multiple Vitamins-Minerals (CENTRUM SILVER PO) Take by mouth.    . Omega-3 Fatty Acids (FISH OIL) 1000 MG CAPS Take 3 capsules by mouth daily.   Marland Kitchen omeprazole (PRILOSEC) 40 MG capsule TAKE 1 CAPSULE (40 MG TOTAL) BY MOUTH DAILY.  Vladimir Faster Glycol-Propyl Glycol (SYSTANE OP) Apply to eye. 1- 2 drop   . rosuvastatin (CRESTOR) 5 MG tablet TAKE 50m ON MON, WED, FRIDAY AND 5 MG THE OTHER DAYS  . Sennosides-Docusate Sodium (SENNA PLUS PO) Take by mouth as directed.  . Zoledronic Acid (RECLAST IV) Inject into the vein. 12/2009  . [DISCONTINUED] CRESTOR 5 MG tablet TAKE 1 TABLET ON MON, WED, FRIDAY   No facility-administered encounter medications on file as of 06/18/2015.     Review of Systems  Constitutional: Negative.   HENT: Negative.   Eyes: Negative.   Respiratory: Negative.   Cardiovascular: Negative.   Gastrointestinal: Negative.     Endocrine: Negative.   Genitourinary: Negative.   Musculoskeletal: Negative.   Skin: Negative.   Allergic/Immunologic: Negative.   Neurological: Negative.   Hematological: Negative.   Psychiatric/Behavioral: Negative.        Objective:   Physical Exam  Constitutional: She is oriented to person, place, and time. She appears well-developed and well-nourished. No distress.  The patient looks and acts incredible for her age of 89years, she is much younger appearing and active than that. She is alert  HENT:  Head: Normocephalic and atraumatic.  Right Ear: External ear normal.  Left Ear: External ear normal.  Nose: Nose normal.  Mouth/Throat: Oropharynx is clear and moist. No oropharyngeal exudate.  Eyes: Conjunctivae and EOM are normal. Pupils are equal, round, and reactive to light. Right eye exhibits no discharge. Left eye exhibits no discharge. No scleral icterus.  Neck: Normal range of motion. Neck supple. No thyromegaly present.  No carotid bruits or anterior cervical adenopathy  Cardiovascular: Normal rate, regular rhythm, normal heart sounds and intact distal pulses.   No murmur heard. The heart is regular at 72/m  Pulmonary/Chest: Effort normal and breath sounds normal. No respiratory distress. She has no wheezes. She has no rales. She exhibits no tenderness.  Lungs are clear anteriorly and posteriorly  Abdominal: Soft. Bowel sounds are normal. She exhibits no mass. There is no tenderness. There is no rebound and no guarding.  Abdomen is nontender without masses or organ enlargement or inguinal adenopathy  Musculoskeletal: Normal range of motion. She exhibits no edema or tenderness.  Lymphadenopathy:    She has no cervical adenopathy.  Neurological: She is alert and oriented to person, place, and time. She has normal reflexes. No cranial nerve deficit.  Skin: Skin is warm and dry. No rash noted.  Psychiatric: She has a normal mood and affect. Her behavior is normal. Judgment  and thought content normal.  Nursing note and vitals reviewed.  BP 148/87 mmHg  Pulse 77  Temp(Src) 98.1 F (36.7 C) (Oral)  Ht 5' (1.524 m)  Wt 111 lb (50.349 kg)  BMI 21.68 kg/m2  Repeat blood pressure 122/82      Assessment & Plan:  1. Vitamin D deficiency -Continue current treatment pending results of lab work - POCT CBC - Vit D  25 hydroxy (rtn osteoporosis monitoring)  2. Gastroesophageal reflux disease, esophagitis presence not specified -Continue omeprazole pending results of lab work - POCT CBC  3. Hyperlipidemia -Continue Crestor and aggressive therapeutic lifestyle changes pending results of lab work - POCT CBC - BMP8+EGFR - Lipid panel - Hepatic function panel  4. Healthcare maintenance -Continue follow-up with Dr. Jeffie Pollock as planned -Monitor blood pressures when possible and right readings and bring these to the next visit - POCT CBC - BMP8+EGFR - Hepatic function panel  5. Overactive bladder -Continue with myrbetriq and follow-up with urologist as planned  Patient Instructions                       Medicare Annual Wellness Visit  [facility] and the medical providers at [facility] strive to bring you the best medical care.  In doing so we not only want to address your current medical conditions and concerns but also to detect new conditions early and prevent illness, disease and health-related problems.    Medicare offers a yearly Wellness Visit which allows our clinical staff to assess your need for preventative services including immunizations, lifestyle education, counseling to decrease risk of preventable diseases and screening for fall risk and other medical concerns.    This visit is provided free of charge (no copay) for all Medicare recipients. The clinical pharmacists at [facility] have begun to conduct these Wellness Visits which will also include a thorough review of all your medications.    As  you primary medical provider recommend that you make an appointment for your Annual Wellness Visit if you have not done so already this year.  You may set up this appointment before you leave today or you may call back ([phone]) and schedule an appointment.  Please make sure when you call that you mention that you are scheduling your Annual Wellness Visit with the clinical pharmacist so that the appointment may be made for the proper length of time.     Continue current medications. Continue good therapeutic lifestyle changes which include good diet and exercise. Fall precautions discussed with patient. If an FOBT was given today- please return it to our front desk. If you are over 4 years old - you may need Prevnar 24 or the adult Pneumonia vaccine.   After your visit with [place] today you will receive a survey in the mail or online from [employer] regarding your care with [place]. Please take a moment to fill this out. Your feedback is very important to [place] as you can help [place] better understand your patient  needs as well as improve your experience and satisfaction. WE CARE ABOUT YOU!!!   The patient should continue to follow-up with the urologist and the gastroenterologist if needed She should continue to be careful and not put herself at risk for falling She should wear her Lifeline at all times not just when she is in the house She should stay active and keep walking She should always drink plenty of fluids   Arrie Senate MD

## 2015-06-18 NOTE — Patient Instructions (Addendum)
Medicare Annual Wellness Visit  [facility] and the medical providers at [facility] strive to bring you the best medical care.  In doing so we not only want to address your current medical conditions and concerns but also to detect new conditions early and prevent illness, disease and health-related problems.    Medicare offers a yearly Wellness Visit which allows our clinical staff to assess your need for preventative services including immunizations, lifestyle education, counseling to decrease risk of preventable diseases and screening for fall risk and other medical concerns.    This visit is provided free of charge (no copay) for all Medicare recipients. The clinical pharmacists at [facility] have begun to conduct these Wellness Visits which will also include a thorough review of all your medications.    As you primary medical provider recommend that you make an appointment for your Annual Wellness Visit if you have not done so already this year.  You may set up this appointment before you leave today or you may call back ([phone]) and schedule an appointment.  Please make sure when you call that you mention that you are scheduling your Annual Wellness Visit with the clinical pharmacist so that the appointment may be made for the proper length of time.     Continue current medications. Continue good therapeutic lifestyle changes which include good diet and exercise. Fall precautions discussed with patient. If an FOBT was given today- please return it to our front desk. If you are over 10 years old - you may need Prevnar 70 or the adult Pneumonia vaccine.   After your visit with [place] today you will receive a survey in the mail or online from [employer] regarding your care with [place]. Please take a moment to fill this out. Your feedback is very important to [place] as you can help [place] better understand your patient needs as well as  improve your experience and satisfaction. WE CARE ABOUT YOU!!!   The patient should continue to follow-up with the urologist and the gastroenterologist if needed She should continue to be careful and not put herself at risk for falling She should wear her Lifeline at all times not just when she is in the house She should stay active and keep walking She should always drink plenty of fluids

## 2015-06-19 LAB — BMP8+EGFR
BUN/Creatinine Ratio: 12 (ref 11–26)
BUN: 10 mg/dL (ref 8–27)
CHLORIDE: 99 mmol/L (ref 97–108)
CO2: 27 mmol/L (ref 18–29)
CREATININE: 0.85 mg/dL (ref 0.57–1.00)
Calcium: 9.3 mg/dL (ref 8.7–10.3)
GFR calc Af Amer: 72 mL/min/{1.73_m2} (ref >59)
GFR calc non Af Amer: 62 mL/min/{1.73_m2} (ref >59)
Glucose: 110 mg/dL — ABNORMAL HIGH (ref 65–99)
Potassium: 4.8 mmol/L (ref 3.5–5.2)
Sodium: 142 mmol/L (ref 134–144)

## 2015-06-19 LAB — LIPID PANEL
CHOL/HDL RATIO: 2.3 ratio (ref 0.0–4.4)
Cholesterol, Total: 169 mg/dL (ref 100–199)
HDL: 72 mg/dL (ref >39)
LDL CALC: 77 mg/dL (ref 0–99)
Triglycerides: 102 mg/dL (ref 0–149)
VLDL Cholesterol Cal: 20 mg/dL (ref 5–40)

## 2015-06-19 LAB — VITAMIN D 25 HYDROXY (VIT D DEFICIENCY, FRACTURES): VIT D 25 HYDROXY: 59.6 ng/mL (ref 30.0–100.0)

## 2015-06-19 LAB — HEPATIC FUNCTION PANEL
ALT: 16 IU/L (ref 0–32)
AST: 19 IU/L (ref 0–40)
Albumin: 4.2 g/dL (ref 3.5–4.7)
Alkaline Phosphatase: 45 IU/L (ref 39–117)
BILIRUBIN, DIRECT: 0.07 mg/dL (ref 0.00–0.40)
TOTAL PROTEIN: 6.5 g/dL (ref 6.0–8.5)

## 2015-06-21 ENCOUNTER — Other Ambulatory Visit: Payer: Medicare Other

## 2015-06-21 DIAGNOSIS — Z1212 Encounter for screening for malignant neoplasm of rectum: Secondary | ICD-10-CM

## 2015-06-23 LAB — FECAL OCCULT BLOOD, IMMUNOCHEMICAL: Fecal Occult Bld: NEGATIVE

## 2015-07-05 DIAGNOSIS — H43821 Vitreomacular adhesion, right eye: Secondary | ICD-10-CM | POA: Diagnosis not present

## 2015-07-05 DIAGNOSIS — H35373 Puckering of macula, bilateral: Secondary | ICD-10-CM | POA: Diagnosis not present

## 2015-07-12 ENCOUNTER — Other Ambulatory Visit: Payer: Self-pay | Admitting: Internal Medicine

## 2015-07-27 DIAGNOSIS — Z961 Presence of intraocular lens: Secondary | ICD-10-CM | POA: Diagnosis not present

## 2015-08-31 ENCOUNTER — Telehealth: Payer: Self-pay | Admitting: Family Medicine

## 2015-09-16 ENCOUNTER — Ambulatory Visit (INDEPENDENT_AMBULATORY_CARE_PROVIDER_SITE_OTHER): Payer: Medicare Other

## 2015-09-16 DIAGNOSIS — Z23 Encounter for immunization: Secondary | ICD-10-CM | POA: Diagnosis not present

## 2015-09-17 ENCOUNTER — Other Ambulatory Visit: Payer: Self-pay | Admitting: Internal Medicine

## 2015-09-17 NOTE — Telephone Encounter (Signed)
May I refill her reglan Sir?

## 2015-09-20 NOTE — Telephone Encounter (Signed)
Refill x 6

## 2015-09-23 ENCOUNTER — Other Ambulatory Visit: Payer: Self-pay | Admitting: Family Medicine

## 2015-10-18 ENCOUNTER — Other Ambulatory Visit: Payer: Self-pay | Admitting: Family Medicine

## 2015-10-18 ENCOUNTER — Ambulatory Visit (INDEPENDENT_AMBULATORY_CARE_PROVIDER_SITE_OTHER): Payer: Medicare Other

## 2015-10-18 ENCOUNTER — Other Ambulatory Visit: Payer: Medicare Other

## 2015-10-18 DIAGNOSIS — M8580 Other specified disorders of bone density and structure, unspecified site: Secondary | ICD-10-CM

## 2015-10-18 DIAGNOSIS — M899 Disorder of bone, unspecified: Secondary | ICD-10-CM

## 2015-10-20 ENCOUNTER — Ambulatory Visit (INDEPENDENT_AMBULATORY_CARE_PROVIDER_SITE_OTHER): Payer: Medicare Other | Admitting: Pharmacist

## 2015-10-20 ENCOUNTER — Encounter: Payer: Self-pay | Admitting: Pharmacist

## 2015-10-20 VITALS — Ht 60.0 in | Wt 110.0 lb

## 2015-10-20 DIAGNOSIS — M810 Age-related osteoporosis without current pathological fracture: Secondary | ICD-10-CM

## 2015-10-20 MED ORDER — MIRABEGRON ER 50 MG PO TB24: 50.0000 mg | ORAL_TABLET | Freq: Every day | ORAL | Status: DC

## 2015-10-20 NOTE — Progress Notes (Signed)
Patient ID: Kristi Sawyer, female   DOB: [birth year], 81 y.o.   MRN: [id number]  [facility] Current Height: Height: 5' (152.4 cm)      Max Lifetime Height:  5'2" Current Weight: Weight: 110 lb (49.896 kg)       Ethnicity:Caucasian    HPI: Does pt already have a diagnosis of:  Osteoporosis?  Yes  Back Pain?  No       Kyphosis?  No Prior fracture?  No Med(s) for Osteoporosis/Osteopenia:  reclast - started in 2011, last dose 04/20/02016 Med(s) previously tried for Osteoporosis/Osteopenia:  none                                                         PMH: Age at menopause:  81yo Hysterectomy?  Yes Oophorectomy?  No - 1 ovary remaining HRT? Yes - Former.   Steroid Use?  No Thyroid med?  No History of cancer?  No History of digestive disorders (ie Crohn's)?  Yes - GERD on PPI Current or previous eating disorders?  No Last Vitamin D Result:  59.6 (06/18/2015) Last GFR Result:  62 (06/18/2015)   FH/SH: Family history of osteoporosis?  No Parent with history of hip fracture?  No Family history of breast cancer?  No Exercise?  Yes - walking almost daily Smoking?  No Alcohol?  No    Calcium Assessment Calcium Intake  # of servings/day  Calcium mg  Milk (8 oz) 1  x  300  = 334m  Yogurt (4 oz) 0 x  200 = 0  Cheese (1 oz) 0 x  200 = 0  Other Calcium sources   2578m Ca supplement 60026mdand MVI = 1000m58mEstimated calcium intake per day 1550mg24mDEXA Results Date of Test T-Score for AP Spine L1-L4 T-Score for Total Left Hip T-Score for Total Right Hip  10/18/2015 0.5 -1.9 -1.5  10/15/2013 0.2 -1.9 -1.5  10/11/2011 -0.1 -1.8 -1.5  09/22/2009 -0.6 -1.8 -1.7         Lowest T-Score recorded was for Left Neck of Femur -2.7  (10/15/2013)    Assessment: Osteoporosis - high fracture risk  Recommendations: 1.  Continue  reclast infused yearly - next due 02/2016 (already has appt with AnnieForestine Na continue calcium 1200mg 57my through supplementation or diet.   ( 3.  continue weight bearing exercise - 30 minutes at least 4 days per week.   4.  Counseled and educated about fall risk and prevention. 5.  Patient is in Medicare coverage gap and asked for samples of Myrbetriq 50mg -34men #21  Recheck DEXA:  1 year  Time spent counseling patient:  30 minutes  Hrithik Boschee ECherre RobinsD, CPP

## 2015-10-20 NOTE — Patient Instructions (Signed)
Continue calcium supplementation - take 1 tablet of calcium carbonate and multivitamin (continue to take at separate times) Continue to exercise daily! Great job!   Fall Prevention in the Home  Falls can cause injuries. They can happen to people of all ages. There are many things you can do to make your home safe and to help prevent falls.  WHAT CAN I DO ON THE OUTSIDE OF MY HOME?  Regularly fix the edges of walkways and driveways and fix any cracks.  Remove anything that might make you trip as you walk through a door, such as a raised step or threshold.  Trim any bushes or trees on the path to your home.  Use bright outdoor lighting.  Clear any walking paths of anything that might make someone trip, such as rocks or tools.  Regularly check to see if handrails are loose or broken. Make sure that both sides of any steps have handrails.  Any raised decks and porches should have guardrails on the edges.  Have any leaves, snow, or ice cleared regularly.  Use sand or salt on walking paths during winter.  Clean up any spills in your garage right away. This includes oil or grease spills. WHAT CAN I DO IN THE BATHROOM?   Use night lights.  Install grab bars by the toilet and in the tub and shower. Do not use towel bars as grab bars.  Use non-skid mats or decals in the tub or shower.  If you need to sit down in the shower, use a plastic, non-slip stool.  Keep the floor dry. Clean up any water that spills on the floor as soon as it happens.  Remove soap buildup in the tub or shower regularly.  Attach bath mats securely with double-sided non-slip rug tape.  Do not have throw rugs and other things on the floor that can make you trip. WHAT CAN I DO IN THE BEDROOM?  Use night lights.  Make sure that you have a light by your bed that is easy to reach.  Do not use any sheets or blankets that are too big for your bed. They should not hang down onto the floor.  Have a firm chair  that has side arms. You can use this for support while you get dressed.  Do not have throw rugs and other things on the floor that can make you trip. WHAT CAN I DO IN THE KITCHEN?  Clean up any spills right away.  Avoid walking on wet floors.  Keep items that you use a lot in easy-to-reach places.  If you need to reach something above you, use a strong step stool that has a grab bar.  Keep electrical cords out of the way.  Do not use floor polish or wax that makes floors slippery. If you must use wax, use non-skid floor wax.  Do not have throw rugs and other things on the floor that can make you trip. WHAT CAN I DO WITH MY STAIRS?  Do not leave any items on the stairs.  Make sure that there are handrails on both sides of the stairs and use them. Fix handrails that are broken or loose. Make sure that handrails are as long as the stairways.  Check any carpeting to make sure that it is firmly attached to the stairs. Fix any carpet that is loose or worn.  Avoid having throw rugs at the top or bottom of the stairs. If you do have throw rugs, attach them to  the floor with carpet tape.  Make sure that you have a light switch at the top of the stairs and the bottom of the stairs. If you do not have them, ask someone to add them for you. WHAT ELSE CAN I DO TO HELP PREVENT FALLS?  Wear shoes that:  Do not have high heels.  Have rubber bottoms.  Are comfortable and fit you well.  Are closed at the toe. Do not wear sandals.  If you use a stepladder:  Make sure that it is fully opened. Do not climb a closed stepladder.  Make sure that both sides of the stepladder are locked into place.  Ask someone to hold it for you, if possible.  Clearly mark and make sure that you can see:  Any grab bars or handrails.  First and last steps.  Where the edge of each step is.  Use tools that help you move around (mobility aids) if they are needed. These  include:  Canes.  Walkers.  Scooters.  Crutches.  Turn on the lights when you go into a dark area. Replace any light bulbs as soon as they burn out.  Set up your furniture so you have a clear path. Avoid moving your furniture around.  If any of your floors are uneven, fix them.  If there are any pets around you, be aware of where they are.  Review your medicines with your doctor. Some medicines can make you feel dizzy. This can increase your chance of falling. Ask your doctor what other things that you can do to help prevent falls.   This information is not intended to replace advice given to you by your health care provider. Make sure you discuss any questions you have with your health care provider.   Document Released: 08/26/2009 Document Revised: 03/16/2015 Document Reviewed: 12/04/2014 Elsevier Interactive Patient Education [employer].

## 2015-10-29 ENCOUNTER — Ambulatory Visit (INDEPENDENT_AMBULATORY_CARE_PROVIDER_SITE_OTHER): Payer: Medicare Other

## 2015-10-29 ENCOUNTER — Encounter: Payer: Self-pay | Admitting: Family Medicine

## 2015-10-29 ENCOUNTER — Ambulatory Visit (INDEPENDENT_AMBULATORY_CARE_PROVIDER_SITE_OTHER): Payer: Medicare Other | Admitting: Family Medicine

## 2015-10-29 VITALS — BP 144/84 | HR 81 | Temp 97.4°F | Ht 60.0 in | Wt 110.0 lb

## 2015-10-29 DIAGNOSIS — M25551 Pain in right hip: Secondary | ICD-10-CM

## 2015-10-29 DIAGNOSIS — M4716 Other spondylosis with myelopathy, lumbar region: Secondary | ICD-10-CM | POA: Diagnosis not present

## 2015-10-29 NOTE — Patient Instructions (Addendum)
The patient should continue to use some warm wet compresses 20 minutes for 5 or 6 times daily She should take extra strength Tylenol every 4-6 hours as needed for severe pain She should use her walker at all times until the hip pain resolves and gets better She needs to be careful and did not put herself at risk for falling

## 2015-10-29 NOTE — Progress Notes (Signed)
Subjective:    Patient ID: Kristi Sawyer, female    DOB: [birth year], 81 y.o.   MRN: [id number]  HPI Patient here today for right hip pain. The pain started yesterday after lunch, no injury. The pain radiates down her leg. It sometimes the pain has been so severe that she has some nausea. The patient says she was eating out with her friends yesterday got up in the restaurant and had severe pain in her right hip and had to be helped to the car. This morning she awoke and the pain was very severe and she had call someone to come and help her but as the time has gone along she has felt better with the hip. She has no rash.     Patient Active Problem List   Diagnosis Date Noted  . Overactive bladder 06/18/2015  . HYPERLIPIDEMIA 09/26/2010  . ANXIETY 09/26/2010  . GERD (gastroesophageal reflux disease) 09/26/2010  . CELIAC SPRUE 09/26/2010  . Osteoporosis 09/26/2010   Outpatient Encounter Prescriptions as of 10/29/2015  Medication Sig  . aspirin (SB LOW DOSE ASA EC) 81 MG EC tablet Take 81 mg by mouth daily.    . Calcium Carbonate-Vit D-Min (CALTRATE PLUS PO) Take 1 tablet by mouth daily.   . Cholecalciferol (VITAMIN D) 2000 UNITS CAPS Take by mouth as directed. Take 1 tablet daily except none on saturdays and sundays  . CRESTOR 5 MG tablet TAKE 1 TABLET ON MON, WED, FRIDAY (Patient taking differently: take 1 tablet daily)  . Fe Fum-FePoly-Vit C-Vit B3 (INTEGRA) 62.5-62.5-40-3 MG CAPS TAKE 1 CAPSULE BY MOUTH ONCE DAILY  . FIBER PO Take 5 tablets by mouth daily as needed. Fiber choice  . metoCLOPramide (REGLAN) 10 MG tablet TAKE 1 TABLET BY MOUTH AT BEDTIME  . mirabegron ER (MYRBETRIQ) 50 MG TB24 tablet Take 1 tablet (50 mg total) by mouth daily.  . Multiple Vitamins-Minerals (CENTRUM SILVER PO) Take 1 tablet by mouth daily.   . Omega-3 Fatty Acids (FISH OIL) 1000 MG CAPS Take 3 capsules by mouth daily.   Marland Kitchen omeprazole (PRILOSEC) 40 MG capsule TAKE 1 CAPSULE (40 MG TOTAL) BY MOUTH DAILY.  Vladimir Faster Glycol-Propyl Glycol (SYSTANE OP) Apply to eye. 1- 2 drop   . rosuvastatin (CRESTOR) 5 MG tablet TAKE 41m ON MON, WED, FRIDAY AND 5 MG THE OTHER DAYS  . Sennosides-Docusate Sodium (SENNA PLUS PO) Take by mouth as directed.  . Zoledronic Acid (RECLAST IV) Inject into the vein. 12/2009   No facility-administered encounter medications on file as of 10/29/2015.      Review of Systems  Constitutional: Negative.   HENT: Negative.   Eyes: Negative.   Respiratory: Negative.   Cardiovascular: Negative.   Gastrointestinal: Negative.   Endocrine: Negative.   Genitourinary: Negative.   Musculoskeletal: Positive for arthralgias (right hip pain).  Skin: Negative.   Allergic/Immunologic: Negative.   Neurological: Negative.   Hematological: Negative.   Psychiatric/Behavioral: Negative.        Objective:   Physical Exam  Constitutional: She is oriented to person, place, and time. She appears well-developed and well-nourished. No distress.  HENT:  Head: Normocephalic and atraumatic.  Mouth/Throat: Oropharynx is clear and moist.  Eyes: Conjunctivae and EOM are normal. Pupils are equal, round, and reactive to light. Right eye exhibits no discharge. Left eye exhibits no discharge. No scleral icterus.  Neck: Normal range of motion.  Abdominal: Soft. Bowel sounds are normal. There is no tenderness. There is no rebound and no guarding.  Musculoskeletal: She exhibits tenderness. She exhibits no edema.  With leg raising and hip abduction on the right there is slight pain in the hip. With palpation over the hip joint there is increased tenderness. There is no low back tenderness.  Neurological: She is alert and oriented to person, place, and time.  Skin: Skin is warm and dry. No rash noted.  Psychiatric: She has a normal mood and affect. Her behavior is normal. Judgment and thought content normal.  Nursing note and vitals reviewed.   BP 144/84 mmHg  Pulse 81  Temp(Src) 97.4 F (36.3 C)  (Oral)  Ht 5' (1.524 m)  Wt 110 lb (49.896 kg)  BMI 21.48 kg/m2  [facility] reading (PRIMARY) by  Dr.Moore-right hip degenerative changes no fracture but moderate to severe degenerative changes in lower lumbar spine The actual written report was reviewed with the patient before she left the office and a shot of Kenalog with Marcaine was injected into the area of tenderness without problems the patient tolerated the procedure well. She was assisted by caregiver to the car and she understands to use her walker around the house and use warm wet compresses to the hip                                     Assessment & Plan:  1. Right hip pain -Kenalog and Marcaine were injected into the right hip joint at the area of most tenderness., The patient tolerated the procedure well. - DG HIP UNILAT W OR W/O PELVIS 2-3 VIEWS RIGHT; Future  2. Lumbar spondylosis with myelopathy -The radiological results were discussed with the patient and I indicated to her that when she returns on Tuesday that she may need LS spine films also. We will see how she is doing at that time.  Patient Instructions  The patient should continue to use some warm wet compresses 20 minutes for 5 or 6 times daily She should take extra strength Tylenol every 4-6 hours as needed for severe pain She should use her walker at all times until the hip pain resolves and gets better She needs to be careful and did not put herself at risk for falling   Arrie Senate MD

## 2015-11-02 ENCOUNTER — Encounter: Payer: Self-pay | Admitting: Family Medicine

## 2015-11-02 ENCOUNTER — Ambulatory Visit (INDEPENDENT_AMBULATORY_CARE_PROVIDER_SITE_OTHER): Payer: Medicare Other | Admitting: Family Medicine

## 2015-11-02 ENCOUNTER — Ambulatory Visit (INDEPENDENT_AMBULATORY_CARE_PROVIDER_SITE_OTHER): Payer: Medicare Other

## 2015-11-02 VITALS — BP 163/86 | HR 71 | Temp 97.9°F | Ht 60.0 in | Wt 107.0 lb

## 2015-11-02 DIAGNOSIS — E559 Vitamin D deficiency, unspecified: Secondary | ICD-10-CM

## 2015-11-02 DIAGNOSIS — N3281 Overactive bladder: Secondary | ICD-10-CM | POA: Diagnosis not present

## 2015-11-02 DIAGNOSIS — E785 Hyperlipidemia, unspecified: Secondary | ICD-10-CM | POA: Diagnosis not present

## 2015-11-02 DIAGNOSIS — Z0000 Encounter for general adult medical examination without abnormal findings: Secondary | ICD-10-CM | POA: Diagnosis not present

## 2015-11-02 DIAGNOSIS — E039 Hypothyroidism, unspecified: Secondary | ICD-10-CM | POA: Diagnosis not present

## 2015-11-02 DIAGNOSIS — K219 Gastro-esophageal reflux disease without esophagitis: Secondary | ICD-10-CM

## 2015-11-02 DIAGNOSIS — M25551 Pain in right hip: Secondary | ICD-10-CM

## 2015-11-02 DIAGNOSIS — IMO0001 Reserved for inherently not codable concepts without codable children: Secondary | ICD-10-CM

## 2015-11-02 NOTE — Progress Notes (Signed)
Subjective:    Patient ID: Kristi Sawyer, female    DOB: [birth year], 81 y.o.   MRN: [id number]  HPI Pt here for follow up and management of chronic medical problems which includes hyperlipidemia. She is taking medications regularly. The right hip pain is better but the pain is not resolved. The blood pressure was elevated on the previous visit and is elevated again today. We will monitor this closely and encourage her to watch her sodium intake. Patient lives by herself. She is being careful with climbing up steps again in her house and climbing down the steps. She does have more mobility. The x-rays were reviewed again today and it did indicate that she has moderate to severe degenerative arthritis in the lower lumbar spine. She denies any chest pain shortness of breath or trouble swallowing heartburn indigestion nausea vomiting diarrhea or blood in the stool. Her GERD symptoms are stable. She does have an overactive bladder and we will give her some myrbetrig samples today that we have in stock.     Patient Active Problem List   Diagnosis Date Noted  . Overactive bladder 06/18/2015  . HYPERLIPIDEMIA 09/26/2010  . ANXIETY 09/26/2010  . GERD (gastroesophageal reflux disease) 09/26/2010  . CELIAC SPRUE 09/26/2010  . Osteoporosis 09/26/2010   Outpatient Encounter Prescriptions as of 11/02/2015  Medication Sig  . aspirin (SB LOW DOSE ASA EC) 81 MG EC tablet Take 81 mg by mouth daily.    . Calcium Carbonate-Vit D-Min (CALTRATE PLUS PO) Take 1 tablet by mouth daily.   . Cholecalciferol (VITAMIN D) 2000 UNITS CAPS Take by mouth as directed. Take 1 tablet daily except none on saturdays and sundays  . CRESTOR 5 MG tablet TAKE 1 TABLET ON MON, WED, FRIDAY (Patient taking differently: take 1 tablet daily)  . Fe Fum-FePoly-Vit C-Vit B3 (INTEGRA) 62.5-62.5-40-3 MG CAPS TAKE 1 CAPSULE BY MOUTH ONCE DAILY  . FIBER PO Take 5 tablets by mouth daily as needed. Fiber choice  . metoCLOPramide (REGLAN) 10  MG tablet TAKE 1 TABLET BY MOUTH AT BEDTIME  . mirabegron ER (MYRBETRIQ) 50 MG TB24 tablet Take 1 tablet (50 mg total) by mouth daily.  . Multiple Vitamins-Minerals (CENTRUM SILVER PO) Take 1 tablet by mouth daily.   . Omega-3 Fatty Acids (FISH OIL) 1000 MG CAPS Take 3 capsules by mouth daily.   Marland Kitchen omeprazole (PRILOSEC) 40 MG capsule TAKE 1 CAPSULE (40 MG TOTAL) BY MOUTH DAILY.  Vladimir Faster Glycol-Propyl Glycol (SYSTANE OP) Apply to eye. 1- 2 drop   . Sennosides-Docusate Sodium (SENNA PLUS PO) Take by mouth as directed.  . Zoledronic Acid (RECLAST IV) Inject into the vein. 12/2009  . [DISCONTINUED] rosuvastatin (CRESTOR) 5 MG tablet TAKE 88m ON MON, WED, FRIDAY AND 5 MG THE OTHER DAYS   No facility-administered encounter medications on file as of 11/02/2015.      Review of Systems  Constitutional: Negative.   HENT: Negative.   Eyes: Negative.   Respiratory: Negative.   Cardiovascular: Negative.   Gastrointestinal: Negative.   Endocrine: Negative.   Genitourinary: Negative.   Musculoskeletal: Positive for arthralgias (right hip pain - is better).  Skin: Negative.   Allergic/Immunologic: Negative.   Neurological: Negative.   Hematological: Negative.   Psychiatric/Behavioral: Negative.        Objective:   Physical Exam  Constitutional: She is oriented to person, place, and time. She appears well-developed and well-nourished. No distress.  HENT:  Head: Normocephalic and atraumatic.  Right Ear: External ear normal.  Left Ear: External ear normal.  Mouth/Throat: Oropharynx is clear and moist. No oropharyngeal exudate.  Slight nasal congestion  Eyes: Conjunctivae and EOM are normal. Pupils are equal, round, and reactive to light. Right eye exhibits no discharge. Left eye exhibits no discharge. No scleral icterus.  The patient is up-to-date on her eye exams  Neck: Normal range of motion. Neck supple. No thyromegaly present.  Without bruits or thyromegaly  Cardiovascular: Normal  rate, regular rhythm and normal heart sounds.   No murmur heard. Pulmonary/Chest: Effort normal and breath sounds normal. No respiratory distress. She has no wheezes. She has no rales. She exhibits no tenderness.  Abdominal: Soft. Bowel sounds are normal. She exhibits no mass. There is no tenderness. There is no rebound and no guarding.  Musculoskeletal: Normal range of motion. She exhibits no edema.  There was slight tenderness over the right lateral hip. There was good mobility with minimal pain with raising the hip.  Lymphadenopathy:    She has no cervical adenopathy.  Neurological: She is alert and oriented to person, place, and time.  Skin: Skin is warm and dry. No rash noted.  Psychiatric: She has a normal mood and affect. Her behavior is normal. Judgment and thought content normal.  Nursing note and vitals reviewed.   BP 163/86 mmHg  Pulse 71  Temp(Src) 97.9 F (36.6 C) (Oral)  Ht 5' (1.524 m)  Wt 107 lb (48.535 kg)  BMI 20.90 kg/m2  [facility] reading (PRIMARY) by  Dr. Bobbe Medico spine and chest x-ray--  severe degenerative changes in the lower lumbar spine, chest x-ray no active disease                                     Assessment & Plan:  1. Vitamin D deficiency -Current treatment pending results of lab work - CBC with Differential/Platelet - VITAMIN D 25 Hydroxy (Vit-D Deficiency, Fractures)  2. Gastroesophageal reflux disease, esophagitis presence not specified -The patient is currently not having any problems with this we will continue to monitor this. - CBC with Differential/Platelet - Hepatic function panel  3. Hyperlipidemia -Continue Crestor and therapeutic lifestyle changes - BMP8+EGFR - CBC with Differential/Platelet - Hepatic function panel - NMR, lipoprofile - DG Chest 2 View; Future  4. Healthcare maintenance -The patient was encouraged to take extra nourishment if needed to keep her weight from dropping too much and she understands ensure and boost. -  BMP8+EGFR - CBC with Differential/Platelet - Hepatic function panel - DG Chest 2 View; Future  5. Euthyroid -Continue current treatment pending results of lab work - CBC with Differential/Platelet  6. Right hip pain -The patient's right hip pain is better but the x-rays did reveal some osteoarthritis changes in her lower back and we will get x-rays of this so that we can possibly do another referral for her back if the hip pain does not get better. - DG Lumbar Spine 2-3 Views; Future  7. Overactive bladder -Continue current treatment  Patient Instructions                       Medicare Annual Wellness Visit  [facility] and the medical providers at [facility] strive to bring you the best medical care.  In doing so we not only want to address your current medical conditions and concerns but also to detect new conditions early and prevent illness, disease and  health-related problems.    Medicare offers a yearly Wellness Visit which allows our clinical staff to assess your need for preventative services including immunizations, lifestyle education, counseling to decrease risk of preventable diseases and screening for fall risk and other medical concerns.    This visit is provided free of charge (no copay) for all Medicare recipients. The clinical pharmacists at [facility] have begun to conduct these Wellness Visits which will also include a thorough review of all your medications.    As you primary medical provider recommend that you make an appointment for your Annual Wellness Visit if you have not done so already this year.  You may set up this appointment before you leave today or you may call back ([phone]) and schedule an appointment.  Please make sure when you call that you mention that you are scheduling your Annual Wellness Visit with the clinical pharmacist so that the appointment may be made for the proper length of time.      Continue current medications. Continue good therapeutic lifestyle changes which include good diet and exercise. Fall precautions discussed with patient. If an FOBT was given today- please return it to our front desk. If you are over 40 years old - you may need Prevnar 39 or the adult Pneumonia vaccine.  **Flu shots are available--- please call and schedule a FLU-CLINIC appointment**  After your visit with [place] today you will receive a survey in the mail or online from [employer] regarding your care with [place]. Please take a moment to fill this out. Your feedback is very important to [place] as you can help [place] better understand your patient needs as well as improve your experience and satisfaction. WE CARE ABOUT YOU!!!    The patient is due to get a chest x-ray today and we will also get LS spine films as the recent hip films indicated that she has severe degenerative arthritis in the lower lumbar spine area. Her hip is better but she is still uncomfortable with this. She received the injection of Kenalog and Marcaine recently.  Use warm wet compresses to the right hip 20 minutes 3 or 4 times daily Continue to be careful and do not put yourself at risk for falling Watch sodium intake If possible check blood pressures outside and bring these readings in for review at the next visit   Arrie Senate MD

## 2015-11-02 NOTE — Patient Instructions (Addendum)
Medicare Annual Wellness Visit  [facility] and the medical providers at [facility] strive to bring you the best medical care.  In doing so we not only want to address your current medical conditions and concerns but also to detect new conditions early and prevent illness, disease and health-related problems.    Medicare offers a yearly Wellness Visit which allows our clinical staff to assess your need for preventative services including immunizations, lifestyle education, counseling to decrease risk of preventable diseases and screening for fall risk and other medical concerns.    This visit is provided free of charge (no copay) for all Medicare recipients. The clinical pharmacists at [facility] have begun to conduct these Wellness Visits which will also include a thorough review of all your medications.    As you primary medical provider recommend that you make an appointment for your Annual Wellness Visit if you have not done so already this year.  You may set up this appointment before you leave today or you may call back ([phone]) and schedule an appointment.  Please make sure when you call that you mention that you are scheduling your Annual Wellness Visit with the clinical pharmacist so that the appointment may be made for the proper length of time.     Continue current medications. Continue good therapeutic lifestyle changes which include good diet and exercise. Fall precautions discussed with patient. If an FOBT was given today- please return it to our front desk. If you are over 44 years old - you may need Prevnar 60 or the adult Pneumonia vaccine.  **Flu shots are available--- please call and schedule a FLU-CLINIC appointment**  After your visit with [place] today you will receive a survey in the mail or online from [employer] regarding your care with [place]. Please take a moment to fill this out. Your feedback is very  important to [place] as you can help [place] better understand your patient needs as well as improve your experience and satisfaction. WE CARE ABOUT YOU!!!    The patient is due to get a chest x-ray today and we will also get LS spine films as the recent hip films indicated that she has severe degenerative arthritis in the lower lumbar spine area. Her hip is better but she is still uncomfortable with this. She received the injection of Kenalog and Marcaine recently.  Use warm wet compresses to the right hip 20 minutes 3 or 4 times daily Continue to be careful and do not put yourself at risk for falling Watch sodium intake If possible check blood pressures outside and bring these readings in for review at the next visit

## 2015-11-03 LAB — CBC WITH DIFFERENTIAL/PLATELET
BASOS: 0 %
Basophils Absolute: 0 10*3/uL (ref 0.0–0.2)
EOS (ABSOLUTE): 0.1 10*3/uL (ref 0.0–0.4)
EOS: 1 %
HEMATOCRIT: 42.7 % (ref 34.0–46.6)
HEMOGLOBIN: 14.4 g/dL (ref 11.1–15.9)
Immature Grans (Abs): 0 10*3/uL (ref 0.0–0.1)
Immature Granulocytes: 0 %
LYMPHS ABS: 3 10*3/uL (ref 0.7–3.1)
Lymphs: 31 %
MCH: 30.8 pg (ref 26.6–33.0)
MCHC: 33.7 g/dL (ref 31.5–35.7)
MCV: 91 fL (ref 79–97)
MONOCYTES: 9 %
Monocytes Absolute: 0.9 10*3/uL (ref 0.1–0.9)
NEUTROS ABS: 5.8 10*3/uL (ref 1.4–7.0)
Neutrophils: 59 %
Platelets: 272 10*3/uL (ref 150–379)
RBC: 4.67 x10E6/uL (ref 3.77–5.28)
RDW: 13 % (ref 12.3–15.4)
WBC: 9.7 10*3/uL (ref 3.4–10.8)

## 2015-11-03 LAB — HEPATIC FUNCTION PANEL
ALBUMIN: 4.4 g/dL (ref 3.5–4.7)
ALK PHOS: 46 IU/L (ref 39–117)
ALT: 26 IU/L (ref 0–32)
AST: 26 IU/L (ref 0–40)
Bilirubin Total: 0.3 mg/dL (ref 0.0–1.2)
Bilirubin, Direct: 0.09 mg/dL (ref 0.00–0.40)
Total Protein: 6.7 g/dL (ref 6.0–8.5)

## 2015-11-03 LAB — BMP8+EGFR
BUN / CREAT RATIO: 17 (ref 11–26)
BUN: 11 mg/dL (ref 8–27)
CO2: 26 mmol/L (ref 18–29)
CREATININE: 0.64 mg/dL (ref 0.57–1.00)
Calcium: 10.2 mg/dL (ref 8.7–10.3)
Chloride: 97 mmol/L (ref 96–106)
GFR, EST AFRICAN AMERICAN: 93 mL/min/{1.73_m2} (ref >59)
GFR, EST NON AFRICAN AMERICAN: 80 mL/min/{1.73_m2} (ref >59)
Glucose: 108 mg/dL — ABNORMAL HIGH (ref 65–99)
Potassium: 4.7 mmol/L (ref 3.5–5.2)
Sodium: 139 mmol/L (ref 134–144)

## 2015-11-03 LAB — NMR, LIPOPROFILE
Cholesterol: 193 mg/dL (ref 100–199)
HDL Cholesterol by NMR: 79 mg/dL (ref >39)
HDL PARTICLE NUMBER: 43.5 umol/L (ref >=30.5)
LDL Particle Number: 1067 nmol/L — ABNORMAL HIGH (ref <1000)
LDL Size: 21 nm (ref >20.5)
LDL-C: 96 mg/dL (ref 0–99)
LP-IR SCORE: 36 (ref <=45)
SMALL LDL PARTICLE NUMBER: 384 nmol/L (ref <=527)
Triglycerides by NMR: 88 mg/dL (ref 0–149)

## 2015-11-03 LAB — VITAMIN D 25 HYDROXY (VIT D DEFICIENCY, FRACTURES): VIT D 25 HYDROXY: 54.7 ng/mL (ref 30.0–100.0)

## 2015-11-10 ENCOUNTER — Telehealth: Payer: Self-pay | Admitting: Family Medicine

## 2015-11-16 ENCOUNTER — Ambulatory Visit: Payer: Medicare Other | Admitting: Family Medicine

## 2015-12-08 DIAGNOSIS — R350 Frequency of micturition: Secondary | ICD-10-CM | POA: Diagnosis not present

## 2015-12-08 DIAGNOSIS — R3915 Urgency of urination: Secondary | ICD-10-CM | POA: Diagnosis not present

## 2015-12-08 DIAGNOSIS — N3946 Mixed incontinence: Secondary | ICD-10-CM | POA: Diagnosis not present

## 2015-12-08 DIAGNOSIS — N3281 Overactive bladder: Secondary | ICD-10-CM | POA: Diagnosis not present

## 2015-12-08 DIAGNOSIS — Z0000 Encounter for general adult medical examination without abnormal findings: Secondary | ICD-10-CM | POA: Diagnosis not present

## 2016-01-21 ENCOUNTER — Encounter: Payer: Self-pay | Admitting: *Deleted

## 2016-01-23 ENCOUNTER — Other Ambulatory Visit: Payer: Self-pay | Admitting: Internal Medicine

## 2016-02-22 ENCOUNTER — Other Ambulatory Visit: Payer: Self-pay | Admitting: Family Medicine

## 2016-03-02 ENCOUNTER — Encounter (HOSPITAL_COMMUNITY)
Admission: RE | Admit: 2016-03-02 | Discharge: 2016-03-02 | Disposition: A | Discharge status: Home / Self Care | Payer: Medicare Other | Source: Ambulatory Visit | Attending: Family Medicine | Admitting: Family Medicine

## 2016-03-02 DIAGNOSIS — M810 Age-related osteoporosis without current pathological fracture: Secondary | ICD-10-CM | POA: Diagnosis not present

## 2016-03-02 LAB — BASIC METABOLIC PANEL
Anion gap: 6 (ref 5–15)
BUN: 10 mg/dL (ref 6–20)
CHLORIDE: 102 mmol/L (ref 101–111)
CO2: 28 mmol/L (ref 22–32)
CREATININE: 0.65 mg/dL (ref 0.44–1.00)
Calcium: 9.3 mg/dL (ref 8.9–10.3)
GFR calc Af Amer: >60 mL/min (ref >60)
GFR calc non Af Amer: >60 mL/min (ref >60)
GLUCOSE: 106 mg/dL — AB (ref 65–99)
POTASSIUM: 4.5 mmol/L (ref 3.5–5.1)
SODIUM: 136 mmol/L (ref 135–145)

## 2016-03-02 MED ORDER — ZOLEDRONIC ACID 5 MG/100ML IV SOLN
5.0000 mg | Freq: Once | INTRAVENOUS | Status: AC
  Administered 2016-03-02: 5 mg via INTRAVENOUS

## 2016-03-02 MED ORDER — ZOLEDRONIC ACID 5 MG/100ML IV SOLN
INTRAVENOUS | Status: AC
  Filled 2016-03-02: qty 100

## 2016-03-02 MED ORDER — SODIUM CHLORIDE 0.9 % IV SOLN
INTRAVENOUS | Status: DC
  Administered 2016-03-02: 200 mL via INTRAVENOUS

## 2016-03-17 ENCOUNTER — Encounter (INDEPENDENT_AMBULATORY_CARE_PROVIDER_SITE_OTHER): Payer: Self-pay

## 2016-03-17 ENCOUNTER — Encounter: Payer: Self-pay | Admitting: Family Medicine

## 2016-03-17 ENCOUNTER — Ambulatory Visit (INDEPENDENT_AMBULATORY_CARE_PROVIDER_SITE_OTHER): Payer: Medicare Other | Admitting: Family Medicine

## 2016-03-17 VITALS — BP 128/76 | HR 83 | Temp 97.8°F | Ht 60.0 in | Wt 100.0 lb

## 2016-03-17 DIAGNOSIS — N3281 Overactive bladder: Secondary | ICD-10-CM

## 2016-03-17 DIAGNOSIS — E559 Vitamin D deficiency, unspecified: Secondary | ICD-10-CM

## 2016-03-17 DIAGNOSIS — M4716 Other spondylosis with myelopathy, lumbar region: Secondary | ICD-10-CM

## 2016-03-17 DIAGNOSIS — E785 Hyperlipidemia, unspecified: Secondary | ICD-10-CM

## 2016-03-17 DIAGNOSIS — M810 Age-related osteoporosis without current pathological fracture: Secondary | ICD-10-CM

## 2016-03-17 DIAGNOSIS — K219 Gastro-esophageal reflux disease without esophagitis: Secondary | ICD-10-CM

## 2016-03-17 DIAGNOSIS — Z0000 Encounter for general adult medical examination without abnormal findings: Secondary | ICD-10-CM

## 2016-03-17 DIAGNOSIS — M25551 Pain in right hip: Secondary | ICD-10-CM | POA: Diagnosis not present

## 2016-03-17 NOTE — Patient Instructions (Addendum)
Medicare Annual Wellness Visit  [facility] and the medical providers at [facility] strive to bring you the best medical care.  In doing so we not only want to address your current medical conditions and concerns but also to detect new conditions early and prevent illness, disease and health-related problems.    Medicare offers a yearly Wellness Visit which allows our clinical staff to assess your need for preventative services including immunizations, lifestyle education, counseling to decrease risk of preventable diseases and screening for fall risk and other medical concerns.    This visit is provided free of charge (no copay) for all Medicare recipients. The clinical pharmacists at [facility] have begun to conduct these Wellness Visits which will also include a thorough review of all your medications.    As you primary medical provider recommend that you make an appointment for your Annual Wellness Visit if you have not done so already this year.  You may set up this appointment before you leave today or you may call back [phone]) and schedule an appointment.  Please make sure when you call that you mention that you are scheduling your Annual Wellness Visit with the clinical pharmacist so that the appointment may be made for the proper length of time.     Continue current medications. Continue good therapeutic lifestyle changes which include good diet and exercise. Fall precautions discussed with patient. If an FOBT was given today- please return it to our front desk. If you are over 97 years old - you may need Prevnar 3 or the adult Pneumonia vaccine.  **Flu shots are available--- please call and schedule a FLU-CLINIC appointment**  After your visit with [place] today you will receive a survey in the mail or online from [employer] regarding your care with [place]. Please take a moment to fill this out. Your feedback is very  important to [place] as you can help [place] better understand your patient needs as well as improve your experience and satisfaction. WE CARE ABOUT YOU!!!   We will arrange for you to have an appointment with the orthopedic surgeon because of your continued right hip pain. He may can try an injection to see if this helps. He may have you see one of his colleagues who can do an injection in your back. Continue to be careful and did not put yourself at risk for falling Continue to avoid all NSAIDs Stay active  Wear your alert necklace at all times

## 2016-03-17 NOTE — Progress Notes (Addendum)
Subjective:    Patient ID: Kristi Sawyer, female    DOB: November 19, 1925, 82 y.o.   MRN: [id number]  HPI Pt here for follow up and management of chronic medical problems which includes hyperlipidemia. She is taking medications regularly.Patient has no specific complaints. She has no needed refills. She is due to get lab work today. She denies any chest pain shortness of breath trouble swallowing heartburn indigestion nausea vomiting diarrhea or blood in the stool. She has to get up once or twice at nighttime to pass her water. She still has some problems with her right hip and it doesn't matter what side she lays on in the bed and will always hurt on the right hip area. When she stands up it hurts in the right hip area. She feels like she has to be careful or she might fall. On reviewing her x-ray she has advanced degenerative disc disease and spondylosis in the lumbar spine with scoliosis. She has some degenerative disease in the right hip. Because this is been an ongoing problem we will get her to see the orthopedic surgeon when he visits the office this month.     Patient Active Problem List   Diagnosis Date Noted  . Overactive bladder 06/18/2015  . Hyperlipidemia 09/26/2010  . ANXIETY 09/26/2010  . GERD (gastroesophageal reflux disease) 09/26/2010  . CELIAC SPRUE 09/26/2010  . Osteoporosis 09/26/2010   Outpatient Encounter Prescriptions as of 03/17/2016  Medication Sig  . aspirin (SB LOW DOSE ASA EC) 81 MG EC tablet Take 81 mg by mouth daily.    . Calcium Carbonate-Vit D-Min (CALTRATE PLUS PO) Take 1 tablet by mouth daily.   . Cholecalciferol (VITAMIN D) 2000 UNITS CAPS Take by mouth as directed. Take 1 tablet daily except none on saturdays and sundays  . Fe Fum-FePoly-Vit C-Vit B3 (INTEGRA) 62.5-62.5-40-3 MG CAPS TAKE 1 CAPSULE BY MOUTH ONCE DAILY  . FIBER PO Take 5 tablets by mouth daily as needed. Fiber choice  . metoCLOPramide (REGLAN) 10 MG tablet TAKE 1 TABLET BY MOUTH AT BEDTIME  .  mirabegron ER (MYRBETRIQ) 50 MG TB24 tablet Take 1 tablet (50 mg total) by mouth daily.  . Multiple Vitamins-Minerals (CENTRUM SILVER PO) Take 1 tablet by mouth daily.   . Omega-3 Fatty Acids (FISH OIL) 1000 MG CAPS Take 3 capsules by mouth daily.   Marland Kitchen omeprazole (PRILOSEC) 40 MG capsule TAKE 1 CAPSULE (40 MG TOTAL) BY MOUTH DAILY.  Vladimir Faster Glycol-Propyl Glycol (SYSTANE OP) Apply to eye. 1- 2 drop   . rosuvastatin (CRESTOR) 5 MG tablet TAKE 1 TABLET ON MON, WED, FRIDAY  . Sennosides-Docusate Sodium (SENNA PLUS PO) Take by mouth as directed.  . Zoledronic Acid (RECLAST IV) Inject into the vein. 12/2009   No facility-administered encounter medications on file as of 03/17/2016.      Review of Systems  Constitutional: Negative.   HENT: Negative.   Eyes: Negative.   Respiratory: Negative.   Cardiovascular: Negative.   Gastrointestinal: Negative.   Endocrine: Negative.   Genitourinary: Negative.   Musculoskeletal: Negative.   Skin: Negative.   Allergic/Immunologic: Negative.   Neurological: Negative.   Hematological: Negative.   Psychiatric/Behavioral: Negative.        Objective:   Physical Exam  Constitutional: She is oriented to person, place, and time. She appears well-developed and well-nourished. No distress.  HENT:  Head: Normocephalic and atraumatic.  Right Ear: External ear normal.  Left Ear: External ear normal.  Nose: Nose normal.  Mouth/Throat: Oropharynx is  clear and moist.  Eyes: Conjunctivae and EOM are normal. Pupils are equal, round, and reactive to light. Right eye exhibits no discharge. Left eye exhibits no discharge. No scleral icterus.  Neck: Normal range of motion. Neck supple. No JVD present. No thyromegaly present.  Cardiovascular: Normal rate, regular rhythm and intact distal pulses.   No murmur heard. At 72/m  Pulmonary/Chest: Effort normal and breath sounds normal. No respiratory distress. She has no wheezes. She has no rales. She exhibits no  tenderness.  Clear anteriorly and posteriorly  Abdominal: Soft. Bowel sounds are normal. She exhibits no mass. There is no tenderness. There is no rebound and no guarding.  No tenderness or organ enlargement or masses  Musculoskeletal: Normal range of motion. She exhibits no edema or tenderness.  Slightly tender right lateral hip  Lymphadenopathy:    She has no cervical adenopathy.  Neurological: She is alert and oriented to person, place, and time. She has normal reflexes. No cranial nerve deficit.  Skin: Skin is warm and dry. No rash noted.  Psychiatric: She has a normal mood and affect. Her behavior is normal. Judgment and thought content normal.  Nursing note and vitals reviewed.  BP 128/76 mmHg  Pulse 83  Temp(Src) 97.8 F (36.6 C) (Oral)  Ht 5' (1.524 m)  Wt 100 lb (45.36 kg)  BMI 19.53 kg/m2        Assessment & Plan:  1. Vitamin D deficiency -Continue current treatment pending results of lab work - CBC with Differential/Platelet - VITAMIN D 25 Hydroxy (Vit-D Deficiency, Fractures)  2. Gastroesophageal reflux disease, esophagitis presence not specified -Continue to avoid NSAIDs - CBC with Differential/Platelet  3. Hyperlipidemia -Continue current treatment with Crestor and aggressive therapeutic lifestyle changes - Lipid panel - BMP8+EGFR - CBC with Differential/Platelet - Hepatic function panel  4. Healthcare maintenance - CBC with Differential/Platelet  5. Osteoporosis -Continue with Reclast  6. Overactive bladder -Continue with Myrbetriq  7. Right hip pain -Appointment with orthopedic surgeon  8. Lumbar spondylosis with myelopathy -Appointment with orthopedic surgeon  9.This patient's BMI is 19.53. -She is encouraged to eat healthy and be active.  Patient Instructions                       Medicare Annual Wellness Visit  [facility] and the medical providers at [facility] strive to bring you the best medical care.  In  doing so we not only want to address your current medical conditions and concerns but also to detect new conditions early and prevent illness, disease and health-related problems.    Medicare offers a yearly Wellness Visit which allows our clinical staff to assess your need for preventative services including immunizations, lifestyle education, counseling to decrease risk of preventable diseases and screening for fall risk and other medical concerns.    This visit is provided free of charge (no copay) for all Medicare recipients. The clinical pharmacists at [facility] have begun to conduct these Wellness Visits which will also include a thorough review of all your medications.    As you primary medical provider recommend that you make an appointment for your Annual Wellness Visit if you have not done so already this year.  You may set up this appointment before you leave today or you may call back ([phone]) and schedule an appointment.  Please make sure when you call that you mention that you are scheduling your Annual Wellness Visit with the clinical pharmacist so  that the appointment may be made for the proper length of time.     Continue current medications. Continue good therapeutic lifestyle changes which include good diet and exercise. Fall precautions discussed with patient. If an FOBT was given today- please return it to our front desk. If you are over 82 years old - you may need Prevnar 3 or the adult Pneumonia vaccine.  **Flu shots are available--- please call and schedule a FLU-CLINIC appointment**  After your visit with [place] today you will receive a survey in the mail or online from [employer] regarding your care with [place]. Please take a moment to fill this out. Your feedback is very important to [place] as you can help [place] better understand your patient needs as well as improve your experience and satisfaction. WE CARE ABOUT YOU!!!      Arrie Senate MD

## 2016-03-17 NOTE — Addendum Note (Signed)
Addended by: Zannie Cove on: 03/17/2016 03:45 PM   Modules accepted: Orders, SmartSet

## 2016-03-18 LAB — BMP8+EGFR
BUN/Creatinine Ratio: 15 (ref 12–28)
BUN: 10 mg/dL (ref 8–27)
CALCIUM: 9.7 mg/dL (ref 8.7–10.3)
CO2: 22 mmol/L (ref 18–29)
CREATININE: 0.66 mg/dL (ref 0.57–1.00)
Chloride: 100 mmol/L (ref 96–106)
GFR calc non Af Amer: 80 mL/min/{1.73_m2} (ref >59)
GFR, EST AFRICAN AMERICAN: 92 mL/min/{1.73_m2} (ref >59)
Glucose: 103 mg/dL — ABNORMAL HIGH (ref 65–99)
Potassium: 4.4 mmol/L (ref 3.5–5.2)
Sodium: 143 mmol/L (ref 134–144)

## 2016-03-18 LAB — HEPATIC FUNCTION PANEL
ALK PHOS: 41 IU/L (ref 39–117)
ALT: 13 IU/L (ref 0–32)
AST: 18 IU/L (ref 0–40)
Albumin: 4.3 g/dL (ref 3.5–4.7)
BILIRUBIN TOTAL: 0.3 mg/dL (ref 0.0–1.2)
BILIRUBIN, DIRECT: 0.09 mg/dL (ref 0.00–0.40)
Total Protein: 6.5 g/dL (ref 6.0–8.5)

## 2016-03-18 LAB — CBC WITH DIFFERENTIAL/PLATELET
BASOS: 0 %
Basophils Absolute: 0 10*3/uL (ref 0.0–0.2)
EOS (ABSOLUTE): 0.1 10*3/uL (ref 0.0–0.4)
EOS: 1 %
HEMATOCRIT: 42 % (ref 34.0–46.6)
HEMOGLOBIN: 13.7 g/dL (ref 11.1–15.9)
IMMATURE GRANS (ABS): 0 10*3/uL (ref 0.0–0.1)
IMMATURE GRANULOCYTES: 0 %
LYMPHS: 39 %
Lymphocytes Absolute: 3.6 10*3/uL — ABNORMAL HIGH (ref 0.7–3.1)
MCH: 31.1 pg (ref 26.6–33.0)
MCHC: 32.6 g/dL (ref 31.5–35.7)
MCV: 96 fL (ref 79–97)
MONOCYTES: 8 %
Monocytes Absolute: 0.7 10*3/uL (ref 0.1–0.9)
NEUTROS PCT: 52 %
Neutrophils Absolute: 4.9 10*3/uL (ref 1.4–7.0)
PLATELETS: 250 10*3/uL (ref 150–379)
RBC: 4.4 x10E6/uL (ref 3.77–5.28)
RDW: 13.7 % (ref 12.3–15.4)
WBC: 9.3 10*3/uL (ref 3.4–10.8)

## 2016-03-18 LAB — LIPID PANEL
CHOLESTEROL TOTAL: 170 mg/dL (ref 100–199)
Chol/HDL Ratio: 2.3 ratio units (ref 0.0–4.4)
HDL: 75 mg/dL (ref >39)
LDL Calculated: 78 mg/dL (ref 0–99)
TRIGLYCERIDES: 84 mg/dL (ref 0–149)
VLDL Cholesterol Cal: 17 mg/dL (ref 5–40)

## 2016-03-18 LAB — VITAMIN D 25 HYDROXY (VIT D DEFICIENCY, FRACTURES): Vit D, 25-Hydroxy: 63.1 ng/mL (ref 30.0–100.0)

## 2016-04-04 DIAGNOSIS — H35351 Cystoid macular degeneration, right eye: Secondary | ICD-10-CM | POA: Diagnosis not present

## 2016-04-04 DIAGNOSIS — H35371 Puckering of macula, right eye: Secondary | ICD-10-CM | POA: Diagnosis not present

## 2016-04-04 DIAGNOSIS — H43821 Vitreomacular adhesion, right eye: Secondary | ICD-10-CM | POA: Diagnosis not present

## 2016-04-04 DIAGNOSIS — H35372 Puckering of macula, left eye: Secondary | ICD-10-CM | POA: Diagnosis not present

## 2016-04-07 DIAGNOSIS — Z1231 Encounter for screening mammogram for malignant neoplasm of breast: Secondary | ICD-10-CM | POA: Diagnosis not present

## 2016-04-14 ENCOUNTER — Encounter: Payer: Self-pay | Admitting: Pharmacist

## 2016-04-14 ENCOUNTER — Ambulatory Visit (INDEPENDENT_AMBULATORY_CARE_PROVIDER_SITE_OTHER): Payer: Medicare Other | Admitting: Pharmacist

## 2016-04-14 VITALS — BP 138/86 | HR 80 | Ht 61.0 in | Wt 102.0 lb

## 2016-04-14 DIAGNOSIS — Z0000 Encounter for general adult medical examination without abnormal findings: Secondary | ICD-10-CM | POA: Diagnosis not present

## 2016-04-14 MED ORDER — OMEPRAZOLE 20 MG PO CPDR: 20.0000 mg | DELAYED_RELEASE_CAPSULE | Freq: Every day | ORAL | Status: DC

## 2016-04-14 NOTE — Progress Notes (Signed)
Patient ID: Kristi Sawyer, female   DOB: August 11, 1926, 82 y.o.   MRN: [id number]    Subjective:   Kristi Sawyer is a 82 y.o. female who presents for a Subsequent Medicare Annual Wellness Visit. Kristi Sawyer is widowed and lives alone. 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 She has no medical compliants today  Review of Systems  Review of Systems  Constitutional: Positive for weight loss. Negative for fever, chills, malaise/fatigue and diaphoresis.  HENT: Negative.   Eyes: Negative.        Patient has a tear in right eye and will have surgery by Dr Zadie Rhine 04/25/2016   Respiratory: Negative.   Cardiovascular: Negative.  Palpitations: has lost about 13# since 1 year ago.  Gastrointestinal: Negative.   Genitourinary: Negative.   Musculoskeletal: Positive for joint pain (rigth hip - seeing Dr Graceann Congress soon).  Skin: Negative.   Neurological: Negative.  Negative for weakness.  Endo/Heme/Allergies: Negative.   Psychiatric/Behavioral: Negative.      Current Medications (verified) Outpatient Encounter Prescriptions as of 04/14/2016  Medication Sig  . aspirin (SB LOW DOSE ASA EC) 81 MG EC tablet Take 81 mg by mouth daily.    . Calcium Carbonate-Vit D-Min (CALTRATE PLUS PO) Take 1 tablet by mouth daily.   . Cholecalciferol (VITAMIN D) 2000 UNITS CAPS Take by mouth as directed. Take 1 tablet daily except none on saturdays and sundays  . Fe Fum-FePoly-Vit C-Vit B3 (INTEGRA) 62.5-62.5-40-3 MG CAPS TAKE 1 CAPSULE BY MOUTH ONCE DAILY  . FIBER PO Take 5 tablets by mouth daily as needed. Fiber choice  . metoCLOPramide (REGLAN) 10 MG tablet TAKE 1 TABLET BY MOUTH AT BEDTIME  . mirabegron ER (MYRBETRIQ) 50 MG TB24 tablet Take 1 tablet (50 mg total) by mouth daily.  . Multiple Vitamins-Minerals (CENTRUM SILVER PO) Take 1 tablet by mouth daily.   . Omega-3 Fatty Acids (FISH OIL) 1000 MG CAPS Take 3 capsules by mouth daily.   Kristi Sawyer Glycol-Propyl Glycol  (SYSTANE OP) Apply to eye. 1- 2 drop   . rosuvastatin (CRESTOR) 5 MG tablet TAKE 1 TABLET ON MON, WED, FRIDAY  . Sennosides-Docusate Sodium (SENNA PLUS PO) Take by mouth as directed.  . [DISCONTINUED] omeprazole (PRILOSEC) 40 MG capsule TAKE 1 CAPSULE (40 MG TOTAL) BY MOUTH DAILY.  . [DISCONTINUED] Zoledronic Acid (RECLAST IV) Inject into the vein. 12/2009  . omeprazole (PRILOSEC) 20 MG capsule Take 1-2 capsules (20-40 mg total) by mouth daily.   No facility-administered encounter medications on file as of 04/14/2016.    Allergies (verified) Codeine; Penicillins; Prevnar 13; Sulfonamide derivatives; and Tetanus toxoids   History: Past Medical History  Diagnosis Date  . Postmenopausal HRT (hormone replacement therapy)   . Anxiety   . Gastritis   . Acid reflux   . Palpitations   . Hyperlipidemia   . Euthyroid   . Neuropathy ([place])   . Hypermobility syndrome   . Osteoporosis   . Esophageal stricture   . Chronic gastritis   . Celiac disease/sprue   . Cataracts, bilateral   . Shingles   . Hiatal hernia   . Thyroid tumor, benign   . 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 Anemia    Past Surgical History  Procedure Laterality Date  . Partial hysterectomy  1974    w 1/2 right ovary   . Myomectomy    . Thyroidectomy  1975    rt.  benign tumor   . Appendectomy  1935  . Cataract extraction Bilateral   . Retinal laser procedure    . Colonoscopy    . Esophagogastroduodenoscopy    . Sigmoidoscopy    . Abdominal hysterectomy    . Eye surgery     Family History  Problem Relation Age of Onset  . Healthy Mother   . Lung cancer Father    Social History   Occupational History  . retired    Social History Main Topics  . Smoking status: Never Smoker   . Smokeless tobacco: Never Used  . Alcohol Use: No  . Drug Use: No  . Sexual Activity: No    Do you feel safe at home?  Yes Are there smokers in your home (other than you)? No  Dietary issues and exercise  activities: Current Exercise Habits: Home exercise routine, Type of exercise: walking, Time (Minutes): 30, Frequency (Times/Week): 7, Weekly Exercise (Minutes/Week): 210, Intensity: Moderate  Current Dietary habits:  Patient states that she does not eat as much as she use to.  Her appetite is "just not what it use to be".  She does eat a variety of fruits and vegetable and lean proteins.   Objective:    Today's Vitals   04/14/16 1221  BP: 138/86  Pulse: 80  Height: 5' 1"  (1.549 m)  Weight: 102 lb (46.267 kg)  PainSc: 2   PainLoc: Knee   Body mass index is 19.28 kg/(m^2).  Activities of Daily Living In your present state of health, do you have any difficulty performing the following activities: 04/14/2016  Hearing? N  Vision? N  Difficulty concentrating or making decisions? N  Walking or climbing stairs? N  Dressing or bathing? N  Doing errands, shopping? N  Preparing Food and eating ? N  Using the Toilet? N  In the past six months, have you accidently leaked urine? N  Do you have problems with loss of bowel control? N  Managing your Medications? N  Managing your Finances? N  Housekeeping or managing your Housekeeping? N     Cardiac Risk Factors include: advanced age (>61mn, >>80women);dyslipidemia;family history of premature cardiovascular disease  Depression Screen PHQ 2/9 Scores 04/14/2016 03/17/2016 11/02/2015 10/29/2015  PHQ - 2 Score 0 0 0 0     Fall Risk Fall Risk  04/14/2016 03/17/2016 11/02/2015 10/29/2015 06/18/2015  Falls in the past year? No No No No Yes  Number falls in past yr: - - - - 2 or more  Injury with Fall? - - - - No  Risk for fall due to : - - - - -  Follow up - - - - -    Cognitive Function: MMSE - Mini Mental State Exam 04/14/2016 04/02/2015  Orientation to time 5 5  Orientation to Place 5 5  Registration 3 3  Attention/ Calculation 5 4  Recall 3 2  Language- name 2 objects 2 2  Language- repeat 1 1  Language- follow 3 step command 3 3  Language-  read & follow direction 1 1  Write a sentence 1 1  Copy design 1 1  Total score 30 28    Immunizations and Health Maintenance Immunization History  Administered Date(s) Administered  . Influenza,inj,Quad PF,36+ Mos 08/04/2013, 08/18/2014, 09/16/2015  . Pneumococcal Conjugate-13 11/05/2013   There are no preventive care reminders to display for this patient.  Patient Care Team: DChipper Herb MD as PCP - General CGatha Mayer MD as Consulting Physician (Gastroenterology)  Hurman Horn, MD as Consulting Physician (Ophthalmology)  Indicate any recent Medical Services you may have received from other than [facility] providers in the past year (date may be approximate).    Assessment:    Annual Wellness Visit  Osteoporosis - has received Reclast since 2011 with good improvement in BMD.    Screening Tests Health Maintenance  Topic Date Due  . PNA vac Low Risk Adult (2 of 2 - PPSV23) 06/17/2016 (Originally 11/05/2014)  . INFLUENZA VACCINE  06/13/2016  . MAMMOGRAM  04/07/2017  . DEXA SCAN  10/17/2017  . TETANUS/TDAP  06/04/2023  . ZOSTAVAX  Completed        Plan:   During the course of the visit Jalan was educated and counseled about the following appropriate screening and preventive services:   Vaccines to include Pneumoccal, Influenza, Td, Zostavax - vaccines are UTD.  Patient is allergic to Tdap so not eligible.  Colorectal cancer screening - FOBT is UTD  Cardiovascular disease screening - BP at goal; lipids at goal; last EKG was 2011 - consider at next PCP visit is he deems appropriate.  Diabetes screening - last BG was elevated but this was not a fasting BG.  Will check A1c and FBG at next visit  Bone Denisty / Osteoporosis Screening - UTD.   Patient has had Reclast treatment for 6 years (last was 02/2016)  will hold Reclast and recheck DEXA 10/2017  Mammogram- UTD - was done 04/07/2016  PAP - no requried / complete hysterectomy  Glaucoma screening / Eye Exam -  UTD  Nutrition counseling - discussed ways to increase caloric intake.  Low fat ice cream, peanut butter, nuts.  Patient to continue to eat a variety of fruit and vegetables.  Advanced Directives - UTD  Physical Activity - continue walking daily  Decreased omeprazole to 65m daily for 1 month, then decrease to QOD for 1 month, then try to stop completely.  Patient can take TUMS or pepcid acid controlled if needed for heart burn.    Patient Instructions (the written plan) were given to the patient.   [facility]herre Robins [facility]  04/14/2016

## 2016-04-14 NOTE — Patient Instructions (Addendum)
Kristi Sawyer , Thank you for taking time to come for your Medicare Wellness Visit. I appreciate your ongoing commitment to your health goals. Please review the following plan we discussed and let me know if I can assist you in the future.   These are the goals we discussed:  Try to decreased omeprazole dose and eventually stop if able:  - Sent in prescription for omeprazole 30m - take 1 capsules daily for 1 month - then take 1 capsule every other day for 1 month  -then try to stop completely - if you need something for heartburn you can take pepcid / famotidine acid controller which you can get over the counter or tums   Goals    . Exercise 150 minutes per week (moderate activity)     Great job - keep up daily walking       This is a list of the screening recommended for you and due dates:  Health Maintenance  Topic Date Due  . Mammogram  04/07/2017  . Pneumonia vaccines (2 of 2 - PPSV23) completed  . Flu Shot  06/13/2016  . DEXA scan (bone density measurement)  10/17/2017  . Tetanus Vaccine  06/04/2023  . Shingles Vaccine  Completed  *Topic was postponed. The date shown is not the original due date.   Fall Prevention in the Home  Falls can cause injuries and can affect people from all age groups. There are many simple things that you can do to make your home safe and to help prevent falls. WHAT CAN I DO ON THE OUTSIDE OF MY HOME?  Regularly repair the edges of walkways and driveways and fix any cracks.  Remove high doorway thresholds.  Trim any shrubbery on the main path into your home.  Use bright outdoor lighting.  Clear walkways of debris and clutter, including tools and rocks.  Regularly check that handrails are securely fastened and in good repair. Both sides of any steps should have handrails.  Install guardrails along the edges of any raised decks or porches.  Have leaves, snow, and ice cleared regularly.  Use sand or salt on walkways during winter months.  In  the garage, clean up any spills right away, including grease or oil spills. WHAT CAN I DO IN THE BATHROOM?  Use night lights.  Install grab bars by the toilet and in the tub and shower. Do not use towel bars as grab bars.  Use non-skid mats or decals on the floor of the tub or shower.  If you need to sit down while you are in the shower, use a plastic, non-slip stool..Marland Kitchen Keep the floor dry. Immediately clean up any water that spills on the floor.  Remove soap buildup in the tub or shower on a regular basis.  Attach bath mats securely with double-sided non-slip rug tape.  Remove throw rugs and other tripping hazards from the floor. WHAT CAN I DO IN THE BEDROOM?  Use night lights.  Make sure that a bedside light is easy to reach.  Do not use oversized bedding that drapes onto the floor.  Have a firm chair that has side arms to use for getting dressed.  Remove throw rugs and other tripping hazards from the floor. WHAT CAN I DO IN THE KITCHEN?   Clean up any spills right away.  Avoid walking on wet floors.  Place frequently used items in easy-to-reach places.  If you need to reach for something above you, use a sturdy step stool  that has a grab bar.  Keep electrical cables out of the way.  Do not use floor polish or wax that makes floors slippery. If you have to use wax, make sure that it is non-skid floor wax.  Remove throw rugs and other tripping hazards from the floor. WHAT CAN I DO IN THE STAIRWAYS?  Do not leave any items on the stairs.  Make sure that there are handrails on both sides of the stairs. Fix handrails that are broken or loose. Make sure that handrails are as long as the stairways.  Check any carpeting to make sure that it is firmly attached to the stairs. Fix any carpet that is loose or worn.  Avoid having throw rugs at the top or bottom of stairways, or secure the rugs with carpet tape to prevent them from moving.  Make sure that you have a light  switch at the top of the stairs and the bottom of the stairs. If you do not have them, have them installed. WHAT ARE SOME OTHER FALL PREVENTION TIPS?  Wear closed-toe shoes that fit well and support your feet. Wear shoes that have rubber soles or low heels.  When you use a stepladder, make sure that it is completely opened and that the sides are firmly locked. Have someone hold the ladder while you are using it. Do not climb a closed stepladder.  Add color or contrast paint or tape to grab bars and handrails in your home. Place contrasting color strips on the first and last steps.  Use mobility aids as needed, such as canes, walkers, scooters, and crutches.  Turn on lights if it is dark. Replace any light bulbs that burn out.  Set up furniture so that there are clear paths. Keep the furniture in the same spot.  Fix any uneven floor surfaces.  Choose a carpet design that does not hide the edge of steps of a stairway.  Be aware of any and all pets.  Review your medicines with your healthcare provider. Some medicines can cause dizziness or changes in blood pressure, which increase your risk of falling. Talk with your health care provider about other ways that you can decrease your risk of falls. This may include working with a physical therapist or trainer to improve your strength, balance, and endurance.   This information is not intended to replace advice given to you by your health care provider. Make sure you discuss any questions you have with your health care provider.   Document Released: 10/20/2002 Document Revised: 03/16/2015 Document Reviewed: 12/04/2014 Elsevier Interactive Patient Education [employer].

## 2016-04-14 NOTE — Progress Notes (Signed)
Nurse from [facility] called and canceled Kristi Sawyer reclast appt for April. Stated pt would be having a bone scan  to reevaluate bone density at a later date.

## 2016-04-25 DIAGNOSIS — H35371 Puckering of macula, right eye: Secondary | ICD-10-CM | POA: Diagnosis not present

## 2016-04-25 DIAGNOSIS — H547 Unspecified visual loss: Secondary | ICD-10-CM | POA: Diagnosis not present

## 2016-05-03 DIAGNOSIS — H35371 Puckering of macula, right eye: Secondary | ICD-10-CM | POA: Diagnosis not present

## 2016-05-03 DIAGNOSIS — Z09 Encounter for follow-up examination after completed treatment for conditions other than malignant neoplasm: Secondary | ICD-10-CM | POA: Diagnosis not present

## 2016-05-04 ENCOUNTER — Other Ambulatory Visit: Payer: Self-pay | Admitting: Orthopedic Surgery

## 2016-05-04 ENCOUNTER — Ambulatory Visit (INDEPENDENT_AMBULATORY_CARE_PROVIDER_SITE_OTHER): Payer: Medicare Other

## 2016-05-04 DIAGNOSIS — M25551 Pain in right hip: Secondary | ICD-10-CM

## 2016-05-04 DIAGNOSIS — M25552 Pain in left hip: Secondary | ICD-10-CM

## 2016-05-04 DIAGNOSIS — M419 Scoliosis, unspecified: Secondary | ICD-10-CM | POA: Diagnosis not present

## 2016-05-04 DIAGNOSIS — R52 Pain, unspecified: Secondary | ICD-10-CM | POA: Diagnosis not present

## 2016-05-04 DIAGNOSIS — M5136 Other intervertebral disc degeneration, lumbar region: Secondary | ICD-10-CM | POA: Diagnosis not present

## 2016-05-24 ENCOUNTER — Telehealth: Payer: Self-pay

## 2016-05-24 NOTE — Telephone Encounter (Signed)
[facility] requesting refill on her Integra capsule, she's had recent labs with Dr Laurance Flatten, please advise , thanks.

## 2016-05-25 MED ORDER — INTEGRA 62.5-62.5-40-3 MG PO CAPS: 1.0000 | ORAL_CAPSULE | Freq: Every day | ORAL | Status: DC

## 2016-05-25 NOTE — Telephone Encounter (Signed)
Ok x 12

## 2016-05-30 DIAGNOSIS — M5136 Other intervertebral disc degeneration, lumbar region: Secondary | ICD-10-CM | POA: Diagnosis not present

## 2016-06-07 DIAGNOSIS — Z09 Encounter for follow-up examination after completed treatment for conditions other than malignant neoplasm: Secondary | ICD-10-CM | POA: Diagnosis not present

## 2016-06-07 DIAGNOSIS — H35371 Puckering of macula, right eye: Secondary | ICD-10-CM | POA: Diagnosis not present

## 2016-06-07 DIAGNOSIS — H35372 Puckering of macula, left eye: Secondary | ICD-10-CM | POA: Diagnosis not present

## 2016-06-14 ENCOUNTER — Telehealth: Payer: Self-pay | Admitting: Family Medicine

## 2016-06-14 NOTE — Telephone Encounter (Signed)
Yes - Ok to stop omperazole as long as she is not having reflux on regular basis.  Can use tums as needed for heartburn (up to 3 times per week)

## 2016-06-14 NOTE — Telephone Encounter (Signed)
Patient aware.

## 2016-06-17 ENCOUNTER — Other Ambulatory Visit: Payer: Self-pay | Admitting: Family Medicine

## 2016-07-13 DIAGNOSIS — M5136 Other intervertebral disc degeneration, lumbar region: Secondary | ICD-10-CM | POA: Diagnosis not present

## 2016-07-18 ENCOUNTER — Ambulatory Visit: Payer: Medicare Other | Attending: Orthopedic Surgery | Admitting: Physical Therapy

## 2016-07-18 DIAGNOSIS — M25551 Pain in right hip: Secondary | ICD-10-CM | POA: Insufficient documentation

## 2016-07-18 NOTE — Therapy (Signed)
[facility] [place], [place], [address] Phone: [phone]   Fax:  [phone]  Physical Therapy Evaluation  Patient Details  Name: Kristi Sawyer MRN: [id number] Date of Birth: Feb 29, 1926 Referring Provider: Gaynelle Arabian MD  Encounter Date: 07/18/2016      PT End of Session - 07/18/16 1054    Visit Number 1   Number of Visits 16   Date for PT Re-Evaluation 09/16/16   PT Start Time 1025   PT Stop Time 1113   PT Time Calculation (min) 48 min   Activity Tolerance Patient tolerated treatment well   Behavior During Therapy [facility] for tasks assessed/performed      Past Medical History:  Diagnosis Date  . Acid reflux   . Anemia   . Anxiety   . Cataracts, bilateral   . Celiac disease/sprue   . Chronic gastritis   . Esophageal stricture   . Euthyroid   . Gastritis   . Hiatal hernia   . Hyperlipidemia   . Hypermobility syndrome   . 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 Neuropathy ([place])   . Osteoporosis   . Palpitations   . Postmenopausal HRT (hormone replacement therapy)   . Shingles   . Thyroid tumor, benign     Past Surgical History:  Procedure Laterality Date  . ABDOMINAL HYSTERECTOMY    . APPENDECTOMY  1935  . CATARACT EXTRACTION Bilateral   . COLONOSCOPY    . ESOPHAGOGASTRODUODENOSCOPY    . EYE SURGERY    . MYOMECTOMY    . PARTIAL HYSTERECTOMY  1974   w 1/2 right ovary   . RETINAL LASER PROCEDURE    . SIGMOIDOSCOPY    . THYROIDECTOMY  1975   rt. benign tumor     There were no vitals filed for this visit.       Subjective Assessment - 07/18/16 1024    Subjective Patient began to experience right buttock pain in December of 2016 after sitting for awhile and then getting up she felt an intense in her right buttock.  Patient had a low back injection but it did not help.  CC is pain in her buttock.  Her increases when she first gets up in morning and with housework and crossing legs.  Pain  is a low today rated a 2/10 and is a 4-5/10 at it's highest.   Patient Stated Goals Get out of pain.   Currently in Pain? Yes   Pain Score 2    Pain Location Buttocks   Pain Orientation Right   Pain Descriptors / Indicators Dull;Aching   Pain Onset More than a month ago   Pain Frequency Constant   Aggravating Factors  Housework and crossing right leg over left.   Pain Relieving Factors Avoid the above activity.            [facility] PT Assessment - 07/18/16 0001      Assessment   Medical Diagnosis Lumbar DDD; right hamstring pain.   Referring Provider Gaynelle Arabian MD   Onset Date/Surgical Date --  December 2016.     Precautions   Precautions --  OP.     Restrictions   Weight Bearing Restrictions No     Balance Screen   Has the patient fallen in the past 6 months No   Has the patient had a decrease in activity level because of a fear of falling?  No   Is the patient reluctant to leave their home  because of a fear of falling?  No     Home Ecologist residence     Prior Function   Level of Independence Independent     Posture/Postural Control   Posture/Postural Control Postural limitations   Postural Limitations Decreased lumbar lordosis   Posture Comments --  Some scoliosis.     ROM / Strength   AROM / PROM / Strength AROM;Strength     AROM   Overall AROM Comments Right SLR= 55 degrees limited by pain and left= 65 degrees.     Strength   Overall Strength Comments Normal bilateral LE strength.     Palpation   Palpation comment Very tender to palpation over patient's left ischial tuberosity.     Special Tests    Special Tests --  Norm LE DTR's;(=) leg lengths.     Transfers   Transfers Independent with all Transfers     Ambulation/Gait   Gait Comments Rigid upright purposeful gait pattern.                   [facility] Adult PT Treatment/Exercise - 07/18/16 0001      Modalities   Modalities Electrical  Stimulation;Moist Heat     Moist Heat Therapy   Number Minutes Moist Heat 15 Minutes   Moist Heat Location --  Right ischial tuberosity.     Acupuncturist Location Right ischial tuberosity.   Electrical Stimulation Action Constant Pre-mod.   Electrical Stimulation Parameters 80-150 Hz. x 15 minutes.   Electrical Stimulation Goals Pain                  PT Short Term Goals - 07/18/16 1100      PT SHORT TERM GOAL #1   Title STG's=LTG's.           PT Long Term Goals - 07/18/16 1102      PT LONG TERM GOAL #1   Title Independent with an HEP.   Time 8   Period Weeks   Status New     PT LONG TERM GOAL #2   Title Perform ADL's with pain not > 2/10.   Time 8   Period Weeks   Status New               Plan - 07/18/16 1055    Clinical Impression Statement The patient presents to outpatient physical therapy withg c/o right buttock pain.  She had a lumbar injection that was not helpful.  She is very palpably tender over her right ischial tuberosity.  Her right SLR is less than left due to pain.  Crossing her right leg over her left increases her pain.  The patient walks daily for exercise without pain increase.   Rehab Potential Excellent   PT Frequency 2x / week   PT Duration 8 weeks   PT Next Visit Plan In left SDLY position with pillows between knees for comfort:  HMP and electrical stimulation; U/S and STW/M.  Pain-free hamstring exercises (ie:  [employer]).   Consulted and Agree with Plan of Care Patient      Patient will benefit from skilled therapeutic intervention in order to improve the following deficits and impairments:  Pain, Decreased activity tolerance  Visit Diagnosis: Pain in right hip - Plan: PT plan of care cert/re-cert      G-Codes - 41/74/08 1023    Functional Assessment Tool Used FOTO....49% limitation.   Functional Limitation Mobility: Walking and moving  around   Mobility: Walking and Moving Around  Current Status [phone]) At least 40 percent but less than 60 percent impaired, limited or restricted   Mobility: Walking and Moving Around Goal Status [phone]) At least 20 percent but less than 40 percent impaired, limited or restricted       Problem List Patient Active Problem List   Diagnosis Date Noted  . Overactive bladder 06/18/2015  . Hyperlipidemia 09/26/2010  . ANXIETY 09/26/2010  . GERD (gastroesophageal reflux disease) 09/26/2010  . CELIAC SPRUE 09/26/2010  . Osteoporosis 09/26/2010    Ytzel Gubler, [place] 07/18/2016, 11:14 AM  [facility] [place], [place], [address] Phone: [phone]   Fax:  [phone]  Name: Kristi Sawyer MRN: [id number] Date of Birth: Jan 01, 1926

## 2016-07-24 ENCOUNTER — Encounter: Payer: Self-pay | Admitting: Family Medicine

## 2016-07-24 ENCOUNTER — Encounter: Payer: Self-pay | Admitting: Physical Therapy

## 2016-07-24 ENCOUNTER — Ambulatory Visit (INDEPENDENT_AMBULATORY_CARE_PROVIDER_SITE_OTHER): Payer: Medicare Other | Admitting: Family Medicine

## 2016-07-24 ENCOUNTER — Ambulatory Visit: Payer: Medicare Other | Admitting: Physical Therapy

## 2016-07-24 VITALS — BP 138/81 | HR 72 | Temp 97.2°F | Ht 61.0 in | Wt 95.8 lb

## 2016-07-24 DIAGNOSIS — M25551 Pain in right hip: Secondary | ICD-10-CM | POA: Diagnosis not present

## 2016-07-24 DIAGNOSIS — R634 Abnormal weight loss: Secondary | ICD-10-CM

## 2016-07-24 DIAGNOSIS — E785 Hyperlipidemia, unspecified: Secondary | ICD-10-CM | POA: Diagnosis not present

## 2016-07-24 DIAGNOSIS — K219 Gastro-esophageal reflux disease without esophagitis: Secondary | ICD-10-CM

## 2016-07-24 DIAGNOSIS — E559 Vitamin D deficiency, unspecified: Secondary | ICD-10-CM

## 2016-07-24 NOTE — Patient Instructions (Addendum)
Medicare Annual Wellness Visit  [facility] and the medical providers at [facility] strive to bring you the best medical care.  In doing so we not only want to address your current medical conditions and concerns but also to detect new conditions early and prevent illness, disease and health-related problems.    Medicare offers a yearly Wellness Visit which allows our clinical staff to assess your need for preventative services including immunizations, lifestyle education, counseling to decrease risk of preventable diseases and screening for fall risk and other medical concerns.    This visit is provided free of charge (no copay) for all Medicare recipients. The clinical pharmacists at [facility] have begun to conduct these Wellness Visits which will also include a thorough review of all your medications.    As you primary medical provider recommend that you make an appointment for your Annual Wellness Visit if you have not done so already this year.  You may set up this appointment before you leave today or you may call back ([phone]) and schedule an appointment.  Please make sure when you call that you mention that you are scheduling your Annual Wellness Visit with the clinical pharmacist so that the appointment may be made for the proper length of time.     Continue current medications. Continue good therapeutic lifestyle changes which include good diet and exercise. Fall precautions discussed with patient. If an FOBT was given today- please return it to our front desk. If you are over 40 years old - you may need Prevnar 43 or the adult Pneumonia vaccine.  **Flu shots are available--- please call and schedule a FLU-CLINIC appointment**  After your visit with [place] today you will receive a survey in the mail or online from [employer] regarding your care with [place]. Please take a moment to fill this out. Your feedback is very  important to [place] as you can help [place] better understand your patient needs as well as improve your experience and satisfaction. WE CARE ABOUT YOU!!!   We will arrange for you to visit with the clinical pharmacist for nutritional counseling Continue with physical therapy for right hip pain and leg pain Continue to drink plenty of fluids and eat healthy Continue to be careful do not put yourself at risk for falling We will see you back in about 1 month to make sure that everything is improving and no additional problems have occurred

## 2016-07-24 NOTE — Therapy (Signed)
[facility] [place], [place], [address] Phone: [phone]   Fax:  [phone]  Physical Therapy Treatment  Patient Details  Name: Kristi Sawyer MRN: [id number] Date of Birth: [birth year] Referring Provider: Gaynelle Arabian MD  Encounter Date: 07/24/2016      PT End of Session - 07/24/16 1124    Visit Number 2   Number of Visits 16   Date for PT Re-Evaluation 09/16/16   PT Start Time 1034   PT Stop Time 1122   PT Time Calculation (min) 48 min   Activity Tolerance Patient tolerated treatment well   Behavior During Therapy [facility] for tasks assessed/performed      Past Medical History:  Diagnosis Date  . Acid reflux   . Anemia   . Anxiety   . Cataracts, bilateral   . Celiac disease/sprue   . Chronic gastritis   . Esophageal stricture   . Euthyroid   . Gastritis   . Hiatal hernia   . Hyperlipidemia   . Hypermobility syndrome   . 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 Neuropathy ([place])   . Osteoporosis   . Palpitations   . Postmenopausal HRT (hormone replacement therapy)   . Shingles   . Thyroid tumor, benign     Past Surgical History:  Procedure Laterality Date  . ABDOMINAL HYSTERECTOMY    . APPENDECTOMY  1935  . CATARACT EXTRACTION Bilateral   . COLONOSCOPY    . ESOPHAGOGASTRODUODENOSCOPY    . EYE SURGERY    . MYOMECTOMY    . PARTIAL HYSTERECTOMY  1974   w 1/2 right ovary   . RETINAL LASER PROCEDURE    . SIGMOIDOSCOPY    . THYROIDECTOMY  1975   rt. benign tumor     There were no vitals filed for this visit.      Subjective Assessment - 07/24/16 1031    Subjective Reports that her main issue is in her hip and leg. Reports that pain is never really bad. Reports that upon waking she has difficulty moving and takes some tylenol.   Patient Stated Goals Get out of pain.   Currently in Pain? Yes   Pain Score 4    Pain Location Hip   Pain Orientation Right   Pain Descriptors / Indicators  Dull;Aching   Pain Onset More than a month ago            [facility] PT Assessment - 07/24/16 0001      Assessment   Medical Diagnosis Lumbar DDD; right hamstring pain.     Restrictions   Weight Bearing Restrictions No                     [facility] Adult PT Treatment/Exercise - 07/24/16 0001      Modalities   Modalities Electrical Stimulation;Moist Heat;Ultrasound     Moist Heat Therapy   Number Minutes Moist Heat 15 Minutes   Moist Heat Location Hip     Electrical Stimulation   Electrical Stimulation Location R Piriformis/ HS   Electrical Stimulation Action Pre-Mod   Electrical Stimulation Parameters 80-150 hz x15 min   Electrical Stimulation Goals Pain     Ultrasound   Ultrasound Location R buttock   Ultrasound Parameters 1.2 w/cm2, 50%, 1 mhz x10 min   Ultrasound Goals Pain     Manual Therapy   Manual Therapy Soft tissue mobilization   Soft tissue mobilization STW/MFR to R Glute/Piriformis/ proximal HS attachment to decrease  pain and tightness                  PT Short Term Goals - 07/18/16 1100      PT SHORT TERM GOAL #1   Title STG's=LTG's.           PT Long Term Goals - 07/18/16 1102      PT LONG TERM GOAL #1   Title Independent with an HEP.   Time 8   Period Weeks   Status New     PT LONG TERM GOAL #2   Title Perform ADL's with pain not > 2/10.   Time 8   Period Weeks   Status New               Plan - 07/24/16 1143    Clinical Impression Statement Patient presented in clinic with 4/10 R hip pain but no limitations regarding walking or activities. Patient presented with minimal tightness in R Piriformis region and HS attachment. Patient did not report any tenderness with manual therapy to the treated musculature. Normal modaliites response noted following removal of the modalities. Patient's goals remain on-going as this was only her second visit to clinic for this episode.   Rehab Potential Excellent   PT Frequency 2x  / week   PT Duration 8 weeks   PT Next Visit Plan Continue with manual therapy and modalities per MPT POC as symptoms dictate.   Consulted and Agree with Plan of Care Patient      Patient will benefit from skilled therapeutic intervention in order to improve the following deficits and impairments:  Pain, Decreased activity tolerance  Visit Diagnosis: Pain in right hip     Problem List Patient Active Problem List   Diagnosis Date Noted  . Overactive bladder 06/18/2015  . Hyperlipidemia 09/26/2010  . ANXIETY 09/26/2010  . GERD (gastroesophageal reflux disease) 09/26/2010  . CELIAC SPRUE 09/26/2010  . Osteoporosis 09/26/2010    Wynelle Fanny, PTA 07/24/2016, 11:58 AM  [facility] [address] [place], [place], [address] Phone: [phone]   Fax:  [phone]  Name: Kristi Sawyer MRN: [id number] Date of Birth: February 02, 1926

## 2016-07-24 NOTE — Progress Notes (Signed)
Subjective:    Patient ID: Kristi Sawyer, female    DOB: 1926-03-12, 82 y.o.   MRN: [id number]  HPI Pt here for follow up and management of chronic medical problems which includes hyperlipidemia. She is taking medications regularly.The patient has had ongoing problems with her low back pain. She is seen the orthopedic surgeon has had injections by Dr. Nelva Bush with physical therapy. She is due for lab work and will be given an FOBT to return. She has no specific complaints. She is only had 2 rounds of physical therapy. It is too early to decide if that's can help or not. She denies any chest pain or shortness of breath. She is not having any trouble with swallowing heartburn indigestion nausea vomiting diarrhea or blood in the stool. She is passing her water without problems. She is lost 7 pounds of weight and does appear thinner than in the past but has no specific complaints to associate with this. She says her pain with her right hip and buttock area is a 3 or 4 at the most and it bothers her mostly with arising from a sitting position or rolling over in the bed. When she gets up in the morning and take some extra strength Tylenol the pain gets better. She is not having trouble walking.     Patient Active Problem List   Diagnosis Date Noted  . Overactive bladder 06/18/2015  . Hyperlipidemia 09/26/2010  . ANXIETY 09/26/2010  . GERD (gastroesophageal reflux disease) 09/26/2010  . CELIAC SPRUE 09/26/2010  . Osteoporosis 09/26/2010   Outpatient Encounter Prescriptions as of 07/24/2016  Medication Sig  . aspirin (SB LOW DOSE ASA EC) 81 MG EC tablet Take 81 mg by mouth daily.    . Calcium Carbonate-Vit D-Min (CALTRATE PLUS PO) Take 1 tablet by mouth daily.   . Cholecalciferol (VITAMIN D) 2000 UNITS CAPS Take by mouth as directed. Take 1 tablet daily except none on saturdays and sundays  . Fe Fum-FePoly-Vit C-Vit B3 (INTEGRA) 62.5-62.5-40-3 MG CAPS Take 1 capsule by mouth daily.  Marland Kitchen FIBER PO Take 5  tablets by mouth daily as needed. Fiber choice  . metoCLOPramide (REGLAN) 10 MG tablet TAKE 1 TABLET BY MOUTH AT BEDTIME  . mirabegron ER (MYRBETRIQ) 50 MG TB24 tablet Take 1 tablet (50 mg total) by mouth daily.  . Multiple Vitamins-Minerals (CENTRUM SILVER PO) Take 1 tablet by mouth daily.   . Omega-3 Fatty Acids (FISH OIL) 1000 MG CAPS Take 3 capsules by mouth daily.   Marland Kitchen omeprazole (PRILOSEC) 20 MG capsule Take 1-2 capsules (20-40 mg total) by mouth daily.  Vladimir Faster Glycol-Propyl Glycol (SYSTANE OP) Apply to eye. 1- 2 drop   . rosuvastatin (CRESTOR) 5 MG tablet TAKE 1 TABLET ON MON, WED, FRIDAY  . Sennosides-Docusate Sodium (SENNA PLUS PO) Take by mouth as directed.   No facility-administered encounter medications on file as of 07/24/2016.        Review of Systems  Constitutional: Negative.   HENT: Negative.   Eyes: Negative.   Respiratory: Negative.   Cardiovascular: Negative.   Gastrointestinal: Negative.   Endocrine: Negative.   Genitourinary: Negative.   Musculoskeletal: Negative.   Skin: Negative.   Allergic/Immunologic: Negative.   Neurological: Negative.   Hematological: Negative.   Psychiatric/Behavioral: Negative.        Objective:   Physical Exam  Constitutional: She is oriented to person, place, and time. She appears well-developed and well-nourished. No distress.  HENT:  Head: Normocephalic and atraumatic.  Right  Ear: External ear normal.  Left Ear: External ear normal.  Nose: Nose normal.  Mouth/Throat: Oropharynx is clear and moist.  Eyes: Conjunctivae and EOM are normal. Pupils are equal, round, and reactive to light. Right eye exhibits no discharge. Left eye exhibits no discharge. No scleral icterus.  Neck: Normal range of motion. Neck supple. No thyromegaly present.  Cardiovascular: Normal rate, regular rhythm, normal heart sounds and intact distal pulses.   No murmur heard. Pulmonary/Chest: Effort normal and breath sounds normal. No respiratory  distress. She has no wheezes. She has no rales. She exhibits no tenderness.  Clear anteriorly and posteriorly  Abdominal: Soft. Bowel sounds are normal. She exhibits no mass. There is no tenderness. There is no rebound and no guarding.  Genitourinary:  Genitourinary Comments: Breasts examined without masses or axillary adenopathy  Musculoskeletal: Normal range of motion. She exhibits no edema.  Lymphadenopathy:    She has no cervical adenopathy.  Neurological: She is alert and oriented to person, place, and time. She has normal reflexes. No cranial nerve deficit.  Skin: Skin is warm and dry. No rash noted.  Psychiatric: She has a normal mood and affect. Her behavior is normal. Judgment and thought content normal.  Nursing note and vitals reviewed.  BP 138/81 (BP Location: Left Arm)   Pulse 72   Temp 97.2 F (36.2 C) (Oral)   Ht 5' 1" (1.549 m)   Wt 95 lb 12.8 oz (43.5 kg)   BMI 18.10 kg/m         Assessment & Plan:  1. Vitamin D deficiency -Continue current treatment pending results of lab work - CBC with Differential/Platelet - VITAMIN D 25 Hydroxy (Vit-D Deficiency, Fractures)  2. Gastroesophageal reflux disease, esophagitis presence not specified -The patient is having no problems with this today and she will continue with current treatment - CBC with Differential/Platelet - Hepatic function panel  3. Hyperlipidemia -Continue with current treatment pending results of lab work - CBC with Differential/Platelet - BMP8+EGFR - Hepatic function panel - Lipid panel  4. Decreased weight -Return FOBT -Review labs -Recheck patient in 4 weeks for follow-up from nutritional counseling and back pain.  Patient Instructions                       Medicare Annual Wellness Visit  [facility] and the medical providers at [facility] strive to bring you the best medical care.  In doing so we not only want to address your current medical conditions and  concerns but also to detect new conditions early and prevent illness, disease and health-related problems.    Medicare offers a yearly Wellness Visit which allows our clinical staff to assess your need for preventative services including immunizations, lifestyle education, counseling to decrease risk of preventable diseases and screening for fall risk and other medical concerns.    This visit is provided free of charge (no copay) for all Medicare recipients. The clinical pharmacists at [facility] have begun to conduct these Wellness Visits which will also include a thorough review of all your medications.    As you primary medical provider recommend that you make an appointment for your Annual Wellness Visit if you have not done so already this year.  You may set up this appointment before you leave today or you may call back [phone]) and schedule an appointment.  Please make sure when you call that you mention that you are scheduling your Annual Wellness Visit with  the clinical pharmacist so that the appointment may be made for the proper length of time.     Continue current medications. Continue good therapeutic lifestyle changes which include good diet and exercise. Fall precautions discussed with patient. If an FOBT was given today- please return it to our front desk. If you are over 33 years old - you may need Prevnar 19 or the adult Pneumonia vaccine.  **Flu shots are available--- please call and schedule a FLU-CLINIC appointment**  After your visit with [place] today you will receive a survey in the mail or online from [employer] regarding your care with [place]. Please take a moment to fill this out. Your feedback is very important to [place] as you can help [place] better understand your patient needs as well as improve your experience and satisfaction. WE CARE ABOUT YOU!!!   We will arrange for you to visit with the clinical pharmacist for nutritional counseling Continue with  physical therapy for right hip pain and leg pain Continue to drink plenty of fluids and eat healthy Continue to be careful do not put yourself at risk for falling We will see you back in about 1 month to make sure that everything is improving and no additional problems have occurred    Arrie Senate MD

## 2016-07-25 ENCOUNTER — Other Ambulatory Visit: Payer: Medicare Other

## 2016-07-25 ENCOUNTER — Ambulatory Visit (INDEPENDENT_AMBULATORY_CARE_PROVIDER_SITE_OTHER): Payer: Medicare Other | Admitting: Pharmacist

## 2016-07-25 ENCOUNTER — Encounter: Payer: Self-pay | Admitting: Pharmacist

## 2016-07-25 VITALS — Ht 61.0 in | Wt 98.0 lb

## 2016-07-25 DIAGNOSIS — R634 Abnormal weight loss: Secondary | ICD-10-CM | POA: Diagnosis not present

## 2016-07-25 DIAGNOSIS — Z1211 Encounter for screening for malignant neoplasm of colon: Secondary | ICD-10-CM

## 2016-07-25 DIAGNOSIS — R636 Underweight: Secondary | ICD-10-CM | POA: Diagnosis not present

## 2016-07-25 LAB — HEPATIC FUNCTION PANEL
ALT: 15 IU/L (ref 0–32)
AST: 22 IU/L (ref 0–40)
Albumin: 4.3 g/dL (ref 3.5–4.7)
Alkaline Phosphatase: 47 IU/L (ref 39–117)
Bilirubin Total: 0.2 mg/dL (ref 0.0–1.2)
Bilirubin, Direct: 0.08 mg/dL (ref 0.00–0.40)
Total Protein: 6.8 g/dL (ref 6.0–8.5)

## 2016-07-25 LAB — CBC WITH DIFFERENTIAL/PLATELET
BASOS ABS: 0 10*3/uL (ref 0.0–0.2)
BASOS: 0 %
EOS (ABSOLUTE): 0.1 10*3/uL (ref 0.0–0.4)
Eos: 1 %
Hematocrit: 41.8 % (ref 34.0–46.6)
Hemoglobin: 13.6 g/dL (ref 11.1–15.9)
IMMATURE GRANULOCYTES: 0 %
Immature Grans (Abs): 0 10*3/uL (ref 0.0–0.1)
Lymphocytes Absolute: 5.4 10*3/uL — ABNORMAL HIGH (ref 0.7–3.1)
Lymphs: 43 %
MCH: 30.7 pg (ref 26.6–33.0)
MCHC: 32.5 g/dL (ref 31.5–35.7)
MCV: 94 fL (ref 79–97)
MONOS ABS: 1 10*3/uL — AB (ref 0.1–0.9)
Monocytes: 8 %
NEUTROS ABS: 5.9 10*3/uL (ref 1.4–7.0)
NEUTROS PCT: 48 %
PLATELETS: 334 10*3/uL (ref 150–379)
RBC: 4.43 x10E6/uL (ref 3.77–5.28)
RDW: 14.2 % (ref 12.3–15.4)
WBC: 12.4 10*3/uL — ABNORMAL HIGH (ref 3.4–10.8)

## 2016-07-25 LAB — BMP8+EGFR
BUN / CREAT RATIO: 16 (ref 12–28)
BUN: 9 mg/dL (ref 8–27)
CHLORIDE: 96 mmol/L (ref 96–106)
CO2: 26 mmol/L (ref 18–29)
Calcium: 9.7 mg/dL (ref 8.7–10.3)
Creatinine, Ser: 0.58 mg/dL (ref 0.57–1.00)
GFR calc non Af Amer: 83 mL/min/{1.73_m2} (ref >59)
GFR, EST AFRICAN AMERICAN: 96 mL/min/{1.73_m2} (ref >59)
Glucose: 93 mg/dL (ref 65–99)
POTASSIUM: 5 mmol/L (ref 3.5–5.2)
Sodium: 139 mmol/L (ref 134–144)

## 2016-07-25 LAB — LIPID PANEL
CHOLESTEROL TOTAL: 169 mg/dL (ref 100–199)
Chol/HDL Ratio: 2.2 ratio units (ref 0.0–4.4)
HDL: 77 mg/dL (ref >39)
LDL Calculated: 74 mg/dL (ref 0–99)
TRIGLYCERIDES: 91 mg/dL (ref 0–149)
VLDL CHOLESTEROL CAL: 18 mg/dL (ref 5–40)

## 2016-07-25 LAB — VITAMIN D 25 HYDROXY (VIT D DEFICIENCY, FRACTURES): Vit D, 25-Hydroxy: 74.7 ng/mL (ref 30.0–100.0)

## 2016-07-25 NOTE — Progress Notes (Signed)
Patient ID: Kristi Sawyer, female   DOB: [birth year], 82 y.o.   MRN: [id number]  CC: weight loss  HPI:  Kristi Sawyer is referred by Dr Laurance Flatten due to loss of 7# over the last 3 months.  She saw Dr Laurance Flatten yeasterday and weigh was 98#.  I last saw her 04/2016 and weight was 105#.   She reports that she eats lots of fruits and vegetables.  She eats a gluten free diet due to Celiac Sprue.  Eat 3 small meals a day.    She walks daily - about 30 minutes every day   Current Outpatient Prescriptions:  .  aspirin (SB LOW DOSE ASA EC) 81 MG EC tablet, Take 81 mg by mouth daily.  , Disp: , Rfl:  .  Calcium Carbonate-Vit D-Min (CALTRATE PLUS PO), Take 1 tablet by mouth daily. , Disp: , Rfl:  .  Cholecalciferol (VITAMIN D) 2000 UNITS CAPS, Take by mouth as directed. Take 1 tablet daily except none on saturdays and sundays, Disp: , Rfl:  .  Fe Fum-FePoly-Vit C-Vit B3 (INTEGRA) 62.5-62.5-40-3 MG CAPS, Take 1 capsule by mouth daily., Disp: 30 capsule, Rfl: 11 .  FIBER PO, Take 5 tablets by mouth daily as needed. Fiber choice, Disp: , Rfl:  .  metoCLOPramide (REGLAN) 10 MG tablet, TAKE 1 TABLET BY MOUTH AT BEDTIME, Disp: 90 tablet, Rfl: 5 .  mirabegron ER (MYRBETRIQ) 50 MG TB24 tablet, Take 1 tablet (50 mg total) by mouth daily., Disp: 21 tablet, Rfl: 0 .  Multiple Vitamins-Minerals (CENTRUM SILVER PO), Take 1 tablet by mouth daily. , Disp: , Rfl:  .  Omega-3 Fatty Acids (FISH OIL) 1000 MG CAPS, Take 3 capsules by mouth daily. , Disp: , Rfl:  .  omeprazole (PRILOSEC) 20 MG capsule, Take 1-2 capsules (20-40 mg total) by mouth daily. (Patient taking differently: Take 20 mg by mouth daily. ), Disp: 60 capsule, Rfl: 1 .  Polyethyl Glycol-Propyl Glycol (SYSTANE OP), Apply to eye. 1- 2 drop , Disp: , Rfl:  .  rosuvastatin (CRESTOR) 5 MG tablet, TAKE 1 TABLET ON MON, WED, FRIDAY, Disp: 15 tablet, Rfl: 2 .  Sennosides-Docusate Sodium (SENNA PLUS PO), Take by mouth as directed., Disp: , Rfl:   Vitals:   07/25/16 1619   Weight: 98 lb (44.5 kg)  Height: 5' 1"  (1.549 m)   Body mass index is 18.52 kg/m.   Assessment:  Underweight with current decrease in weight of 7#  Plan:  Eat 3 small meals daily and 3 snacks in between meals.  Discussed adding nuts, nut butters, dairy (milk and cheese)  and avocados to foods.  Drink Ensure or [employer] daily.  COntinue to eat a variety of fruits and vegetables Continue to exercise daily .  RTC in 2 weeks to check CBC per Dr Laurance Flatten and also follow up with him as planned in 1 month.

## 2016-07-25 NOTE — Patient Instructions (Addendum)
As you get older, you may start to lose weight, either through illness or loss of appetite. Maintaining a healthy body weight is important, and there are steps you can take to gain weight healthily. Even if there's nothing wrong with your health it's quite common for older people to lose their appetite. You may be underweight simply because you're not eating enough and your diet doesn't give you sufficient energy or calories.  Being underweight can be especially serious for older people. It increases your risk of health problems, including bone fracture if you fall. It weakens your immune system, leaving you more susceptible to infections, and it increases your risk of being deficient in important nutrients such as vitamins and minerals.  However, you can take steps to improve your diet and get the energy and nutrients you need. How to eat if you've lost your appetite As we get older, it is common for our appetite to get smaller and we may not feel like eating. If you're underweight and your appetite has decreased, it's still important to get all the energy and nutrients that your body needs. There are three ways to do this: Switch to smaller meals and frequent snacks, so that you're not struggling to eat three large meals a day.  Increase your calorie intake by eating foods, like milky puddings, or yoguts and cheesy main courses.  Avoid filling up on foods that are high in saturated fat or sugars, such as sugary soda or sugary drinks, cakes and biscuits.  Tips to boost your calorie intake Try these following healthy yet still high-energy meal and snack ideas: oatmeal made with whole (full-fat) milk, with fruit or dried fruit on top  peanut butter on gluten free toast  soups with vegetables or meats  milky drinks (Ensure or [employer] instant breakfast) as a bedtime snack  unsalted nuts  Add more calories from healthier foods to your diet to help you gain weight: Sprinkle grated cheese on savoury  dishes.  Add cheese or milk to soups.  Spread avocado on gluten free toast for a high-energy and healthy snack.  Eggs for breakfast 1-2 times per week Pour white sauce (made with butter, flour and milk) on fish or vegetables.  Replace one cup of tea or coffee each day with a cup of warm full-fat milk or hot chocolate Put milk or butter into mashed potato. .   3 Strategies to Help a Senior Gain Weight 1. Find foods that are easy to eat and digest.  2. Make sure these foods are nutritious and calorie-dense.  3. Prepare these foods so that he or she will look forward to every meal. 5 Healthy Foods That Promote Weight Gain 1. Nut butters are one of the easiest foods to incorporate into a senior's diet. Smooth and creamy, they're much easier to eat and digest than raw nuts. If your loved one has a hard time eating nut butters with crunchy vegetables like apples or celery, try mixing it into a hot bowl of oatmeal or spreading some on a slice of warm, crusty bread. Nut butters are also rich in monounsaturated fat, making them excellent for heart health and lowering blood pressure. Remember, you don't need to just stick to peanut butter. Almond butter and even cashew butter are great options as well.  2. Avocados are another high-calorie food many seniors love. Like nut butters, avocados have a rich and creamy consistency. Their high-fat content acts as an emulsifier, creating a thick texture when blended into a  variety of dishes. Don't think avocados are only for making guacamole! I like adding avocados to smoothies (trust me, your loved one won't even notice). You can also add avocados to salads or use them as a garnish with chili.  3. Coconut products have recently come into the spotlight as a new super-food to enjoy, thanks to the [employer]. Although coconut products are high in saturated fat, we're learning that this fat operates differently from the saturated fats found in animal products. So, when  you're making creamy soups and stews, try adding full-fat coconut milk to the pot. This is especially delicious in curry-based dishes, where the sweetness of the coconut milk offsets the curry's spicy notes.  4. Full-fat dairy products are another tasty, calorie-dense option. It takes an excess of 3,500 calories for a senior to gain one pound. This means that every calorie counts! Substituting skim milk and dairy products with their full-fat counterparts can easily add another 100-250 calories to your loved one's daily caloric intake. For instance, yogurt is an easy-to-eat source of healthy probiotics. Serve some with fruit for breakfast-or kick things up a notch and make a yummy parfait!  5. Potatoes and whole-grain starches consumed as part of a diet high in carbohydrates is one of the easiest ways for your loved one to gain weight. Compared to meats and fresh produce, carbs are some of the most affordable foods to stock up on. Try to serve one starch with each meal you make for your elder, and don't be afraid to get creative! Top baked potatoes with cheese and sour cream for additional calories, or toss penne in a creamy vodka sauce for a high-calorie dinner. Although starches might seem pretty bland at first, you can easily switch up the flavor profile to make dozens of different dishes, all with the same few ingredients. Additional Tips for Meals for Seniors Sprinkle flax or chia seeds into their cereal, oatmeal, yogurt, or even on fruit and vegetables salads for omega-3s and additional calories.  Double the amount of fruit in whatever smoothie you make them. Bananas are especially great for this.  Encourage them to eat smaller portions more often. A mid-morning snack and mid-afternoon snack can easily add another 300-500 calories to their diet.  Add cheese to sandwiches for an additional 100 calories or so.  Add a generous drizzle of olive oil to their salads for an extra 100-200 calories. Flax oil is  also a good option (as long as you keep it refrigerated and don't cook directly with it).  Pesto is an antioxidant-rich, high-calorie spread that can be used in a variety of dishes. Spread it on a sandwich instead of mustard, use it to make a pasta sauce, or pour it over chicken and fish dishes. Bonus points if you make it yourself!

## 2016-07-26 ENCOUNTER — Ambulatory Visit: Payer: Medicare Other | Admitting: Physical Therapy

## 2016-07-26 ENCOUNTER — Encounter: Payer: Self-pay | Admitting: Physical Therapy

## 2016-07-26 DIAGNOSIS — M25551 Pain in right hip: Secondary | ICD-10-CM | POA: Diagnosis not present

## 2016-07-26 NOTE — Therapy (Signed)
[facility] [place], [place], [address] Phone: [phone]   Fax:  [phone]  Physical Therapy Treatment  Patient Details  Name: Kristi Sawyer MRN: [id number] Date of Birth: 1926/07/14 Referring Provider: Gaynelle Arabian MD  Encounter Date: 07/26/2016      PT End of Session - 07/26/16 1032    Visit Number 3   Number of Visits 16   Date for PT Re-Evaluation 09/16/16   PT Start Time 4235   PT Stop Time 1111  2 units secondary to patient discomfort with positioning for manual therapy   PT Time Calculation (min) 39 min   Activity Tolerance Patient tolerated treatment well   Behavior During Therapy [facility] for tasks assessed/performed      Past Medical History:  Diagnosis Date  . Acid reflux   . Anemia   . Anxiety   . Cataracts, bilateral   . Celiac disease/sprue   . Chronic gastritis   . Esophageal stricture   . Euthyroid   . Gastritis   . Hiatal hernia   . Hyperlipidemia   . Hypermobility syndrome   . 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 Neuropathy ([place])   . Osteoporosis   . Palpitations   . Postmenopausal HRT (hormone replacement therapy)   . Shingles   . Thyroid tumor, benign     Past Surgical History:  Procedure Laterality Date  . ABDOMINAL HYSTERECTOMY    . APPENDECTOMY  1935  . CATARACT EXTRACTION Bilateral   . COLONOSCOPY    . ESOPHAGOGASTRODUODENOSCOPY    . EYE SURGERY    . MYOMECTOMY    . PARTIAL HYSTERECTOMY  1974   w 1/2 right ovary   . RETINAL LASER PROCEDURE    . SIGMOIDOSCOPY    . THYROIDECTOMY  1975   rt. benign tumor     There were no vitals filed for this visit.      Subjective Assessment - 07/26/16 1032    Subjective (P)  Reports that R hip feels a little uncomfortable today.   Patient Stated Goals Get out of pain.   Currently in Pain? (P)  Yes   Pain Score (P)  3    Pain Location (P)  Hip   Pain Orientation (P)  Right   Pain Descriptors / Indicators (P)   Discomfort   Pain Onset (P)  More than a month ago            [facility] PT Assessment - 07/26/16 0001      Assessment   Medical Diagnosis Lumbar DDD; right hamstring pain.     Restrictions   Weight Bearing Restrictions No                     [facility] Adult PT Treatment/Exercise - 07/26/16 0001      Modalities   Modalities Electrical Stimulation;Moist Heat;Ultrasound     Moist Heat Therapy   Number Minutes Moist Heat 15 Minutes   Moist Heat Location Hip     Electrical Stimulation   Electrical Stimulation Location R Piriformis/ HS   Electrical Stimulation Action Pre-Mod   Electrical Stimulation Parameters 80-150 hz x15 min   Electrical Stimulation Goals Pain     Ultrasound   Ultrasound Location R buttock   Ultrasound Parameters 1.2 w/cm2 ,50%, 1 mhz x10 min in L sidelying   Ultrasound Goals Pain     Manual Therapy   Manual Therapy Soft tissue mobilization   Soft tissue mobilization STW/MFR  to R Glute/Piriformis/ proximal HS attachment to decrease pain and tightness  in R sidelying                  PT Short Term Goals - 07/18/16 1100      PT SHORT TERM GOAL #1   Title STG's=LTG's.           PT Long Term Goals - 07/18/16 1102      PT LONG TERM GOAL #1   Title Independent with an HEP.   Time 8   Period Weeks   Status New     PT LONG TERM GOAL #2   Title Perform ADL's with pain not > 2/10.   Time 8   Period Weeks   Status New               Plan - 07/26/16 1111    Clinical Impression Statement Patient presented in clinic today with 3/10 uncomfortable sensation in R hip today. Minimal tightness noted in R Piriformis, Glute or proximal HS today. Patient reported discomfort in L sidelying today on her L hip and manual therapy was terminated at that time. Normal modalities response noted following removal of the modalities. Patient had no adverse reports following end of treatment. Goals remain on-going at this time secondary to HEP  not given at this time and pain experienced by patient.   Rehab Potential Excellent   PT Frequency 2x / week   PT Duration 8 weeks   PT Treatment/Interventions ADLs/Self Care Home Management;Cryotherapy;Electrical Stimulation;Ultrasound;Therapeutic activities;Therapeutic exercise;Patient/family education;Manual techniques   PT Next Visit Plan Continue with manual therapy and modalities per MPT POC as symptoms dictate. Provide glute/piriformis stretch HEP next treatment.   Consulted and Agree with Plan of Care Patient      Patient will benefit from skilled therapeutic intervention in order to improve the following deficits and impairments:  Pain, Decreased activity tolerance  Visit Diagnosis: Pain in right hip     Problem List Patient Active Problem List   Diagnosis Date Noted  . Overactive bladder 06/18/2015  . Hyperlipidemia 09/26/2010  . ANXIETY 09/26/2010  . GERD (gastroesophageal reflux disease) 09/26/2010  . CELIAC SPRUE 09/26/2010  . Osteoporosis 09/26/2010    Wynelle Fanny, PTA 07/26/2016, 11:25 AM  [facility] [address] [place], [place], [address] Phone: [phone]   Fax:  [phone]  Name: Kristi Sawyer MRN: [id number] Date of Birth: 1926-07-22

## 2016-07-28 LAB — FECAL OCCULT BLOOD, IMMUNOCHEMICAL: Fecal Occult Bld: NEGATIVE

## 2016-07-31 ENCOUNTER — Ambulatory Visit: Payer: Medicare Other | Admitting: Physical Therapy

## 2016-07-31 DIAGNOSIS — M25551 Pain in right hip: Secondary | ICD-10-CM

## 2016-07-31 NOTE — Therapy (Signed)
[facility] [place], [place], [address] Phone: [phone]   Fax:  [phone]  Physical Therapy Treatment  Patient Details  Name: Kristi Sawyer MRN: [id number] Date of Birth: 06-27-1926 Referring Provider: Gaynelle Arabian MD  Encounter Date: 07/31/2016      PT End of Session - 07/31/16 1249    Visit Number 4   Number of Visits 16   Date for PT Re-Evaluation 09/16/16   PT Start Time 1032   PT Stop Time 1130   PT Time Calculation (min) 58 min   Activity Tolerance Patient tolerated treatment well   Behavior During Therapy [facility] for tasks assessed/performed      Past Medical History:  Diagnosis Date  . Acid reflux   . Anemia   . Anxiety   . Cataracts, bilateral   . Celiac disease/sprue   . Chronic gastritis   . Esophageal stricture   . Euthyroid   . Gastritis   . Hiatal hernia   . Hyperlipidemia   . Hypermobility syndrome   . 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 Neuropathy ([place])   . Osteoporosis   . Palpitations   . Postmenopausal HRT (hormone replacement therapy)   . Shingles   . Thyroid tumor, benign     Past Surgical History:  Procedure Laterality Date  . ABDOMINAL HYSTERECTOMY    . APPENDECTOMY  1935  . CATARACT EXTRACTION Bilateral   . COLONOSCOPY    . ESOPHAGOGASTRODUODENOSCOPY    . EYE SURGERY    . MYOMECTOMY    . PARTIAL HYSTERECTOMY  1974   w 1/2 right ovary   . RETINAL LASER PROCEDURE    . SIGMOIDOSCOPY    . THYROIDECTOMY  1975   rt. benign tumor     There were no vitals filed for this visit.      Subjective Assessment - 07/31/16 1247    Subjective Patient states her hip is feeling pretty good today.   Patient Stated Goals Get out of pain.   Currently in Pain? No/denies                         [facility] Adult PT Treatment/Exercise - 07/31/16 0001      Exercises   Exercises Knee/Hip     Knee/Hip Exercises: Stretches   Active Hamstring Stretch 2 reps;30  seconds  with strap   ITB Stretch 2 reps;30 seconds  with strap   Piriformis Stretch Right;2 reps;30 seconds   Piriformis Stretch Limitations ankle over knee; crossed legs with LTR;      Modalities   Modalities Electrical Stimulation;Moist Heat;Ultrasound     Moist Heat Therapy   Number Minutes Moist Heat 15 Minutes   Moist Heat Location Hip     Electrical Stimulation   Electrical Stimulation Location R piriformis/HS    Electrical Stimulation Action premod   Electrical Stimulation Parameters 80-150 Hz to tolerance x 15 min   Electrical Stimulation Goals Pain     Ultrasound   Ultrasound Location R buttock   Ultrasound Parameters 1.2 W/cm2 1 mhz cont x 10 min   Ultrasound Goals Pain     Manual Therapy   Manual Therapy Soft tissue mobilization   Soft tissue mobilization to R gluteals/piriformis                  PT Short Term Goals - 07/18/16 1100      PT SHORT TERM GOAL #1   Title  STG's=LTG's.           PT Long Term Goals - 07/18/16 1102      PT LONG TERM GOAL #1   Title Independent with an HEP.   Time 8   Period Weeks   Status New     PT LONG TERM GOAL #2   Title Perform ADL's with pain not > 2/10.   Time 8   Period Weeks   Status New               Plan - 07/31/16 1251    Clinical Impression Statement Patient is progressing well with pain control reporting no pain today. She tolerated hip stretching well today including HS. She continues to have active trigger points in R piriformis and at HS insertion with palpation but tolerated manual therapy well. She would benefit from DN.    Rehab Potential Excellent   PT Frequency 2x / week   PT Duration 8 weeks   PT Treatment/Interventions ADLs/Self Care Home Management;Cryotherapy;Electrical Stimulation;Ultrasound;Therapeutic activities;Therapeutic exercise;Patient/family education;Manual techniques;Dry needling   PT Next Visit Plan Review HEP (stretches). Continue with manual therapy and  modalities per MPT POC as symptoms dictate. DN if approved. (recert for DN sent 02/20/29)   PT Home Exercise Plan piriformis, HS, ITB stretches   Consulted and Agree with Plan of Care Patient      Patient will benefit from skilled therapeutic intervention in order to improve the following deficits and impairments:  Pain, Decreased activity tolerance  Visit Diagnosis: Pain in right hip - Plan: PT plan of care cert/re-cert     Problem List Patient Active Problem List   Diagnosis Date Noted  . Overactive bladder 06/18/2015  . Hyperlipidemia 09/26/2010  . ANXIETY 09/26/2010  . GERD (gastroesophageal reflux disease) 09/26/2010  . CELIAC SPRUE 09/26/2010  . Osteoporosis 09/26/2010   Madelyn Flavors PT 07/31/2016, [facility] [address] [place], [place], [address] Phone: [phone]   Fax:  [phone]  Name: Kristi Sawyer MRN: [id number] Date of Birth: 02/12/1926

## 2016-07-31 NOTE — Patient Instructions (Addendum)
  Hamstring Stretch, Reclined (Strap, Doorframe)   Lengthen bottom leg on floor. Extend top leg along edge of doorframe or press foot up into yoga strap. Hold for 30 seconds. Repeat 3_ times each leg.  Outer Hip Stretch: Reclined IT Band Stretch (Strap)   Strap around opposite foot, pull across only as far as possible with shoulders on mat. Hold for __30__ seconds. Repeat __3__ times each leg. 2-3 x/day.   Piriformis (Supine)  Cross legs, right on top. Gently pull other knee toward chest until stretch is felt in buttock/hip of top leg. Hold ____ seconds. Repeat ____ times per set. Do ____ sets per session. Do ____ sessions per day.    Trigger Point Dry Needling  . What is Trigger Point Dry Needling (DN)? o DN is a physical therapy technique used to treat muscle pain and dysfunction. Specifically, DN helps deactivate muscle trigger points (muscle knots).  o A thin filiform needle is used to penetrate the skin and stimulate the underlying trigger point. The goal is for a local twitch response (LTR) to occur and for the trigger point to relax. No medication of any kind is injected during the procedure.   . What Does Trigger Point Dry Needling Feel Like?  o The procedure feels different for each individual patient. Some patients report that they do not actually feel the needle enter the skin and overall the process is not painful. Very mild bleeding may occur. However, many patients feel a deep cramping in the muscle in which the needle was inserted. This is the local twitch response.   Marland Kitchen How Will I feel after the treatment? o Soreness is normal, and the onset of soreness may not occur for a few hours. Typically this soreness does not last longer than two days.  o Bruising is uncommon, however; ice can be used to decrease any possible bruising.  o In rare cases feeling tired or nauseous after the treatment is normal. In addition, your symptoms may get worse before they get better, this  period will typically not last longer than 24 hours.   . What Can I do After My Treatment? o Increase your hydration by drinking more water for the next 24 hours. o You may place ice or heat on the areas treated that have become sore, however, do not use heat on inflamed or bruised areas. Heat often brings more relief post needling. o You can continue your regular activities, but vigorous activity is not recommended initially after the treatment for 24 hours. o DN is best combined with other physical therapy such as strengthening, stretching, and other therapies.    Madelyn Flavors, PT 07/31/16 11:10 AM [facility] [address] [place], [place], [address] Phone: [phone]   Fax:  [phone]

## 2016-08-02 ENCOUNTER — Ambulatory Visit: Payer: Medicare Other | Admitting: Physical Therapy

## 2016-08-02 DIAGNOSIS — M25551 Pain in right hip: Secondary | ICD-10-CM

## 2016-08-02 NOTE — Therapy (Signed)
[facility] [place], [place], [address] Phone: [phone]   Fax:  [phone]  Physical Therapy Treatment  Patient Details  Name: Kristi Sawyer MRN: [id number] Date of Birth: 08-03-1926 Referring Provider: Gaynelle Arabian MD  Encounter Date: 08/02/2016      PT End of Session - 08/02/16 1226    Visit Number 5   Number of Visits 16   Date for PT Re-Evaluation 09/16/16   PT Start Time 1030   PT Stop Time 1115   PT Time Calculation (min) 45 min   Activity Tolerance Patient tolerated treatment well   Behavior During Therapy [facility] for tasks assessed/performed      Past Medical History:  Diagnosis Date  . Acid reflux   . Anemia   . Anxiety   . Cataracts, bilateral   . Celiac disease/sprue   . Chronic gastritis   . Esophageal stricture   . Euthyroid   . Gastritis   . Hiatal hernia   . Hyperlipidemia   . Hypermobility syndrome   . 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 Neuropathy ([place])   . Osteoporosis   . Palpitations   . Postmenopausal HRT (hormone replacement therapy)   . Shingles   . Thyroid tumor, benign     Past Surgical History:  Procedure Laterality Date  . ABDOMINAL HYSTERECTOMY    . APPENDECTOMY  1935  . CATARACT EXTRACTION Bilateral   . COLONOSCOPY    . ESOPHAGOGASTRODUODENOSCOPY    . EYE SURGERY    . MYOMECTOMY    . PARTIAL HYSTERECTOMY  1974   w 1/2 right ovary   . RETINAL LASER PROCEDURE    . SIGMOIDOSCOPY    . THYROIDECTOMY  1975   rt. benign tumor     There were no vitals filed for this visit.      Subjective Assessment - 08/02/16 1226    Subjective Those stretches helped.   Patient Stated Goals Get out of pain.   Pain Score 3    Pain Location Hip   Pain Orientation Right   Pain Descriptors / Indicators Aching;Dull   Pain Frequency Constant     Treatment:  HMP and constant Pre-mod x 10 minutes to affected right buttock f/b U/S at 1.50 W/CM2 x 12 minutes f/b STW/M x  12 minutes (left sdly position with pillow between knees for comfort).  Patient felt better after treatment.                              PT Short Term Goals - 07/18/16 1100      PT SHORT TERM GOAL #1   Title STG's=LTG's.           PT Long Term Goals - 07/18/16 1102      PT LONG TERM GOAL #1   Title Independent with an HEP.   Time 8   Period Weeks   Status New     PT LONG TERM GOAL #2   Title Perform ADL's with pain not > 2/10.   Time 8   Period Weeks   Status New             Patient will benefit from skilled therapeutic intervention in order to improve the following deficits and impairments:  Pain, Decreased activity tolerance  Visit Diagnosis: Pain in right hip     Problem List Patient Active Problem List   Diagnosis Date Noted  .  Overactive bladder 06/18/2015  . Hyperlipidemia 09/26/2010  . ANXIETY 09/26/2010  . GERD (gastroesophageal reflux disease) 09/26/2010  . CELIAC SPRUE 09/26/2010  . Osteoporosis 09/26/2010    Darleene Cumpian, [place] MPT 08/02/2016, 12:37 PM  [facility] [address] [place], [place], [address] Phone: [phone]   Fax:  [phone]  Name: Kristi Sawyer MRN: [id number] Date of Birth: 1926/06/11

## 2016-08-07 ENCOUNTER — Ambulatory Visit: Payer: Medicare Other | Admitting: Physical Therapy

## 2016-08-07 DIAGNOSIS — M25551 Pain in right hip: Secondary | ICD-10-CM | POA: Diagnosis not present

## 2016-08-07 NOTE — Therapy (Signed)
[facility] [place], [place], [address] Phone: [phone]   Fax:  [phone]  Physical Therapy Treatment  Patient Details  Name: Kristi Sawyer MRN: [id number] Date of Birth: Oct 17, 1926 Referring Provider: Gaynelle Arabian MD  Encounter Date: 08/07/2016      PT End of Session - 08/07/16 1414    Visit Number 6   Number of Visits 16   Date for PT Re-Evaluation 09/16/16   PT Start Time 1033   PT Stop Time 1123   PT Time Calculation (min) 50 min   Activity Tolerance Patient tolerated treatment well   Behavior During Therapy [facility] for tasks assessed/performed      Past Medical History:  Diagnosis Date  . Acid reflux   . Anemia   . Anxiety   . Cataracts, bilateral   . Celiac disease/sprue   . Chronic gastritis   . Esophageal stricture   . Euthyroid   . Gastritis   . Hiatal hernia   . Hyperlipidemia   . Hypermobility syndrome   . 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 Neuropathy ([place])   . Osteoporosis   . Palpitations   . Postmenopausal HRT (hormone replacement therapy)   . Shingles   . Thyroid tumor, benign     Past Surgical History:  Procedure Laterality Date  . ABDOMINAL HYSTERECTOMY    . APPENDECTOMY  1935  . CATARACT EXTRACTION Bilateral   . COLONOSCOPY    . ESOPHAGOGASTRODUODENOSCOPY    . EYE SURGERY    . MYOMECTOMY    . PARTIAL HYSTERECTOMY  1974   w 1/2 right ovary   . RETINAL LASER PROCEDURE    . SIGMOIDOSCOPY    . THYROIDECTOMY  1975   rt. benign tumor     There were no vitals filed for this visit.      Subjective Assessment - 08/07/16 1413    Subjective These treatments have helped a lot.   Pain Score 1    Pain Location Hip   Pain Orientation Right   Pain Descriptors / Indicators Aching;Dull   Pain Onset More than a month ago     Treatment:  Left sdly position with folded pillow between knees for comfort while patient received U/S to affected right gluteal region at 1.50  W/CM2 x 12 minutes f/b STW/M x 14 minutes f/b IFC with HMP x 15 minutes.  No pain after treatment.                              PT Short Term Goals - 07/18/16 1100      PT SHORT TERM GOAL #1   Title STG's=LTG's.           PT Long Term Goals - 07/18/16 1102      PT LONG TERM GOAL #1   Title Independent with an HEP.   Time 8   Period Weeks   Status New     PT LONG TERM GOAL #2   Title Perform ADL's with pain not > 2/10.   Time 8   Period Weeks   Status New             Patient will benefit from skilled therapeutic intervention in order to improve the following deficits and impairments:  Pain, Decreased activity tolerance  Visit Diagnosis: Pain in right hip     Problem List Patient Active Problem List   Diagnosis Date Noted  .  Overactive bladder 06/18/2015  . Hyperlipidemia 09/26/2010  . ANXIETY 09/26/2010  . GERD (gastroesophageal reflux disease) 09/26/2010  . CELIAC SPRUE 09/26/2010  . Osteoporosis 09/26/2010    Ashlynn Gunnels, [place] 08/07/2016, 2:16 PM  [facility] [address] [place], [place], [address] Phone: [phone]   Fax:  [phone]  Name: Kristi Sawyer MRN: [id number] Date of Birth: [birth year]

## 2016-08-08 ENCOUNTER — Other Ambulatory Visit: Payer: Medicare Other

## 2016-08-08 DIAGNOSIS — D72829 Elevated white blood cell count, unspecified: Secondary | ICD-10-CM | POA: Diagnosis not present

## 2016-08-08 LAB — CBC WITH DIFFERENTIAL/PLATELET
BASOS: 0 %
Basophils Absolute: 0 10*3/uL (ref 0.0–0.2)
EOS (ABSOLUTE): 0.1 10*3/uL (ref 0.0–0.4)
EOS: 2 %
HEMOGLOBIN: 13.1 g/dL (ref 11.1–15.9)
Hematocrit: 39.6 % (ref 34.0–46.6)
IMMATURE GRANS (ABS): 0 10*3/uL (ref 0.0–0.1)
IMMATURE GRANULOCYTES: 0 %
Lymphocytes Absolute: 3.1 10*3/uL (ref 0.7–3.1)
Lymphs: 41 %
MCH: 31.3 pg (ref 26.6–33.0)
MCHC: 33.1 g/dL (ref 31.5–35.7)
MCV: 95 fL (ref 79–97)
MONOCYTES: 10 %
MONOS ABS: 0.8 10*3/uL (ref 0.1–0.9)
NEUTROS PCT: 47 %
Neutrophils Absolute: 3.6 10*3/uL (ref 1.4–7.0)
Platelets: 258 10*3/uL (ref 150–379)
RBC: 4.19 x10E6/uL (ref 3.77–5.28)
RDW: 14.1 % (ref 12.3–15.4)
WBC: 7.5 10*3/uL (ref 3.4–10.8)

## 2016-08-09 ENCOUNTER — Encounter: Payer: Self-pay | Admitting: Physical Therapy

## 2016-08-09 ENCOUNTER — Ambulatory Visit: Payer: Medicare Other | Admitting: Physical Therapy

## 2016-08-09 DIAGNOSIS — M25551 Pain in right hip: Secondary | ICD-10-CM

## 2016-08-09 NOTE — Therapy (Signed)
[facility] [place], [place], [address] Phone: [phone]   Fax:  [phone]  Physical Therapy Treatment  Patient Details  Name: Kristi Sawyer MRN: [id number] Date of Birth: 1925-12-03 Referring Provider: Gaynelle Arabian MD  Encounter Date: 08/09/2016      PT End of Session - 08/09/16 1133    Visit Number 7   Number of Visits 16   Date for PT Re-Evaluation 09/16/16   PT Start Time 1033   PT Stop Time 1116   PT Time Calculation (min) 43 min   Activity Tolerance Patient tolerated treatment well   Behavior During Therapy [facility] for tasks assessed/performed      Past Medical History:  Diagnosis Date  . Acid reflux   . Anemia   . Anxiety   . Cataracts, bilateral   . Celiac disease/sprue   . Chronic gastritis   . Esophageal stricture   . Euthyroid   . Gastritis   . Hiatal hernia   . Hyperlipidemia   . Hypermobility syndrome   . 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 Neuropathy ([place])   . Osteoporosis   . Palpitations   . Postmenopausal HRT (hormone replacement therapy)   . Shingles   . Thyroid tumor, benign     Past Surgical History:  Procedure Laterality Date  . ABDOMINAL HYSTERECTOMY    . APPENDECTOMY  1935  . CATARACT EXTRACTION Bilateral   . COLONOSCOPY    . ESOPHAGOGASTRODUODENOSCOPY    . EYE SURGERY    . MYOMECTOMY    . PARTIAL HYSTERECTOMY  1974   w 1/2 right ovary   . RETINAL LASER PROCEDURE    . SIGMOIDOSCOPY    . THYROIDECTOMY  1975   rt. benign tumor     There were no vitals filed for this visit.      Subjective Assessment - 08/09/16 1133    Subjective Reports that pain is now almost nonexistant and she can now cross her legs although she still has difficulty with vacuuming. "I clean a little and sit a lot."   Patient Stated Goals Get out of pain.   Currently in Pain? No/denies            [facility] PT Assessment - 08/09/16 0001      Assessment   Medical Diagnosis Lumbar  DDD; right hamstring pain.     Restrictions   Weight Bearing Restrictions No                     [facility] Adult PT Treatment/Exercise - 08/09/16 0001      Modalities   Modalities Electrical Stimulation;Moist Heat;Ultrasound     Moist Heat Therapy   Number Minutes Moist Heat 15 Minutes   Moist Heat Location Hip     Electrical Stimulation   Electrical Stimulation Location R piriformis/ proximal HS    Electrical Stimulation Action Pre-mod   Electrical Stimulation Parameters 80-150 hz x15 min   Electrical Stimulation Goals Pain     Ultrasound   Ultrasound Location R buttock/ proximal HS   Ultrasound Parameters 1.0 w/cm2, 50%, 3.3 mhz x10 min  parameters secondary to patient being osteoporotic   Ultrasound Goals Pain     Manual Therapy   Manual Therapy Soft tissue mobilization   Soft tissue mobilization STW/MFR to R piriformis/ glute/ proximal HS to decrease tightness and pain in L sidelying  PT Short Term Goals - 07/18/16 1100      PT SHORT TERM GOAL #1   Title STG's=LTG's.           PT Long Term Goals - 08/09/16 1034      PT LONG TERM GOAL #1   Title Independent with an HEP.   Time 8   Period Weeks   Status Achieved     PT LONG TERM GOAL #2   Title Perform ADL's with pain not > 2/10.   Time 8   Period Weeks   Status Partially Met  Continues to have some difficulty with vacuuming. "I clean a little and sit a lot" 08/09/2016               Plan - 08/09/16 1135    Clinical Impression Statement Patient continues to progress overall as she now has very little to non existant pain although certain activities such as vacuuming still troubles her. Minimal increased tightness noted in R proximal HS and Piriformis musculature. Normal modalities response noted following removal of the modalities. No adverse reports were communicated by patient in regards to positiioning during today's treatment. Patient has achieved or partially  achieved LT goals set at evaluation at this time. Patient did not report any adverse discomfort or pain following today's treatment.    Rehab Potential Excellent   PT Frequency 2x / week   PT Duration 8 weeks   PT Treatment/Interventions ADLs/Self Care Home Management;Cryotherapy;Electrical Stimulation;Ultrasound;Therapeutic activities;Therapeutic exercise;Patient/family education;Manual techniques;Dry needling   PT Next Visit Plan Review HEP (stretches). Continue with manual therapy and modalities per MPT POC as symptoms dictate. DN if approved. (recert for DN sent 6/39/43)   PT Home Exercise Plan piriformis, HS, ITB stretches   Consulted and Agree with Plan of Care Patient      Patient will benefit from skilled therapeutic intervention in order to improve the following deficits and impairments:  Pain, Decreased activity tolerance  Visit Diagnosis: Pain in right hip     Problem List Patient Active Problem List   Diagnosis Date Noted  . Overactive bladder 06/18/2015  . Hyperlipidemia 09/26/2010  . ANXIETY 09/26/2010  . GERD (gastroesophageal reflux disease) 09/26/2010  . CELIAC SPRUE 09/26/2010  . Osteoporosis 09/26/2010    Kristi Sawyer, PTA 08/09/2016, 11:48 AM  [facility] [address] [place], [place], [address] Phone: [phone]   Fax:  [phone]  Name: Kristi Sawyer MRN: [id number] Date of Birth: 1926-02-27

## 2016-08-14 ENCOUNTER — Ambulatory Visit: Payer: Medicare Other | Attending: Orthopedic Surgery | Admitting: Physical Therapy

## 2016-08-14 ENCOUNTER — Encounter: Payer: Self-pay | Admitting: Physical Therapy

## 2016-08-14 DIAGNOSIS — M25551 Pain in right hip: Secondary | ICD-10-CM | POA: Diagnosis not present

## 2016-08-14 NOTE — Therapy (Signed)
[facility] [place], [place], [address] Phone: [phone]   Fax:  [phone]  Physical Therapy Treatment  Patient Details  Name: Kristi Sawyer MRN: [id number] Date of Birth: 1926-06-27 Referring Provider: Gaynelle Arabian MD  Encounter Date: 08/14/2016      PT End of Session - 08/14/16 1112    Visit Number 8   Number of Visits 16   Date for PT Re-Evaluation 09/16/16   PT Start Time 1118   PT Stop Time 1203   PT Time Calculation (min) 45 min   Activity Tolerance Patient tolerated treatment well   Behavior During Therapy [facility] for tasks assessed/performed      Past Medical History:  Diagnosis Date  . Acid reflux   . Anemia   . Anxiety   . Cataracts, bilateral   . Celiac disease/sprue   . Chronic gastritis   . Esophageal stricture   . Euthyroid   . Gastritis   . Hiatal hernia   . Hyperlipidemia   . Hypermobility syndrome   . 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 Neuropathy ([place])   . Osteoporosis   . Palpitations   . Postmenopausal HRT (hormone replacement therapy)   . Shingles   . Thyroid tumor, benign     Past Surgical History:  Procedure Laterality Date  . ABDOMINAL HYSTERECTOMY    . APPENDECTOMY  1935  . CATARACT EXTRACTION Bilateral   . COLONOSCOPY    . ESOPHAGOGASTRODUODENOSCOPY    . EYE SURGERY    . MYOMECTOMY    . PARTIAL HYSTERECTOMY  1974   w 1/2 right ovary   . RETINAL LASER PROCEDURE    . SIGMOIDOSCOPY    . THYROIDECTOMY  1975   rt. benign tumor     There were no vitals filed for this visit.      Subjective Assessment - 08/14/16 1112    Subjective Denies any pain upon arrival and can cross her legs now.   Patient Stated Goals Get out of pain.   Currently in Pain? No/denies            [facility] PT Assessment - 08/14/16 0001      Assessment   Medical Diagnosis Lumbar DDD; right hamstring pain.     Restrictions   Weight Bearing Restrictions No                      [facility] Adult PT Treatment/Exercise - 08/14/16 0001      Knee/Hip Exercises: Stretches   Active Hamstring Stretch Right;3 reps;30 seconds   Piriformis Stretch Right;3 reps;30 seconds   Other Knee/Hip Stretches R SKTC 3x30 sec     Knee/Hip Exercises: Aerobic   Nustep L4 x10 min     Knee/Hip Exercises: Supine   Bridges Limitations 2x10 reps   Straight Leg Raises AROM;Both;2 sets;10 reps     Knee/Hip Exercises: Sidelying   Clams x20 reps BLE     Modalities   Modalities Electrical Stimulation;Moist Heat     Moist Heat Therapy   Number Minutes Moist Heat 15 Minutes   Moist Heat Location Hip     Electrical Stimulation   Electrical Stimulation Location B low back   Electrical Stimulation Action Pre-Mod   Electrical Stimulation Parameters 80-150 hz x15 min   Electrical Stimulation Goals Pain                  PT Short Term Goals - 07/18/16 1100  PT SHORT TERM GOAL #1   Title STG's=LTG's.           PT Long Term Goals - 08/09/16 1034      PT LONG TERM GOAL #1   Title Independent with an HEP.   Time 8   Period Weeks   Status Achieved     PT LONG TERM GOAL #2   Title Perform ADL's with pain not > 2/10.   Time 8   Period Weeks   Status Partially Met  Continues to have some difficulty with vacuuming. "I clean a little and sit a lot" 08/09/2016               Plan - 08/14/16 1150    Clinical Impression Statement Patient continues to tolerate treatments well as patient arrived with no reports of pain. Patient able to walk recreationally and complete housework without hip pain or discomfort per patient report. Patient reports some discomfort with vacuuming but no other complaints per patient report. Able to complete all therapeutic exercises as directed with no pain per patient report. Patient encouraged to continue HEP stretches and to contact her PCP if pain returns at anytime. Patient has achieved or partially achieved all  LT goals set at evaluation at this time. Normal modalities response noted following removal of the modalities.   Rehab Potential Excellent   PT Frequency 2x / week   PT Duration 8 weeks   PT Treatment/Interventions ADLs/Self Care Home Management;Cryotherapy;Electrical Stimulation;Ultrasound;Therapeutic activities;Therapeutic exercise;Patient/family education;Manual techniques;Dry needling   PT Next Visit Plan D/C summary required as patient has no pain and met or partially achieved all goals.   PT Home Exercise Plan piriformis, HS, ITB stretches   Consulted and Agree with Plan of Care Patient      Patient will benefit from skilled therapeutic intervention in order to improve the following deficits and impairments:  Pain, Decreased activity tolerance  Visit Diagnosis: Pain in right hip     Problem List Patient Active Problem List   Diagnosis Date Noted  . Overactive bladder 06/18/2015  . Hyperlipidemia 09/26/2010  . ANXIETY 09/26/2010  . GERD (gastroesophageal reflux disease) 09/26/2010  . CELIAC SPRUE 09/26/2010  . Osteoporosis 09/26/2010    Wynelle Fanny, PTA 08/14/2016, 12:06 PM  [facility] [address] [place], [place], [address] Phone: [phone]   Fax:  [phone]  Name: Kristi Sawyer MRN: [id number] Date of Birth: 10-08-1926 PHYSICAL THERAPY DISCHARGE SUMMARY  Visits from Start of Care: 8.  Current functional level related to goals / functional outcomes: Please see above.   Remaining deficits: Mild pain remaining.   Education / Equipment: HEP. Plan: Patient agrees to discharge.  Patient goals were partially met. Patient is being discharged due to being pleased with the current functional level.  ?????        Mali Applegate MPT

## 2016-08-16 ENCOUNTER — Encounter: Payer: Medicare Other | Admitting: Physical Therapy

## 2016-08-22 ENCOUNTER — Encounter: Payer: Self-pay | Admitting: Family Medicine

## 2016-08-22 ENCOUNTER — Ambulatory Visit (INDEPENDENT_AMBULATORY_CARE_PROVIDER_SITE_OTHER): Payer: Medicare Other | Admitting: Family Medicine

## 2016-08-22 VITALS — BP 144/82 | HR 69 | Temp 97.6°F | Ht 61.0 in | Wt 98.6 lb

## 2016-08-22 DIAGNOSIS — M4716 Other spondylosis with myelopathy, lumbar region: Secondary | ICD-10-CM | POA: Diagnosis not present

## 2016-08-22 DIAGNOSIS — R634 Abnormal weight loss: Secondary | ICD-10-CM

## 2016-08-22 DIAGNOSIS — Z23 Encounter for immunization: Secondary | ICD-10-CM

## 2016-08-22 NOTE — Patient Instructions (Signed)
Continue with physical therapy exercises at home Continue with in sure Continue with multi-vitamin Get back in touch with [place] if you develop any symptoms with respiratory are intestinal track Come by in about 4 weeks for the nurse to get a weight

## 2016-08-22 NOTE — Progress Notes (Signed)
Subjective:    Patient ID: Kristi Sawyer, female    DOB: 02-Jul-1926, 82 y.o.   MRN: [id number]  HPI Patient here today for 1 month follow up on weight management and back pain. She states that her back pain is much better since the physical therapy and her weight has not dropped since the last office visit. There was concern from the last visit that the patient had lost about 7 pounds of weight and for no known reason. She is also having back pain and she was just starting physical therapy. Since the last visit she has gained about a half of a pound. She still feels well and with the physical therapy is doing better with her back. She will go ahead and get her flu shot today. The patient has been drinking in sure. She still has no complaints with chest pain shortness of breath problems with her stoma nausea vomiting diarrhea or change in bowel habits. She is passing her water without problems.    Patient Active Problem List   Diagnosis Date Noted  . Overactive bladder 06/18/2015  . Hyperlipidemia 09/26/2010  . ANXIETY 09/26/2010  . GERD (gastroesophageal reflux disease) 09/26/2010  . CELIAC SPRUE 09/26/2010  . Osteoporosis 09/26/2010   Outpatient Encounter Prescriptions as of 08/22/2016  Medication Sig  . aspirin (SB LOW DOSE ASA EC) 81 MG EC tablet Take 81 mg by mouth daily.    . Calcium Carbonate-Vit D-Min (CALTRATE PLUS PO) Take 1 tablet by mouth daily.   . Cholecalciferol (VITAMIN D) 2000 UNITS CAPS Take by mouth as directed. Take 1 tablet daily except none on saturdays and sundays  . Fe Fum-FePoly-Vit C-Vit B3 (INTEGRA) 62.5-62.5-40-3 MG CAPS Take 1 capsule by mouth daily.  Marland Kitchen FIBER PO Take 5 tablets by mouth daily as needed. Fiber choice  . metoCLOPramide (REGLAN) 10 MG tablet TAKE 1 TABLET BY MOUTH AT BEDTIME  . mirabegron ER (MYRBETRIQ) 50 MG TB24 tablet Take 1 tablet (50 mg total) by mouth daily.  . Multiple Vitamins-Minerals (CENTRUM SILVER PO) Take 1 tablet by mouth daily.   .  Omega-3 Fatty Acids (FISH OIL) 1000 MG CAPS Take 3 capsules by mouth daily.   Marland Kitchen omeprazole (PRILOSEC) 20 MG capsule Take 1-2 capsules (20-40 mg total) by mouth daily. (Patient taking differently: Take 20 mg by mouth daily. )  . Polyethyl Glycol-Propyl Glycol (SYSTANE OP) Apply to eye. 1- 2 drop   . rosuvastatin (CRESTOR) 5 MG tablet TAKE 1 TABLET ON MON, WED, FRIDAY  . Sennosides-Docusate Sodium (SENNA PLUS PO) Take by mouth as directed.   No facility-administered encounter medications on file as of 08/22/2016.       Review of Systems  Constitutional: Negative.   HENT: Negative.   Eyes: Negative.   Respiratory: Negative.   Cardiovascular: Negative.   Gastrointestinal: Negative.   Endocrine: Negative.   Genitourinary: Negative.   Musculoskeletal: Negative.   Skin: Negative.   Allergic/Immunologic: Negative.   Neurological: Negative.   Hematological: Negative.   Psychiatric/Behavioral: Negative.        Objective:   Physical Exam  Constitutional: She is oriented to person, place, and time. No distress.  HENT:  Head: Normocephalic and atraumatic.  Eyes: Conjunctivae and EOM are normal. Pupils are equal, round, and reactive to light. Right eye exhibits no discharge. Left eye exhibits no discharge. No scleral icterus.  Neck: Normal range of motion. Neck supple. No thyromegaly present.  Cardiovascular: Normal rate, regular rhythm and normal heart sounds.   No  murmur heard. Pulmonary/Chest: Effort normal and breath sounds normal. No respiratory distress. She has no wheezes. She has no rales.  No axillary adenopathy  Abdominal: Soft. Bowel sounds are normal. She exhibits no mass. There is no tenderness. There is no rebound and no guarding.  Abdomen is soft without liver or spleen enlargement or masses. There is no inguinal adenopathy.  Musculoskeletal: Normal range of motion. She exhibits no edema.  Lymphadenopathy:    She has no cervical adenopathy.  Neurological: She is alert and  oriented to person, place, and time.  Skin: Skin is warm and dry. No rash noted.  Psychiatric: She has a normal mood and affect. Her behavior is normal. Judgment and thought content normal.  Nursing note and vitals reviewed.  BP (!) 144/82 (BP Location: Left Arm)   Pulse 69   Temp 97.6 F (36.4 C) (Oral)   Ht 5' 1"  (1.549 m)   Wt 98 lb 9.6 oz (44.7 kg)   BMI 18.63 kg/m         Assessment & Plan:  1. Loss of weight -Continue with in sure and multivitamin  2. Lumbar spondylosis with myelopathy -Continue with exercises from physical therapy and make every effort to not fall and to move slowly  Patient Instructions  Continue with physical therapy exercises at home Continue with in sure Continue with multi-vitamin Get back in touch with [place] if you develop any symptoms with respiratory are intestinal track Come by in about 4 weeks for the nurse to get a weight  Arrie Senate MD

## 2016-09-02 ENCOUNTER — Other Ambulatory Visit: Payer: Self-pay | Admitting: Family Medicine

## 2016-09-11 ENCOUNTER — Other Ambulatory Visit: Payer: Self-pay | Admitting: *Deleted

## 2016-09-11 MED ORDER — ROSUVASTATIN CALCIUM 5 MG PO TABS: ORAL_TABLET | ORAL | 0 refills | Status: DC

## 2016-09-19 DIAGNOSIS — H35351 Cystoid macular degeneration, right eye: Secondary | ICD-10-CM | POA: Diagnosis not present

## 2016-09-19 DIAGNOSIS — H35371 Puckering of macula, right eye: Secondary | ICD-10-CM | POA: Diagnosis not present

## 2016-09-19 DIAGNOSIS — H35372 Puckering of macula, left eye: Secondary | ICD-10-CM | POA: Diagnosis not present

## 2016-09-19 DIAGNOSIS — H43812 Vitreous degeneration, left eye: Secondary | ICD-10-CM | POA: Diagnosis not present

## 2016-09-21 ENCOUNTER — Other Ambulatory Visit: Payer: Self-pay | Admitting: Internal Medicine

## 2016-09-21 NOTE — Telephone Encounter (Signed)
May I refill Sir?

## 2016-09-22 ENCOUNTER — Ambulatory Visit (INDEPENDENT_AMBULATORY_CARE_PROVIDER_SITE_OTHER): Payer: Medicare Other | Admitting: *Deleted

## 2016-09-22 VITALS — BP 157/78 | HR 67 | Wt 97.8 lb

## 2016-09-22 DIAGNOSIS — R634 Abnormal weight loss: Secondary | ICD-10-CM

## 2016-09-22 MED ORDER — MIRABEGRON ER 50 MG PO TB24: 50.0000 mg | ORAL_TABLET | Freq: Every day | ORAL | 0 refills | Status: DC

## 2016-09-22 NOTE — Telephone Encounter (Signed)
OK x 3   Looks like I need to have her come in for an appt for more so have her set that up before we refill

## 2016-09-22 NOTE — Progress Notes (Signed)
Pt in for weight check today per Dr. Tawanna Sat request. Pt is taking Ensure before her meals, she is currently maintaining her weight.

## 2016-09-22 NOTE — Telephone Encounter (Signed)
Have called and left her a detailed message to call and set up appointment so we can continue to refill her Reglan.

## 2016-10-17 ENCOUNTER — Ambulatory Visit (INDEPENDENT_AMBULATORY_CARE_PROVIDER_SITE_OTHER): Payer: Medicare Other | Admitting: Internal Medicine

## 2016-10-17 ENCOUNTER — Encounter: Payer: Self-pay | Admitting: Internal Medicine

## 2016-10-17 VITALS — BP 158/76 | HR 80 | Ht 61.0 in | Wt 99.4 lb

## 2016-10-17 DIAGNOSIS — K900 Celiac disease: Secondary | ICD-10-CM | POA: Diagnosis not present

## 2016-10-17 DIAGNOSIS — K219 Gastro-esophageal reflux disease without esophagitis: Secondary | ICD-10-CM | POA: Diagnosis not present

## 2016-10-17 NOTE — Patient Instructions (Signed)
    Glad things are going well.  I will see you in a year - call if needed before.  I appreciate the opportunity to care for you. Gatha Mayer, MD, [facility]

## 2016-10-17 NOTE — Progress Notes (Signed)
   Kristi Sawyer 82 y.o. 10-18-1926 [id number]  Assessment & Plan:   Encounter Diagnoses  Name Primary?  . Gastroesophageal reflux disease, esophagitis presence not specified Yes  . Celiac disease    Doing very well - continue current meds and diet RTC 1 year    Subjective:   Chief Complaint: f/u GERD and celiac dz  HPI Yearly f/u GERD and celiac dz No sxs  on PPI, qhs reglan and gluten-free diet  Medications, allergies, past medical history, past surgical history, family history and social history are reviewed and updated in the EMR.  Review of Systems Still very functional - soon to be 89  Objective:   Physical Exam BP (!) 158/76   Pulse 80   Ht 5' 1"  (1.549 m)   Wt 99 lb 6 oz (45.1 kg)   BMI 18.78 kg/m  NAD

## 2016-10-18 ENCOUNTER — Encounter: Payer: Self-pay | Admitting: Internal Medicine

## 2016-11-23 ENCOUNTER — Ambulatory Visit (INDEPENDENT_AMBULATORY_CARE_PROVIDER_SITE_OTHER): Payer: Medicare Other | Admitting: Family Medicine

## 2016-11-23 ENCOUNTER — Encounter: Payer: Self-pay | Admitting: Family Medicine

## 2016-11-23 VITALS — BP 139/66 | HR 75 | Temp 98.1°F | Ht 61.0 in | Wt 100.0 lb

## 2016-11-23 DIAGNOSIS — M4716 Other spondylosis with myelopathy, lumbar region: Secondary | ICD-10-CM | POA: Diagnosis not present

## 2016-11-23 DIAGNOSIS — E559 Vitamin D deficiency, unspecified: Secondary | ICD-10-CM

## 2016-11-23 DIAGNOSIS — K219 Gastro-esophageal reflux disease without esophagitis: Secondary | ICD-10-CM

## 2016-11-23 DIAGNOSIS — N3281 Overactive bladder: Secondary | ICD-10-CM | POA: Diagnosis not present

## 2016-11-23 DIAGNOSIS — E7800 Pure hypercholesterolemia, unspecified: Secondary | ICD-10-CM

## 2016-11-23 NOTE — Addendum Note (Signed)
Addended by: Zannie Cove on: 11/23/2016 04:34 PM   Modules accepted: Orders

## 2016-11-23 NOTE — Patient Instructions (Addendum)
Medicare Annual Wellness Visit  [facility] and the medical providers at [facility] strive to bring you the best medical care.  In doing so we not only want to address your current medical conditions and concerns but also to detect new conditions early and prevent illness, disease and health-related problems.    Medicare offers a yearly Wellness Visit which allows our clinical staff to assess your need for preventative services including immunizations, lifestyle education, counseling to decrease risk of preventable diseases and screening for fall risk and other medical concerns.    This visit is provided free of charge (no copay) for all Medicare recipients. The clinical pharmacists at [facility] have begun to conduct these Wellness Visits which will also include a thorough review of all your medications.    As you primary medical provider recommend that you make an appointment for your Annual Wellness Visit if you have not done so already this year.  You may set up this appointment before you leave today or you may call back ([phone]) and schedule an appointment.  Please make sure when you call that you mention that you are scheduling your Annual Wellness Visit with the clinical pharmacist so that the appointment may be made for the proper length of time.     Continue current medications. Continue good therapeutic lifestyle changes which include good diet and exercise. Fall precautions discussed with patient. If an FOBT was given today- please return it to our front desk. If you are over 30 years old - you may need Prevnar 86 or the adult Pneumonia vaccine.  **Flu shots are available--- please call and schedule a FLU-CLINIC appointment**  After your visit with [place] today you will receive a survey in the mail or online from [employer] regarding your care with [place]. Please take a moment to fill this out. Your feedback is very  important to [place] as you can help [place] better understand your patient needs as well as improve your experience and satisfaction. WE CARE ABOUT YOU!!!   Drink fluids and stay well hydrated Continue to be careful and do not put yourself at risk for falling Over the next few weeks practice good respiratory precaution and hand hygiene Use a cool mist humidifier in your bedroom at nighttime

## 2016-11-23 NOTE — Progress Notes (Signed)
Subjective:    Patient ID: Kristi Sawyer, female    DOB: August 02, 1926, 83 y.o.   MRN: [id number]  HPI Pt here for follow up and management of chronic medical problems which includes hyperlipidemia and GERD. She is taking medication regularly.The patient is doing well today with no complaints. She denies any chest pain shortness of breath trouble swallowing heartburn indigestion nausea vomiting diarrhea blood in the stool or black tarry bowel movements. She is passing her water without problems. She denies any musculoskeletal complaints today. When the weather is warm she tries to still walk and stay active. She's been out eating with her church friends today and he enjoys attending church and the friends that she has there.     Patient Active Problem List   Diagnosis Date Noted  . Overactive bladder 06/18/2015  . Hyperlipidemia 09/26/2010  . ANXIETY 09/26/2010  . GERD (gastroesophageal reflux disease) 09/26/2010  . CELIAC SPRUE 09/26/2010  . Osteoporosis 09/26/2010   Outpatient Encounter Prescriptions as of 11/23/2016  Medication Sig  . aspirin (SB LOW DOSE ASA EC) 81 MG EC tablet Take 81 mg by mouth daily.    . Calcium Carbonate-Vit D-Min (CALTRATE PLUS PO) Take 1 tablet by mouth daily.   . Cholecalciferol (VITAMIN D) 2000 UNITS CAPS Take by mouth as directed. Take 1 tablet daily except none on saturdays and sundays  . Fe Fum-FePoly-Vit C-Vit B3 (INTEGRA) 62.5-62.5-40-3 MG CAPS Take 1 capsule by mouth daily.  Marland Kitchen FIBER PO Take 5 tablets by mouth daily as needed. Fiber choice  . metoCLOPramide (REGLAN) 10 MG tablet TAKE 1 TABLET BY MOUTH AT BEDTIME  . mirabegron ER (MYRBETRIQ) 50 MG TB24 tablet Take 1 tablet (50 mg total) by mouth daily.  . Multiple Vitamins-Minerals (CENTRUM SILVER PO) Take 1 tablet by mouth daily.   . Omega-3 Fatty Acids (FISH OIL) 1000 MG CAPS Take 3 capsules by mouth daily.   Marland Kitchen omeprazole (PRILOSEC) 20 MG capsule TAKE 1-2 CAPSULES (20-40 MG TOTAL) BY MOUTH DAILY.  Vladimir Faster Glycol-Propyl Glycol (SYSTANE OP) Apply to eye. 1- 2 drop   . rosuvastatin (CRESTOR) 5 MG tablet TAKE 1 TABLET ON MON, WED, FRIDAY  . Sennosides-Docusate Sodium (SENNA PLUS PO) Take by mouth as directed.   No facility-administered encounter medications on file as of 11/23/2016.       Review of Systems  Constitutional: Negative.   HENT: Negative.   Eyes: Negative.   Respiratory: Negative.   Cardiovascular: Negative.   Gastrointestinal: Negative.   Endocrine: Negative.   Genitourinary: Negative.   Musculoskeletal: Negative.   Skin: Negative.   Allergic/Immunologic: Negative.   Neurological: Negative.   Hematological: Negative.   Psychiatric/Behavioral: Negative.        Objective:   Physical Exam  Constitutional: She is oriented to person, place, and time. She appears well-developed and well-nourished. No distress.  HENT:  Head: Normocephalic and atraumatic.  Right Ear: External ear normal.  Left Ear: External ear normal.  Nose: Nose normal.  Mouth/Throat: No oropharyngeal exudate.  Eyes: Conjunctivae and EOM are normal. Pupils are equal, round, and reactive to light. Right eye exhibits no discharge. Left eye exhibits no discharge. No scleral icterus.  Neck: Normal range of motion. Neck supple. No thyromegaly present.  Cardiovascular: Normal rate, regular rhythm, normal heart sounds and intact distal pulses.   No murmur heard. Pulmonary/Chest: Effort normal and breath sounds normal. No respiratory distress. She has no wheezes. She has no rales.  Abdominal: Soft. Bowel sounds are normal. She  exhibits no mass. There is no tenderness. There is no rebound and no guarding.  Musculoskeletal: Normal range of motion. She exhibits no edema.  Lymphadenopathy:    She has no cervical adenopathy.  Neurological: She is alert and oriented to person, place, and time. No cranial nerve deficit.  Skin: Skin is warm and dry. No rash noted.  Psychiatric: She has a normal mood and  affect. Her behavior is normal. Judgment and thought content normal.  Nursing note and vitals reviewed.   BP 139/66 (BP Location: Left Arm)   Pulse 75   Temp 98.1 F (36.7 C) (Oral)   Ht 5' 1"  (1.549 m)   Wt 100 lb (45.4 kg)   BMI 18.89 kg/m         Assessment & Plan:  1. Vitamin D deficiency -Continue current treatment pending results of lab work  2. Pure hypercholesterolemia -Continue Crestor and aggressive therapeutic lifestyle changes pending results of lab work  3. Gastroesophageal reflux disease, esophagitis presence not specified -The patient is having no symptoms with her reflux disease currently.  4. Lumbar spondylosis with myelopathy -She is doing better with her back pain and inflammation and there was no obvious physical findings were that today in observing the patient.  5. Overactive bladder -This continues to be a problem and she is requesting some samples of Myrbetriq.  Patient Instructions                       Medicare Annual Wellness Visit  [facility] and the medical providers at [facility] strive to bring you the best medical care.  In doing so we not only want to address your current medical conditions and concerns but also to detect new conditions early and prevent illness, disease and health-related problems.    Medicare offers a yearly Wellness Visit which allows our clinical staff to assess your need for preventative services including immunizations, lifestyle education, counseling to decrease risk of preventable diseases and screening for fall risk and other medical concerns.    This visit is provided free of charge (no copay) for all Medicare recipients. The clinical pharmacists at [facility] have begun to conduct these Wellness Visits which will also include a thorough review of all your medications.    As you primary medical provider recommend that you make an appointment for your Annual Wellness  Visit if you have not done so already this year.  You may set up this appointment before you leave today or you may call back ([phone]) and schedule an appointment.  Please make sure when you call that you mention that you are scheduling your Annual Wellness Visit with the clinical pharmacist so that the appointment may be made for the proper length of time.     Continue current medications. Continue good therapeutic lifestyle changes which include good diet and exercise. Fall precautions discussed with patient. If an FOBT was given today- please return it to our front desk. If you are over 66 years old - you may need Prevnar 22 or the adult Pneumonia vaccine.  **Flu shots are available--- please call and schedule a FLU-CLINIC appointment**  After your visit with [place] today you will receive a survey in the mail or online from [employer] regarding your care with [place]. Please take a moment to fill this out. Your feedback is very important to [place] as you can help [place] better understand your patient needs as well as improve your  experience and satisfaction. WE CARE ABOUT YOU!!!   Drink fluids and stay well hydrated Continue to be careful and do not put yourself at risk for falling Over the next few weeks practice good respiratory precaution and hand hygiene Use a cool mist humidifier in your bedroom at nighttime  Arrie Senate MD

## 2016-11-24 LAB — BMP8+EGFR
BUN/Creatinine Ratio: 15 (ref 12–28)
BUN: 9 mg/dL (ref 8–27)
CALCIUM: 9.3 mg/dL (ref 8.7–10.3)
CHLORIDE: 99 mmol/L (ref 96–106)
CO2: 25 mmol/L (ref 18–29)
Creatinine, Ser: 0.59 mg/dL (ref 0.57–1.00)
GFR calc non Af Amer: 82 mL/min/{1.73_m2} (ref >59)
GFR, EST AFRICAN AMERICAN: 95 mL/min/{1.73_m2} (ref >59)
GLUCOSE: 104 mg/dL — AB (ref 65–99)
Potassium: 4.5 mmol/L (ref 3.5–5.2)
Sodium: 138 mmol/L (ref 134–144)

## 2016-11-24 LAB — CBC WITH DIFFERENTIAL/PLATELET
BASOS ABS: 0 10*3/uL (ref 0.0–0.2)
Basos: 0 %
EOS (ABSOLUTE): 0.1 10*3/uL (ref 0.0–0.4)
Eos: 1 %
HEMOGLOBIN: 13.5 g/dL (ref 11.1–15.9)
Hematocrit: 40.8 % (ref 34.0–46.6)
IMMATURE GRANS (ABS): 0 10*3/uL (ref 0.0–0.1)
Immature Granulocytes: 0 %
LYMPHS: 35 %
Lymphocytes Absolute: 2.5 10*3/uL (ref 0.7–3.1)
MCH: 30.5 pg (ref 26.6–33.0)
MCHC: 33.1 g/dL (ref 31.5–35.7)
MCV: 92 fL (ref 79–97)
Monocytes Absolute: 0.7 10*3/uL (ref 0.1–0.9)
Monocytes: 10 %
Neutrophils Absolute: 3.9 10*3/uL (ref 1.4–7.0)
Neutrophils: 54 %
Platelets: 231 10*3/uL (ref 150–379)
RBC: 4.42 x10E6/uL (ref 3.77–5.28)
RDW: 13.5 % (ref 12.3–15.4)
WBC: 7.2 10*3/uL (ref 3.4–10.8)

## 2016-11-24 LAB — LIPID PANEL
Chol/HDL Ratio: 2.3 ratio units (ref 0.0–4.4)
Cholesterol, Total: 157 mg/dL (ref 100–199)
HDL: 67 mg/dL (ref >39)
LDL CALC: 78 mg/dL (ref 0–99)
Triglycerides: 62 mg/dL (ref 0–149)
VLDL CHOLESTEROL CAL: 12 mg/dL (ref 5–40)

## 2016-11-24 LAB — HEPATIC FUNCTION PANEL
ALBUMIN: 4.3 g/dL (ref 3.5–4.7)
ALK PHOS: 44 IU/L (ref 39–117)
ALT: 20 IU/L (ref 0–32)
AST: 25 IU/L (ref 0–40)
Bilirubin Total: 0.3 mg/dL (ref 0.0–1.2)
Bilirubin, Direct: 0.08 mg/dL (ref 0.00–0.40)
TOTAL PROTEIN: 6.4 g/dL (ref 6.0–8.5)

## 2016-11-24 LAB — VITAMIN D 25 HYDROXY (VIT D DEFICIENCY, FRACTURES): Vit D, 25-Hydroxy: 69.5 ng/mL (ref 30.0–100.0)

## 2016-12-12 ENCOUNTER — Other Ambulatory Visit: Payer: Self-pay | Admitting: *Deleted

## 2016-12-12 MED ORDER — OMEPRAZOLE 20 MG PO CPDR: 20.0000 mg | DELAYED_RELEASE_CAPSULE | Freq: Every day | ORAL | 3 refills | Status: DC

## 2016-12-13 DIAGNOSIS — N3946 Mixed incontinence: Secondary | ICD-10-CM | POA: Diagnosis not present

## 2016-12-13 DIAGNOSIS — R3121 Asymptomatic microscopic hematuria: Secondary | ICD-10-CM | POA: Diagnosis not present

## 2016-12-13 DIAGNOSIS — R351 Nocturia: Secondary | ICD-10-CM | POA: Diagnosis not present

## 2017-01-01 ENCOUNTER — Other Ambulatory Visit: Payer: Self-pay | Admitting: Family Medicine

## 2017-03-02 ENCOUNTER — Encounter (HOSPITAL_COMMUNITY): Payer: Medicare Other

## 2017-03-02 ENCOUNTER — Other Ambulatory Visit (HOSPITAL_COMMUNITY): Payer: Medicare Other

## 2017-03-18 ENCOUNTER — Other Ambulatory Visit: Payer: Self-pay | Admitting: Internal Medicine

## 2017-03-19 NOTE — Telephone Encounter (Signed)
Please advise Sir, thank you. 

## 2017-03-19 NOTE — Telephone Encounter (Signed)
Refill x 1 year

## 2017-03-28 ENCOUNTER — Ambulatory Visit (INDEPENDENT_AMBULATORY_CARE_PROVIDER_SITE_OTHER): Payer: Medicare Other | Admitting: Family Medicine

## 2017-03-28 ENCOUNTER — Encounter: Payer: Self-pay | Admitting: Family Medicine

## 2017-03-28 VITALS — BP 140/83 | HR 76 | Temp 98.1°F | Ht 61.0 in | Wt 99.0 lb

## 2017-03-28 DIAGNOSIS — M810 Age-related osteoporosis without current pathological fracture: Secondary | ICD-10-CM | POA: Diagnosis not present

## 2017-03-28 DIAGNOSIS — K219 Gastro-esophageal reflux disease without esophagitis: Secondary | ICD-10-CM

## 2017-03-28 DIAGNOSIS — E7800 Pure hypercholesterolemia, unspecified: Secondary | ICD-10-CM

## 2017-03-28 DIAGNOSIS — E559 Vitamin D deficiency, unspecified: Secondary | ICD-10-CM

## 2017-03-28 NOTE — Progress Notes (Signed)
Subjective:    Patient ID: Kristi Sawyer, female    DOB: 1926-10-05, 83 y.o.   MRN: [id number]  HPI Pt here for follow up and management of chronic medical problems which includes hyperlipidemia. She is taking medication regularly.The patient is doing well overall. She has no specific complaints and is not needing any refills. She will get her lab work done today and is due to get a mammogram soon. I'll encourage her to keep getting her mammograms. The patient is doing well and is very resilient and looks much younger than her stated age of 83 years. She walks regularly and is in good spirits. She stays active and wears her Lifeline regularly. She denies any chest pain or shortness of breath. She denies any trouble with swallowing including heartburn indigestion nausea vomiting diarrhea or blood in the stool. She is passing her water without problems. The problem the patient was having with her right hip discomfort has cleared through physical therapy and it was thought maybe she may have had a ham strings strain. She absolutely has no other complaints today.    Patient Active Problem List   Diagnosis Date Noted  . Overactive bladder 06/18/2015  . Hyperlipidemia 09/26/2010  . ANXIETY 09/26/2010  . GERD (gastroesophageal reflux disease) 09/26/2010  . CELIAC SPRUE 09/26/2010  . Osteoporosis 09/26/2010   Outpatient Encounter Prescriptions as of 03/28/2017  Medication Sig  . aspirin (SB LOW DOSE ASA EC) 81 MG EC tablet Take 81 mg by mouth daily.    . Calcium Carbonate-Vit D-Min (CALTRATE PLUS PO) Take 1 tablet by mouth daily.   . Cholecalciferol (VITAMIN D) 2000 UNITS CAPS Take by mouth as directed. Take 1 tablet daily except none on saturdays and sundays  . Fe Fum-FePoly-Vit C-Vit B3 (INTEGRA) 62.5-62.5-40-3 MG CAPS TAKE 1 CAPSULE BY MOUTH DAILY.  Marland Kitchen FIBER PO Take 5 tablets by mouth daily as needed. Fiber choice  . metoCLOPramide (REGLAN) 10 MG tablet TAKE 1 TABLET BY MOUTH AT BEDTIME  .  mirabegron ER (MYRBETRIQ) 50 MG TB24 tablet Take 1 tablet (50 mg total) by mouth daily.  . Multiple Vitamins-Minerals (CENTRUM SILVER PO) Take 1 tablet by mouth daily.   . Omega-3 Fatty Acids (FISH OIL) 1000 MG CAPS Take 3 capsules by mouth daily.   Marland Kitchen omeprazole (PRILOSEC) 20 MG capsule Take 1-2 capsules (20-40 mg total) by mouth daily.  Vladimir Faster Glycol-Propyl Glycol (SYSTANE OP) Apply to eye. 1- 2 drop   . rosuvastatin (CRESTOR) 5 MG tablet TAKE 1 TABLET ON MON, WED, FRIDAY  . Sennosides-Docusate Sodium (SENNA PLUS PO) Take by mouth as directed.   No facility-administered encounter medications on file as of 03/28/2017.       Review of Systems  Constitutional: Negative.   HENT: Negative.   Eyes: Negative.   Respiratory: Negative.   Cardiovascular: Negative.   Gastrointestinal: Negative.   Endocrine: Negative.   Genitourinary: Negative.   Musculoskeletal: Negative.   Skin: Negative.   Allergic/Immunologic: Negative.   Neurological: Negative.   Hematological: Negative.   Psychiatric/Behavioral: Negative.        Objective:   Physical Exam  Constitutional: She is oriented to person, place, and time. She appears well-developed and well-nourished.  The patient is pleasant and alert and as mentioned looks much younger than her stated age  HENT:  Head: Normocephalic and atraumatic.  Right Ear: External ear normal.  Left Ear: External ear normal.  Nose: Nose normal.  Mouth/Throat: Oropharynx is clear and moist. No oropharyngeal exudate.  Eyes: Conjunctivae and EOM are normal. Pupils are equal, round, and reactive to light. Right eye exhibits no discharge. Left eye exhibits no discharge. No scleral icterus.  Neck: Normal range of motion. Neck supple. No thyromegaly present.  No bruits thyromegaly or anterior cervical adenopathy  Cardiovascular: Normal rate, regular rhythm, normal heart sounds and intact distal pulses.   No murmur heard. The heart is regular at 60/m    Pulmonary/Chest: Effort normal and breath sounds normal. No respiratory distress. She has no wheezes. She has no rales. She exhibits no tenderness.  Clear anteriorly and posteriorly  Abdominal: Soft. Bowel sounds are normal. She exhibits no mass. There is no tenderness. There is no rebound and no guarding.  No abdominal tenderness masses or organ enlargement or bruits  Musculoskeletal: Normal range of motion. She exhibits no edema.  Lymphadenopathy:    She has no cervical adenopathy.  Neurological: She is alert and oriented to person, place, and time. She has normal reflexes. No cranial nerve deficit.  Skin: Skin is warm and dry. No rash noted.  Psychiatric: She has a normal mood and affect. Her behavior is normal. Judgment and thought content normal.  Nursing note and vitals reviewed.   BP (!) 147/78 (BP Location: Left Arm)   Pulse 79   Temp 98.1 F (36.7 C) (Oral)   Ht 5' 1"  (1.549 m)   Wt 99 lb (44.9 kg)   BMI 18.71 kg/m        Assessment & Plan:  1. Gastroesophageal reflux disease, esophagitis presence not specified -The patient has no complaints with this today. She will continue with her omeprazole. She will watch her diet closely. - CBC with Differential/Platelet - Hepatic function panel  2. Pure hypercholesterolemia -She will continue with the current dose of Crestor pending results of lab work - BMP8+EGFR - CBC with Differential/Platelet - Lipid panel  3. Vitamin D deficiency -Continue with vitamin D replacement pending results of lab work - CBC with Differential/Platelet - VITAMIN D 25 Hydroxy (Vit-D Deficiency, Fractures)  4. Age-related osteoporosis without current pathological fracture -Continue weightbearing exercise avoid climbing and make every effort to move slowly to keep from falling.  Patient Instructions                       Medicare Annual Wellness Visit  [facility] and the medical providers at [facility] strive to  bring you the best medical care.  In doing so we not only want to address your current medical conditions and concerns but also to detect new conditions early and prevent illness, disease and health-related problems.    Medicare offers a yearly Wellness Visit which allows our clinical staff to assess your need for preventative services including immunizations, lifestyle education, counseling to decrease risk of preventable diseases and screening for fall risk and other medical concerns.    This visit is provided free of charge (no copay) for all Medicare recipients. The clinical pharmacists at [facility] have begun to conduct these Wellness Visits which will also include a thorough review of all your medications.    As you primary medical provider recommend that you make an appointment for your Annual Wellness Visit if you have not done so already this year.  You may set up this appointment before you leave today or you may call back ([phone]) and schedule an appointment.  Please make sure when you call that you mention that you are scheduling your Annual Wellness  Visit with the clinical pharmacist so that the appointment may be made for the proper length of time.    Continue current medications. Continue good therapeutic lifestyle changes which include good diet and exercise. Fall precautions discussed with patient. If an FOBT was given today- please return it to our front desk. If you are over 63 years old - you may need Prevnar 61 or the adult Pneumonia vaccine.  **Flu shots are available--- please call and schedule a FLU-CLINIC appointment**  After your visit with [place] today you will receive a survey in the mail or online from [employer] regarding your care with [place]. Please take a moment to fill this out. Your feedback is very important to [place] as you can help [place] better understand your patient needs as well as improve your experience and satisfaction. WE CARE ABOUT YOU!!!   Continue to walk regularly and stay active and don't climb Always be careful and did not put yourself at risk for falling We will call with lab work results as soon as those results become available   Arrie Senate MD

## 2017-03-28 NOTE — Patient Instructions (Addendum)
Medicare Annual Wellness Visit  [facility] and the medical providers at [facility] strive to bring you the best medical care.  In doing so we not only want to address your current medical conditions and concerns but also to detect new conditions early and prevent illness, disease and health-related problems.    Medicare offers a yearly Wellness Visit which allows our clinical staff to assess your need for preventative services including immunizations, lifestyle education, counseling to decrease risk of preventable diseases and screening for fall risk and other medical concerns.    This visit is provided free of charge (no copay) for all Medicare recipients. The clinical pharmacists at [facility] have begun to conduct these Wellness Visits which will also include a thorough review of all your medications.    As you primary medical provider recommend that you make an appointment for your Annual Wellness Visit if you have not done so already this year.  You may set up this appointment before you leave today or you may call back ([phone]) and schedule an appointment.  Please make sure when you call that you mention that you are scheduling your Annual Wellness Visit with the clinical pharmacist so that the appointment may be made for the proper length of time.    Continue current medications. Continue good therapeutic lifestyle changes which include good diet and exercise. Fall precautions discussed with patient. If an FOBT was given today- please return it to our front desk. If you are over 71 years old - you may need Prevnar 66 or the adult Pneumonia vaccine.  **Flu shots are available--- please call and schedule a FLU-CLINIC appointment**  After your visit with [place] today you will receive a survey in the mail or online from [employer] regarding your care with [place]. Please take a moment to fill this out. Your feedback is very  important to [place] as you can help [place] better understand your patient needs as well as improve your experience and satisfaction. WE CARE ABOUT YOU!!!  Continue to walk regularly and stay active and don't climb Always be careful and did not put yourself at risk for falling We will call with lab work results as soon as those results become available

## 2017-03-29 LAB — BMP8+EGFR
BUN/Creatinine Ratio: 12 (ref 12–28)
BUN: 8 mg/dL (ref 8–27)
CO2: 27 mmol/L (ref 18–29)
CREATININE: 0.65 mg/dL (ref 0.57–1.00)
Calcium: 9.9 mg/dL (ref 8.7–10.3)
Chloride: 97 mmol/L (ref 96–106)
GFR calc Af Amer: 92 mL/min/{1.73_m2} (ref >59)
GFR calc non Af Amer: 80 mL/min/{1.73_m2} (ref >59)
GLUCOSE: 98 mg/dL (ref 65–99)
POTASSIUM: 4.9 mmol/L (ref 3.5–5.2)
SODIUM: 140 mmol/L (ref 134–144)

## 2017-03-29 LAB — CBC WITH DIFFERENTIAL/PLATELET
BASOS ABS: 0 10*3/uL (ref 0.0–0.2)
Basos: 0 %
EOS (ABSOLUTE): 0.2 10*3/uL (ref 0.0–0.4)
Eos: 2 %
Hematocrit: 41.1 % (ref 34.0–46.6)
Hemoglobin: 13.3 g/dL (ref 11.1–15.9)
IMMATURE GRANS (ABS): 0 10*3/uL (ref 0.0–0.1)
Immature Granulocytes: 0 %
LYMPHS ABS: 3.1 10*3/uL (ref 0.7–3.1)
Lymphs: 31 %
MCH: 31.2 pg (ref 26.6–33.0)
MCHC: 32.4 g/dL (ref 31.5–35.7)
MCV: 97 fL (ref 79–97)
Monocytes Absolute: 1.1 10*3/uL — ABNORMAL HIGH (ref 0.1–0.9)
Monocytes: 11 %
NEUTROS PCT: 56 %
Neutrophils Absolute: 5.6 10*3/uL (ref 1.4–7.0)
PLATELETS: 370 10*3/uL (ref 150–379)
RBC: 4.26 x10E6/uL (ref 3.77–5.28)
RDW: 12.5 % (ref 12.3–15.4)
WBC: 10.1 10*3/uL (ref 3.4–10.8)

## 2017-03-29 LAB — LIPID PANEL
CHOLESTEROL TOTAL: 157 mg/dL (ref 100–199)
Chol/HDL Ratio: 2.7 ratio (ref 0.0–4.4)
HDL: 58 mg/dL (ref >39)
LDL Calculated: 81 mg/dL (ref 0–99)
Triglycerides: 90 mg/dL (ref 0–149)
VLDL Cholesterol Cal: 18 mg/dL (ref 5–40)

## 2017-03-29 LAB — VITAMIN D 25 HYDROXY (VIT D DEFICIENCY, FRACTURES): VIT D 25 HYDROXY: 63.9 ng/mL (ref 30.0–100.0)

## 2017-03-29 LAB — HEPATIC FUNCTION PANEL
ALT: 18 IU/L (ref 0–32)
AST: 22 IU/L (ref 0–40)
Albumin: 3.9 g/dL (ref 3.5–4.7)
Alkaline Phosphatase: 44 IU/L (ref 39–117)
Bilirubin Total: <0.2 mg/dL (ref 0.0–1.2)
Bilirubin, Direct: 0.06 mg/dL (ref 0.00–0.40)
TOTAL PROTEIN: 6.3 g/dL (ref 6.0–8.5)

## 2017-04-02 ENCOUNTER — Other Ambulatory Visit: Payer: Self-pay | Admitting: Family Medicine

## 2017-04-10 DIAGNOSIS — Z1231 Encounter for screening mammogram for malignant neoplasm of breast: Secondary | ICD-10-CM | POA: Diagnosis not present

## 2017-04-16 ENCOUNTER — Encounter: Payer: Self-pay | Admitting: Pharmacist

## 2017-04-16 ENCOUNTER — Ambulatory Visit (INDEPENDENT_AMBULATORY_CARE_PROVIDER_SITE_OTHER): Payer: Medicare Other | Admitting: Pharmacist

## 2017-04-16 VITALS — BP 132/82 | HR 75 | Ht 61.0 in | Wt 101.5 lb

## 2017-04-16 DIAGNOSIS — Z0000 Encounter for general adult medical examination without abnormal findings: Secondary | ICD-10-CM | POA: Diagnosis not present

## 2017-04-16 DIAGNOSIS — M810 Age-related osteoporosis without current pathological fracture: Secondary | ICD-10-CM

## 2017-04-16 NOTE — Patient Instructions (Addendum)
Kristi Sawyer , Thank you for taking time to come for your Medicare Wellness Visit. I appreciate your ongoing commitment to your health goals. Please review the following plan we discussed and let me know if I can assist you in the future.   These are the goals we discussed: Continue to drink Ensure every other day.  Continue to exercise daily - walking is great   This is a list of the screening recommended for you and due dates:  Health Maintenance  Topic Date Due  . Mammogram  Done last week  . Pneumonia vaccines (2 of 2 - PPSV23) Completed  . Flu Shot  06/13/2017  . DEXA scan (bone density measurement)  10/17/2017  . Tetanus Vaccine  Does not get due to allergy  *Topic was postponed. The date shown is not the original due date.   Fall Prevention in the Home Falls can cause injuries and can affect people from all age groups. There are many simple things that you can do to make your home safe and to help prevent falls. What can I do on the outside of my home?  Regularly repair the edges of walkways and driveways and fix any cracks.  Remove high doorway thresholds.  Trim any shrubbery on the main path into your home.  Use bright outdoor lighting.  Clear walkways of debris and clutter, including tools and rocks.  Regularly check that handrails are securely fastened and in good repair. Both sides of any steps should have handrails.  Install guardrails along the edges of any raised decks or porches.  Have leaves, snow, and ice cleared regularly.  Use sand or salt on walkways during winter months.  In the garage, clean up any spills right away, including grease or oil spills. What can I do in the bathroom?  Use night lights.  Install grab bars by the toilet and in the tub and shower. Do not use towel bars as grab bars.  Use non-skid mats or decals on the floor of the tub or shower.  If you need to sit down while you are in the shower, use a plastic, non-slip stool.  Keep the  floor dry. Immediately clean up any water that spills on the floor.  Remove soap buildup in the tub or shower on a regular basis.  Attach bath mats securely with double-sided non-slip rug tape.  Remove throw rugs and other tripping hazards from the floor. What can I do in the bedroom?  Use night lights.  Make sure that a bedside light is easy to reach.  Do not use oversized bedding that drapes onto the floor.  Have a firm chair that has side arms to use for getting dressed.  Remove throw rugs and other tripping hazards from the floor. What can I do in the kitchen?  Clean up any spills right away.  Avoid walking on wet floors.  Place frequently used items in easy-to-reach places.  If you need to reach for something above you, use a sturdy step stool that has a grab bar.  Keep electrical cables out of the way.  Do not use floor polish or wax that makes floors slippery. If you have to use wax, make sure that it is non-skid floor wax.  Remove throw rugs and other tripping hazards from the floor. What can I do in the stairways?  Do not leave any items on the stairs.  Make sure that there are handrails on both sides of the stairs. Fix handrails that  are broken or loose. Make sure that handrails are as long as the stairways.  Check any carpeting to make sure that it is firmly attached to the stairs. Fix any carpet that is loose or worn.  Avoid having throw rugs at the top or bottom of stairways, or secure the rugs with carpet tape to prevent them from moving.  Make sure that you have a light switch at the top of the stairs and the bottom of the stairs. If you do not have them, have them installed. What are some other fall prevention tips?  Wear closed-toe shoes that fit well and support your feet. Wear shoes that have rubber soles or low heels.  When you use a stepladder, make sure that it is completely opened and that the sides are firmly locked. Have someone hold the ladder  while you are using it. Do not climb a closed stepladder.  Add color or contrast paint or tape to grab bars and handrails in your home. Place contrasting color strips on the first and last steps.  Use mobility aids as needed, such as canes, walkers, scooters, and crutches.  Turn on lights if it is dark. Replace any light bulbs that burn out.  Set up furniture so that there are clear paths. Keep the furniture in the same spot.  Fix any uneven floor surfaces.  Choose a carpet design that does not hide the edge of steps of a stairway.  Be aware of any and all pets.  Review your medicines with your healthcare provider. Some medicines can cause dizziness or changes in blood pressure, which increase your risk of falling. Talk with your health care provider about other ways that you can decrease your risk of falls. This may include working with a physical therapist or trainer to improve your strength, balance, and endurance. This information is not intended to replace advice given to you by your health care provider. Make sure you discuss any questions you have with your health care provider. Document Released: 10/20/2002 Document Revised: 03/28/2016 Document Reviewed: 12/04/2014 Elsevier Interactive Patient Education  2017 [employer].

## 2017-04-16 NOTE — Progress Notes (Signed)
Subjective:   Kristi Sawyer is a 83 y.o. female who presents for a subsequent Medicare Annual Wellness Visit.  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 Alcohol/Tobacco/Substances: None She is active in her church - write notes, visits and calls to check on members regularly.    Kristi Sawyer reports that she feel her health has not changed over the last 12 months and she feels she is in good health.  Concerned at last years AWV that weight had decreased significantly.  This year weight has been stable.  Current Medications (verified) Outpatient Encounter Prescriptions as of 04/16/2017  Medication Sig  . aspirin (SB LOW DOSE ASA EC) 81 MG EC tablet Take 81 mg by mouth daily.    . Calcium Carbonate-Vit D-Min (CALTRATE PLUS PO) Take 1 tablet by mouth daily.   . Cholecalciferol (VITAMIN D) 2000 UNITS CAPS Take by mouth as directed. Take 1 tablet daily except none on saturdays and sundays  . Fe Fum-FePoly-Vit C-Vit B3 (INTEGRA) 62.5-62.5-40-3 MG CAPS TAKE 1 CAPSULE BY MOUTH DAILY.  Marland Kitchen FIBER PO Take 5 tablets by mouth daily as needed. Fiber choice  . metoCLOPramide (REGLAN) 10 MG tablet TAKE 1 TABLET BY MOUTH AT BEDTIME  . mirabegron ER (MYRBETRIQ) 50 MG TB24 tablet Take 1 tablet (50 mg total) by mouth daily.  . Multiple Vitamins-Minerals (CENTRUM SILVER PO) Take 1 tablet by mouth daily.   . Omega-3 Fatty Acids (FISH OIL) 1000 MG CAPS Take 3 capsules by mouth daily.   Marland Kitchen omeprazole (PRILOSEC) 20 MG capsule Take 1-2 capsules (20-40 mg total) by mouth daily.  Kristi Sawyer (SYSTANE OP) Apply to eye. 1- 2 drop   . rosuvastatin (CRESTOR) 5 MG tablet TAKE 1 TABLET ON MON, WED, FRIDAY  . Sennosides-Docusate Sodium (SENNA PLUS PO) Take by mouth as directed.   No facility-administered encounter medications on file as of 04/16/2017.      Allergies (verified) Prevnar 13 [pneumococcal 13-val conj vacc]; Penicillins; Tetanus toxoids; Codeine; and Sulfonamide derivatives   History: Past Medical History:  Diagnosis Date  . Acid reflux   . Anemia   . Anxiety   . Cataracts, bilateral   . Celiac disease/sprue   . Chronic gastritis   . Esophageal stricture   . Euthyroid   . Gastritis   . Hiatal hernia   . Hyperlipidemia   . Hypermobility syndrome   . 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 Neuropathy   . Osteoporosis   . Palpitations   . Postmenopausal HRT (hormone replacement therapy)   . Shingles   . Thyroid tumor, benign    Past Surgical History:  Procedure Laterality Date  . ABDOMINAL HYSTERECTOMY    . APPENDECTOMY  1935  . CATARACT EXTRACTION Bilateral   . COLONOSCOPY    . ESOPHAGOGASTRODUODENOSCOPY    . EYE SURGERY    . MYOMECTOMY    . PARTIAL HYSTERECTOMY  1974   w 1/2 right ovary   . RETINAL LASER PROCEDURE    . SIGMOIDOSCOPY    . THYROIDECTOMY  1975   rt. benign tumor    Family History  Problem Relation Age of Onset  . Healthy Mother   . Lung cancer Father    Social History   Occupational History  . retired Retired   Social History Main Topics  . Smoking status: Never Smoker  .  Smokeless tobacco: Never Used  . Alcohol use No  . Drug use: No  . Sexual activity: No    Do you feel safe at home?  Yes Are there smokers in your home (other than you)? No - in past exposed to second hand smoke when husband was alive Uses a life line in case of emergencies - has not had to use it yet.   Dietary issues and exercise activities: Current Exercise Habits: Home exercise routine, Type of exercise: walking, Time (Minutes): 25, Frequency (Times/Week): 6, Weekly Exercise (Minutes/Week): 150, Intensity: Moderate  Current Dietary habits:  Patient lives alone and prepares most meals at home.  Breakfast - cereal (gluten free) with fruit and whole milk Lunch - sandwich, potato thins Dinner  - fish / salmon, meat or beans, salad, vegetables Drink - water, sometimes tea  Drinks Ensure about every other day  Objective:    Today's Vitals   04/16/17 1430  BP: 132/82  Pulse: 75  Weight: 101 lb 8 oz (46 kg)  Height: 5' 1"  (1.549 m)  PainSc: 0-No pain   Body mass index is 19.18 kg/m.  Activities of Daily Living In your present state of health, do you have any difficulty performing the following activities: 04/16/2017  Hearing? N  Vision? N  Difficulty concentrating or making decisions? N  Walking or climbing stairs? N  Dressing or bathing? N  Doing errands, shopping? N  Preparing Food and eating ? N  Using the Toilet? N  In the past six months, have you accidently leaked urine? N  Do you have problems with loss of bowel control? N  Managing your Medications? N  Managing your Finances? N  Housekeeping or managing your Housekeeping? N  Some recent data might be hidden     Cardiac Risk Factors include: advanced age (>47mn, >>42women);dyslipidemia;family history of premature cardiovascular disease  Depression Screen PHQ 2/9 Scores 04/16/2017 03/28/2017 11/23/2016 08/22/2016  PHQ - 2 Score 0 0 0 0     Fall Risk Fall Risk  04/16/2017 03/28/2017 11/23/2016 08/22/2016 07/24/2016  Falls in the past year? No No No No No  Number falls in past yr: - - - - -  Injury with Fall? - - - - -  Risk for fall due to : - - - - -  Follow up - - - - -    Cognitive Function: MMSE - MZiebachExam 04/16/2017 04/14/2016 04/02/2015  Orientation to time 5 5 5   Orientation to Place 5 5 5   Registration - 3 3  Attention/ Calculation 3 5 4   Recall - 3 2  Language- name 2 objects 2 2 2   Language- repeat 1 1 1   Language- follow 3 step command 3 3 3   Language- read & follow direction 1 1 1   Write a sentence 1 1 1   Copy design 1 1 1   Total score - 30 28    Immunizations and Health Maintenance Immunization History  Administered Date(s) Administered  . Influenza,inj,Quad PF,36+ Mos  08/04/2013, 08/18/2014, 09/16/2015, 08/22/2016  . Pneumococcal Conjugate-13 11/05/2013  . Pneumococcal Polysaccharide-23 11/14/1995   Health Maintenance Due  Topic Date Due  . MAMMOGRAM  04/07/2017    Patient Care Team: MChipper Herb MD as PCP - General GCarlean PurlCOfilia Neas MD as Consulting Physician (Gastroenterology) RZadie RhineGClent Demark MD as Consulting Physician (Ophthalmology)  Indicate any recent Medical Services you may have received from other than [facility] providers in the past year (date may be approximate).  Assessment:    Annual Wellness Visit    Screening Tests Health Maintenance  Topic Date Due  . MAMMOGRAM  04/07/2017  . PNA vac Low Risk Adult (2 of 2 - PPSV23) 11/28/2017 (Originally 11/05/2014)  . INFLUENZA VACCINE  06/13/2017  . DEXA SCAN  10/17/2017  . TETANUS/TDAP  06/04/2023        Plan:   During the course of the visit Kristi Sawyer was educated and counseled about the following appropriate screening and preventive services:   Vaccines to include Pneumoccal, Influenza, Td and Shingles - given information about Shingrix vaccine - patient is concerned about high incidence of site redness and irritation reported with Shingrix due to past reaction to Tdap and Pneumovax 13  Colorectal cancer screening   Cardiovascular disease screening  Diabetes screening  Bone Denisty / Osteoporosis Screening- ordered for 10/2017  Mammogram - done at Dr Dion Body office 03/2017  Glaucoma screening / Eye Exam - UTD  Nutrition counseling - continue Ensure qd to qod - gave 1 case of samples  Advanced Directives - UTD  Physical Activity     Patient Instructions (the written plan) were given to the patient.   Cherre Robins, PharmD   04/16/2017

## 2017-06-05 ENCOUNTER — Encounter: Payer: Self-pay | Admitting: *Deleted

## 2017-06-09 ENCOUNTER — Ambulatory Visit (INDEPENDENT_AMBULATORY_CARE_PROVIDER_SITE_OTHER): Payer: Medicare Other | Admitting: Family Medicine

## 2017-06-09 VITALS — BP 132/76 | HR 66 | Temp 98.3°F | Ht 61.0 in | Wt 100.4 lb

## 2017-06-09 DIAGNOSIS — H00012 Hordeolum externum right lower eyelid: Secondary | ICD-10-CM | POA: Diagnosis not present

## 2017-06-09 MED ORDER — CEFPROZIL 500 MG PO TABS: 500.0000 mg | ORAL_TABLET | Freq: Two times a day (BID) | ORAL | 0 refills | Status: DC

## 2017-06-09 MED ORDER — TOBRAMYCIN-DEXAMETHASONE 0.3-0.1 % OP SUSP: OPHTHALMIC | 0 refills | Status: DC

## 2017-06-09 NOTE — Progress Notes (Signed)
Chief Complaint  Patient presents with  . Eye Problem    pt here today c/o right eye "sore" , itching and red and puffy.    HPI  Patient presents today for Redness and swelling under the right eye for 2 days. The conjunctiva is red and she's having a lot of itching. She's had no upper respiratory symptoms otherwise. No fever chills or sweats. Her vision is unchanged. She notes she had surgery for macular tear in that about a year and half ago. No change in vision however with this.  PMH: Smoking status noted ROS: Per HPI  Objective: BP 132/76   Pulse 66   Temp 98.3 F (36.8 C) (Oral)   Ht 5' 1"  (1.549 m)   Wt 100 lb 6 oz (45.5 kg)   BMI 18.97 kg/m  Gen: NAD, alert, cooperative with exam HEENT: NCAT, EOMI, PERRL, EOMI. The right lower lid is erythematous and edematous. There is an inferior tinnitus without fluctuance or head formation Ext: No edema, warm Neuro: Alert and oriented, No gross deficits  Assessment and plan:  1. Hordeolum externum of right lower eyelid     Meds ordered this encounter  Medications  . cefPROZIL (CEFZIL) 500 MG tablet    Sig: Take 1 tablet (500 mg total) by mouth 2 (two) times daily. For infection. Take all of this medication.    Dispense:  20 tablet    Refill:  0  . tobramycin-dexamethasone (TOBRADEX) ophthalmic solution    Sig: Apply 1 drop in affected eye 4 times a day    Dispense:  5 mL    Refill:  0    Follow up as needed.  Claretta Fraise, MD

## 2017-07-10 ENCOUNTER — Encounter: Payer: Self-pay | Admitting: *Deleted

## 2017-07-20 ENCOUNTER — Ambulatory Visit: Payer: Medicare Other | Admitting: Family Medicine

## 2017-07-20 ENCOUNTER — Other Ambulatory Visit: Payer: Self-pay | Admitting: Internal Medicine

## 2017-07-20 NOTE — Telephone Encounter (Signed)
Informed Kristi Sawyer to call in November for a December appointment and she said thank you for her reglan refill.

## 2017-07-20 NOTE — Telephone Encounter (Signed)
May I refill Sir, seen 10/2016.

## 2017-07-20 NOTE — Telephone Encounter (Signed)
Refill for 6 months  Tell her to call in Nov to get Dec appt

## 2017-07-23 ENCOUNTER — Ambulatory Visit: Payer: Medicare Other | Admitting: Family Medicine

## 2017-08-07 ENCOUNTER — Encounter: Payer: Self-pay | Admitting: Family Medicine

## 2017-08-07 ENCOUNTER — Ambulatory Visit (INDEPENDENT_AMBULATORY_CARE_PROVIDER_SITE_OTHER): Payer: Medicare Other | Admitting: Family Medicine

## 2017-08-07 VITALS — BP 138/74 | HR 80 | Temp 98.3°F | Ht 61.0 in | Wt 101.0 lb

## 2017-08-07 DIAGNOSIS — N3281 Overactive bladder: Secondary | ICD-10-CM | POA: Diagnosis not present

## 2017-08-07 DIAGNOSIS — K219 Gastro-esophageal reflux disease without esophagitis: Secondary | ICD-10-CM

## 2017-08-07 DIAGNOSIS — Z23 Encounter for immunization: Secondary | ICD-10-CM

## 2017-08-07 DIAGNOSIS — E7800 Pure hypercholesterolemia, unspecified: Secondary | ICD-10-CM | POA: Diagnosis not present

## 2017-08-07 DIAGNOSIS — M810 Age-related osteoporosis without current pathological fracture: Secondary | ICD-10-CM

## 2017-08-07 DIAGNOSIS — E559 Vitamin D deficiency, unspecified: Secondary | ICD-10-CM

## 2017-08-07 NOTE — Patient Instructions (Addendum)
Medicare Annual Wellness Visit  [facility] and the medical providers at [facility] strive to bring you the best medical care.  In doing so we not only want to address your current medical conditions and concerns but also to detect new conditions early and prevent illness, disease and health-related problems.    Medicare offers a yearly Wellness Visit which allows our clinical staff to assess your need for preventative services including immunizations, lifestyle education, counseling to decrease risk of preventable diseases and screening for fall risk and other medical concerns.    This visit is provided free of charge (no copay) for all Medicare recipients. The clinical pharmacists at [facility] have begun to conduct these Wellness Visits which will also include a thorough review of all your medications.    As you primary medical provider recommend that you make an appointment for your Annual Wellness Visit if you have not done so already this year.  You may set up this appointment before you leave today or you may call back ([phone]) and schedule an appointment.  Please make sure when you call that you mention that you are scheduling your Annual Wellness Visit with the clinical pharmacist so that the appointment may be made for the proper length of time.     Continue current medications. Continue good therapeutic lifestyle changes which include good diet and exercise. Fall precautions discussed with patient. If an FOBT was given today- please return it to our front desk. If you are over 98 years old - you may need Prevnar 44 or the adult Pneumonia vaccine.  **Flu shots are available--- please call and schedule a FLU-CLINIC appointment**  After your visit with [place] today you will receive a survey in the mail or online from [employer] regarding your care with [place]. Please take a moment to fill this out. Your feedback is very  important to [place] as you can help [place] better understand your patient needs as well as improve your experience and satisfaction. WE CARE ABOUT YOU!!!   Continue to walk and exercise and always be careful not to put yourself at risk for falling We will check on getting the Lifeline that coverage you anywhere that you are This winter drink plenty of fluids and stay well hydrated The flu shot that you're receiving today may make your arm sore Patient should get a GPS Lifeline they can track her anywhere since she lives by herself and she is doing a lot of walking.

## 2017-08-07 NOTE — Progress Notes (Signed)
Subjective:    Patient ID: Kristi Sawyer, female    DOB: 15-Dec-1925, 83 y.o.   MRN: [id number]  HPI Pt here for follow up and management of chronic medical problems which includes hyperlipidemia. She is taking medication regularly.The patient is doing well overall and has no specific complaints. She class to get her flu shot but would like to wait. She is given an FOBT to return. Her weight is stable and her vital signs are good. She continues to look much younger than her stated age. The patient is walking daily. She does wear a Lifeline. She denies any chest pain or shortness of breath. She denies any trouble with her intestinal tract including nausea vomiting diarrhea blood in the stool or black tarry bowel movements. She is passing her water without problems but does take myrbetrique . She did have a reaction to one of the vaccines a couple of years ago we think it may been to the Prevnar vaccine which has a tetanus component in it. She did take the flu shot last year did not have any problems with this.    Patient Active Problem List   Diagnosis Date Noted  . Overactive bladder 06/18/2015  . Hyperlipidemia 09/26/2010  . ANXIETY 09/26/2010  . GERD (gastroesophageal reflux disease) 09/26/2010  . CELIAC SPRUE 09/26/2010  . Osteoporosis 09/26/2010   Outpatient Encounter Prescriptions as of 08/07/2017  Medication Sig  . aspirin (SB LOW DOSE ASA EC) 81 MG EC tablet Take 81 mg by mouth daily.    . Calcium Carbonate-Vit D-Min (CALTRATE PLUS PO) Take 1 tablet by mouth daily.   . Cholecalciferol (VITAMIN D) 2000 UNITS CAPS Take by mouth as directed. Take 1 tablet daily except none on saturdays and sundays  . Fe Fum-FePoly-Vit C-Vit B3 (INTEGRA) 62.5-62.5-40-3 MG CAPS TAKE 1 CAPSULE BY MOUTH DAILY.  Marland Kitchen FIBER PO Take 5 tablets by mouth daily as needed. Fiber choice  . metoCLOPramide (REGLAN) 10 MG tablet TAKE 1 TABLET BY MOUTH AT BEDTIME  . mirabegron ER (MYRBETRIQ) 50 MG TB24 tablet Take 1 tablet  (50 mg total) by mouth daily.  . Multiple Vitamins-Minerals (CENTRUM SILVER PO) Take 1 tablet by mouth daily.   . Omega-3 Fatty Acids (FISH OIL) 1000 MG CAPS Take 3 capsules by mouth daily.   Marland Kitchen omeprazole (PRILOSEC) 20 MG capsule Take 1-2 capsules (20-40 mg total) by mouth daily.  Vladimir Faster Glycol-Propyl Glycol (SYSTANE OP) Apply to eye. 1- 2 drop   . rosuvastatin (CRESTOR) 5 MG tablet TAKE 1 TABLET ON MON, WED, FRIDAY  . Sennosides-Docusate Sodium (SENNA PLUS PO) Take by mouth as directed.  . tobramycin-dexamethasone (TOBRADEX) ophthalmic solution Apply 1 drop in affected eye 4 times a day  . [DISCONTINUED] cefPROZIL (CEFZIL) 500 MG tablet Take 1 tablet (500 mg total) by mouth 2 (two) times daily. For infection. Take all of this medication.   No facility-administered encounter medications on file as of 08/07/2017.       Review of Systems  Constitutional: Negative.   HENT: Negative.   Eyes: Negative.   Respiratory: Negative.   Cardiovascular: Negative.   Gastrointestinal: Negative.   Endocrine: Negative.   Genitourinary: Negative.   Musculoskeletal: Negative.   Skin: Negative.   Allergic/Immunologic: Negative.   Neurological: Negative.   Hematological: Negative.   Psychiatric/Behavioral: Negative.        Objective:   Physical Exam  Constitutional: She is oriented to person, place, and time. She appears well-developed and well-nourished. No distress.  This patient  continues to be pleasant and youthful-looking despite her age of 83 years. She is positive and alert.  HENT:  Head: Normocephalic and atraumatic.  Right Ear: External ear normal.  Left Ear: External ear normal.  Nose: Nose normal.  Mouth/Throat: Oropharynx is clear and moist.  Eyes: Pupils are equal, round, and reactive to light. Conjunctivae and EOM are normal. Right eye exhibits no discharge. Left eye exhibits no discharge. No scleral icterus.  She gets her eyes checked yearly.  Neck: Normal range of motion.  Neck supple. No thyromegaly present.  No bruits thyromegaly or anterior cervical adenopathy  Cardiovascular: Normal rate, regular rhythm, normal heart sounds and intact distal pulses.   No murmur heard. The heart rate was regular at 72/m with good pulses in both feet  Pulmonary/Chest: Effort normal and breath sounds normal. No respiratory distress. She has no wheezes. She has no rales.  Clear anteriorly and posteriorly  Abdominal: Soft. Bowel sounds are normal. She exhibits no mass. There is no tenderness. There is no rebound and no guarding.  No abdominal tenderness masses or organ enlargement or bruits  Musculoskeletal: Normal range of motion. She exhibits no edema.  Lymphadenopathy:    She has no cervical adenopathy.  Neurological: She is alert and oriented to person, place, and time. She has normal reflexes. No cranial nerve deficit.  Skin: Skin is warm and dry. No rash noted.  Psychiatric: She has a normal mood and affect. Her behavior is normal. Judgment and thought content normal.  Nursing note and vitals reviewed.   BP 138/74 (BP Location: Left Arm)   Pulse 80   Temp 98.3 F (36.8 C) (Oral)   Ht 5' 1"  (1.549 m)   Wt 101 lb (45.8 kg)   BMI 19.08 kg/m        Assessment & Plan:  1. Pure hypercholesterolemia -Continue current treatment and therapeutic lifestyle changes. We will call with results of lab work and she stay on the Crestor pending those results - CBC with Differential/Platelet - BMP8+EGFR - Lipid panel - Hepatic function panel  2. Vitamin D deficiency -Continue vitamin D replacement and weightbearing exercise - CBC with Differential/Platelet - VITAMIN D 25 Hydroxy (Vit-D Deficiency, Fractures)  3. Gastroesophageal reflux disease, esophagitis presence not specified -The patient has had trouble with reflux issues in the past but seems to be doing better with that recently. - CBC with Differential/Platelet - Hepatic function panel  4. Overactive  bladder -Continue with myrbetriq  5. Age-related osteoporosis without current pathological fracture -Continue weightbearing exercise and fall prevention plus calcium and vitamin D  Patient Instructions                       Medicare Annual Wellness Visit  [facility] and the medical providers at [facility] strive to bring you the best medical care.  In doing so we not only want to address your current medical conditions and concerns but also to detect new conditions early and prevent illness, disease and health-related problems.    Medicare offers a yearly Wellness Visit which allows our clinical staff to assess your need for preventative services including immunizations, lifestyle education, counseling to decrease risk of preventable diseases and screening for fall risk and other medical concerns.    This visit is provided free of charge (no copay) for all Medicare recipients. The clinical pharmacists at [facility] have begun to conduct these Wellness Visits which will also include a thorough review of  all your medications.    As you primary medical provider recommend that you make an appointment for your Annual Wellness Visit if you have not done so already this year.  You may set up this appointment before you leave today or you may call back ([phone]) and schedule an appointment.  Please make sure when you call that you mention that you are scheduling your Annual Wellness Visit with the clinical pharmacist so that the appointment may be made for the proper length of time.     Continue current medications. Continue good therapeutic lifestyle changes which include good diet and exercise. Fall precautions discussed with patient. If an FOBT was given today- please return it to our front desk. If you are over 91 years old - you may need Prevnar 5 or the adult Pneumonia vaccine.  **Flu shots are available--- please call and schedule a FLU-CLINIC  appointment**  After your visit with [place] today you will receive a survey in the mail or online from [employer] regarding your care with [place]. Please take a moment to fill this out. Your feedback is very important to [place] as you can help [place] better understand your patient needs as well as improve your experience and satisfaction. WE CARE ABOUT YOU!!!   Continue to walk and exercise and always be careful not to put yourself at risk for falling We will check on getting the Lifeline that coverage you anywhere that you are This winter drink plenty of fluids and stay well hydrated The flu shot that you're receiving today may make your arm sore Patient should get a GPS Lifeline they can track her anywhere since she lives by herself and she is doing a lot of walking.    Arrie Senate MD

## 2017-08-08 LAB — CBC WITH DIFFERENTIAL/PLATELET
Basophils Absolute: 0 10*3/uL (ref 0.0–0.2)
Basos: 0 %
EOS (ABSOLUTE): 0.1 10*3/uL (ref 0.0–0.4)
Eos: 1 %
Hematocrit: 42.2 % (ref 34.0–46.6)
Hemoglobin: 14.1 g/dL (ref 11.1–15.9)
Immature Grans (Abs): 0 10*3/uL (ref 0.0–0.1)
Immature Granulocytes: 0 %
Lymphocytes Absolute: 3.7 10*3/uL — ABNORMAL HIGH (ref 0.7–3.1)
Lymphs: 37 %
MCH: 30.9 pg (ref 26.6–33.0)
MCHC: 33.4 g/dL (ref 31.5–35.7)
MCV: 93 fL (ref 79–97)
Monocytes Absolute: 0.9 10*3/uL (ref 0.1–0.9)
Monocytes: 10 %
Neutrophils Absolute: 5.1 10*3/uL (ref 1.4–7.0)
Neutrophils: 52 %
Platelets: 230 10*3/uL (ref 150–379)
RBC: 4.56 x10E6/uL (ref 3.77–5.28)
RDW: 13.6 % (ref 12.3–15.4)
WBC: 9.8 10*3/uL (ref 3.4–10.8)

## 2017-08-08 LAB — BMP8+EGFR
BUN/Creatinine Ratio: 21 (ref 12–28)
BUN: 13 mg/dL (ref 8–27)
CO2: 26 mmol/L (ref 20–29)
Calcium: 9.7 mg/dL (ref 8.7–10.3)
Chloride: 98 mmol/L (ref 96–106)
Creatinine, Ser: 0.62 mg/dL (ref 0.57–1.00)
GFR calc Af Amer: 93 mL/min/{1.73_m2} (ref >59)
GFR calc non Af Amer: 81 mL/min/{1.73_m2} (ref >59)
Glucose: 93 mg/dL (ref 65–99)
Potassium: 4.7 mmol/L (ref 3.5–5.2)
Sodium: 140 mmol/L (ref 134–144)

## 2017-08-08 LAB — LIPID PANEL
CHOL/HDL RATIO: 2.3 ratio (ref 0.0–4.4)
Cholesterol, Total: 174 mg/dL (ref 100–199)
HDL: 76 mg/dL (ref >39)
LDL Calculated: 79 mg/dL (ref 0–99)
TRIGLYCERIDES: 96 mg/dL (ref 0–149)
VLDL Cholesterol Cal: 19 mg/dL (ref 5–40)

## 2017-08-08 LAB — HEPATIC FUNCTION PANEL
ALK PHOS: 48 IU/L (ref 39–117)
ALT: 17 IU/L (ref 0–32)
AST: 20 IU/L (ref 0–40)
Albumin: 4.1 g/dL (ref 3.5–4.7)
BILIRUBIN TOTAL: 0.3 mg/dL (ref 0.0–1.2)
BILIRUBIN, DIRECT: 0.08 mg/dL (ref 0.00–0.40)
TOTAL PROTEIN: 6.2 g/dL (ref 6.0–8.5)

## 2017-08-08 LAB — VITAMIN D 25 HYDROXY (VIT D DEFICIENCY, FRACTURES): Vit D, 25-Hydroxy: 48.5 ng/mL (ref 30.0–100.0)

## 2017-08-09 ENCOUNTER — Other Ambulatory Visit: Payer: Medicare Other

## 2017-08-09 DIAGNOSIS — Z1211 Encounter for screening for malignant neoplasm of colon: Secondary | ICD-10-CM

## 2017-08-12 LAB — FECAL OCCULT BLOOD, IMMUNOCHEMICAL: FECAL OCCULT BLD: NEGATIVE

## 2017-10-14 ENCOUNTER — Other Ambulatory Visit: Payer: Self-pay | Admitting: Family Medicine

## 2017-10-26 ENCOUNTER — Emergency Department (HOSPITAL_COMMUNITY): Payer: Medicare Other

## 2017-10-26 ENCOUNTER — Observation Stay (HOSPITAL_COMMUNITY)
Admission: EM | Admit: 2017-10-26 | Discharge: 2017-10-28 | Disposition: A | Discharge status: Home / Self Care | Payer: Medicare Other | Attending: Family Medicine | Admitting: Family Medicine

## 2017-10-26 ENCOUNTER — Encounter (HOSPITAL_COMMUNITY): Payer: Self-pay | Admitting: Emergency Medicine

## 2017-10-26 ENCOUNTER — Other Ambulatory Visit: Payer: Self-pay

## 2017-10-26 DIAGNOSIS — Z79899 Other long term (current) drug therapy: Secondary | ICD-10-CM | POA: Diagnosis not present

## 2017-10-26 DIAGNOSIS — R404 Transient alteration of awareness: Secondary | ICD-10-CM | POA: Diagnosis not present

## 2017-10-26 DIAGNOSIS — S0083XA Contusion of other part of head, initial encounter: Secondary | ICD-10-CM

## 2017-10-26 DIAGNOSIS — M6281 Muscle weakness (generalized): Secondary | ICD-10-CM | POA: Diagnosis not present

## 2017-10-26 DIAGNOSIS — R2689 Other abnormalities of gait and mobility: Secondary | ICD-10-CM | POA: Diagnosis not present

## 2017-10-26 DIAGNOSIS — S3992XA Unspecified injury of lower back, initial encounter: Secondary | ICD-10-CM | POA: Diagnosis not present

## 2017-10-26 DIAGNOSIS — S199XXA Unspecified injury of neck, initial encounter: Secondary | ICD-10-CM | POA: Diagnosis not present

## 2017-10-26 DIAGNOSIS — R531 Weakness: Secondary | ICD-10-CM | POA: Diagnosis not present

## 2017-10-26 DIAGNOSIS — R2681 Unsteadiness on feet: Secondary | ICD-10-CM | POA: Diagnosis not present

## 2017-10-26 DIAGNOSIS — R030 Elevated blood-pressure reading, without diagnosis of hypertension: Secondary | ICD-10-CM | POA: Diagnosis not present

## 2017-10-26 DIAGNOSIS — Z7982 Long term (current) use of aspirin: Secondary | ICD-10-CM | POA: Insufficient documentation

## 2017-10-26 DIAGNOSIS — E785 Hyperlipidemia, unspecified: Secondary | ICD-10-CM | POA: Insufficient documentation

## 2017-10-26 DIAGNOSIS — I10 Essential (primary) hypertension: Secondary | ICD-10-CM | POA: Diagnosis present

## 2017-10-26 DIAGNOSIS — K219 Gastro-esophageal reflux disease without esophagitis: Secondary | ICD-10-CM | POA: Diagnosis present

## 2017-10-26 DIAGNOSIS — S0990XA Unspecified injury of head, initial encounter: Secondary | ICD-10-CM | POA: Diagnosis not present

## 2017-10-26 DIAGNOSIS — J9811 Atelectasis: Secondary | ICD-10-CM | POA: Diagnosis not present

## 2017-10-26 LAB — DIFFERENTIAL
BASOS PCT: 0 %
Basophils Absolute: 0 10*3/uL (ref 0.0–0.1)
EOS ABS: 0.1 10*3/uL (ref 0.0–0.7)
EOS PCT: 1 %
Lymphocytes Relative: 9 %
Lymphs Abs: 1.2 10*3/uL (ref 0.7–4.0)
MONO ABS: 1 10*3/uL (ref 0.1–1.0)
Monocytes Relative: 8 %
NEUTROS ABS: 11 10*3/uL — AB (ref 1.7–7.7)
Neutrophils Relative %: 82 %

## 2017-10-26 LAB — URINALYSIS, ROUTINE W REFLEX MICROSCOPIC
BILIRUBIN URINE: NEGATIVE
GLUCOSE, UA: NEGATIVE mg/dL
HGB URINE DIPSTICK: NEGATIVE
KETONES UR: 5 mg/dL — AB
LEUKOCYTES UA: NEGATIVE
Nitrite: NEGATIVE
PROTEIN: 30 mg/dL — AB
Specific Gravity, Urine: 1.009 (ref 1.005–1.030)
pH: 7 (ref 5.0–8.0)

## 2017-10-26 LAB — BASIC METABOLIC PANEL
ANION GAP: 9 (ref 5–15)
BUN: 16 mg/dL (ref 6–20)
CALCIUM: 9.5 mg/dL (ref 8.9–10.3)
CHLORIDE: 96 mmol/L — AB (ref 101–111)
CO2: 26 mmol/L (ref 22–32)
Creatinine, Ser: 0.74 mg/dL (ref 0.44–1.00)
GFR calc non Af Amer: >60 mL/min (ref >60)
Glucose, Bld: 132 mg/dL — ABNORMAL HIGH (ref 65–99)
Potassium: 3.9 mmol/L (ref 3.5–5.1)
Sodium: 131 mmol/L — ABNORMAL LOW (ref 135–145)

## 2017-10-26 LAB — CBC
HCT: 38.9 % (ref 36.0–46.0)
HEMOGLOBIN: 12.8 g/dL (ref 12.0–15.0)
MCH: 30.4 pg (ref 26.0–34.0)
MCHC: 32.9 g/dL (ref 30.0–36.0)
MCV: 92.4 fL (ref 78.0–100.0)
Platelets: 283 10*3/uL (ref 150–400)
RBC: 4.21 MIL/uL (ref 3.87–5.11)
RDW: 12.9 % (ref 11.5–15.5)
WBC: 13.3 10*3/uL — ABNORMAL HIGH (ref 4.0–10.5)

## 2017-10-26 LAB — CK: CK TOTAL: 58 U/L (ref 38–234)

## 2017-10-26 LAB — LACTIC ACID, PLASMA: Lactic Acid, Venous: 1.1 mmol/L (ref 0.5–1.9)

## 2017-10-26 LAB — TROPONIN I: Troponin I: <0.03 ng/mL (ref <0.03)

## 2017-10-26 MED ORDER — SODIUM CHLORIDE 0.9 % IV SOLN: INTRAVENOUS | Status: DC

## 2017-10-26 NOTE — ED Notes (Signed)
Doctor wants to ambulate pt, pt not able to ambulate.

## 2017-10-26 NOTE — ED Provider Notes (Signed)
[facility] EMERGENCY DEPARTMENT Provider Note   CSN: [id number] Arrival date & time: 10/26/17  2015     History   Chief Complaint Chief Complaint  Patient presents with  . Weakness    HPI Kristi Sawyer is a 83 y.o. female.  HPI  Pt was seen at 2100. Per pt, c/o gradual onset and persistence of constant generalized weakness that began while she was taking a bath approximately 1800/1830 PTA. Pt states she could not stand up and get out of her bathtub. Pt lives alone and did not have her emergency alert chain on her. Pt states she laid in the tub for quite some time before she was able to crawl out of the tub, crawl down the hallway, and push her emergency alert system. Pt states she is unable to stand due to "feeling too weak." Denies any other complaints. Denies CP/palpitations, no SOB/cough, no abd pain, no N/V/D, no visual changes, no focal motor weakness, no tingling/numbness in extremities, no ataxia, no slurred speech, no facial droop.    Past Medical History:  Diagnosis Date  . Acid reflux   . Anemia   . Anxiety   . Cataracts, bilateral   . Celiac disease/sprue   . Chronic gastritis   . Esophageal stricture   . Euthyroid   . Gastritis   . Hiatal hernia   . Hyperlipidemia   . Hypermobility syndrome   . 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 Neuropathy   . Osteoporosis   . Palpitations   . Postmenopausal HRT (hormone replacement therapy)   . Shingles   . Thyroid tumor, benign     Patient Active Problem List   Diagnosis Date Noted  . Overactive bladder 06/18/2015  . Hyperlipidemia 09/26/2010  . ANXIETY 09/26/2010  . GERD (gastroesophageal reflux disease) 09/26/2010  . CELIAC SPRUE 09/26/2010  . Osteoporosis 09/26/2010    Past Surgical History:  Procedure Laterality Date  . ABDOMINAL HYSTERECTOMY    . APPENDECTOMY  1935  . CATARACT EXTRACTION Bilateral   . COLONOSCOPY    . ESOPHAGOGASTRODUODENOSCOPY    . EYE SURGERY    . MYOMECTOMY    .  PARTIAL HYSTERECTOMY  1974   w 1/2 right ovary   . RETINAL LASER PROCEDURE    . SIGMOIDOSCOPY    . THYROIDECTOMY  1975   rt. benign tumor     OB History    No data available       Home Medications    Prior to Admission medications   Medication Sig Start Date End Date Taking? Authorizing Provider  aspirin (SB LOW DOSE ASA EC) 81 MG EC tablet Take 81 mg by mouth daily.     Yes [provider]  Calcium Carbonate-Vit D-Min (CALTRATE PLUS PO) Take 1 tablet by mouth daily.    Yes [provider]  Cholecalciferol (VITAMIN D) 2000 UNITS CAPS Take by mouth as directed. Take 1 tablet daily except none on saturdays and sundays   Yes [provider]  Fe Fum-FePoly-Vit C-Vit B3 (INTEGRA) 62.5-62.5-40-3 MG CAPS TAKE 1 CAPSULE BY MOUTH DAILY. 03/19/17  Yes Gatha Mayer, MD  Inulin (FIBER CHOICE FRUITY BITES) 1.5 g CHEW Chew 5 tablets by mouth daily. FIBER CHOICE   Yes [provider]  metoCLOPramide (REGLAN) 10 MG tablet TAKE 1 TABLET BY MOUTH AT BEDTIME 07/20/17  Yes Gatha Mayer, MD  mirabegron ER (MYRBETRIQ) 50 MG TB24 tablet Take 1 tablet (50 mg total) by mouth daily.  09/22/16  Yes Chipper Herb, MD  Multiple Vitamins-Minerals (CENTRUM SILVER PO) Take 1 tablet by mouth daily.    Yes [provider]  Omega-3 Fatty Acids (FISH OIL) 1000 MG CAPS Take 1 capsule by mouth 3 (three) times daily.    Yes [provider]  omeprazole (PRILOSEC) 20 MG capsule Take 1-2 capsules (20-40 mg total) by mouth daily. Patient taking differently: Take 20-40 mg by mouth daily. 93m in the morning and 443mat bedtime 12/12/16  Yes MoChipper HerbMD  Polyethyl Glycol-Propyl Glycol (SYSTANE OP) Apply 1-2 drops to eye daily as needed (FOR DRY EYE).    Yes [provider]  rosuvastatin (CRESTOR) 5 MG tablet TAKE 1 TABLET ON MON, WED, FRIDAY 10/15/17  Yes MoChipper HerbMD  Sennosides-Docusate Sodium (SENNA PLUS PO) Take 1 tablet by mouth daily as needed  (FOR CONSTIPATION).     [provider]  tobramycin-dexamethasone (TBaird Cancerophthalmic solution Apply 1 drop in affected eye 4 times a day Patient not taking: Reported on 10/26/2017 06/09/17   StClaretta FraiseMD    Family History Family History  Problem Relation Age of Onset  . Healthy Mother   . Lung cancer Father     Social History Social History   Tobacco Use  . Smoking status: Never Smoker  . Smokeless tobacco: Never Used  Substance Use Topics  . Alcohol use: No  . Drug use: No     Allergies   Prevnar 13 [pneumococcal 13-val conj vacc]; Penicillins; Tetanus toxoids; Codeine; and Sulfonamide derivatives   Review of Systems Review of Systems ROS: Statement: All systems negative except as marked or noted in the HPI; Constitutional: Negative for fever and chills. ; ; Eyes: Negative for eye pain, redness and discharge. ; ; ENMT: Negative for ear pain, hoarseness, nasal congestion, sinus pressure and sore throat. ; ; Cardiovascular: Negative for chest pain, palpitations, diaphoresis, dyspnea and peripheral edema. ; ; Respiratory: Negative for cough, wheezing and stridor. ; ; Gastrointestinal: Negative for nausea, vomiting, diarrhea, abdominal pain, blood in stool, hematemesis, jaundice and rectal bleeding. . ; ; Genitourinary: Negative for dysuria, flank pain and hematuria. ; ; Musculoskeletal: Negative for back pain and neck pain. Negative for swelling and trauma.; ; Skin: Negative for pruritus, rash, abrasions, blisters, bruising and skin lesion.; ; Neuro: +generalized weakness. Negative for headache, lightheadedness and neck stiffness. Negative for altered level of consciousness, altered mental status, extremity weakness, paresthesias, involuntary movement, seizure and syncope.       Physical Exam Updated Vital Signs BP (!) 172/95   Pulse 90   Temp 98 F (36.7 C) (Oral)   Resp (!) 21   Ht 5' (1.524 m)   Wt 45.4 kg (100 lb)   SpO2 97%   BMI 19.53 kg/m    22:19  Orthostatic Vital Signs CH  Orthostatic Lying   BP- Lying: 172/85  Pulse- Lying: 111      Orthostatic Sitting  BP- Sitting:  152/112  Pulse- Sitting: 120      Orthostatic Standing at 0 minutes  BP- Standing at 0 minutes:  113/101  Pulse- Standing at 0 minutes: 107     Physical Exam 2110: Physical examination:  Nursing notes reviewed; Vital signs and O2 SAT reviewed;  Constitutional: Well developed, Well nourished, Well hydrated, In no acute distress; Head:  Normocephalic, +fading ecchymosis to forehead and around right eye.; Eyes: EOMI, PERRL, No scleral icterus; ENMT: Mouth and pharynx normal, Mucous membranes moist; Neck: Supple, Full range of  motion, No lymphadenopathy; Cardiovascular: Regular rate and rhythm, No gallop; Respiratory: Breath sounds clear & equal bilaterally, No wheezes.  Speaking full sentences with ease, Normal respiratory effort/excursion; Chest: Nontender, Movement normal; Abdomen: Soft, Nontender, Nondistended, Normal bowel sounds; Genitourinary: No CVA tenderness; Spine:  No midline CS, TS, LS tenderness.;; Extremities: Pulses normal, No tenderness, No edema, No calf edema or asymmetry.; Neuro: AA&Ox3, Major CN grossly intact. No facial droop.  Speech clear. Grips equal. Strength 4/5 bilat LE's, 5/5 bilat UE's. No gross focal motor or sensory deficits in extremities.; Skin: Color normal, Warm, Dry.   ED Treatments / Results  Labs (all labs ordered are listed, but only abnormal results are displayed)   EKG  EKG Interpretation  Date/Time:  Friday October 26 2017 20:21:31 EST Ventricular Rate:  90 PR Interval:    QRS Duration: 91 QT Interval:  348 QTC Calculation: 426 R Axis:   95 Text Interpretation:  Sinus rhythm Consider right ventricular hypertrophy Artifact When compared with ECG of 09/26/2010 No significant change was found Confirmed by Francine Graven [phone]) on 10/26/2017 10:27:04 PM       Radiology   Procedures Procedures (including  critical care time)  Medications Ordered in ED Medications  0.9 %  sodium chloride infusion (not administered)     Initial Impression / Assessment and Plan / ED Course  I have reviewed the triage vital signs and the nursing notes.  Pertinent labs & imaging results that were available during my care of the patient were reviewed by me and considered in my medical decision making (see chart for details).  MDM Reviewed: previous chart, nursing note and vitals Reviewed previous: labs and ECG Interpretation: labs, ECG, x-ray and CT scan   Results for orders placed or performed during the hospital encounter of 60/45/40  Basic metabolic panel  Result Value Ref Range   Sodium 131 (L) 135 - 145 mmol/L   Potassium 3.9 3.5 - 5.1 mmol/L   Chloride 96 (L) 101 - 111 mmol/L   CO2 26 22 - 32 mmol/L   Glucose, Bld 132 (H) 65 - 99 mg/dL   BUN 16 6 - 20 mg/dL   Creatinine, Ser 0.74 0.44 - 1.00 mg/dL   Calcium 9.5 8.9 - 10.3 mg/dL   GFR calc non Af Amer >60 >60 mL/min   GFR calc Af Amer >60 >60 mL/min   Anion gap 9 5 - 15  CBC  Result Value Ref Range   WBC 13.3 (H) 4.0 - 10.5 K/uL   RBC 4.21 3.87 - 5.11 MIL/uL   Hemoglobin 12.8 12.0 - 15.0 g/dL   HCT 38.9 36.0 - 46.0 %   MCV 92.4 78.0 - 100.0 fL   MCH 30.4 26.0 - 34.0 pg   MCHC 32.9 30.0 - 36.0 g/dL   RDW 12.9 11.5 - 15.5 %   Platelets 283 150 - 400 K/uL  Urinalysis, Routine w reflex microscopic  Result Value Ref Range   Color, Urine YELLOW YELLOW   APPearance CLEAR CLEAR   Specific Gravity, Urine 1.009 1.005 - 1.030   pH 7.0 5.0 - 8.0   Glucose, UA NEGATIVE NEGATIVE mg/dL   Hgb urine dipstick NEGATIVE NEGATIVE   Bilirubin Urine NEGATIVE NEGATIVE   Ketones, ur 5 (A) NEGATIVE mg/dL   Protein, ur 30 (A) NEGATIVE mg/dL   Nitrite NEGATIVE NEGATIVE   Leukocytes, UA NEGATIVE NEGATIVE   RBC / HPF 0-5 0 - 5 RBC/hpf   WBC, UA 0-5 0 - 5 WBC/hpf   Bacteria, UA  RARE (A) NONE SEEN   Squamous Epithelial / LPF 0-5 (A) NONE SEEN   Mucus  PRESENT   Differential  Result Value Ref Range   Neutrophils Relative % 82 %   Neutro Abs 11.0 (H) 1.7 - 7.7 K/uL   Lymphocytes Relative 9 %   Lymphs Abs 1.2 0.7 - 4.0 K/uL   Monocytes Relative 8 %   Monocytes Absolute 1.0 0.1 - 1.0 K/uL   Eosinophils Relative 1 %   Eosinophils Absolute 0.1 0.0 - 0.7 K/uL   Basophils Relative 0 %   Basophils Absolute 0.0 0.0 - 0.1 K/uL  CK  Result Value Ref Range   Total CK 58 38 - 234 U/L  Troponin I  Result Value Ref Range   Troponin I <0.03 <0.03 ng/mL  Lactic acid, plasma  Result Value Ref Range   Lactic Acid, Venous 1.1 0.5 - 1.9 mmol/L   Dg Chest 2 View Result Date: 10/26/2017 CLINICAL DATA:  Sudden onset of leg weakness. EXAM: CHEST  2 VIEW COMPARISON:  Radiographs 11/02/2015 FINDINGS: The cardiomediastinal contours are normal. Mild central bronchial thickening and perihilar atelectasis. Pulmonary vasculature is normal. No consolidation, pleural effusion, or pneumothorax. No acute osseous abnormalities are seen. IMPRESSION: Mild central bronchial thickening and perihilar atelectasis, may be bronchitis or reactive airways disease. Electronically Signed   By: Jeb Levering M.D.   On: 10/26/2017 22:32   Ct Head Wo Contrast Result Date: 10/26/2017 CLINICAL DATA:  Status post fall. Hit forehead against wall. Forehead bruising. EXAM: CT HEAD WITHOUT CONTRAST TECHNIQUE: Contiguous axial images were obtained from the base of the skull through the vertex without intravenous contrast. COMPARISON:  None. FINDINGS: Brain: No evidence of acute infarction, hemorrhage, hydrocephalus, extra-axial collection or mass lesion/mass effect. Prominence of the ventricles and sulci reflects moderately severe cortical volume loss. Cerebellar atrophy is noted. Scattered periventricular and subcortical white matter change likely reflects small vessel ischemic microangiopathy. The brainstem and fourth ventricle are within normal limits. The basal ganglia are unremarkable  in appearance. The cerebral hemispheres demonstrate grossly normal gray-white differentiation. No mass effect or midline shift is seen. Vascular: No hyperdense vessel or unexpected calcification. Skull: There is no evidence of fracture; visualized osseous structures are unremarkable in appearance. Sinuses/Orbits: The visualized portions of the orbits are within normal limits. The paranasal sinuses and mastoid air cells are well-aerated. Other: Soft tissue swelling is noted at the anterior vertex. An adjacent subcutaneous soft tissue nodule is noted. IMPRESSION: 1. No evidence of traumatic intracranial injury or fracture. 2. Soft tissue swelling at the anterior vertex. 3. Moderately severe cortical volume loss and scattered small vessel ischemic microangiopathy. Electronically Signed   By: Garald Balding M.D.   On: 10/26/2017 22:12    2255:  Pt states she tripped and fell 2 weeks ago (walking to the commode at 0200 and tripped), hitting her head against a wall. Pt's forehead and right periorbital area with fading ecchymosis. Pt denies LOC or AMS. Pt denies neck or back pain. Pt able to stand, but unable to walk due to "feeling just too weak."  Will CT CS and LS for completeness.  Pt lives alone, cannot discharge.  T/C to [facility] Dr. Darrick Meigs, case discussed, including:  HPI, pertinent PM/SHx, VS/PE, dx testing, ED course and treatment:  Agreeable to admit.    Final Clinical Impressions(s) / ED Diagnoses   Final diagnoses:  Generalized weakness    ED Discharge Orders    None       Francine Graven, DO  10/29/17 1758  

## 2017-10-26 NOTE — H&P (Addendum)
TRH H&P    Patient Demographics:    Kristi Sawyer, is a 83 y.o. female  MRN: [id number]  DOB - 1926/09/03  Admit Date - 10/26/2017  Referring MD/NP/PA: Dr Thurnell Garbe  Outpatient Primary MD for the patient is Chipper Herb, MD  Patient coming from: home   Chief Complaint  Patient presents with  . Weakness      HPI:    Kristi Sawyer  is a 83 y.o. female, with history of GERD, hyperlipidemia, celiac disease came to hospital as patient developed gradual onset of generalized weakness that began while she was taking a bath around 6 PM tonight.  Patient says that after she was done with bath she was unable to get out of the bathtub and it took her almost 30 minutes to get out of the bathtub.  She had a life alert, and pushed the emergency alert system.  She denies passing out. No dizziness, no chest pain or shortness of breath. No fever or chills. No headache, patient has a large bruise on her forehead, which occurred after patient fell onto the wall from commode while getting out of commode.  This happened a week ago as per patient.  In the ED all the imaging studies were negative. Patient was unable to ambulate in the ED and complained of generalized weakness. CT cervical spine and lumbar spine have been ordered and are currently pending    Review of systems:     All other systems reviewed and are negative.   With Past History of the following :    Past Medical History:  Diagnosis Date  . Acid reflux   . Anemia   . Anxiety   . Cataracts, bilateral   . Celiac disease/sprue   . Chronic gastritis   . Esophageal stricture   . Euthyroid   . Gastritis   . Hiatal hernia   . Hyperlipidemia   . Hypermobility syndrome   . 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 Neuropathy   . Osteoporosis   . Palpitations   . Postmenopausal HRT (hormone replacement therapy)   . Shingles   . Thyroid tumor,  benign       Past Surgical History:  Procedure Laterality Date  . ABDOMINAL HYSTERECTOMY    . APPENDECTOMY  1935  . CATARACT EXTRACTION Bilateral   . COLONOSCOPY    . ESOPHAGOGASTRODUODENOSCOPY    . EYE SURGERY    . MYOMECTOMY    . PARTIAL HYSTERECTOMY  1974   w 1/2 right ovary   . RETINAL LASER PROCEDURE    . SIGMOIDOSCOPY    . THYROIDECTOMY  1975   rt. benign tumor       Social History:      Social History   Tobacco Use  . Smoking status: Never Smoker  . Smokeless tobacco: Never Used  Substance Use Topics  . Alcohol use: No       Family History :     Family History  Problem Relation Age of Onset  . Healthy Mother   .  Lung cancer Father       Home Medications:   Prior to Admission medications   Medication Sig Start Date End Date Taking? Authorizing Provider  aspirin (SB LOW DOSE ASA EC) 81 MG EC tablet Take 81 mg by mouth daily.     Yes [provider]  Calcium Carbonate-Vit D-Min (CALTRATE PLUS PO) Take 1 tablet by mouth daily.    Yes [provider]  Cholecalciferol (VITAMIN D) 2000 UNITS CAPS Take by mouth as directed. Take 1 tablet daily except none on saturdays and sundays   Yes [provider]  Fe Fum-FePoly-Vit C-Vit B3 (INTEGRA) 62.5-62.5-40-3 MG CAPS TAKE 1 CAPSULE BY MOUTH DAILY. 03/19/17  Yes Gatha Mayer, MD  Inulin (FIBER CHOICE FRUITY BITES) 1.5 g CHEW Chew 5 tablets by mouth daily. FIBER CHOICE   Yes [provider]  metoCLOPramide (REGLAN) 10 MG tablet TAKE 1 TABLET BY MOUTH AT BEDTIME 07/20/17  Yes Gatha Mayer, MD  mirabegron ER (MYRBETRIQ) 50 MG TB24 tablet Take 1 tablet (50 mg total) by mouth daily. 09/22/16  Yes Chipper Herb, MD  Multiple Vitamins-Minerals (CENTRUM SILVER PO) Take 1 tablet by mouth daily.    Yes [provider]  Omega-3 Fatty Acids (FISH OIL) 1000 MG CAPS Take 1 capsule by mouth 3 (three) times daily.    Yes [provider]  omeprazole (PRILOSEC) 20 MG capsule  Take 1-2 capsules (20-40 mg total) by mouth daily. Patient taking differently: Take 20-40 mg by mouth daily. 2m in the morning and 423mat bedtime 12/12/16  Yes MoChipper HerbMD  Polyethyl Glycol-Propyl Glycol (SYSTANE OP) Apply 1-2 drops to eye daily as needed (FOR DRY EYE).    Yes [provider]  rosuvastatin (CRESTOR) 5 MG tablet TAKE 1 TABLET ON MON, WED, FRIDAY 10/15/17  Yes MoChipper HerbMD  Sennosides-Docusate Sodium (SENNA PLUS PO) Take 1 tablet by mouth daily as needed (FOR CONSTIPATION).     [provider]  tobramycin-dexamethasone (TBaird Cancerophthalmic solution Apply 1 drop in affected eye 4 times a day Patient not taking: Reported on 10/26/2017 06/09/17   StClaretta FraiseMD     Allergies:     Allergies  Allergen Reactions  . Prevnar 13 [Pneumococcal 13-Val Conj Vacc] Hives    Red/streaking arm  . Penicillins Hives    Has patient had a PCN reaction causing immediate rash, facial/tongue/throat swelling, SOB or lightheadedness with hypotension: No Has patient had a PCN reaction causing severe rash involving mucus membranes or skin necrosis: No Has patient had a PCN reaction that required hospitalization: No Has patient had a PCN reaction occurring within the last 10 years: No If all of the above answers are "NO", then may proceed with Cephalosporin use.   . Tetanus Toxoids Other (See Comments)    Area of injection was red and streaked.  . Codeine Nausea Only  . Sulfonamide Derivatives Nausea Only     Physical Exam:   Vitals  Blood pressure (!) 174/90, pulse 90, temperature 98 F (36.7 C), temperature source Oral, resp. rate (!) 22, height 5' (1.524 m), weight 45.4 kg (100 lb), SpO2 97 %.  1.  General: Appears in no acute distress  2. Psychiatric:  Intact judgement and  insight, awake alert, oriented x 3.  3. Neurologic: No focal neurological deficits, all cranial nerves intact.Strength 5/5 all 4 extremities, sensation intact all 4  extremities, plantars down going.  4. Eyes :  anicteric sclerae, moist conjunctivae with no lid  lag. PERRLA.  5. ENMT:  Oropharynx clear with moist mucous membranes and good dentition  6. Neck:  supple, no cervical lymphadenopathy appriciated, No thyromegaly  7. Respiratory : Normal respiratory effort, good air movement bilaterally,clear to  auscultation bilaterally  8. Cardiovascular : RRR, no gallops, rubs or murmurs, no leg edema  9. Gastrointestinal:  Positive bowel sounds, abdomen soft, non-tender to palpation,no hepatosplenomegaly, no rigidity or guarding       10. Skin:  Ecchymosis noted on the forehead involving right periorbital area.  11.Musculoskeletal:  Good muscle tone,  joints appear normal , no effusions,  normal range of motion    Data Review:    CBC Recent Labs  Lab 10/26/17 2049 10/26/17 2122  WBC 13.3*  --   HGB 12.8  --   HCT 38.9  --   PLT 283  --   MCV 92.4  --   MCH 30.4  --   MCHC 32.9  --   RDW 12.9  --   LYMPHSABS  --  1.2  MONOABS  --  1.0  EOSABS  --  0.1  BASOSABS  --  0.0   ------------------------------------------------------------------------------------------------------------------  Chemistries  Recent Labs  Lab 10/26/17 2049  NA 131*  K 3.9  CL 96*  CO2 26  GLUCOSE 132*  BUN 16  CREATININE 0.74  CALCIUM 9.5   ------------------------------------------------------------------------------------------------------------------  Cardiac Enzymes: Recent Labs  Lab 10/26/17 2122  CKTOTAL 54  TROPONINI <0.03    --------------------------------------------------------------------------------------------------------------- Urine analysis:    Component Value Date/Time   COLORURINE YELLOW 10/26/2017 2135   APPEARANCEUR CLEAR 10/26/2017 2135   LABSPEC 1.009 10/26/2017 2135   PHURINE 7.0 10/26/2017 2135   GLUCOSEU NEGATIVE 10/26/2017 2135   HGBUR NEGATIVE 10/26/2017 2135   BILIRUBINUR NEGATIVE 10/26/2017 2135    BILIRUBINUR neg 07/07/2014 1033   KETONESUR 5 (A) 10/26/2017 2135   PROTEINUR 30 (A) 10/26/2017 2135   UROBILINOGEN negative 07/07/2014 1033   NITRITE NEGATIVE 10/26/2017 2135   LEUKOCYTESUR NEGATIVE 10/26/2017 2135      Imaging Results:    Dg Chest 2 View  Result Date: 10/26/2017 CLINICAL DATA:  Sudden onset of leg weakness. EXAM: CHEST  2 VIEW COMPARISON:  Radiographs 11/02/2015 FINDINGS: The cardiomediastinal contours are normal. Mild central bronchial thickening and perihilar atelectasis. Pulmonary vasculature is normal. No consolidation, pleural effusion, or pneumothorax. No acute osseous abnormalities are seen. IMPRESSION: Mild central bronchial thickening and perihilar atelectasis, may be bronchitis or reactive airways disease. Electronically Signed   By: Jeb Levering M.D.   On: 10/26/2017 22:32   Ct Head Wo Contrast  Result Date: 10/26/2017 CLINICAL DATA:  Status post fall. Hit forehead against wall. Forehead bruising. EXAM: CT HEAD WITHOUT CONTRAST TECHNIQUE: Contiguous axial images were obtained from the base of the skull through the vertex without intravenous contrast. COMPARISON:  None. FINDINGS: Brain: No evidence of acute infarction, hemorrhage, hydrocephalus, extra-axial collection or mass lesion/mass effect. Prominence of the ventricles and sulci reflects moderately severe cortical volume loss. Cerebellar atrophy is noted. Scattered periventricular and subcortical white matter change likely reflects small vessel ischemic microangiopathy. The brainstem and fourth ventricle are within normal limits. The basal ganglia are unremarkable in appearance. The cerebral hemispheres demonstrate grossly normal gray-white differentiation. No mass effect or midline shift is seen. Vascular: No hyperdense vessel or unexpected calcification. Skull: There is no evidence of fracture; visualized osseous structures are unremarkable in appearance. Sinuses/Orbits: The visualized portions of the orbits  are within normal limits. The paranasal sinuses and mastoid air cells  are well-aerated. Other: Soft tissue swelling is noted at the anterior vertex. An adjacent subcutaneous soft tissue nodule is noted. IMPRESSION: 1. No evidence of traumatic intracranial injury or fracture. 2. Soft tissue swelling at the anterior vertex. 3. Moderately severe cortical volume loss and scattered small vessel ischemic microangiopathy. Electronically Signed   By: Garald Balding M.D.   On: 10/26/2017 22:12    My personal review of EKG: Rhythm NSR   Assessment & Plan:    Active Problems:   Generalized weakness   1. Generalized weakness-unclear etiology, patient does take statins.  CK is 58.  CT cervical spine showed no acute changes and CT  lumbar spine shows chronic degenerative changes/ spinall stenosis . PT eval in am. 2. Elevated blood pressure-patient blood pressure has been elevated in the ED, no history of hypertension.  Will start hydralazine 25 mg p.o. every 6 hours as needed for BP more than 160/100. 3. Hyperlipidemia-patient takes rosuvastatin, 3 times a week. Will hold statin at this time due to generalized weakness.    DVT Prophylaxis-    SCDs   AM Labs Ordered, also please review Full Orders  Family Communication: Admission, patients condition and plan of care including tests being ordered have been discussed with the patient and her friend at bedside who indicate understanding and agree with the plan and Code Status.  Code Status: DNR  Admission status: Observation  Time spent in minutes : 55 min   Oswald Hillock M.D on 10/26/2017 at 11:55 PM  Between 7am to 7pm - Pager - [phone]. After 7pm go to [email or web address] - password [facility]  [facility] - Office  [phone]

## 2017-10-26 NOTE — ED Triage Notes (Signed)
Pt could not stand up to get out of bathtub tonight.  Pt had to crawl to bed to push her emergency alert to get help.

## 2017-10-27 ENCOUNTER — Other Ambulatory Visit: Payer: Self-pay

## 2017-10-27 DIAGNOSIS — R531 Weakness: Secondary | ICD-10-CM

## 2017-10-27 DIAGNOSIS — S0083XA Contusion of other part of head, initial encounter: Secondary | ICD-10-CM | POA: Insufficient documentation

## 2017-10-27 LAB — COMPREHENSIVE METABOLIC PANEL
ALT: 17 U/L (ref 14–54)
ANION GAP: 8 (ref 5–15)
AST: 21 U/L (ref 15–41)
Albumin: 3.2 g/dL — ABNORMAL LOW (ref 3.5–5.0)
Alkaline Phosphatase: 52 U/L (ref 38–126)
BUN: 11 mg/dL (ref 6–20)
CHLORIDE: 102 mmol/L (ref 101–111)
CO2: 26 mmol/L (ref 22–32)
Calcium: 9.2 mg/dL (ref 8.9–10.3)
Creatinine, Ser: 0.53 mg/dL (ref 0.44–1.00)
GFR calc non Af Amer: >60 mL/min (ref >60)
Glucose, Bld: 110 mg/dL — ABNORMAL HIGH (ref 65–99)
POTASSIUM: 3.6 mmol/L (ref 3.5–5.1)
SODIUM: 136 mmol/L (ref 135–145)
Total Bilirubin: 0.6 mg/dL (ref 0.3–1.2)
Total Protein: 6.3 g/dL — ABNORMAL LOW (ref 6.5–8.1)

## 2017-10-27 LAB — CBC
HCT: 39.8 % (ref 36.0–46.0)
Hemoglobin: 13.3 g/dL (ref 12.0–15.0)
MCH: 30.7 pg (ref 26.0–34.0)
MCHC: 33.4 g/dL (ref 30.0–36.0)
MCV: 91.9 fL (ref 78.0–100.0)
PLATELETS: 290 10*3/uL (ref 150–400)
RBC: 4.33 MIL/uL (ref 3.87–5.11)
RDW: 12.8 % (ref 11.5–15.5)
WBC: 11.5 10*3/uL — ABNORMAL HIGH (ref 4.0–10.5)

## 2017-10-27 MED ORDER — AMLODIPINE BESYLATE 5 MG PO TABS
5.0000 mg | ORAL_TABLET | Freq: Every day | ORAL | Status: DC
  Administered 2017-10-27 – 2017-10-28 (×2): 5 mg via ORAL
  Filled 2017-10-27 (×2): qty 1

## 2017-10-27 MED ORDER — ACETAMINOPHEN 325 MG PO TABS: 650.0000 mg | ORAL_TABLET | Freq: Four times a day (QID) | ORAL | Status: DC | PRN

## 2017-10-27 MED ORDER — PANTOPRAZOLE SODIUM 40 MG PO TBEC
40.0000 mg | DELAYED_RELEASE_TABLET | Freq: Every day | ORAL | Status: DC
  Administered 2017-10-27 – 2017-10-28 (×2): 40 mg via ORAL
  Filled 2017-10-27 (×2): qty 1

## 2017-10-27 MED ORDER — SODIUM CHLORIDE 0.9 % IV SOLN
INTRAVENOUS | Status: DC
  Administered 2017-10-27 (×2): via INTRAVENOUS

## 2017-10-27 MED ORDER — HYDRALAZINE HCL 10 MG PO TABS
10.0000 mg | ORAL_TABLET | Freq: Three times a day (TID) | ORAL | Status: DC
  Administered 2017-10-27 – 2017-10-28 (×3): 10 mg via ORAL
  Filled 2017-10-27 (×4): qty 1

## 2017-10-27 MED ORDER — ENSURE ENLIVE PO LIQD
237.0000 mL | Freq: Two times a day (BID) | ORAL | Status: DC
  Administered 2017-10-27 – 2017-10-28 (×3): 237 mL via ORAL

## 2017-10-27 MED ORDER — ACETAMINOPHEN 650 MG RE SUPP: 650.0000 mg | Freq: Four times a day (QID) | RECTAL | Status: DC | PRN

## 2017-10-27 MED ORDER — ONDANSETRON HCL 4 MG/2ML IJ SOLN: 4.0000 mg | Freq: Four times a day (QID) | INTRAMUSCULAR | Status: DC | PRN

## 2017-10-27 MED ORDER — IPRATROPIUM-ALBUTEROL 0.5-2.5 (3) MG/3ML IN SOLN
3.0000 mL | Freq: Four times a day (QID) | RESPIRATORY_TRACT | Status: DC
  Administered 2017-10-27: 3 mL via RESPIRATORY_TRACT
  Filled 2017-10-27: qty 3

## 2017-10-27 MED ORDER — METOCLOPRAMIDE HCL 10 MG PO TABS
10.0000 mg | ORAL_TABLET | Freq: Every day | ORAL | Status: DC
  Administered 2017-10-27 (×2): 10 mg via ORAL
  Filled 2017-10-27 (×2): qty 1

## 2017-10-27 MED ORDER — MIRABEGRON ER 25 MG PO TB24
50.0000 mg | ORAL_TABLET | Freq: Every day | ORAL | Status: DC
  Administered 2017-10-27 – 2017-10-28 (×2): 50 mg via ORAL
  Filled 2017-10-27 (×2): qty 2

## 2017-10-27 MED ORDER — ASPIRIN EC 81 MG PO TBEC
81.0000 mg | DELAYED_RELEASE_TABLET | Freq: Every day | ORAL | Status: DC
  Administered 2017-10-27 – 2017-10-28 (×2): 81 mg via ORAL
  Filled 2017-10-27 (×2): qty 1

## 2017-10-27 MED ORDER — HYDRALAZINE HCL 25 MG PO TABS: 25.0000 mg | ORAL_TABLET | Freq: Four times a day (QID) | ORAL | Status: DC | PRN

## 2017-10-27 MED ORDER — IPRATROPIUM-ALBUTEROL 0.5-2.5 (3) MG/3ML IN SOLN: 3.0000 mL | Freq: Four times a day (QID) | RESPIRATORY_TRACT | Status: DC | PRN

## 2017-10-27 MED ORDER — ONDANSETRON HCL 4 MG PO TABS: 4.0000 mg | ORAL_TABLET | Freq: Four times a day (QID) | ORAL | Status: DC | PRN

## 2017-10-27 MED ORDER — IPRATROPIUM-ALBUTEROL 0.5-2.5 (3) MG/3ML IN SOLN: 3.0000 mL | Freq: Three times a day (TID) | RESPIRATORY_TRACT | Status: DC

## 2017-10-27 NOTE — Plan of Care (Signed)
  Acute Rehab PT Goals(only PT should resolve) Pt Will Go Supine/Side To Sit 10/27/2017 0906 - Progressing by Lonell Grandchild, PT Flowsheets Taken 10/27/2017 0906  Pt will go Supine/Side to Sit Independently Patient Will Transfer Sit To/From Stand 10/27/2017 0906 - Progressing by Lonell Grandchild, PT Flowsheets Taken 10/27/2017 [id number]  Patient will transfer sit to/from stand with modified independence Pt Will Transfer Bed To Chair/Chair To Bed 10/27/2017 0906 - Progressing by Lonell Grandchild, PT Flowsheets Taken 10/27/2017 0906  Pt will Transfer Bed to Chair/Chair to Bed with modified independence Pt Will Ambulate 10/27/2017 0906 - Progressing by Lonell Grandchild, PT Flowsheets Taken 10/27/2017 0906  Pt will Ambulate 75 feet;with modified independence;with rolling walker Pt Will Go Up/Down Stairs 10/27/2017 0906 - Progressing by Lonell Grandchild, PT Flowsheets Taken 10/27/2017 0906  Pt will Go Up / Down Stairs 3-5 stairs;with rail(s)  9:08 AM, 10/27/17 Lonell Grandchild, MPT Physical Therapist with [facility] [phone] office [phone] mobile phone

## 2017-10-27 NOTE — Progress Notes (Signed)
Nutrition Brief Note  Patient identified on the Malnutrition Screening Tool (MST) Report. Pt reported on MST a decreased intake due to loss of appetite and a history of unintentional weight loss.   Wt Readings from Last 15 Encounters:  10/27/17 101 lb 6.6 oz (46 kg)  08/07/17 101 lb (45.8 kg)  06/09/17 100 lb 6 oz (45.5 kg)  04/16/17 101 lb 8 oz (46 kg)  03/28/17 99 lb (44.9 kg)  11/23/16 100 lb (45.4 kg)  10/17/16 99 lb 6 oz (45.1 kg)  09/22/16 97 lb 12.8 oz (44.4 kg)  08/22/16 98 lb 9.6 oz (44.7 kg)  07/25/16 98 lb (44.5 kg)  07/24/16 95 lb 12.8 oz (43.5 kg)  04/14/16 102 lb (46.3 kg)  03/17/16 100 lb (45.4 kg)  11/02/15 107 lb (48.5 kg)  10/29/15 110 lb (49.9 kg)   Body mass index is 19.81 kg/m. Patient meets criteria for healthy wt for ht based on current BMI.   Despite MST report, Chart history shows that patients weight has been very stable for the the past 18 months.   Her work up thus far has been largely unrevealing and MD is suspecting of mild dehydration. She ate 50% of her one documented meal and appears to have consumed her Ensure.   There is no reports of n/v/c/d, loss of appetite, weight loss in any area of chart other than MST. Will continue Ensure and monitor for now.   No nutrition interventions warranted at this time. If nutrition issues arise, please consult RD.   Burtis Junes RD, LDN, CNSC Clinical Nutrition Pager: [phone] 10/27/2017 2:51 PM

## 2017-10-27 NOTE — Evaluation (Signed)
Physical Therapy Evaluation Patient Details Name: Kristi Sawyer MRN: [id number] DOB: 04/24/1926 Today's Date: 10/27/2017   History of Present Illness  Kristi Sawyer  is a 83 y.o. female, with history of GERD, hyperlipidemia, celiac disease came to hospital as patient developed gradual onset of generalized weakness that began while she was taking a bath around 6 PM tonight.  Patient says that after she was done with bath she was unable to get out of the bathtub and it took her almost 30 minutes to get out of the bathtub.  She had a life alert, and pushed the emergency alert system.    Clinical Impression  Patient demonstrates slow slightly labored movement for sitting up at bedside, transfers and ambulating with RW, presently a fall risk if not using RW and tolerated sitting up in chair to eat breakfast - RN notified patient up in chair.  Patient will benefit from continued physical therapy in hospital and recommended venue below to increase strength, balance, endurance for safe ADLs and gait.    Follow Up Recommendations SNF    Equipment Recommendations  None recommended by PT    Recommendations for Other Services       Precautions / Restrictions Precautions Precautions: Fall Restrictions Weight Bearing Restrictions: No      Mobility  Bed Mobility Overal bed mobility: Needs Assistance Bed Mobility: Supine to Sit     Supine to sit: Supervision        Transfers Overall transfer level: Needs assistance Equipment used: None;Rolling walker (2 wheeled) Transfers: Sit to/from [employer] Transfers Sit to Stand: Min guard Stand pivot transfers: Min guard       General transfer comment: Min guard with RW and without using AD  Ambulation/Gait Ambulation/Gait assistance: Min guard Ambulation Distance (Feet): 30 Feet Assistive device: Rolling walker (2 wheeled) Gait Pattern/deviations: Decreased step length - right;Decreased step length - left;Decreased stride length    Gait velocity interpretation: Below normal speed for age/gender General Gait Details: demonstrates slow slightly labored cadence without loss of balance, limited secondary to knees feeling weak  Stairs            Wheelchair Mobility    Modified Rankin (Stroke Patients Only)       Balance Overall balance assessment: Needs assistance Sitting-balance support: No upper extremity supported;Feet supported Sitting balance-Leahy Scale: Good     Standing balance support: No upper extremity supported;During functional activity Standing balance-Leahy Scale: Fair Standing balance comment: fair/good using RW                             Pertinent Vitals/Pain Pain Assessment: No/denies pain    Home Living Family/patient expects to be discharged to:: Private residence Living Arrangements: Alone Available Help at Discharge: Other (Comment)(does not have any help per patient) Type of Home: House Home Access: Stairs to enter Entrance Stairs-Rails: Right;Left;Can reach both Entrance Stairs-Number of Steps: 3 Home Layout: One level Home Equipment: Walker - 2 wheels;Shower seat;Cane - single point      Prior Function Level of Independence: Independent         Comments: usually ambulates without assistive device (AD)     Hand Dominance        Extremity/Trunk Assessment   Upper Extremity Assessment Upper Extremity Assessment: Generalized weakness    Lower Extremity Assessment Lower Extremity Assessment: Generalized weakness    Cervical / Trunk Assessment Cervical / Trunk Assessment: Normal  Communication   Communication: No difficulties  Cognition Arousal/Alertness: Awake/alert Behavior During Therapy: WFL for tasks assessed/performed Overall Cognitive Status: Within Functional Limits for tasks assessed                                        General Comments      Exercises     Assessment/Plan    PT Assessment Patient needs  continued PT services  PT Problem List Decreased strength;Decreased activity tolerance;Decreased balance;Decreased mobility       PT Treatment Interventions Gait training;Stair training;Functional mobility training;Therapeutic exercise;Patient/family education    PT Goals (Current goals can be found in the Care Plan section)  Acute Rehab PT Goals Patient Stated Goal: return home when she's stronger PT Goal Formulation: With patient Time For Goal Achievement: 10/30/17 Potential to Achieve Goals: Good    Frequency Min 3X/week   Barriers to discharge        Co-evaluation               AM-PAC PT "6 Clicks" Daily Activity  Outcome Measure   Difficulty moving from lying on back to sitting on the side of the bed? : None Difficulty sitting down on and standing up from a chair with arms (e.g., wheelchair, bedside commode, etc,.)?: None Help needed moving to and from a bed to chair (including a wheelchair)?: A Little Help needed walking in hospital room?: A Little Help needed climbing 3-5 steps with a railing? : A Little 6 Click Score: 17    End of Session   Activity Tolerance: Patient tolerated treatment well;Patient limited by fatigue Patient left: in chair;with call bell/phone within reach Nurse Communication: Mobility status PT Visit Diagnosis: Unsteadiness on feet (R26.81);Other abnormalities of gait and mobility (R26.89);Muscle weakness (generalized) (M62.81)    Time: 7482-7078 PT Time Calculation (min) (ACUTE ONLY): 29 min   Charges:   PT Evaluation $PT Eval Moderate Complexity: 1 Mod PT Treatments $Therapeutic Activity: 23-37 mins   PT G Codes:   PT G-Codes **NOT FOR INPATIENT CLASS** Functional Assessment Tool Used: AM-PAC 6 Clicks Basic Mobility Functional Limitation: Mobility: Walking and moving around Mobility: Walking and Moving Around Current Status (M7544): At least 40 percent but less than 60 percent impaired, limited or restricted Mobility: Walking  and Moving Around Goal Status [phone]): At least 40 percent but less than 60 percent impaired, limited or restricted Mobility: Walking and Moving Around Discharge Status [phone]): At least 40 percent but less than 60 percent impaired, limited or restricted    9:05 AM, 10/27/17 Lonell Grandchild, MPT Physical Therapist with [facility] [phone] office [phone] mobile phone

## 2017-10-27 NOTE — Progress Notes (Signed)
Suspect she has atelectasis more than reactive airway disease ( no hx of asthma family) will make nebs prn. No wheezes after neb or significant coughing. Started on incentive.

## 2017-10-27 NOTE — Progress Notes (Signed)
PROGRESS NOTE  DAYLE SHERPA  NWG:[id number]  DOB: September 25, 1926  DOA: 10/26/2017 PCP: Chipper Herb, MD   Brief Admission Hx:  Deshia Vanderhoof  is a 83 y.o. female, with history of GERD, hyperlipidemia, celiac disease came to hospital as patient developed gradual onset of generalized weakness that began while she was taking a bath around 6 PM tonight.  Patient says that after she was done with bath she was unable to get out of the bathtub and it took her almost 30 minutes to get out of the bathtub.  She had a life alert, and pushed the emergency alert system.  MDM/Assessment & Plan:   1. Generalized weakness-unclear etiology, but I suspect due to mild dehydration, seems to be improving with IVFs.  CK is 58.  CT cervical spine showed no acute changes and CT  lumbar spine shows chronic degenerative changes/ spinall stenosis . PT recommending SNF placement. Will consult Education officer, museum.  If needs a qualifying 3 night stay, may have to discharge home with home health and place in SNF from home.  2. Elevated blood pressure-patient blood pressure has been elevated in the ED, no history of hypertension.  started amlodipine 5 mg daily and low dose hydralazine.  3. Hyperlipidemia-patient takes rosuvastatin, 3 times a week. Will hold statin at this time due to generalized weakness.  DVT Prophylaxis-    SCDs   AM Labs Ordered, also please review Full Orders  Family Communication: Admission, patients condition and plan of care including tests being ordered have been discussed with the patient and her friend at bedside who indicate understanding and agree with the plan and Code Status.   Subjective: Pt says that she still feels pretty weak but has gotten up to the bathroom.    Objective: Vitals:   10/27/17 0000 10/27/17 0037 10/27/17 0100 10/27/17 0500  BP: (!) 176/103 (!) 166/97 (!) 177/88 (!) 165/91  Pulse: 88 79 80 89  Resp: 18 18 18 18   Temp:   98.4 F (36.9 C) 97.7 F (36.5 C)  TempSrc:    Oral Oral  SpO2: 97% 94% 97% 95%  Weight:   46 kg (101 lb 6.6 oz)   Height:   5' (1.524 m)     Intake/Output Summary (Last 24 hours) at 10/27/2017 1105 Last data filed at 10/27/2017 0900 Gross per 24 hour  Intake 348.75 ml  Output 350 ml  Net -1.25 ml   Filed Weights   10/26/17 2022 10/27/17 0100  Weight: 45.4 kg (100 lb) 46 kg (101 lb 6.6 oz)    REVIEW OF SYSTEMS  As per history otherwise all reviewed and reported negative  Exam:  General exam: awake, alert, NAD. Cooperative.  Respiratory system: Clear. No increased work of breathing. Cardiovascular system: S1 & S2 heard. Gastrointestinal system: Abdomen is nondistended, soft and nontender. Normal bowel sounds heard. Central nervous system: Alert and oriented. No focal neurological deficits. Extremities: no CCE.  Data Reviewed: Basic Metabolic Panel: Recent Labs  Lab 10/26/17 2049 10/27/17 0609  NA 131* 136  K 3.9 3.6  CL 96* 102  CO2 26 26  GLUCOSE 132* 110*  BUN 16 11  CREATININE 0.74 0.53  CALCIUM 9.5 9.2   Liver Function Tests: Recent Labs  Lab 10/27/17 0609  AST 21  ALT 17  ALKPHOS 52  BILITOT 0.6  PROT 6.3*  ALBUMIN 3.2*   No results for input(s): LIPASE, AMYLASE in the last 168 hours. No results for input(s): AMMONIA in the last 168 hours.  CBC: Recent Labs  Lab 10/26/17 2049 10/26/17 2122 10/27/17 0609  WBC 13.3*  --  11.5*  NEUTROABS  --  11.0*  --   HGB 12.8  --  13.3  HCT 38.9  --  39.8  MCV 92.4  --  91.9  PLT 283  --  290   Cardiac Enzymes: Recent Labs  Lab 10/26/17 2122  CKTOTAL 44  TROPONINI <0.03   CBG (last 3)  No results for input(s): GLUCAP in the last 72 hours. No results found for this or any previous visit (from the past 240 hour(s)).   Studies: Dg Chest 2 View  Result Date: 10/26/2017 CLINICAL DATA:  Sudden onset of leg weakness. EXAM: CHEST  2 VIEW COMPARISON:  Radiographs 11/02/2015 FINDINGS: The cardiomediastinal contours are normal. Mild central  bronchial thickening and perihilar atelectasis. Pulmonary vasculature is normal. No consolidation, pleural effusion, or pneumothorax. No acute osseous abnormalities are seen. IMPRESSION: Mild central bronchial thickening and perihilar atelectasis, may be bronchitis or reactive airways disease. Electronically Signed   By: Jeb Levering M.D.   On: 10/26/2017 22:32   Ct Head Wo Contrast  Result Date: 10/26/2017 CLINICAL DATA:  Status post fall. Hit forehead against wall. Forehead bruising. EXAM: CT HEAD WITHOUT CONTRAST TECHNIQUE: Contiguous axial images were obtained from the base of the skull through the vertex without intravenous contrast. COMPARISON:  None. FINDINGS: Brain: No evidence of acute infarction, hemorrhage, hydrocephalus, extra-axial collection or mass lesion/mass effect. Prominence of the ventricles and sulci reflects moderately severe cortical volume loss. Cerebellar atrophy is noted. Scattered periventricular and subcortical white matter change likely reflects small vessel ischemic microangiopathy. The brainstem and fourth ventricle are within normal limits. The basal ganglia are unremarkable in appearance. The cerebral hemispheres demonstrate grossly normal gray-white differentiation. No mass effect or midline shift is seen. Vascular: No hyperdense vessel or unexpected calcification. Skull: There is no evidence of fracture; visualized osseous structures are unremarkable in appearance. Sinuses/Orbits: The visualized portions of the orbits are within normal limits. The paranasal sinuses and mastoid air cells are well-aerated. Other: Soft tissue swelling is noted at the anterior vertex. An adjacent subcutaneous soft tissue nodule is noted. IMPRESSION: 1. No evidence of traumatic intracranial injury or fracture. 2. Soft tissue swelling at the anterior vertex. 3. Moderately severe cortical volume loss and scattered small vessel ischemic microangiopathy. Electronically Signed   By: Garald Balding  M.D.   On: 10/26/2017 22:12   Ct Cervical Spine Wo Contrast  Result Date: 10/26/2017 CLINICAL DATA:  Status post fall, with concern for cervical spine injury. Leg weakness. EXAM: CT CERVICAL SPINE WITHOUT CONTRAST TECHNIQUE: Multidetector CT imaging of the cervical spine was performed without intravenous contrast. Multiplanar CT image reconstructions were also generated. COMPARISON:  None. FINDINGS: Alignment: Normal. Skull base and vertebrae: No acute fracture. No primary bone lesion or focal pathologic process. Soft tissues and spinal canal: No prevertebral fluid or swelling. No visible canal hematoma. Disc levels: Diffuse multilevel disc space narrowing is noted along the cervical spine, with scattered small anterior and posterior disc osteophyte complexes. Upper chest: A 1.3 cm hypodensity at the right thyroid lobe is likely benign, given its size. The visualized lung apices are clear. Mild calcification is seen at the carotid bifurcations bilaterally. Other: There is diffuse cerebellar atrophy. IMPRESSION: 1. No evidence of fracture or subluxation along the cervical spine. 2. Mild diffuse degenerative change along the cervical spine. 3. Mild calcification at the carotid bifurcations bilaterally. 4. Cerebellar atrophy noted. Electronically Signed   By:  Garald Balding M.D.   On: 10/26/2017 23:58   Ct Lumbar Spine Wo Contrast  Result Date: 10/27/2017 CLINICAL DATA:  Initial evaluation for recent fall, leg weakness. EXAM: CT LUMBAR SPINE WITHOUT CONTRAST TECHNIQUE: Multidetector CT imaging of the lumbar spine was performed without intravenous contrast administration. Multiplanar CT image reconstructions were also generated. COMPARISON:  Prior radiograph from 05/04/2016. FINDINGS: Segmentation: Normal segmentation. Lowest well-formed disc labeled the L5-S1 level. Alignment: Levoscoliosis with apex at L2-3. Mild straightening of the normal lumbar lordosis. No listhesis. Vertebrae: Vertebral body heights  maintained without evidence for acute or chronic fracture. No discrete lytic or blastic osseous lesions. Visualized sacrum and bony pelvis intact. SI joints approximated and symmetric. Paraspinal and other soft tissues: Paraspinous soft tissues demonstrate no acute abnormality. Aortic atherosclerosis noted. Partially visualized visceral structures within normal limits. Disc levels: L1-2: Degenerative intervertebral disc space narrowing with disc desiccation. Diffuse disc bulge, eccentric to the left. No canal or foraminal stenosis. L2-3: Chronic intervertebral disc space narrowing with disc desiccation and diffuse disc bulge. Right far lateral/extraforaminal reactive endplate changes with marginal endplate osteophytosis. No canal stenosis. Resultant mild to moderate right L2 foraminal narrowing with probable mild right lateral recess stenosis. Central canal widely patent. No left foraminal encroachment. L3-4: Chronic intervertebral disc space narrowing with diffuse disc bulge and disc desiccation. Right far lateral/extraforaminal reactive endplate changes. Moderate facet hypertrophy, right greater than left. Moderate right L3 foraminal narrowing. No significant canal or left foraminal stenosis. L4-5: Diffuse disc bulge with disc desiccation and intervertebral disc space narrowing. Reactive endplate changes, most notable at the left far lateral position. Superimposed central disc protrusion indents the ventral thecal sac (series 5, image 76). Mild to moderate left lateral recess stenosis. Moderate left L4 foraminal narrowing. Right neural foramen remains widely patent. L5-S1: Chronic intervertebral disc space narrowing with diffuse disc bulge and disc desiccation. Circumferential reactive endplate changes, most prominent at the left far lateral position. Moderate facet arthrosis, slightly worse on the left. Mild bilateral lateral recess stenosis without significant canal narrowing. Severe left with mild right L5  foraminal stenosis. IMPRESSION: 1. No acute fracture or other abnormality within the lumbar spine. 2. Levoscoliosis with multilevel degenerative spondylolysis as above. Resultant mild to moderate lateral recess narrowing on the right and L2-3, on the left at L4-5, and bilaterally at L5-S1. 3. Multifactorial degenerative changes with resultant multilevel foraminal narrowing as above. Notable findings include moderate right L2 and L3 foraminal stenosis, moderate left L4 foraminal stenosis, with severe left L5 foraminal narrowing. Electronically Signed   By: Jeannine Boga M.D.   On: 10/27/2017 00:10     Scheduled Meds: . aspirin EC  81 mg Oral Daily  . feeding supplement (ENSURE ENLIVE)  237 mL Oral BID BM  . metoCLOPramide  10 mg Oral QHS  . mirabegron ER  50 mg Oral Daily  . pantoprazole  40 mg Oral Daily   Continuous Infusions: . sodium chloride 75 mL/hr at 10/27/17 0133    Active Problems:   Generalized weakness   Time spent:   Irwin Brakeman, MD, FAAFP [facility] Pager [phone]  If 7PM-7AM, please contact night-coverage [email or web address] Password [email or web address] 10/27/2017, 11:05 AM    LOS: 0 days

## 2017-10-28 DIAGNOSIS — I10 Essential (primary) hypertension: Secondary | ICD-10-CM

## 2017-10-28 DIAGNOSIS — R531 Weakness: Secondary | ICD-10-CM | POA: Diagnosis not present

## 2017-10-28 LAB — URINE CULTURE: CULTURE: NO GROWTH

## 2017-10-28 MED ORDER — AMLODIPINE BESYLATE 5 MG PO TABS: 5.0000 mg | ORAL_TABLET | Freq: Every day | ORAL | 0 refills | Status: DC

## 2017-10-28 MED ORDER — HYDRALAZINE HCL 10 MG PO TABS: 10.0000 mg | ORAL_TABLET | Freq: Three times a day (TID) | ORAL | 0 refills | Status: DC

## 2017-10-28 NOTE — Discharge Instructions (Signed)
Follow with Primary MD  Chipper Herb, MD  and other consultant's as instructed your Hospitalist MD  Please get a complete blood count and chemistry panel checked by your Primary MD at your next visit, and again as instructed by your Primary MD.  Get Medicines reviewed and adjusted: Please take all your medications with you for your next visit with your Primary MD  Laboratory/radiological data: Please request your Primary MD to go over all hospital tests and procedure/radiological results at the follow up, please ask your Primary MD to get all Hospital records sent to his/her office.  In some cases, they will be blood work, cultures and biopsy results pending at the time of your discharge. Please request that your primary care M.D. follows up on these results.  Also Note the following: If you experience worsening of your admission symptoms, develop shortness of breath, life threatening emergency, suicidal or homicidal thoughts you must seek medical attention immediately by calling 911 or calling your MD immediately  if symptoms less severe.  You must read complete instructions/literature along with all the possible adverse reactions/side effects for all the Medicines you take and that have been prescribed to you. Take any new Medicines after you have completely understood and accpet all the possible adverse reactions/side effects.   Do not drive when taking Pain medications or sleeping medications (Benzodaizepines)  Do not take more than prescribed Pain, Sleep and Anxiety Medications. It is not advisable to combine anxiety,sleep and pain medications without talking with your primary care practitioner  Special Instructions: If you have smoked or chewed Tobacco  in the last 2 yrs please stop smoking, stop any regular Alcohol  and or any Recreational drug use.  Wear Seat belts while driving.  Please note: You were cared for by a hospitalist during your hospital stay. Once you are discharged,  your primary care physician will handle any further medical issues. Please note that NO REFILLS for any discharge medications will be authorized once you are discharged, as it is imperative that you return to your primary care physician (or establish a relationship with a primary care physician if you do not have one) for your post hospital discharge needs so that they can reassess your need for medications and monitor your lab values.

## 2017-10-28 NOTE — Care Management Note (Signed)
Case Management Note  Patient Details  Name: TARINI CARRIER MRN: [id number] Date of Birth: 08-01-1926  Subjective/Objective:     Pt presented for weakness. Pt assessed over the phone.  Pt has not used HH previously and has walker, cane, and tub bench.  SNF is recommended but pt is in observation and does not qualify for SNF admission.         Action/Plan: Pt agreeable to [facility].  Pt read choices of agencies and she states she has no preference.  [facility] chosen by patient.  Jermaine with [facility] contacted for referral.  SOC to begin with 24-48 hours.  Pt states she does not need any other equipment at this time.    Expected Discharge Date:                  Expected Discharge Plan:  [place]  In-House Referral:  NA  Discharge planning Services  CM Consult  Post Acute Care Choice:  Home Health Choice offered to:  Patient(read over phone)  DME Arranged:  N/A DME Agency:  NA  HH Arranged:  RN, PT, OT, Nurse's Aide, Social Work [employer] Agency:  [place]  Status of Service:  Completed, signed off  If discussed at [employer], dates discussed:    Additional Comments:  Arley Phenix, RN 10/28/2017, 9:59 AM

## 2017-10-28 NOTE — Discharge Summary (Signed)
Physician Discharge Summary  DENIELLE BAYARD ERD:[id number] DOB: 1926-10-30 DOA: 10/26/2017  PCP: Chipper Herb, MD  Admit date: 10/26/2017 Discharge date: 10/28/2017  Admitted From: Home  Disposition: Home with [facility] Services Recommendations for Outpatient Follow-up:  1. Follow up with PCP in 5-7 days for recheck 2. Please obtain BMP/CBC in one week 3. Please arrange outpatient SNF placement  Home Health: YES HH Arranged:  RN, PT, OT, Nurse's Aide, Social Work [employer] Agency:  [facility]  Discharge Condition: STABLE  CODE STATUS: DNR   Brief Hospitalization Summary: Please see all hospital notes, images, labs for full details of the hospitalization.   HPI:   Kristi Sawyer  is a 83 y.o. female, with history of GERD, hyperlipidemia, celiac disease came to hospital as patient developed gradual onset of generalized weakness that began while she was taking a bath around 6 PM tonight.  Patient says that after she was done with bath she was unable to get out of the bathtub and it took her almost 30 minutes to get out of the bathtub.  She had a life alert, and pushed the emergency alert system.  She denies passing out. No dizziness, no chest pain or shortness of breath. No fever or chills. No headache, patient has a large bruise on her forehead, which occurred after patient fell onto the wall from commode while getting out of commode.  This happened a week ago as per patient.  In the ED all the imaging studies were negative. Patient was unable to ambulate in the ED and complained of generalized weakness. CT cervical spine and lumbar spine have been ordered and are currently pending  Brief Admission Hx:  [name] a89 y.o.female,with history of GERD, hyperlipidemia, celiac disease came to hospital as patient developed gradual onset of generalized weakness that began while she was taking a bath around 6 PM tonight. Patient says that after she was done with bath she was  unable to get out of the bathtub and it took her almost 30 minutes to get out of the bathtub. She had a life alert,and pushed the emergency alert system.  MDM/Assessment & Plan:   1. Generalized weakness-unclear etiology, but I suspect due to mild dehydration, seems to be improved with IVFs. Urinalysis was OK.  CK is 58.CT cervical spine showed no acute changesand CTlumbar spine shows chronic degenerative changes/spinal stenosis.PT recommending SNF placement but patient does not have 3 night qualifying stay for hospital SNF placement.   Pt is under observation only, Will have to discharge home with home health and place in SNF from home.  2. Uncontrolled Hypertension / Elevated blood pressure-patient's blood pressure has been elevated in the ED, no history of hypertension. started amlodipine 5 mg daily and low dose hydralazine.  BP is better...will need to follow up outpatient with PCP for further management.  3. Hyperlipidemia-patient takes rosuvastatin, 3 times a week. Can resume at discharge.   DVT Prophylaxis- SCDs   Discharge Diagnoses:  Active Problems:   Generalized weakness   Uncontrolled hypertension    Discharge Instructions: Discharge Instructions    Call MD for:  difficulty breathing, headache or visual disturbances   Complete by:  As directed    Call MD for:  extreme fatigue   Complete by:  As directed    Call MD for:  hives   Complete by:  As directed    Call MD for:  persistant dizziness or light-headedness   Complete by:  As directed    Call  MD for:  persistant nausea and vomiting   Complete by:  As directed    Diet - low sodium heart healthy   Complete by:  As directed    Increase activity slowly   Complete by:  As directed      Allergies as of 10/28/2017      Reactions   Prevnar 13 [pneumococcal 13-val Conj Vacc] Hives   Red/streaking arm   Penicillins Hives   Has patient had a PCN reaction causing immediate rash, facial/tongue/throat  swelling, SOB or lightheadedness with hypotension: No Has patient had a PCN reaction causing severe rash involving mucus membranes or skin necrosis: No Has patient had a PCN reaction that required hospitalization: No Has patient had a PCN reaction occurring within the last 10 years: No If all of the above answers are "NO", then may proceed with Cephalosporin use.   Tetanus Toxoids Other (See Comments)   Area of injection was red and streaked.   Codeine Nausea Only   Sulfonamide Derivatives Nausea Only      Medication List    STOP taking these medications   tobramycin-dexamethasone ophthalmic solution Commonly known as:  TOBRADEX     TAKE these medications   amLODipine 5 MG tablet Commonly known as:  NORVASC Take 1 tablet (5 mg total) by mouth daily.   CALTRATE PLUS PO Take 1 tablet by mouth daily.   CENTRUM SILVER PO Take 1 tablet by mouth daily.   FIBER CHOICE FRUITY BITES 1.5 g Chew Generic drug:  Inulin Chew 5 tablets by mouth daily. FIBER CHOICE   Fish Oil 1000 MG Caps Take 1 capsule by mouth 3 (three) times daily.   hydrALAZINE 10 MG tablet Commonly known as:  APRESOLINE Take 1 tablet (10 mg total) by mouth every 8 (eight) hours.   INTEGRA 62.5-62.5-40-3 MG Caps TAKE 1 CAPSULE BY MOUTH DAILY.   metoCLOPramide 10 MG tablet Commonly known as:  REGLAN TAKE 1 TABLET BY MOUTH AT BEDTIME   mirabegron ER 50 MG Tb24 tablet Commonly known as:  MYRBETRIQ Take 1 tablet (50 mg total) by mouth daily.   omeprazole 20 MG capsule Commonly known as:  PRILOSEC Take 1-2 capsules (20-40 mg total) by mouth daily. What changed:  additional instructions   rosuvastatin 5 MG tablet Commonly known as:  CRESTOR TAKE 1 TABLET ON MON, WED, FRIDAY   SB LOW DOSE ASA EC 81 MG EC tablet Generic drug:  aspirin Take 81 mg by mouth daily.   SENNA PLUS PO Take 1 tablet by mouth daily as needed (FOR CONSTIPATION).   SYSTANE OP Apply 1-2 drops to eye daily as needed (FOR DRY EYE).    Vitamin D 2000 units Caps Take by mouth as directed. Take 1 tablet daily except none on saturdays and [place]-Home Follow up.   Specialty:  Home Health Services Why:  They will contact you to schedule home visits.  Physical therapist, Occupational therapist, RN, Nurse's Aide, and Social worker will visit. Contact information: [place] [address] [phone]        Chipper Herb, MD. Schedule an appointment as soon as possible for a visit in 5 day(s).   Specialty:  Family Medicine Why:  Hospital Follow Up Contact information: [address] [place] [address] [phone]          Allergies  Allergen Reactions  . Prevnar 13 [Pneumococcal 13-Val Conj Vacc] Hives  Red/streaking arm  . Penicillins Hives    Has patient had a PCN reaction causing immediate rash, facial/tongue/throat swelling, SOB or lightheadedness with hypotension: No Has patient had a PCN reaction causing severe rash involving mucus membranes or skin necrosis: No Has patient had a PCN reaction that required hospitalization: No Has patient had a PCN reaction occurring within the last 10 years: No If all of the above answers are "NO", then may proceed with Cephalosporin use.   . Tetanus Toxoids Other (See Comments)    Area of injection was red and streaked.  . Codeine Nausea Only  . Sulfonamide Derivatives Nausea Only   Allergies as of 10/28/2017      Reactions   Prevnar 13 [pneumococcal 13-val Conj Vacc] Hives   Red/streaking arm   Penicillins Hives   Has patient had a PCN reaction causing immediate rash, facial/tongue/throat swelling, SOB or lightheadedness with hypotension: No Has patient had a PCN reaction causing severe rash involving mucus membranes or skin necrosis: No Has patient had a PCN reaction that required hospitalization: No Has patient had a PCN reaction occurring within the last 10 years: No If  all of the above answers are "NO", then may proceed with Cephalosporin use.   Tetanus Toxoids Other (See Comments)   Area of injection was red and streaked.   Codeine Nausea Only   Sulfonamide Derivatives Nausea Only      Medication List    STOP taking these medications   tobramycin-dexamethasone ophthalmic solution Commonly known as:  TOBRADEX     TAKE these medications   amLODipine 5 MG tablet Commonly known as:  NORVASC Take 1 tablet (5 mg total) by mouth daily.   CALTRATE PLUS PO Take 1 tablet by mouth daily.   CENTRUM SILVER PO Take 1 tablet by mouth daily.   FIBER CHOICE FRUITY BITES 1.5 g Chew Generic drug:  Inulin Chew 5 tablets by mouth daily. FIBER CHOICE   Fish Oil 1000 MG Caps Take 1 capsule by mouth 3 (three) times daily.   hydrALAZINE 10 MG tablet Commonly known as:  APRESOLINE Take 1 tablet (10 mg total) by mouth every 8 (eight) hours.   INTEGRA 62.5-62.5-40-3 MG Caps TAKE 1 CAPSULE BY MOUTH DAILY.   metoCLOPramide 10 MG tablet Commonly known as:  REGLAN TAKE 1 TABLET BY MOUTH AT BEDTIME   mirabegron ER 50 MG Tb24 tablet Commonly known as:  MYRBETRIQ Take 1 tablet (50 mg total) by mouth daily.   omeprazole 20 MG capsule Commonly known as:  PRILOSEC Take 1-2 capsules (20-40 mg total) by mouth daily. What changed:  additional instructions   rosuvastatin 5 MG tablet Commonly known as:  CRESTOR TAKE 1 TABLET ON MON, WED, FRIDAY   SB LOW DOSE ASA EC 81 MG EC tablet Generic drug:  aspirin Take 81 mg by mouth daily.   SENNA PLUS PO Take 1 tablet by mouth daily as needed (FOR CONSTIPATION).   SYSTANE OP Apply 1-2 drops to eye daily as needed (FOR DRY EYE).   Vitamin D 2000 units Caps Take by mouth as directed. Take 1 tablet daily except none on saturdays and sundays       Procedures/Studies: Dg Chest 2 View  Result Date: 10/26/2017 CLINICAL DATA:  Sudden onset of leg weakness. EXAM: CHEST  2 VIEW COMPARISON:  Radiographs 11/02/2015  FINDINGS: The cardiomediastinal contours are normal. Mild central bronchial thickening and perihilar atelectasis. Pulmonary vasculature is normal. No consolidation, pleural effusion, or pneumothorax. No acute osseous abnormalities are seen.  IMPRESSION: Mild central bronchial thickening and perihilar atelectasis, may be bronchitis or reactive airways disease. Electronically Signed   By: Jeb Levering M.D.   On: 10/26/2017 22:32   Ct Head Wo Contrast  Result Date: 10/26/2017 CLINICAL DATA:  Status post fall. Hit forehead against wall. Forehead bruising. EXAM: CT HEAD WITHOUT CONTRAST TECHNIQUE: Contiguous axial images were obtained from the base of the skull through the vertex without intravenous contrast. COMPARISON:  None. FINDINGS: Brain: No evidence of acute infarction, hemorrhage, hydrocephalus, extra-axial collection or mass lesion/mass effect. Prominence of the ventricles and sulci reflects moderately severe cortical volume loss. Cerebellar atrophy is noted. Scattered periventricular and subcortical white matter change likely reflects small vessel ischemic microangiopathy. The brainstem and fourth ventricle are within normal limits. The basal ganglia are unremarkable in appearance. The cerebral hemispheres demonstrate grossly normal gray-white differentiation. No mass effect or midline shift is seen. Vascular: No hyperdense vessel or unexpected calcification. Skull: There is no evidence of fracture; visualized osseous structures are unremarkable in appearance. Sinuses/Orbits: The visualized portions of the orbits are within normal limits. The paranasal sinuses and mastoid air cells are well-aerated. Other: Soft tissue swelling is noted at the anterior vertex. An adjacent subcutaneous soft tissue nodule is noted. IMPRESSION: 1. No evidence of traumatic intracranial injury or fracture. 2. Soft tissue swelling at the anterior vertex. 3. Moderately severe cortical volume loss and scattered small vessel  ischemic microangiopathy. Electronically Signed   By: Garald Balding M.D.   On: 10/26/2017 22:12   Ct Cervical Spine Wo Contrast  Result Date: 10/26/2017 CLINICAL DATA:  Status post fall, with concern for cervical spine injury. Leg weakness. EXAM: CT CERVICAL SPINE WITHOUT CONTRAST TECHNIQUE: Multidetector CT imaging of the cervical spine was performed without intravenous contrast. Multiplanar CT image reconstructions were also generated. COMPARISON:  None. FINDINGS: Alignment: Normal. Skull base and vertebrae: No acute fracture. No primary bone lesion or focal pathologic process. Soft tissues and spinal canal: No prevertebral fluid or swelling. No visible canal hematoma. Disc levels: Diffuse multilevel disc space narrowing is noted along the cervical spine, with scattered small anterior and posterior disc osteophyte complexes. Upper chest: A 1.3 cm hypodensity at the right thyroid lobe is likely benign, given its size. The visualized lung apices are clear. Mild calcification is seen at the carotid bifurcations bilaterally. Other: There is diffuse cerebellar atrophy. IMPRESSION: 1. No evidence of fracture or subluxation along the cervical spine. 2. Mild diffuse degenerative change along the cervical spine. 3. Mild calcification at the carotid bifurcations bilaterally. 4. Cerebellar atrophy noted. Electronically Signed   By: Garald Balding M.D.   On: 10/26/2017 23:58   Ct Lumbar Spine Wo Contrast  Result Date: 10/27/2017 CLINICAL DATA:  Initial evaluation for recent fall, leg weakness. EXAM: CT LUMBAR SPINE WITHOUT CONTRAST TECHNIQUE: Multidetector CT imaging of the lumbar spine was performed without intravenous contrast administration. Multiplanar CT image reconstructions were also generated. COMPARISON:  Prior radiograph from 05/04/2016. FINDINGS: Segmentation: Normal segmentation. Lowest well-formed disc labeled the L5-S1 level. Alignment: Levoscoliosis with apex at L2-3. Mild straightening of the normal  lumbar lordosis. No listhesis. Vertebrae: Vertebral body heights maintained without evidence for acute or chronic fracture. No discrete lytic or blastic osseous lesions. Visualized sacrum and bony pelvis intact. SI joints approximated and symmetric. Paraspinal and other soft tissues: Paraspinous soft tissues demonstrate no acute abnormality. Aortic atherosclerosis noted. Partially visualized visceral structures within normal limits. Disc levels: L1-2: Degenerative intervertebral disc space narrowing with disc desiccation. Diffuse disc bulge, eccentric to the  left. No canal or foraminal stenosis. L2-3: Chronic intervertebral disc space narrowing with disc desiccation and diffuse disc bulge. Right far lateral/extraforaminal reactive endplate changes with marginal endplate osteophytosis. No canal stenosis. Resultant mild to moderate right L2 foraminal narrowing with probable mild right lateral recess stenosis. Central canal widely patent. No left foraminal encroachment. L3-4: Chronic intervertebral disc space narrowing with diffuse disc bulge and disc desiccation. Right far lateral/extraforaminal reactive endplate changes. Moderate facet hypertrophy, right greater than left. Moderate right L3 foraminal narrowing. No significant canal or left foraminal stenosis. L4-5: Diffuse disc bulge with disc desiccation and intervertebral disc space narrowing. Reactive endplate changes, most notable at the left far lateral position. Superimposed central disc protrusion indents the ventral thecal sac (series 5, image 76). Mild to moderate left lateral recess stenosis. Moderate left L4 foraminal narrowing. Right neural foramen remains widely patent. L5-S1: Chronic intervertebral disc space narrowing with diffuse disc bulge and disc desiccation. Circumferential reactive endplate changes, most prominent at the left far lateral position. Moderate facet arthrosis, slightly worse on the left. Mild bilateral lateral recess stenosis without  significant canal narrowing. Severe left with mild right L5 foraminal stenosis. IMPRESSION: 1. No acute fracture or other abnormality within the lumbar spine. 2. Levoscoliosis with multilevel degenerative spondylolysis as above. Resultant mild to moderate lateral recess narrowing on the right and L2-3, on the left at L4-5, and bilaterally at L5-S1. 3. Multifactorial degenerative changes with resultant multilevel foraminal narrowing as above. Notable findings include moderate right L2 and L3 foraminal stenosis, moderate left L4 foraminal stenosis, with severe left L5 foraminal narrowing. Electronically Signed   By: Jeannine Boga M.D.   On: 10/27/2017 00:10     Subjective: Pt sitting up in chair, eating and drinking well, no complaints.   Discharge Exam: Vitals:   10/27/17 2050 10/28/17 0500  BP: (!) 145/76 (!) 155/77  Pulse: 92 86  Resp: 19 18  Temp: 98.2 F (36.8 C) 98.3 F (36.8 C)  SpO2: 92% 95%   Vitals:   10/27/17 1702 10/27/17 1937 10/27/17 2050 10/28/17 0500  BP:   (!) 145/76 (!) 155/77  Pulse:   92 86  Resp:   19 18  Temp:   98.2 F (36.8 C) 98.3 F (36.8 C)  TempSrc:   Oral Oral  SpO2: 95% 96% 92% 95%  Weight:      Height:        General: Pt is alert, awake, not in acute distress Cardiovascular: RRR, S1/S2 +, no rubs, no gallops Respiratory: CTA bilaterally, no wheezing, no rhonchi Abdominal: Soft, NT, ND, bowel sounds + Extremities: no edema, no cyanosis   The results of significant diagnostics from this hospitalization (including imaging, microbiology, ancillary and laboratory) are listed below for reference.     Microbiology: Recent Results (from the past 240 hour(s))  Urine culture     Status: None   Collection Time: 10/26/17  9:35 PM  Result Value Ref Range Status   Specimen Description URINE, CLEAN CATCH  Final   Special Requests NONE  Final   Culture   Final    NO GROWTH Performed at [facility], [address], [place], [place]  [address]    Report Status 10/28/2017 FINAL  Final     Labs: BNP (last 3 results) No results for input(s): BNP in the last 8760 hours. Basic Metabolic Panel: Recent Labs  Lab 10/26/17 2049 10/27/17 0609  NA 131* 136  K 3.9 3.6  CL 96* 102  CO2 26 26  GLUCOSE 132* 110*  BUN 16 11  CREATININE 0.74 0.53  CALCIUM 9.5 9.2   Liver Function Tests: Recent Labs  Lab 10/27/17 0609  AST 21  ALT 17  ALKPHOS 52  BILITOT 0.6  PROT 6.3*  ALBUMIN 3.2*   No results for input(s): LIPASE, AMYLASE in the last 168 hours. No results for input(s): AMMONIA in the last 168 hours. CBC: Recent Labs  Lab 10/26/17 2049 10/26/17 2122 10/27/17 0609  WBC 13.3*  --  11.5*  NEUTROABS  --  11.0*  --   HGB 12.8  --  13.3  HCT 38.9  --  39.8  MCV 92.4  --  91.9  PLT 283  --  290   Cardiac Enzymes: Recent Labs  Lab 10/26/17 2122  CKTOTAL 101  TROPONINI <0.03   BNP: Invalid input(s): POCBNP CBG: No results for input(s): GLUCAP in the last 168 hours. D-Dimer No results for input(s): DDIMER in the last 72 hours. Hgb A1c No results for input(s): HGBA1C in the last 72 hours. Lipid Profile No results for input(s): CHOL, HDL, LDLCALC, TRIG, CHOLHDL, LDLDIRECT in the last 72 hours. Thyroid function studies No results for input(s): TSH, T4TOTAL, T3FREE, THYROIDAB in the last 72 hours.  Invalid input(s): FREET3 Anemia work up No results for input(s): VITAMINB12, FOLATE, FERRITIN, TIBC, IRON, RETICCTPCT in the last 72 hours. Urinalysis    Component Value Date/Time   COLORURINE YELLOW 10/26/2017 2135   APPEARANCEUR CLEAR 10/26/2017 2135   LABSPEC 1.009 10/26/2017 2135   PHURINE 7.0 10/26/2017 2135   GLUCOSEU NEGATIVE 10/26/2017 2135   HGBUR NEGATIVE 10/26/2017 2135   BILIRUBINUR NEGATIVE 10/26/2017 2135   BILIRUBINUR neg 07/07/2014 1033   KETONESUR 5 (A) 10/26/2017 2135   PROTEINUR 30 (A) 10/26/2017 2135   UROBILINOGEN negative 07/07/2014 1033   NITRITE NEGATIVE 10/26/2017 2135    LEUKOCYTESUR NEGATIVE 10/26/2017 2135   Sepsis Labs Invalid input(s): PROCALCITONIN,  WBC,  LACTICIDVEN Microbiology Recent Results (from the past 240 hour(s))  Urine culture     Status: None   Collection Time: 10/26/17  9:35 PM  Result Value Ref Range Status   Specimen Description URINE, CLEAN CATCH  Final   Special Requests NONE  Final   Culture   Final    NO GROWTH Performed at [facility], [place] [address], [place], [place] [address]    Report Status 10/28/2017 FINAL  Final   Time coordinating discharge:   SIGNED:  Irwin Brakeman, MD  [facility] 10/28/2017, 10:42 AM Pager [phone]  If 7PM-7AM, please contact night-coverage [email or web address] Password [email or web address]

## 2017-10-28 NOTE — Progress Notes (Signed)
AVS printed and discussed.  IV removed.  Site clean dry and intact.  Pt taken down by this RN.  Pt stable upon discharge.

## 2017-10-29 DIAGNOSIS — Z7982 Long term (current) use of aspirin: Secondary | ICD-10-CM | POA: Diagnosis not present

## 2017-10-29 DIAGNOSIS — M810 Age-related osteoporosis without current pathological fracture: Secondary | ICD-10-CM | POA: Diagnosis not present

## 2017-10-29 DIAGNOSIS — Z9181 History of falling: Secondary | ICD-10-CM | POA: Diagnosis not present

## 2017-10-29 DIAGNOSIS — D649 Anemia, unspecified: Secondary | ICD-10-CM | POA: Diagnosis not present

## 2017-10-29 DIAGNOSIS — M6281 Muscle weakness (generalized): Secondary | ICD-10-CM | POA: Diagnosis not present

## 2017-10-29 DIAGNOSIS — K219 Gastro-esophageal reflux disease without esophagitis: Secondary | ICD-10-CM | POA: Diagnosis not present

## 2017-10-29 DIAGNOSIS — G629 Polyneuropathy, unspecified: Secondary | ICD-10-CM | POA: Diagnosis not present

## 2017-10-29 DIAGNOSIS — E785 Hyperlipidemia, unspecified: Secondary | ICD-10-CM | POA: Diagnosis not present

## 2017-10-29 DIAGNOSIS — I10 Essential (primary) hypertension: Secondary | ICD-10-CM | POA: Diagnosis not present

## 2017-10-29 DIAGNOSIS — K900 Celiac disease: Secondary | ICD-10-CM | POA: Diagnosis not present

## 2017-10-29 DIAGNOSIS — F419 Anxiety disorder, unspecified: Secondary | ICD-10-CM | POA: Diagnosis not present

## 2017-10-31 DIAGNOSIS — M810 Age-related osteoporosis without current pathological fracture: Secondary | ICD-10-CM | POA: Diagnosis not present

## 2017-10-31 DIAGNOSIS — F419 Anxiety disorder, unspecified: Secondary | ICD-10-CM | POA: Diagnosis not present

## 2017-10-31 DIAGNOSIS — I10 Essential (primary) hypertension: Secondary | ICD-10-CM | POA: Diagnosis not present

## 2017-10-31 DIAGNOSIS — G629 Polyneuropathy, unspecified: Secondary | ICD-10-CM | POA: Diagnosis not present

## 2017-10-31 DIAGNOSIS — M6281 Muscle weakness (generalized): Secondary | ICD-10-CM | POA: Diagnosis not present

## 2017-10-31 DIAGNOSIS — D649 Anemia, unspecified: Secondary | ICD-10-CM | POA: Diagnosis not present

## 2017-11-01 DIAGNOSIS — F419 Anxiety disorder, unspecified: Secondary | ICD-10-CM | POA: Diagnosis not present

## 2017-11-01 DIAGNOSIS — M810 Age-related osteoporosis without current pathological fracture: Secondary | ICD-10-CM | POA: Diagnosis not present

## 2017-11-01 DIAGNOSIS — G629 Polyneuropathy, unspecified: Secondary | ICD-10-CM | POA: Diagnosis not present

## 2017-11-01 DIAGNOSIS — M6281 Muscle weakness (generalized): Secondary | ICD-10-CM | POA: Diagnosis not present

## 2017-11-01 DIAGNOSIS — I10 Essential (primary) hypertension: Secondary | ICD-10-CM | POA: Diagnosis not present

## 2017-11-01 DIAGNOSIS — D649 Anemia, unspecified: Secondary | ICD-10-CM | POA: Diagnosis not present

## 2017-11-02 ENCOUNTER — Encounter: Payer: Self-pay | Admitting: Family Medicine

## 2017-11-02 ENCOUNTER — Ambulatory Visit (INDEPENDENT_AMBULATORY_CARE_PROVIDER_SITE_OTHER): Payer: Medicare Other | Admitting: Family Medicine

## 2017-11-02 VITALS — BP 151/91 | HR 89 | Temp 98.4°F | Ht 60.0 in | Wt 101.0 lb

## 2017-11-02 DIAGNOSIS — M6281 Muscle weakness (generalized): Secondary | ICD-10-CM | POA: Diagnosis not present

## 2017-11-02 DIAGNOSIS — I10 Essential (primary) hypertension: Secondary | ICD-10-CM

## 2017-11-02 DIAGNOSIS — E860 Dehydration: Secondary | ICD-10-CM | POA: Diagnosis not present

## 2017-11-02 DIAGNOSIS — D649 Anemia, unspecified: Secondary | ICD-10-CM | POA: Diagnosis not present

## 2017-11-02 DIAGNOSIS — R531 Weakness: Secondary | ICD-10-CM | POA: Diagnosis not present

## 2017-11-02 DIAGNOSIS — M810 Age-related osteoporosis without current pathological fracture: Secondary | ICD-10-CM | POA: Diagnosis not present

## 2017-11-02 DIAGNOSIS — G629 Polyneuropathy, unspecified: Secondary | ICD-10-CM | POA: Diagnosis not present

## 2017-11-02 DIAGNOSIS — F419 Anxiety disorder, unspecified: Secondary | ICD-10-CM | POA: Diagnosis not present

## 2017-11-02 NOTE — Progress Notes (Signed)
BP (!) 151/91   Pulse 89   Temp 98.4 F (36.9 C) (Oral)   Ht 5' (1.524 m)   Wt 101 lb (45.8 kg)   BMI 19.73 kg/m    Subjective:    Patient ID: Kristi Sawyer, female    DOB: 12/25/1925, 83 y.o.   MRN: [id number]  HPI: Kristi Sawyer is a 83 y.o. female presenting on 11/02/2017 for Hospitalization Follow-up   HPI Weakness and dehydration and elevated blood pressure Patient is coming in for hospital follow-up and was admitted to the hospital on 10/26/2017 and discharged on 10/28/2017.  The patient then went to SNF placement and was recommended follow-up after SNF discharge.  During the hospitalization patient was also found to have very elevated blood pressure and started on a medication for this.  Patient was found on the 14th to be generally weak and she was in her bathtub and she could not get out and articular 30 minutes to get out.  She had a life alert and pushed to the emergency and was found in that state.  She denied any focal numbness or weakness on one side versus the other but just generally felt weak.  Since the hospitalization she has been improving but still feels weak to some extent.  She did some rehab at the extended care facility.  She continues to work with rehab outpatient trying to get her strength back.  She denies any visual disturbances or lightheadedness  Relevant past medical, surgical, family and social history reviewed and updated as indicated. Interim medical history since our last visit reviewed. Allergies and medications reviewed and updated.  Review of Systems  Constitutional: Positive for fatigue. Negative for chills and fever.  Eyes: Negative for visual disturbance.  Respiratory: Negative for chest tightness and shortness of breath.   Cardiovascular: Negative for chest pain and leg swelling.  Musculoskeletal: Negative for back pain and gait problem.  Skin: Negative for rash.  Neurological: Positive for weakness. Negative for dizziness, light-headedness,  numbness and headaches.  Psychiatric/Behavioral: Negative for agitation and behavioral problems.  All other systems reviewed and are negative.   Per HPI unless specifically indicated above        Objective:    BP (!) 151/91   Pulse 89   Temp 98.4 F (36.9 C) (Oral)   Ht 5' (1.524 m)   Wt 101 lb (45.8 kg)   BMI 19.73 kg/m   Wt Readings from Last 3 Encounters:  11/02/17 101 lb (45.8 kg)  10/27/17 101 lb 6.6 oz (46 kg)  08/07/17 101 lb (45.8 kg)    Physical Exam  Constitutional: She is oriented to person, place, and time. She appears well-developed and well-nourished. No distress.  Eyes: Conjunctivae are normal.  Neck: Neck supple. No thyromegaly present.  Cardiovascular: Normal rate, regular rhythm, normal heart sounds and intact distal pulses.  No murmur heard. Pulmonary/Chest: Effort normal and breath sounds normal. No respiratory distress. She has no wheezes. She has no rales.  Musculoskeletal: Normal range of motion. She exhibits no edema or tenderness.  4 out of 5 strength in all 4 extremities  Lymphadenopathy:    She has no cervical adenopathy.  Neurological: She is alert and oriented to person, place, and time. No cranial nerve deficit or sensory deficit. Coordination normal.  Skin: Skin is warm and dry. No rash noted. She is not diaphoretic.  Psychiatric: She has a normal mood and affect. Her behavior is normal.  Nursing note and vitals reviewed.  Assessment & Plan:   Problem List Items Addressed This Visit      Cardiovascular and Mediastinum   Uncontrolled hypertension   Relevant Orders   BMP8+EGFR (Completed)   CBC with Differential/Platelet (Completed)    Other Visit Diagnoses    Weakness    -  Primary   Relevant Orders   BMP8+EGFR (Completed)   CBC with Differential/Platelet (Completed)   Dehydration       Relevant Orders   BMP8+EGFR (Completed)   CBC with Differential/Platelet (Completed)       Follow up plan: Return if symptoms  worsen or fail to improve.  Counseling provided for all of the vaccine components Orders Placed This Encounter  Procedures  . BMP8+EGFR  . CBC with Differential/Platelet    [name], MD [facility] 11/02/2017, 9:39 AM

## 2017-11-03 LAB — CBC WITH DIFFERENTIAL/PLATELET
Basophils Absolute: 0 10*3/uL (ref 0.0–0.2)
Basos: 0 %
EOS (ABSOLUTE): 0 10*3/uL (ref 0.0–0.4)
EOS: 0 %
HEMATOCRIT: 37.1 % (ref 34.0–46.6)
HEMOGLOBIN: 12.9 g/dL (ref 11.1–15.9)
IMMATURE GRANS (ABS): 0.1 10*3/uL (ref 0.0–0.1)
IMMATURE GRANULOCYTES: 1 %
LYMPHS ABS: 2.2 10*3/uL (ref 0.7–3.1)
LYMPHS: 21 %
MCH: 31.2 pg (ref 26.6–33.0)
MCHC: 34.8 g/dL (ref 31.5–35.7)
MCV: 90 fL (ref 79–97)
MONOCYTES: 14 %
Monocytes Absolute: 1.5 10*3/uL — ABNORMAL HIGH (ref 0.1–0.9)
NEUTROS PCT: 64 %
Neutrophils Absolute: 6.7 10*3/uL (ref 1.4–7.0)
Platelets: 321 10*3/uL (ref 150–379)
RBC: 4.14 x10E6/uL (ref 3.77–5.28)
RDW: 13.2 % (ref 12.3–15.4)
WBC: 10.6 10*3/uL (ref 3.4–10.8)

## 2017-11-03 LAB — BMP8+EGFR
BUN/Creatinine Ratio: 21 (ref 12–28)
BUN: 13 mg/dL (ref 8–27)
CO2: 22 mmol/L (ref 20–29)
CREATININE: 0.61 mg/dL (ref 0.57–1.00)
Calcium: 10 mg/dL (ref 8.7–10.3)
Chloride: 99 mmol/L (ref 96–106)
GFR calc Af Amer: 93 mL/min/{1.73_m2} (ref >59)
GFR calc non Af Amer: 81 mL/min/{1.73_m2} (ref >59)
GLUCOSE: 94 mg/dL (ref 65–99)
Potassium: 4.7 mmol/L (ref 3.5–5.2)
Sodium: 139 mmol/L (ref 134–144)

## 2017-11-04 DIAGNOSIS — G629 Polyneuropathy, unspecified: Secondary | ICD-10-CM | POA: Diagnosis not present

## 2017-11-04 DIAGNOSIS — F419 Anxiety disorder, unspecified: Secondary | ICD-10-CM | POA: Diagnosis not present

## 2017-11-04 DIAGNOSIS — M810 Age-related osteoporosis without current pathological fracture: Secondary | ICD-10-CM | POA: Diagnosis not present

## 2017-11-04 DIAGNOSIS — D649 Anemia, unspecified: Secondary | ICD-10-CM | POA: Diagnosis not present

## 2017-11-04 DIAGNOSIS — M6281 Muscle weakness (generalized): Secondary | ICD-10-CM | POA: Diagnosis not present

## 2017-11-04 DIAGNOSIS — I10 Essential (primary) hypertension: Secondary | ICD-10-CM | POA: Diagnosis not present

## 2017-11-05 DIAGNOSIS — F419 Anxiety disorder, unspecified: Secondary | ICD-10-CM | POA: Diagnosis not present

## 2017-11-05 DIAGNOSIS — I10 Essential (primary) hypertension: Secondary | ICD-10-CM | POA: Diagnosis not present

## 2017-11-05 DIAGNOSIS — M6281 Muscle weakness (generalized): Secondary | ICD-10-CM | POA: Diagnosis not present

## 2017-11-05 DIAGNOSIS — M810 Age-related osteoporosis without current pathological fracture: Secondary | ICD-10-CM | POA: Diagnosis not present

## 2017-11-05 DIAGNOSIS — D649 Anemia, unspecified: Secondary | ICD-10-CM | POA: Diagnosis not present

## 2017-11-05 DIAGNOSIS — G629 Polyneuropathy, unspecified: Secondary | ICD-10-CM | POA: Diagnosis not present

## 2017-11-07 DIAGNOSIS — D649 Anemia, unspecified: Secondary | ICD-10-CM | POA: Diagnosis not present

## 2017-11-07 DIAGNOSIS — G629 Polyneuropathy, unspecified: Secondary | ICD-10-CM | POA: Diagnosis not present

## 2017-11-07 DIAGNOSIS — F419 Anxiety disorder, unspecified: Secondary | ICD-10-CM | POA: Diagnosis not present

## 2017-11-07 DIAGNOSIS — I10 Essential (primary) hypertension: Secondary | ICD-10-CM | POA: Diagnosis not present

## 2017-11-07 DIAGNOSIS — M810 Age-related osteoporosis without current pathological fracture: Secondary | ICD-10-CM | POA: Diagnosis not present

## 2017-11-07 DIAGNOSIS — M6281 Muscle weakness (generalized): Secondary | ICD-10-CM | POA: Diagnosis not present

## 2017-11-08 DIAGNOSIS — M810 Age-related osteoporosis without current pathological fracture: Secondary | ICD-10-CM | POA: Diagnosis not present

## 2017-11-08 DIAGNOSIS — G629 Polyneuropathy, unspecified: Secondary | ICD-10-CM | POA: Diagnosis not present

## 2017-11-08 DIAGNOSIS — D649 Anemia, unspecified: Secondary | ICD-10-CM | POA: Diagnosis not present

## 2017-11-08 DIAGNOSIS — M6281 Muscle weakness (generalized): Secondary | ICD-10-CM | POA: Diagnosis not present

## 2017-11-08 DIAGNOSIS — F419 Anxiety disorder, unspecified: Secondary | ICD-10-CM | POA: Diagnosis not present

## 2017-11-08 DIAGNOSIS — I10 Essential (primary) hypertension: Secondary | ICD-10-CM | POA: Diagnosis not present

## 2017-11-09 DIAGNOSIS — I10 Essential (primary) hypertension: Secondary | ICD-10-CM | POA: Diagnosis not present

## 2017-11-09 DIAGNOSIS — M810 Age-related osteoporosis without current pathological fracture: Secondary | ICD-10-CM | POA: Diagnosis not present

## 2017-11-09 DIAGNOSIS — D649 Anemia, unspecified: Secondary | ICD-10-CM | POA: Diagnosis not present

## 2017-11-09 DIAGNOSIS — F419 Anxiety disorder, unspecified: Secondary | ICD-10-CM | POA: Diagnosis not present

## 2017-11-09 DIAGNOSIS — M6281 Muscle weakness (generalized): Secondary | ICD-10-CM | POA: Diagnosis not present

## 2017-11-09 DIAGNOSIS — G629 Polyneuropathy, unspecified: Secondary | ICD-10-CM | POA: Diagnosis not present

## 2017-11-12 DIAGNOSIS — M810 Age-related osteoporosis without current pathological fracture: Secondary | ICD-10-CM | POA: Diagnosis not present

## 2017-11-12 DIAGNOSIS — M6281 Muscle weakness (generalized): Secondary | ICD-10-CM | POA: Diagnosis not present

## 2017-11-12 DIAGNOSIS — I10 Essential (primary) hypertension: Secondary | ICD-10-CM | POA: Diagnosis not present

## 2017-11-12 DIAGNOSIS — D649 Anemia, unspecified: Secondary | ICD-10-CM | POA: Diagnosis not present

## 2017-11-12 DIAGNOSIS — G629 Polyneuropathy, unspecified: Secondary | ICD-10-CM | POA: Diagnosis not present

## 2017-11-12 DIAGNOSIS — F419 Anxiety disorder, unspecified: Secondary | ICD-10-CM | POA: Diagnosis not present

## 2017-11-14 DIAGNOSIS — I10 Essential (primary) hypertension: Secondary | ICD-10-CM | POA: Diagnosis not present

## 2017-11-14 DIAGNOSIS — D649 Anemia, unspecified: Secondary | ICD-10-CM | POA: Diagnosis not present

## 2017-11-14 DIAGNOSIS — F419 Anxiety disorder, unspecified: Secondary | ICD-10-CM | POA: Diagnosis not present

## 2017-11-14 DIAGNOSIS — M6281 Muscle weakness (generalized): Secondary | ICD-10-CM | POA: Diagnosis not present

## 2017-11-14 DIAGNOSIS — M810 Age-related osteoporosis without current pathological fracture: Secondary | ICD-10-CM | POA: Diagnosis not present

## 2017-11-14 DIAGNOSIS — G629 Polyneuropathy, unspecified: Secondary | ICD-10-CM | POA: Diagnosis not present

## 2017-11-16 DIAGNOSIS — F419 Anxiety disorder, unspecified: Secondary | ICD-10-CM | POA: Diagnosis not present

## 2017-11-16 DIAGNOSIS — M810 Age-related osteoporosis without current pathological fracture: Secondary | ICD-10-CM | POA: Diagnosis not present

## 2017-11-16 DIAGNOSIS — G629 Polyneuropathy, unspecified: Secondary | ICD-10-CM | POA: Diagnosis not present

## 2017-11-16 DIAGNOSIS — D649 Anemia, unspecified: Secondary | ICD-10-CM | POA: Diagnosis not present

## 2017-11-16 DIAGNOSIS — M6281 Muscle weakness (generalized): Secondary | ICD-10-CM | POA: Diagnosis not present

## 2017-11-16 DIAGNOSIS — I10 Essential (primary) hypertension: Secondary | ICD-10-CM | POA: Diagnosis not present

## 2017-11-20 DIAGNOSIS — M810 Age-related osteoporosis without current pathological fracture: Secondary | ICD-10-CM | POA: Diagnosis not present

## 2017-11-20 DIAGNOSIS — M6281 Muscle weakness (generalized): Secondary | ICD-10-CM | POA: Diagnosis not present

## 2017-11-20 DIAGNOSIS — G629 Polyneuropathy, unspecified: Secondary | ICD-10-CM | POA: Diagnosis not present

## 2017-11-20 DIAGNOSIS — F419 Anxiety disorder, unspecified: Secondary | ICD-10-CM | POA: Diagnosis not present

## 2017-11-20 DIAGNOSIS — D649 Anemia, unspecified: Secondary | ICD-10-CM | POA: Diagnosis not present

## 2017-11-20 DIAGNOSIS — I10 Essential (primary) hypertension: Secondary | ICD-10-CM | POA: Diagnosis not present

## 2017-11-21 DIAGNOSIS — I10 Essential (primary) hypertension: Secondary | ICD-10-CM | POA: Diagnosis not present

## 2017-11-21 DIAGNOSIS — G629 Polyneuropathy, unspecified: Secondary | ICD-10-CM | POA: Diagnosis not present

## 2017-11-21 DIAGNOSIS — M810 Age-related osteoporosis without current pathological fracture: Secondary | ICD-10-CM | POA: Diagnosis not present

## 2017-11-21 DIAGNOSIS — F419 Anxiety disorder, unspecified: Secondary | ICD-10-CM | POA: Diagnosis not present

## 2017-11-21 DIAGNOSIS — D649 Anemia, unspecified: Secondary | ICD-10-CM | POA: Diagnosis not present

## 2017-11-21 DIAGNOSIS — M6281 Muscle weakness (generalized): Secondary | ICD-10-CM | POA: Diagnosis not present

## 2017-11-22 ENCOUNTER — Ambulatory Visit (INDEPENDENT_AMBULATORY_CARE_PROVIDER_SITE_OTHER): Payer: Medicare Other

## 2017-11-22 DIAGNOSIS — G629 Polyneuropathy, unspecified: Secondary | ICD-10-CM | POA: Diagnosis not present

## 2017-11-22 DIAGNOSIS — I10 Essential (primary) hypertension: Secondary | ICD-10-CM | POA: Diagnosis not present

## 2017-11-22 DIAGNOSIS — D649 Anemia, unspecified: Secondary | ICD-10-CM

## 2017-11-22 DIAGNOSIS — M6281 Muscle weakness (generalized): Secondary | ICD-10-CM | POA: Diagnosis not present

## 2017-11-27 DIAGNOSIS — H35372 Puckering of macula, left eye: Secondary | ICD-10-CM | POA: Diagnosis not present

## 2017-11-27 DIAGNOSIS — H43812 Vitreous degeneration, left eye: Secondary | ICD-10-CM | POA: Diagnosis not present

## 2017-11-27 DIAGNOSIS — H35371 Puckering of macula, right eye: Secondary | ICD-10-CM | POA: Diagnosis not present

## 2017-11-28 ENCOUNTER — Other Ambulatory Visit: Payer: Self-pay

## 2017-11-28 ENCOUNTER — Other Ambulatory Visit: Payer: Self-pay | Admitting: *Deleted

## 2017-11-28 ENCOUNTER — Telehealth: Payer: Self-pay

## 2017-11-28 MED ORDER — AMLODIPINE BESYLATE 5 MG PO TABS: 5.0000 mg | ORAL_TABLET | Freq: Every day | ORAL | 1 refills | Status: DC

## 2017-11-28 NOTE — Telephone Encounter (Signed)
[facility] has sent over request for 90 script on amlodipine 84m . Pt last office visit was 11-02-2017. Thanks [employer]

## 2017-11-28 NOTE — Telephone Encounter (Signed)
This not our patient

## 2017-12-10 ENCOUNTER — Other Ambulatory Visit: Payer: Self-pay

## 2017-12-10 MED ORDER — HYDRALAZINE HCL 10 MG PO TABS: 10.0000 mg | ORAL_TABLET | Freq: Three times a day (TID) | ORAL | 0 refills | Status: DC

## 2017-12-11 DIAGNOSIS — N3946 Mixed incontinence: Secondary | ICD-10-CM | POA: Diagnosis not present

## 2017-12-11 DIAGNOSIS — R351 Nocturia: Secondary | ICD-10-CM | POA: Diagnosis not present

## 2017-12-11 DIAGNOSIS — R350 Frequency of micturition: Secondary | ICD-10-CM | POA: Diagnosis not present

## 2017-12-26 ENCOUNTER — Encounter: Payer: Self-pay | Admitting: Family Medicine

## 2017-12-26 ENCOUNTER — Ambulatory Visit (INDEPENDENT_AMBULATORY_CARE_PROVIDER_SITE_OTHER): Payer: Medicare Other | Admitting: Family Medicine

## 2017-12-26 ENCOUNTER — Telehealth: Payer: Self-pay | Admitting: Family Medicine

## 2017-12-26 VITALS — BP 126/71 | HR 79 | Temp 96.7°F | Ht 60.0 in | Wt 103.0 lb

## 2017-12-26 DIAGNOSIS — R531 Weakness: Secondary | ICD-10-CM

## 2017-12-26 DIAGNOSIS — R2681 Unsteadiness on feet: Secondary | ICD-10-CM | POA: Diagnosis not present

## 2017-12-26 DIAGNOSIS — K219 Gastro-esophageal reflux disease without esophagitis: Secondary | ICD-10-CM

## 2017-12-26 DIAGNOSIS — E7800 Pure hypercholesterolemia, unspecified: Secondary | ICD-10-CM | POA: Diagnosis not present

## 2017-12-26 DIAGNOSIS — E559 Vitamin D deficiency, unspecified: Secondary | ICD-10-CM | POA: Diagnosis not present

## 2017-12-26 NOTE — Patient Instructions (Addendum)
Medicare Annual Wellness Visit  [facility] and the medical providers at [facility] strive to bring you the best medical care.  In doing so we not only want to address your current medical conditions and concerns but also to detect new conditions early and prevent illness, disease and health-related problems.    Medicare offers a yearly Wellness Visit which allows our clinical staff to assess your need for preventative services including immunizations, lifestyle education, counseling to decrease risk of preventable diseases and screening for fall risk and other medical concerns.    This visit is provided free of charge (no copay) for all Medicare recipients. The clinical pharmacists at [facility] have begun to conduct these Wellness Visits which will also include a thorough review of all your medications.    As you primary medical provider recommend that you make an appointment for your Annual Wellness Visit if you have not done so already this year.  You may set up this appointment before you leave today or you may call back ([phone]) and schedule an appointment.  Please make sure when you call that you mention that you are scheduling your Annual Wellness Visit with the clinical pharmacist so that the appointment may be made for the proper length of time.     Continue current medications. Continue good therapeutic lifestyle changes which include good diet and exercise. Fall precautions discussed with patient. If an FOBT was given today- please return it to our front desk. If you are over 76 years old - you may need Prevnar 48 or the adult Pneumonia vaccine.  **Flu shots are available--- please call and schedule a FLU-CLINIC appointment**  After your visit with [place] today you will receive a survey in the mail or online from [employer] regarding your care with [place]. Please take a moment to fill this out. Your feedback is very  important to [place] as you can help [place] better understand your patient needs as well as improve your experience and satisfaction. WE CARE ABOUT YOU!!!   Continue to practice good hand and respiratory hygiene especially during the flu season Drink plenty of water and stay well-hydrated Arise slowly and stand at the location before you start walking Do not climb We will arrange for you to have physical therapy for gait strengthening and stability If possible and you do feel lightheaded in the future and you can get a blood pressure reading during this lightheadedness that would be great information for [place] to keep.

## 2017-12-26 NOTE — Progress Notes (Signed)
Subjective:    Patient ID: Kristi Sawyer, female    DOB: 07/06/1926, 84 y.o.   MRN: [id number]  HPI Pt here for follow up and management of chronic medical problems which includes hyperlipidemia. She is taking medication regularly.  The patient is doing better following her recent fall and admission to the hospital because of dehydration.  She is gaining her strength back slowly.  She still complains of being off balance at times.  She is due to get lab work today and we will consider doing a referral to physical therapy for gait strengthening and stability.  I wonder if some of this could be due to her antihypertension meds.  Her blood pressure today though is good at 126/71.  She brings in blood pressure readings from the outside and all of these are in the 120s over the 60-70 range with one being 85 for the diastolic.  These readings will be scanned into the record. They were taken by her next-door neighbor who is a Marine scientist.  The patient denies any chest pain shortness of breath trouble swallowing heartburn indigestion nausea vomiting diarrhea blood in the stool change in bowel habits or trouble passing her water other than frequency which she is always had.  She is trying to drink plenty of fluids.  She is trying to be more careful with arising from a sitting position and arising from a supine position.  She is no longer taking tub baths but only taking showers.  This may play a role with her syncopal spell which caused her to have to go to the hospital.  She is also trying to take better care of herself instead of looking after other people.     Patient Active Problem List   Diagnosis Date Noted  . Uncontrolled hypertension 10/28/2017  . Facial contusion, initial encounter   . Generalized weakness 10/26/2017  . Overactive bladder 06/18/2015  . Hyperlipidemia 09/26/2010  . ANXIETY 09/26/2010  . GERD (gastroesophageal reflux disease) 09/26/2010  . CELIAC SPRUE 09/26/2010  . Osteoporosis  09/26/2010   Outpatient Encounter Medications as of 12/26/2017  Medication Sig  . amLODipine (NORVASC) 5 MG tablet Take 1 tablet (5 mg total) by mouth daily.  Marland Kitchen aspirin (SB LOW DOSE ASA EC) 81 MG EC tablet Take 81 mg by mouth daily.    . Fe Fum-FePoly-Vit C-Vit B3 (INTEGRA) 62.5-62.5-40-3 MG CAPS TAKE 1 CAPSULE BY MOUTH DAILY.  . hydrALAZINE (APRESOLINE) 10 MG tablet Take 1 tablet (10 mg total) by mouth every 8 (eight) hours.  . Inulin (FIBER CHOICE FRUITY BITES) 1.5 g CHEW Chew 5 tablets by mouth daily. FIBER CHOICE  . metoCLOPramide (REGLAN) 10 MG tablet TAKE 1 TABLET BY MOUTH AT BEDTIME  . mirabegron ER (MYRBETRIQ) 50 MG TB24 tablet Take 1 tablet (50 mg total) by mouth daily.  . Multiple Vitamins-Minerals (CENTRUM SILVER PO) Take 1 tablet by mouth daily.   . Omega-3 Fatty Acids (FISH OIL) 1000 MG CAPS Take 1 capsule by mouth 3 (three) times daily.   Marland Kitchen omeprazole (PRILOSEC) 20 MG capsule Take 1-2 capsules (20-40 mg total) by mouth daily. (Patient taking differently: Take 20-40 mg by mouth daily. 12m in the morning and 472mat bedtime)  . Polyethyl Glycol-Propyl Glycol (SYSTANE OP) Apply 1-2 drops to eye daily as needed (FOR DRY EYE).   . rosuvastatin (CRESTOR) 5 MG tablet TAKE 1 TABLET ON MON, WED, FRIDAY  . Calcium Carbonate-Vit D-Min (CALTRATE PLUS PO) Take 1 tablet by mouth daily.   .Marland Kitchen  Cholecalciferol (VITAMIN D) 2000 UNITS CAPS Take by mouth as directed. Take 1 tablet daily except none on saturdays and sundays  . [DISCONTINUED] Sennosides-Docusate Sodium (SENNA PLUS PO) Take 1 tablet by mouth daily as needed (FOR CONSTIPATION).    No facility-administered encounter medications on file as of 12/26/2017.      Review of Systems  Constitutional: Negative.   HENT: Negative.   Eyes: Negative.   Respiratory: Negative.   Cardiovascular: Negative.   Gastrointestinal: Negative.   Endocrine: Negative.   Genitourinary: Negative.   Musculoskeletal: Negative.   Skin: Negative.     Allergic/Immunologic: Negative.   Neurological: Negative.   Hematological: Negative.   Psychiatric/Behavioral: Negative.        Objective:   Physical Exam  Constitutional: She is oriented to person, place, and time. She appears well-developed and well-nourished.  The patient is pleasant and looking more like herself since her hospitalization.  HENT:  Head: Normocephalic and atraumatic.  Right Ear: External ear normal.  Left Ear: External ear normal.  Nose: Nose normal.  Mouth/Throat: Oropharynx is clear and moist.  Eyes: Conjunctivae and EOM are normal. Pupils are equal, round, and reactive to light. Right eye exhibits no discharge. Left eye exhibits no discharge. No scleral icterus.  Neck: Normal range of motion. Neck supple. No thyromegaly present.  No bruits thyromegaly or anterior cervical adenopathy  Cardiovascular: Normal rate, regular rhythm, normal heart sounds and intact distal pulses.  No murmur heard. Heart has a regular rate and rhythm at 72/min  Pulmonary/Chest: Effort normal and breath sounds normal. No respiratory distress. She has no wheezes. She has no rales.  Clear anteriorly and posteriorly  Abdominal: Soft. Bowel sounds are normal. She exhibits no mass. There is no tenderness. There is no rebound and no guarding.  No abdominal tenderness masses organ enlargement or bruits and no inguinal adenopathy  Musculoskeletal: Normal range of motion. She exhibits no edema.  Range of motion is pretty normal slightly hesitant with moves and especially getting on the table getting off the table.  Lymphadenopathy:    She has no cervical adenopathy.  Neurological: She is alert and oriented to person, place, and time. She has normal reflexes. No cranial nerve deficit.  Skin: Skin is warm and dry. No rash noted.  Psychiatric: She has a normal mood and affect. Her behavior is normal. Judgment and thought content normal.  Nursing note and vitals reviewed.   BP 126/71 (BP  Location: Left Arm)   Pulse 79   Temp (!) 96.7 F (35.9 C) (Oral)   Ht 5' (1.524 m)   Wt 103 lb (46.7 kg)   BMI 20.12 kg/m       Assessment & Plan:  1. Pure hypercholesterolemia -Continue with current treatment pending results of lab work - BMP8+EGFR - CBC with Differential/Platelet - Lipid panel  2. Vitamin D deficiency -Continue with vitamin D replacement and calcium pending results of lab work - CBC with Differential/Platelet - VITAMIN D 25 Hydroxy (Vit-D Deficiency, Fractures)  3. Gastroesophageal reflux disease, esophagitis presence not specified -Continue with omeprazole - CBC with Differential/Platelet - Hepatic function panel  4. Gait instability - Ambulatory referral to Physical Therapy  5. General weakness -She is reminded to move slowly stand up slowly and stand before walking and avoiding climbing.  She is improving. - Ambulatory referral to Physical Therapy  Patient Instructions                       Medicare Annual  Wellness Visit  [facility] and the medical providers at [facility] strive to bring you the best medical care.  In doing so we not only want to address your current medical conditions and concerns but also to detect new conditions early and prevent illness, disease and health-related problems.    Medicare offers a yearly Wellness Visit which allows our clinical staff to assess your need for preventative services including immunizations, lifestyle education, counseling to decrease risk of preventable diseases and screening for fall risk and other medical concerns.    This visit is provided free of charge (no copay) for all Medicare recipients. The clinical pharmacists at [facility] have begun to conduct these Wellness Visits which will also include a thorough review of all your medications.    As you primary medical provider recommend that you make an appointment for your Annual Wellness Visit if you  have not done so already this year.  You may set up this appointment before you leave today or you may call back ([phone]) and schedule an appointment.  Please make sure when you call that you mention that you are scheduling your Annual Wellness Visit with the clinical pharmacist so that the appointment may be made for the proper length of time.     Continue current medications. Continue good therapeutic lifestyle changes which include good diet and exercise. Fall precautions discussed with patient. If an FOBT was given today- please return it to our front desk. If you are over 32 years old - you may need Prevnar 51 or the adult Pneumonia vaccine.  **Flu shots are available--- please call and schedule a FLU-CLINIC appointment**  After your visit with [place] today you will receive a survey in the mail or online from [employer] regarding your care with [place]. Please take a moment to fill this out. Your feedback is very important to [place] as you can help [place] better understand your patient needs as well as improve your experience and satisfaction. WE CARE ABOUT YOU!!!   Continue to practice good hand and respiratory hygiene especially during the flu season Drink plenty of water and stay well-hydrated Arise slowly and stand at the location before you start walking Do not climb We will arrange for you to have physical therapy for gait strengthening and stability If possible and you do feel lightheaded in the future and you can get a blood pressure reading during this lightheadedness that would be great information for [place] to keep.  Arrie Senate MD

## 2017-12-27 LAB — CBC WITH DIFFERENTIAL/PLATELET
BASOS ABS: 0 10*3/uL (ref 0.0–0.2)
Basos: 0 %
EOS (ABSOLUTE): 0.1 10*3/uL (ref 0.0–0.4)
Eos: 1 %
Hematocrit: 41.4 % (ref 34.0–46.6)
Hemoglobin: 13.9 g/dL (ref 11.1–15.9)
Immature Grans (Abs): 0 10*3/uL (ref 0.0–0.1)
Immature Granulocytes: 0 %
LYMPHS ABS: 2.9 10*3/uL (ref 0.7–3.1)
Lymphs: 31 %
MCH: 31.4 pg (ref 26.6–33.0)
MCHC: 33.6 g/dL (ref 31.5–35.7)
MCV: 94 fL (ref 79–97)
MONOS ABS: 0.9 10*3/uL (ref 0.1–0.9)
Monocytes: 10 %
NEUTROS ABS: 5.3 10*3/uL (ref 1.4–7.0)
Neutrophils: 58 %
PLATELETS: 224 10*3/uL (ref 150–379)
RBC: 4.43 x10E6/uL (ref 3.77–5.28)
RDW: 13.4 % (ref 12.3–15.4)
WBC: 9.3 10*3/uL (ref 3.4–10.8)

## 2017-12-27 LAB — VITAMIN D 25 HYDROXY (VIT D DEFICIENCY, FRACTURES): VIT D 25 HYDROXY: 43.5 ng/mL (ref 30.0–100.0)

## 2017-12-27 LAB — HEPATIC FUNCTION PANEL
ALT: 20 IU/L (ref 0–32)
AST: 22 IU/L (ref 0–40)
Albumin: 4.3 g/dL (ref 3.5–4.7)
Alkaline Phosphatase: 49 IU/L (ref 39–117)
BILIRUBIN, DIRECT: 0.05 mg/dL (ref 0.00–0.40)
Bilirubin Total: <0.2 mg/dL (ref 0.0–1.2)
TOTAL PROTEIN: 6.5 g/dL (ref 6.0–8.5)

## 2017-12-27 LAB — BMP8+EGFR
BUN / CREAT RATIO: 15 (ref 12–28)
BUN: 10 mg/dL (ref 8–27)
CHLORIDE: 103 mmol/L (ref 96–106)
CO2: 23 mmol/L (ref 20–29)
Calcium: 9.5 mg/dL (ref 8.7–10.3)
Creatinine, Ser: 0.67 mg/dL (ref 0.57–1.00)
GFR calc Af Amer: 90 mL/min/{1.73_m2} (ref >59)
GFR calc non Af Amer: 78 mL/min/{1.73_m2} (ref >59)
GLUCOSE: 113 mg/dL — AB (ref 65–99)
POTASSIUM: 4.1 mmol/L (ref 3.5–5.2)
Sodium: 142 mmol/L (ref 134–144)

## 2017-12-27 LAB — LIPID PANEL
CHOLESTEROL TOTAL: 178 mg/dL (ref 100–199)
Chol/HDL Ratio: 2.4 ratio (ref 0.0–4.4)
HDL: 74 mg/dL (ref >39)
LDL CALC: 82 mg/dL (ref 0–99)
Triglycerides: 110 mg/dL (ref 0–149)
VLDL Cholesterol Cal: 22 mg/dL (ref 5–40)

## 2018-01-02 ENCOUNTER — Ambulatory Visit: Payer: Medicare Other | Attending: Family Medicine | Admitting: Physical Therapy

## 2018-01-02 DIAGNOSIS — M25551 Pain in right hip: Secondary | ICD-10-CM | POA: Diagnosis not present

## 2018-01-02 DIAGNOSIS — M6281 Muscle weakness (generalized): Secondary | ICD-10-CM | POA: Diagnosis not present

## 2018-01-02 DIAGNOSIS — R2681 Unsteadiness on feet: Secondary | ICD-10-CM | POA: Diagnosis not present

## 2018-01-02 NOTE — Therapy (Signed)
[facility] [place], [place], [address] Phone: [phone]   Fax:  [phone]  Physical Therapy Evaluation  Patient Details  Name: Kristi Sawyer MRN: [id number] Date of Birth: 1925/12/21 Referring Provider: Redge Gainer   Encounter Date: 01/02/2018  PT End of Session - 01/02/18 1344    Visit Number  1    Number of Visits  12    Date for PT Re-Evaluation  02/13/18    PT Start Time  1300    PT Stop Time  1342    PT Time Calculation (min)  42 min    Behavior During Therapy  [facility] for tasks assessed/performed       Past Medical History:  Diagnosis Date  . Acid reflux   . Anemia   . Anxiety   . Cataracts, bilateral   . Celiac disease/sprue   . Chronic gastritis   . Esophageal stricture   . Euthyroid   . Gastritis   . Hiatal hernia   . Hyperlipidemia   . Hypermobility syndrome   . 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 Neuropathy   . Osteoporosis   . Palpitations   . Postmenopausal HRT (hormone replacement therapy)   . Shingles   . Thyroid tumor, benign     Past Surgical History:  Procedure Laterality Date  . ABDOMINAL HYSTERECTOMY    . APPENDECTOMY  1935  . CATARACT EXTRACTION Bilateral   . COLONOSCOPY    . ESOPHAGOGASTRODUODENOSCOPY    . EYE SURGERY    . MYOMECTOMY    . PARTIAL HYSTERECTOMY  1974   w 1/2 right ovary   . RETINAL LASER PROCEDURE    . SIGMOIDOSCOPY    . THYROIDECTOMY  1975   rt. benign tumor     There were no vitals filed for this visit.   Subjective Assessment - 01/02/18 1519    Subjective  Patient arrives to physical therapy with reports of unsteadiness while standing and walking. Patient stated it began in December; she was unable to get out of bath tub. She was hospitalized on 10/26/17 and kept overnight for observation until 10/28/17. Patient stated she received home physical therapy and has been keeping up with HEP provided. Patient stated she walks in her home while  holding onto furniture for safety. Patient reports being fearful to leave home due to falling. She reported one fall; she tripped over her commode. Patient reported she has a cane but does not use it because she was not taught how to use it. She feels less safe and less stable with it. Patient would like to learn how to use cane for more stability. Patient reports no pain or dizziness. Patient's goal is to walk around block at home without help and feel more stable on her feet.     Pertinent History  Osteoporosis, HTN, Fall risk    Limitations  Walking;Standing;House hold activities;Other (comment) Fear of falling    Patient Stated Goals  be more stable and walk around neighborhood block independently.    Currently in Pain?  No/denies         [facility] PT Assessment - 01/02/18 0001      Assessment   Medical Diagnosis  Gait instability, general weakness    Referring Provider  Redge Gainer    Onset Date/Surgical Date  10/26/18    Next MD Visit  June 2019    Prior Therapy  yes, home PT      Precautions  Precautions  Fall      Balance Screen   Has the patient fallen in the past 6 months  Yes    How many times?  1    Has the patient had a decrease in activity level because of a fear of falling?   Yes    Is the patient reluctant to leave their home because of a fear of falling?   Yes      [place] residence    Type of [place] Access  Stairs to enter    Entrance Stairs-Number of Steps  3    Entrance Stairs-Rails  Can reach both      Sensation   Light Touch  Appears Intact      ROM / Strength   AROM / PROM / Strength  Strength;AROM      AROM   Overall AROM   Within functional limits for tasks performed    Overall AROM Comments  LE grossly assessed, WFL      Strength   Overall Strength  Within functional limits for tasks performed    Overall Strength Comments  Bilateral Hip flexion 4/5, Bilateral knee flexion 4/5, Bilateral knee  extension 4+/5      Special Tests   Other special tests  5x sit to stand: 0, unable to perform; 5x sit to stand with 1 UE support: 18 seconds      Transfers   Transfers  Independent with all Transfers with UE assist      Ambulation/Gait   Gait Pattern  Within Functional Limits;Step-through pattern;Decreased step length - right;Decreased step length - left;Decreased trunk rotation    Gait Comments  No AD.      Balance   Balance Assessed  Yes      Standardized Balance Assessment   Standardized Balance Assessment  Berg Balance Test      Berg Balance Test   Sit to Stand  Able to stand  independently using hands    Standing Unsupported  Able to stand safely 2 minutes    Sitting with Back Unsupported but Feet Supported on Floor or Stool  Able to sit 2 minutes under supervision    Stand to Sit  Uses backs of legs against chair to control descent    Transfers  Able to transfer safely, definite need of hands    Standing Unsupported with Eyes Closed  Able to stand 10 seconds with supervision    Standing Ubsupported with Feet Together  Able to place feet together independently and stand for 1 minute with supervision    From Standing, Reach Forward with Outstretched Arm  Can reach forward >12 cm safely (5")    From Standing Position, Pick up Object from Floor  Able to pick up shoe, needs supervision    From Standing Position, Turn to Look Behind Over each Shoulder  Turn sideways only but maintains balance    Turn 360 Degrees  Able to turn 360 degrees safely but slowly    Standing Unsupported, Alternately Place Feet on Step/Stool  Needs assistance to keep from falling or unable to try    Standing Unsupported, One Foot in [employer] balance while stepping or standing    Standing on One Leg  Tries to lift leg/unable to hold 3 seconds but remains standing independently    Total Score  32             Objective measurements completed  on examination: See above findings.      [facility]  PT Treatment/Exercise - 01/02/18 0001      Exercises   Exercises  Knee/Hip      Lumbar Exercises: Aerobic   Nustep  Level 3 x8 minutes with UE                  PT Long Term Goals - 01/02/18 1523      PT LONG TERM GOAL #1   Title  Patient will improve SLS to 5 seconds bilaterally for greater stability during walking and getting in and out of tub.    Time  6    Period  Weeks    Status  New    Target Date  02/13/18      PT LONG TERM GOAL #2   Title  Patient will improve Berg Balance scale to 45/56 to reduce her risk of falls.    Time  6    Period  Weeks    Status  New    Target Date  02/13/18      PT LONG TERM GOAL #3   Title  Patient will improve bilateral LE strength as seen by ability to perform 5x sit to stand without UE support in less than 20 seconds.    Time  6    Period  Weeks    Status  New    Target Date  02/13/18      PT LONG TERM GOAL #4   Title  Patient will report ability to walk around block with least restrictive AD or no AD with confidence/no fear of falling and no assistance of neighbor.    Time  6    Period  Weeks    Status  New    Target Date  02/13/18             Plan - 01/02/18 1514    Clinical Impression Statement  Patient is a pleasant 84 year old female who presents to physical therapy with decreased balance, fear of falling, generalized weakness and gait instability. Patient's LE AROM is WFL bilaterally. Patient's Berg Balance Scale score of 32 indicates high fall risk. Patient was unable to perform 5x sit to stand without UE support indicating bilateral LE weakness. Patient able to perform 5x sit to stand with right UE support in 18 seconds. Patient noted with decreased bilateral step length, decreased trunk rotation during gait; patient tended to walk close to wall but not holding it. Patient requested to bring in personal cane next visit to fit and educate on gait with AD. Patient would benefit from skilled physical therapy to  improve balance, decrease risk of falls and improve bilateral LE strength to return to PLOF and achieve personal goals.     Clinical Presentation  Stable    Clinical Decision Making  Low    Rehab Potential  Good    PT Frequency  2x / week    PT Duration  6 weeks    PT Treatment/Interventions  ADLs/Self Care Home Management;Electrical Stimulation;Therapeutic exercise;Therapeutic activities;Stair training;Gait training;Moist Heat;Patient/family education;Energy conservation;Neuromuscular re-education;Balance training    PT Next Visit Plan  Lower extremity strengthening, balance exercises, fit for cane.    Consulted and Agree with Plan of Care  Patient       Patient will benefit from skilled therapeutic intervention in order to improve the following deficits and impairments:  Decreased activity tolerance, Decreased endurance, Decreased strength, Decreased balance, Abnormal gait  Visit Diagnosis: Unsteadiness on  feet - Plan: PT plan of care cert/re-cert  Muscle weakness (generalized) - Plan: PT plan of care cert/re-cert     Problem List Patient Active Problem List   Diagnosis Date Noted  . Uncontrolled hypertension 10/28/2017  . Facial contusion, initial encounter   . Generalized weakness 10/26/2017  . Overactive bladder 06/18/2015  . Hyperlipidemia 09/26/2010  . ANXIETY 09/26/2010  . GERD (gastroesophageal reflux disease) 09/26/2010  . CELIAC SPRUE 09/26/2010  . Osteoporosis 09/26/2010   Gabriela Eves, PT, DPT 01/02/2018, 3:32 PM  [facility] [address] [place], [place], [address] Phone: [phone]   Fax:  [phone]  Name: Kristi Sawyer MRN: [id number] Date of Birth: 1926/07/18

## 2018-01-04 ENCOUNTER — Other Ambulatory Visit: Payer: Self-pay | Admitting: *Deleted

## 2018-01-04 ENCOUNTER — Ambulatory Visit: Payer: Medicare Other | Admitting: *Deleted

## 2018-01-04 DIAGNOSIS — R2681 Unsteadiness on feet: Secondary | ICD-10-CM | POA: Diagnosis not present

## 2018-01-04 DIAGNOSIS — M25551 Pain in right hip: Secondary | ICD-10-CM | POA: Diagnosis not present

## 2018-01-04 DIAGNOSIS — M6281 Muscle weakness (generalized): Secondary | ICD-10-CM

## 2018-01-04 MED ORDER — HYDRALAZINE HCL 10 MG PO TABS: 10.0000 mg | ORAL_TABLET | Freq: Three times a day (TID) | ORAL | 1 refills | Status: DC

## 2018-01-04 NOTE — Therapy (Signed)
[facility] [place], [place], [address] Phone: [phone]   Fax:  [phone]  Physical Therapy Treatment  Patient Details  Name: Kristi Sawyer MRN: [id number] Date of Birth: 27-Sep-1926 Referring Provider: Redge Gainer   Encounter Date: 01/04/2018  PT End of Session - 01/04/18 1225    Visit Number  2    Number of Visits  12    Date for PT Re-Evaluation  02/13/18    PT Start Time  1115    PT Stop Time  7672    PT Time Calculation (min)  50 min       Past Medical History:  Diagnosis Date  . Acid reflux   . Anemia   . Anxiety   . Cataracts, bilateral   . Celiac disease/sprue   . Chronic gastritis   . Esophageal stricture   . Euthyroid   . Gastritis   . Hiatal hernia   . Hyperlipidemia   . Hypermobility syndrome   . 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 Neuropathy   . Osteoporosis   . Palpitations   . Postmenopausal HRT (hormone replacement therapy)   . Shingles   . Thyroid tumor, benign     Past Surgical History:  Procedure Laterality Date  . ABDOMINAL HYSTERECTOMY    . APPENDECTOMY  1935  . CATARACT EXTRACTION Bilateral   . COLONOSCOPY    . ESOPHAGOGASTRODUODENOSCOPY    . EYE SURGERY    . MYOMECTOMY    . PARTIAL HYSTERECTOMY  1974   w 1/2 right ovary   . RETINAL LASER PROCEDURE    . SIGMOIDOSCOPY    . THYROIDECTOMY  1975   rt. benign tumor     There were no vitals filed for this visit.  Subjective Assessment - 01/04/18 1120    Subjective  Pt doing ok this AM. Work on balance    Pertinent History  Osteoporosis, HTN, Fall risk    Limitations  Walking;Standing;House hold activities;Other (comment)    Patient Stated Goals  be more stable and walk around neighborhood block independently.                      [facility] Adult PT Treatment/Exercise - 01/04/18 0001      Ambulation/Gait   Ambulation/Gait  Yes    Ambulation/Gait Assistance  5: Supervision    Ambulation  Distance (Feet)  150 Feet    Assistive device  Straight cane    Gait Pattern  Within Functional Limits      Exercises   Exercises  Knee/Hip      Lumbar Exercises: Aerobic   Nustep  Level 3 x  10 minutes with UE      Knee/Hip Exercises: Standing   Heel Raises  Both;2 sets;10 reps    Hip Flexion  AROM;Both;2 sets;10 reps    Hip Abduction  AROM;Both;2 sets;10 reps    Functional Squat  10 reps          Balance Exercises - 01/04/18 1213      Balance Exercises: Standing   Rockerboard  Intermittent UE support x 5 mins CCA PRN    Step Ups  6 inch;UE support 1;Intermittent UE support TOE TAPS 3x10             PT Long Term Goals - 01/02/18 1523      PT LONG TERM GOAL #1   Title  Patient will improve SLS to 5 seconds bilaterally for greater stability  during walking and getting in and out of tub.    Time  6    Period  Weeks    Status  New    Target Date  02/13/18      PT LONG TERM GOAL #2   Title  Patient will improve Berg Balance scale to 45/56 to reduce her risk of falls.    Time  6    Period  Weeks    Status  New    Target Date  02/13/18      PT LONG TERM GOAL #3   Title  Patient will improve bilateral LE strength as seen by ability to perform 5x sit to stand without UE support in less than 20 seconds.    Time  6    Period  Weeks    Status  New    Target Date  02/13/18      PT LONG TERM GOAL #4   Title  Patient will report ability to walk around block with least restrictive AD or no AD with confidence/no fear of falling and no assistance of neighbor.    Time  6    Period  Weeks    Status  New    Target Date  02/13/18            Plan - 01/04/18 1226    Clinical Impression Statement  Pt arrived today doing fairly well and brought her SPC to practice gait. Pt did well with ambulation with SPC and felt very stable. Her SPC is a little to tall for her , but it is a wooden cane that she wishes not to cut. Rx focuse don LE strengthening Exs and Balance  act.'s.. Rockerboard was very challenging.    Clinical Presentation  Stable    Rehab Potential  Good    PT Frequency  2x / week    PT Treatment/Interventions  ADLs/Self Care Home Management;Electrical Stimulation;Therapeutic exercise;Therapeutic activities;Stair training;Gait training;Moist Heat;Patient/family education;Energy conservation;Neuromuscular re-education;Balance training    PT Next Visit Plan  Lower extremity strengthening, balance exercises, fit for cane.    Consulted and Agree with Plan of Care  Patient       Patient will benefit from skilled therapeutic intervention in order to improve the following deficits and impairments:  Decreased activity tolerance, Decreased endurance, Decreased strength, Decreased balance, Abnormal gait  Visit Diagnosis: Unsteadiness on feet  Muscle weakness (generalized)  Pain in right hip     Problem List Patient Active Problem List   Diagnosis Date Noted  . Uncontrolled hypertension 10/28/2017  . Facial contusion, initial encounter   . Generalized weakness 10/26/2017  . Overactive bladder 06/18/2015  . Hyperlipidemia 09/26/2010  . ANXIETY 09/26/2010  . GERD (gastroesophageal reflux disease) 09/26/2010  . CELIAC SPRUE 09/26/2010  . Osteoporosis 09/26/2010    RAMSEUR,CHRIS, PTA 01/04/2018, 12:31 PM  [facility] [place], [place], [address] Phone: [phone]   Fax:  [phone]  Name: Kristi Sawyer MRN: [id number] Date of Birth: May 30, 1926

## 2018-01-08 ENCOUNTER — Ambulatory Visit: Payer: Medicare Other | Admitting: *Deleted

## 2018-01-08 ENCOUNTER — Other Ambulatory Visit: Payer: Self-pay

## 2018-01-08 ENCOUNTER — Encounter: Payer: Self-pay | Admitting: *Deleted

## 2018-01-08 DIAGNOSIS — M25551 Pain in right hip: Secondary | ICD-10-CM | POA: Diagnosis not present

## 2018-01-08 DIAGNOSIS — M6281 Muscle weakness (generalized): Secondary | ICD-10-CM

## 2018-01-08 DIAGNOSIS — R2681 Unsteadiness on feet: Secondary | ICD-10-CM | POA: Diagnosis not present

## 2018-01-08 MED ORDER — ROSUVASTATIN CALCIUM 5 MG PO TABS: ORAL_TABLET | ORAL | 1 refills | Status: DC

## 2018-01-08 NOTE — Therapy (Signed)
[facility] [place], [place], [address] Phone: [phone]   Fax:  [phone]  Physical Therapy Treatment  Patient Details  Name: Kristi Sawyer MRN: [id number] Date of Birth: 10-22-1926 Referring Provider: Redge Gainer   Encounter Date: 01/08/2018  PT End of Session - 01/08/18 1348    Visit Number  3    Number of Visits  12    Date for PT Re-Evaluation  02/13/18    PT Start Time  4034    PT Stop Time  7425    PT Time Calculation (min)  49 min       Past Medical History:  Diagnosis Date  . Acid reflux   . Anemia   . Anxiety   . Cataracts, bilateral   . Celiac disease/sprue   . Chronic gastritis   . Esophageal stricture   . Euthyroid   . Gastritis   . Hiatal hernia   . Hyperlipidemia   . Hypermobility syndrome   . 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 Neuropathy   . Osteoporosis   . Palpitations   . Postmenopausal HRT (hormone replacement therapy)   . Shingles   . Thyroid tumor, benign     Past Surgical History:  Procedure Laterality Date  . ABDOMINAL HYSTERECTOMY    . APPENDECTOMY  1935  . CATARACT EXTRACTION Bilateral   . COLONOSCOPY    . ESOPHAGOGASTRODUODENOSCOPY    . EYE SURGERY    . MYOMECTOMY    . PARTIAL HYSTERECTOMY  1974   w 1/2 right ovary   . RETINAL LASER PROCEDURE    . SIGMOIDOSCOPY    . THYROIDECTOMY  1975   rt. benign tumor     There were no vitals filed for this visit.  Subjective Assessment - 01/08/18 1346    Subjective  Pt doing ok this AM. Work on balance    Pertinent History  Osteoporosis, HTN, Fall risk    Limitations  Walking;Standing;House hold activities;Other (comment)    Patient Stated Goals  be more stable and walk around neighborhood block independently.    Currently in Pain?  No/denies                      [facility] Adult PT Treatment/Exercise - 01/08/18 0001      Exercises   Exercises  Knee/Hip      Lumbar Exercises: Aerobic   Nustep  Level 3 x  15 minutes with UE      Knee/Hip Exercises: Standing   Heel Raises  Both;2 sets;10 reps    Hip Flexion  AROM;Both;2 sets;10 reps    Hip Abduction  AROM;Both;2 sets;10 reps    Functional Squat  --      Knee/Hip Exercises: Seated   Long Arc Quad  Both;2 sets;10 reps    Long Arc Quad Weight  2 lbs.          Balance Exercises - 01/08/18 1414      Balance Exercises: Standing   Tandem Stance  Eyes open;5 reps;Intermittent upper extremity support    Standing, One Foot on a Step  Eyes open;6 inch;5 reps;10 secs    Rockerboard  Intermittent UE support    Step Ups  6 inch;UE support 1;Intermittent UE support TOE TAPS 3x10             PT Long Term Goals - 01/02/18 1523      PT LONG TERM GOAL #1   Title  Patient  will improve SLS to 5 seconds bilaterally for greater stability during walking and getting in and out of tub.    Time  6    Period  Weeks    Status  New    Target Date  02/13/18      PT LONG TERM GOAL #2   Title  Patient will improve Berg Balance scale to 45/56 to reduce her risk of falls.    Time  6    Period  Weeks    Status  New    Target Date  02/13/18      PT LONG TERM GOAL #3   Title  Patient will improve bilateral LE strength as seen by ability to perform 5x sit to stand without UE support in less than 20 seconds.    Time  6    Period  Weeks    Status  New    Target Date  02/13/18      PT LONG TERM GOAL #4   Title  Patient will report ability to walk around block with least restrictive AD or no AD with confidence/no fear of falling and no assistance of neighbor.    Time  6    Period  Weeks    Status  New    Target Date  02/13/18            Plan - 01/08/18 1348    Clinical Impression Statement  Pt arrived today doing fairly welland in good spirits. She was not using SPC for ambulation today because she feels that she doesn't need it yet. She tolerated exs a little better today and was able to do more. She was still very  challenged with balance act.'s and needs assistance with most of the Exs/Act.'s.       Clinical Presentation  Stable    Rehab Potential  Good    PT Frequency  2x / week    PT Duration  6 weeks    PT Treatment/Interventions  ADLs/Self Care Home Management;Electrical Stimulation;Therapeutic exercise;Therapeutic activities;Stair training;Gait training;Moist Heat;Patient/family education;Energy conservation;Neuromuscular re-education;Balance training    PT Next Visit Plan  Lower extremity strengthening, balance exercises, fit for cane.    Consulted and Agree with Plan of Care  Patient       Patient will benefit from skilled therapeutic intervention in order to improve the following deficits and impairments:  Decreased activity tolerance, Decreased endurance, Decreased strength, Decreased balance, Abnormal gait  Visit Diagnosis: Unsteadiness on feet  Muscle weakness (generalized)     Problem List Patient Active Problem List   Diagnosis Date Noted  . Uncontrolled hypertension 10/28/2017  . Facial contusion, initial encounter   . Generalized weakness 10/26/2017  . Overactive bladder 06/18/2015  . Hyperlipidemia 09/26/2010  . ANXIETY 09/26/2010  . GERD (gastroesophageal reflux disease) 09/26/2010  . CELIAC SPRUE 09/26/2010  . Osteoporosis 09/26/2010    Trula Frede,CHRIS, PTA 01/08/2018, 3:37 PM  [facility] [address] [place], [place], [address] Phone: [phone]   Fax:  [phone]  Name: BERRY GALLACHER MRN: [id number] Date of Birth: [birth year]

## 2018-01-09 ENCOUNTER — Ambulatory Visit: Payer: Medicare Other | Admitting: Physical Therapy

## 2018-01-09 DIAGNOSIS — M25551 Pain in right hip: Secondary | ICD-10-CM

## 2018-01-09 DIAGNOSIS — R2681 Unsteadiness on feet: Secondary | ICD-10-CM | POA: Diagnosis not present

## 2018-01-09 DIAGNOSIS — M6281 Muscle weakness (generalized): Secondary | ICD-10-CM

## 2018-01-09 NOTE — Therapy (Signed)
[facility] [place], [place], [address] Phone: [phone]   Fax:  [phone]  Physical Therapy Treatment  Patient Details  Name: Kristi Sawyer MRN: [id number] Date of Birth: 07/21/1926 Referring Provider: Redge Gainer   Encounter Date: 01/09/2018  PT End of Session - 01/09/18 1438    Visit Number  4    Number of Visits  12    Date for PT Re-Evaluation  02/13/18    PT Start Time  1430    PT Stop Time  1520    PT Time Calculation (min)  50 min    Activity Tolerance  Patient tolerated treatment well    Behavior During Therapy  [facility] for tasks assessed/performed       Past Medical History:  Diagnosis Date  . Acid reflux   . Anemia   . Anxiety   . Cataracts, bilateral   . Celiac disease/sprue   . Chronic gastritis   . Esophageal stricture   . Euthyroid   . Gastritis   . Hiatal hernia   . Hyperlipidemia   . Hypermobility syndrome   . 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 Neuropathy   . Osteoporosis   . Palpitations   . Postmenopausal HRT (hormone replacement therapy)   . Shingles   . Thyroid tumor, benign     Past Surgical History:  Procedure Laterality Date  . ABDOMINAL HYSTERECTOMY    . APPENDECTOMY  1935  . CATARACT EXTRACTION Bilateral   . COLONOSCOPY    . ESOPHAGOGASTRODUODENOSCOPY    . EYE SURGERY    . MYOMECTOMY    . PARTIAL HYSTERECTOMY  1974   w 1/2 right ovary   . RETINAL LASER PROCEDURE    . SIGMOIDOSCOPY    . THYROIDECTOMY  1975   rt. benign tumor     There were no vitals filed for this visit.  Subjective Assessment - 01/09/18 1438    Subjective  Patient stated she's feeling good. Exercises are a good challenge especially the rockerboard. Patient states when she falls, she falls foward rather than back.    Pertinent History  Osteoporosis, HTN, Fall risk    Limitations  Walking;Standing;House hold activities;Other (comment)    Patient Stated Goals  be more stable and walk  around neighborhood block independently.         [facility] PT Assessment - 01/09/18 0001      Assessment   Medical Diagnosis  Gait instability, general weakness    Referring Provider  Redge Gainer    Onset Date/Surgical Date  10/26/18    Next MD Visit  June 2019    Prior Therapy  yes, home PT      Precautions   Precautions  Fall                  [facility] Adult PT Treatment/Exercise - 01/09/18 0001      Transfers   Five time sit to stand comments   15 seconds      Exercises   Exercises  Knee/Hip      Lumbar Exercises: Aerobic   Nustep  Level 3 x  15 minutes with UE      Knee/Hip Exercises: Seated   Clamshell with TheraBand  Yellow x20    Marching  Strengthening;20 reps emphasis on draw in    Sit to Sand  10 reps;without UE support          Balance Exercises - 01/09/18 1520  Balance Exercises: Standing   Standing Eyes Opened  Narrow base of support (BOS);Head turns;30 secs;2 reps;Foam/compliant surface flexion/extnsion, rotation    Standing Eyes Closed  Narrow base of support (BOS);Solid surface;3 reps;30 secs    Rockerboard  Anterior/posterior;Lateral;EO;Intermittent UE support 1 minute each    Gait with Head Turns  Forward;2 reps length of carpeted hall way ~30 feet     Marching Limitations  marching down carpeted hall way ~30 feet x4 with gait belt             PT Long Term Goals - 01/02/18 1523      PT LONG TERM GOAL #1   Title  Patient will improve SLS to 5 seconds bilaterally for greater stability during walking and getting in and out of tub.    Time  6    Period  Weeks    Status  New    Target Date  02/13/18      PT LONG TERM GOAL #2   Title  Patient will improve Berg Balance scale to 45/56 to reduce her risk of falls.    Time  6    Period  Weeks    Status  New    Target Date  02/13/18      PT LONG TERM GOAL #3   Title  Patient will improve bilateral LE strength as seen by ability to perform 5x sit to stand without UE support in  less than 20 seconds.    Time  6    Period  Weeks    Status  New    Target Date  02/13/18      PT LONG TERM GOAL #4   Title  Patient will report ability to walk around block with least restrictive AD or no AD with confidence/no fear of falling and no assistance of neighbor.    Time  6    Period  Weeks    Status  New    Target Date  02/13/18            Plan - 01/09/18 1525    Clinical Impression Statement  Patient performed strengthening exercises with no complaints. Patient still challenged with balance activities which requires contact guard and use of gait belt to prevent a loss of balance. During gait activities, patient's arms were in high guard position and required CGA assist. Patient stated she feels challenged with balance exercises but feels she making some improvements. Patient noted with increased AP sway with eyes closed. Patient demonstrates strong carryover of verbal cuing as noted by the ability to recite cues when she knows she needs to correct her positioning/ form.     Clinical Presentation  Stable    Clinical Decision Making  Low    Rehab Potential  Good    PT Frequency  2x / week    PT Duration  6 weeks    PT Treatment/Interventions  ADLs/Self Care Home Management;Electrical Stimulation;Therapeutic exercise;Therapeutic activities;Stair training;Gait training;Moist Heat;Patient/family education;Energy conservation;Neuromuscular re-education;Balance training    PT Next Visit Plan  Lower extremity strengthening, balance exercises.    Consulted and Agree with Plan of Care  Patient       Patient will benefit from skilled therapeutic intervention in order to improve the following deficits and impairments:  Decreased activity tolerance, Decreased endurance, Decreased strength, Decreased balance, Abnormal gait  Visit Diagnosis: Unsteadiness on feet  Muscle weakness (generalized)  Pain in right hip     Problem List Patient Active Problem List   Diagnosis Date  Noted  . Uncontrolled hypertension 10/28/2017  . Facial contusion, initial encounter   . Generalized weakness 10/26/2017  . Overactive bladder 06/18/2015  . Hyperlipidemia 09/26/2010  . ANXIETY 09/26/2010  . GERD (gastroesophageal reflux disease) 09/26/2010  . CELIAC SPRUE 09/26/2010  . Osteoporosis 09/26/2010   Gabriela Eves, PT, DPT 01/09/2018, 3:32 PM  [facility] [address] [place], [place], [address] Phone: [phone]   Fax:  [phone]  Name: Kristi Sawyer MRN: [id number] Date of Birth: 1926/05/17

## 2018-01-12 ENCOUNTER — Other Ambulatory Visit: Payer: Self-pay | Admitting: Family Medicine

## 2018-01-15 ENCOUNTER — Ambulatory Visit: Payer: Medicare Other | Attending: Family Medicine | Admitting: *Deleted

## 2018-01-15 DIAGNOSIS — R2681 Unsteadiness on feet: Secondary | ICD-10-CM

## 2018-01-15 DIAGNOSIS — M6281 Muscle weakness (generalized): Secondary | ICD-10-CM | POA: Diagnosis not present

## 2018-01-15 DIAGNOSIS — M25551 Pain in right hip: Secondary | ICD-10-CM | POA: Diagnosis not present

## 2018-01-15 NOTE — Therapy (Signed)
[facility] [place], [place], [address] Phone: [phone]   Fax:  [phone]  Physical Therapy Treatment  Patient Details  Name: Kristi Sawyer MRN: [id number] Date of Birth: Jun 05, 1926 Referring Provider: Redge Gainer   Encounter Date: 01/15/2018  PT End of Session - 01/15/18 1250    Visit Number  5    Number of Visits  12    Date for PT Re-Evaluation  02/13/18    PT Start Time  1115    PT Stop Time  9373    PT Time Calculation (min)  49 min       Past Medical History:  Diagnosis Date  . Acid reflux   . Anemia   . Anxiety   . Cataracts, bilateral   . Celiac disease/sprue   . Chronic gastritis   . Esophageal stricture   . Euthyroid   . Gastritis   . Hiatal hernia   . Hyperlipidemia   . Hypermobility syndrome   . 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 Neuropathy   . Osteoporosis   . Palpitations   . Postmenopausal HRT (hormone replacement therapy)   . Shingles   . Thyroid tumor, benign     Past Surgical History:  Procedure Laterality Date  . ABDOMINAL HYSTERECTOMY    . APPENDECTOMY  1935  . CATARACT EXTRACTION Bilateral   . COLONOSCOPY    . ESOPHAGOGASTRODUODENOSCOPY    . EYE SURGERY    . MYOMECTOMY    . PARTIAL HYSTERECTOMY  1974   w 1/2 right ovary   . RETINAL LASER PROCEDURE    . SIGMOIDOSCOPY    . THYROIDECTOMY  1975   rt. benign tumor     There were no vitals filed for this visit.                   [facility] Adult PT Treatment/Exercise - 01/15/18 0001      Transfers   Five time sit to stand comments   15 seconds      Exercises   Exercises  Knee/Hip      Lumbar Exercises: Aerobic   Nustep  Level 3 x  15 minutes with UE      Knee/Hip Exercises: Standing   Hip Flexion  AROM;Both;2 sets;10 reps    Hip Abduction  AROM;Both;2 sets;10 reps      Knee/Hip Exercises: Seated   Sit to Sand  10 reps;without UE support x5 was 18 secs          Balance Exercises  - 01/15/18 1140      Balance Exercises: Standing   Standing Eyes Opened  Narrow base of support (BOS);Head turns;30 secs;2 reps;Foam/compliant surface    Standing Eyes Closed  Narrow base of support (BOS);Solid surface;3 reps;30 secs    Rockerboard  Anterior/posterior;Lateral;EO;Intermittent UE support x5 mins    Step Ups  6 inch;UE support 1;Intermittent UE support;Forward;Lateral TOE TAPS 3x10 forward and side each LE             PT Long Term Goals - 01/02/18 1523      PT LONG TERM GOAL #1   Title  Patient will improve SLS to 5 seconds bilaterally for greater stability during walking and getting in and out of tub.    Time  6    Period  Weeks    Status  New    Target Date  02/13/18      PT LONG TERM GOAL #2  Title  Patient will improve Berg Balance scale to 45/56 to reduce her risk of falls.    Time  6    Period  Weeks    Status  New    Target Date  02/13/18      PT LONG TERM GOAL #3   Title  Patient will improve bilateral LE strength as seen by ability to perform 5x sit to stand without UE support in less than 20 seconds.    Time  6    Period  Weeks    Status  New    Target Date  02/13/18      PT LONG TERM GOAL #4   Title  Patient will report ability to walk around block with least restrictive AD or no AD with confidence/no fear of falling and no assistance of neighbor.    Time  6    Period  Weeks    Status  New    Target Date  02/13/18            Plan - 01/15/18 1250    Clinical Impression Statement  Pt arrived today feeling fairly well and feels the she is progressing with better balance. She reports that she is performing her home exs daily. She was still challenged by balance acts,but much more comfortable with  not using UEs for support and allowing more CGA.    Clinical Presentation  Stable    Clinical Decision Making  Low    Rehab Potential  Good    PT Frequency  2x / week    PT Duration  6 weeks    PT Treatment/Interventions  ADLs/Self Care Home  Management;Electrical Stimulation;Therapeutic exercise;Therapeutic activities;Stair training;Gait training;Moist Heat;Patient/family education;Energy conservation;Neuromuscular re-education;Balance training    PT Next Visit Plan  Lower extremity strengthening, balance exercises.    Consulted and Agree with Plan of Care  Patient       Patient will benefit from skilled therapeutic intervention in order to improve the following deficits and impairments:  Impaired sensation  Visit Diagnosis: Unsteadiness on feet  Muscle weakness (generalized)  Pain in right hip     Problem List Patient Active Problem List   Diagnosis Date Noted  . Uncontrolled hypertension 10/28/2017  . Facial contusion, initial encounter   . Generalized weakness 10/26/2017  . Overactive bladder 06/18/2015  . Hyperlipidemia 09/26/2010  . ANXIETY 09/26/2010  . GERD (gastroesophageal reflux disease) 09/26/2010  . CELIAC SPRUE 09/26/2010  . Osteoporosis 09/26/2010    RAMSEUR,CHRIS, PTA 01/15/2018, 12:54 PM  [facility] [address] [place], [place], [address] Phone: [phone]   Fax:  [phone]  Name: Kristi Sawyer MRN: [id number] Date of Birth: 08-15-1926

## 2018-01-17 ENCOUNTER — Other Ambulatory Visit: Payer: Self-pay | Admitting: Internal Medicine

## 2018-01-18 ENCOUNTER — Ambulatory Visit: Payer: Medicare Other | Admitting: *Deleted

## 2018-01-18 DIAGNOSIS — R2681 Unsteadiness on feet: Secondary | ICD-10-CM

## 2018-01-18 DIAGNOSIS — M25551 Pain in right hip: Secondary | ICD-10-CM

## 2018-01-18 DIAGNOSIS — M6281 Muscle weakness (generalized): Secondary | ICD-10-CM | POA: Diagnosis not present

## 2018-01-18 NOTE — Therapy (Signed)
[facility] [place], [place], [address] Phone: [phone]   Fax:  [phone]  Physical Therapy Treatment  Patient Details  Name: Kristi Sawyer MRN: [id number] Date of Birth: 1926/10/05 Referring Provider: Redge Gainer   Encounter Date: 01/18/2018  PT End of Session - 01/18/18 1306    Visit Number  6    Number of Visits  12    Date for PT Re-Evaluation  02/13/18    PT Start Time  1115    PT Stop Time  9381    PT Time Calculation (min)  49 min       Past Medical History:  Diagnosis Date  . Acid reflux   . Anemia   . Anxiety   . Cataracts, bilateral   . Celiac disease/sprue   . Chronic gastritis   . Esophageal stricture   . Euthyroid   . Gastritis   . Hiatal hernia   . Hyperlipidemia   . Hypermobility syndrome   . 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 Neuropathy   . Osteoporosis   . Palpitations   . Postmenopausal HRT (hormone replacement therapy)   . Shingles   . Thyroid tumor, benign     Past Surgical History:  Procedure Laterality Date  . ABDOMINAL HYSTERECTOMY    . APPENDECTOMY  1935  . CATARACT EXTRACTION Bilateral   . COLONOSCOPY    . ESOPHAGOGASTRODUODENOSCOPY    . EYE SURGERY    . MYOMECTOMY    . PARTIAL HYSTERECTOMY  1974   w 1/2 right ovary   . RETINAL LASER PROCEDURE    . SIGMOIDOSCOPY    . THYROIDECTOMY  1975   rt. benign tumor     There were no vitals filed for this visit.  Subjective Assessment - 01/18/18 1126    Subjective  Did ok after last Rx    Pertinent History  Osteoporosis, HTN, Fall risk    Limitations  Walking;Standing;House hold activities;Other (comment)    Patient Stated Goals  be more stable and walk around neighborhood block independently.    Currently in Pain?  No/denies                      [facility] Adult PT Treatment/Exercise - 01/18/18 0001      Transfers   Five time sit to stand comments   17 secs no UE assist      Exercises   Exercises  Knee/Hip      Lumbar Exercises: Aerobic   Nustep  Level 3 x  15 minutes with UE      Knee/Hip Exercises: Standing   Hip Flexion  AROM;Both;2 sets;10 reps    Hip Abduction  AROM;Both;2 sets;10 reps      Knee/Hip Exercises: Seated   Ball Squeeze  3x10    Clamshell with TheraBand  Red 3x10    Marching  Strengthening;20 reps emphasis on draw in    Sit to Sand  10 reps;without UE support x5 was 18 secs          Balance Exercises - 01/18/18 1131      Balance Exercises: Standing   Standing Eyes Opened  Narrow base of support (BOS);Head turns;30 secs;2 reps;Foam/compliant surface    Standing Eyes Closed  Narrow base of support (BOS);Solid surface;3 reps;30 secs    Rockerboard  Anterior/posterior;Lateral;EO;Intermittent UE support 4 mins    Step Ups  6 inch;UE support 1;Intermittent UE support;Forward;Lateral  PT Long Term Goals - 01/18/18 1155      PT LONG TERM GOAL #1   Title  Patient will improve SLS to 5 seconds bilaterally for greater stability during walking and getting in and out of tub.    Time  6    Period  Weeks    Status  On-going      PT LONG TERM GOAL #2   Title  Patient will improve Berg Balance scale to 45/56 to reduce her risk of falls.    Time  6    Period  Weeks    Status  On-going      PT LONG TERM GOAL #3   Title  Patient will improve bilateral LE strength as seen by ability to perform 5x sit to stand without UE support in less than 20 seconds.    Time  6    Period  Weeks    Status  On-going      PT LONG TERM GOAL #4   Title  Patient will report ability to walk around block with least restrictive AD or no AD with confidence/no fear of falling and no assistance of neighbor.    Time  6    Period  Weeks    Status  On-going            Plan - 01/18/18 1308    Clinical Impression Statement  Pt arrived to clinic today in good spirits. She was able to perform all therex for strengthening as well as balance act.'s without  c/o pain and mainly fatigue. She continues to progress  and was able to complete sit to stand from mat without UE assist x 5 in 17secs. We will try from a chair next visit.. Cont with CGA during act's    Clinical Presentation  Stable    Clinical Decision Making  Low    Rehab Potential  Good       Patient will benefit from skilled therapeutic intervention in order to improve the following deficits and impairments:     Visit Diagnosis: Muscle weakness (generalized)  Pain in right hip  Unsteadiness on feet     Problem List Patient Active Problem List   Diagnosis Date Noted  . Uncontrolled hypertension 10/28/2017  . Facial contusion, initial encounter   . Generalized weakness 10/26/2017  . Overactive bladder 06/18/2015  . Hyperlipidemia 09/26/2010  . ANXIETY 09/26/2010  . GERD (gastroesophageal reflux disease) 09/26/2010  . CELIAC SPRUE 09/26/2010  . Osteoporosis 09/26/2010    RAMSEUR,CHRIS 01/18/2018, 1:20 PM  [facility] [address] [place], [place], [address] Phone: [phone]   Fax:  [phone]  Name: Kristi Sawyer MRN: [id number] Date of Birth: 1926-01-16

## 2018-01-22 ENCOUNTER — Ambulatory Visit: Payer: Medicare Other | Admitting: Physical Therapy

## 2018-01-22 DIAGNOSIS — M6281 Muscle weakness (generalized): Secondary | ICD-10-CM

## 2018-01-22 DIAGNOSIS — M25551 Pain in right hip: Secondary | ICD-10-CM | POA: Diagnosis not present

## 2018-01-22 DIAGNOSIS — R2681 Unsteadiness on feet: Secondary | ICD-10-CM

## 2018-01-22 NOTE — Therapy (Signed)
[facility] [place], [place], [address] Phone: [phone]   Fax:  [phone]  Physical Therapy Treatment  Patient Details  Name: Kristi Sawyer MRN: [id number] Date of Birth: 08-25-1926 Referring Provider: Redge Gainer   Encounter Date: 01/22/2018  PT End of Session - 01/22/18 1651    Visit Number  7    Number of Visits  12    Date for PT Re-Evaluation  02/13/18    PT Start Time  5277    PT Stop Time  1721 Patient has another appointment    PT Time Calculation (min)  36 min    Activity Tolerance  Patient tolerated treatment well    Behavior During Therapy  [facility] for tasks assessed/performed       Past Medical History:  Diagnosis Date  . Acid reflux   . Anemia   . Anxiety   . Cataracts, bilateral   . Celiac disease/sprue   . Chronic gastritis   . Esophageal stricture   . Euthyroid   . Gastritis   . Hiatal hernia   . Hyperlipidemia   . Hypermobility syndrome   . 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 Neuropathy   . Osteoporosis   . Palpitations   . Postmenopausal HRT (hormone replacement therapy)   . Shingles   . Thyroid tumor, benign     Past Surgical History:  Procedure Laterality Date  . ABDOMINAL HYSTERECTOMY    . APPENDECTOMY  1935  . CATARACT EXTRACTION Bilateral   . COLONOSCOPY    . ESOPHAGOGASTRODUODENOSCOPY    . EYE SURGERY    . MYOMECTOMY    . PARTIAL HYSTERECTOMY  1974   w 1/2 right ovary   . RETINAL LASER PROCEDURE    . SIGMOIDOSCOPY    . THYROIDECTOMY  1975   rt. benign tumor     There were no vitals filed for this visit.  Subjective Assessment - 01/22/18 1728    Subjective  Patient stated she feels good.     Pertinent History  Osteoporosis, HTN, Fall risk    Limitations  Walking;Standing;House hold activities;Other (comment)    Patient Stated Goals  be more stable and walk around neighborhood block independently.    Currently in Pain?  No/denies                       [facility] Adult PT Treatment/Exercise - 01/22/18 0001      Exercises   Exercises  Knee/Hip      Lumbar Exercises: Seated   Long Arc Quad on Chair  Strengthening;Both;2 sets;10 reps      Knee/Hip Exercises: Seated   Ball Squeeze  3x10    Clamshell with TheraBand  Red 20    Hamstring Curl  Strengthening;2 sets;10 reps red therabad    Sit to Sand  20 reps;without UE support          Balance Exercises - 01/22/18 1704      Balance Exercises: Standing   Step Ups  6 inch;Forward;UE support 1;Intermittent UE support step taps    Step Over Hurdles / Cones  forward and lateral step over 3 stacked cones with both UE support then 1 UE support x3 each    Heel Raises Limitations  x20, both UE support    Other Standing Exercises  reaching in and out of BOS with 1 UE support, 10 cones, 1x each UE  PT Long Term Goals - 01/18/18 1155      PT LONG TERM GOAL #1   Title  Patient will improve SLS to 5 seconds bilaterally for greater stability during walking and getting in and out of tub.    Time  6    Period  Weeks    Status  On-going      PT LONG TERM GOAL #2   Title  Patient will improve Berg Balance scale to 45/56 to reduce her risk of falls.    Time  6    Period  Weeks    Status  On-going      PT LONG TERM GOAL #3   Title  Patient will improve bilateral LE strength as seen by ability to perform 5x sit to stand without UE support in less than 20 seconds.    Time  6    Period  Weeks    Status  On-going      PT LONG TERM GOAL #4   Title  Patient will report ability to walk around block with least restrictive AD or no AD with confidence/no fear of falling and no assistance of neighbor.    Time  6    Period  Weeks    Status  On-going            Plan - 01/22/18 1724    Clinical Impression Statement  Patient was able to complete balance exercises with minimal AP and lateral sway. She was able to challenge baance exercises herself  by reducing the UE support without PT request. Patient demonstrated very good weight shifting during reaching in and out of BOS. Patient feels her balance has improved but patient stated she still is not confident with walking around the block around her home independently. PT and patient discussed walking around with a neighbor but having the neighbor a few steps behind to simulate she is walking alone. Patient in agreement.     Clinical Presentation  Stable    Rehab Potential  Good    PT Frequency  2x / week    PT Duration  6 weeks    PT Treatment/Interventions  ADLs/Self Care Home Management;Electrical Stimulation;Therapeutic exercise;Therapeutic activities;Stair training;Gait training;Moist Heat;Patient/family education;Energy conservation;Neuromuscular re-education;Balance training    PT Next Visit Plan  Sit to stands from standard chair. Lower extremity strengthening, balance exercises.    Consulted and Agree with Plan of Care  Patient       Patient will benefit from skilled therapeutic intervention in order to improve the following deficits and impairments:  Abnormal gait, Decreased activity tolerance, Decreased endurance, Decreased strength, Decreased balance  Visit Diagnosis: Muscle weakness (generalized)  Unsteadiness on feet     Problem List Patient Active Problem List   Diagnosis Date Noted  . Uncontrolled hypertension 10/28/2017  . Facial contusion, initial encounter   . Generalized weakness 10/26/2017  . Overactive bladder 06/18/2015  . Hyperlipidemia 09/26/2010  . ANXIETY 09/26/2010  . GERD (gastroesophageal reflux disease) 09/26/2010  . CELIAC SPRUE 09/26/2010  . Osteoporosis 09/26/2010   Gabriela Eves, PT, DPT 01/22/2018, 5:35 PM  [facility] [address] [place], [place], [address] Phone: [phone]   Fax:  [phone]  Name: Kristi Sawyer MRN: [id number] Date of Birth: 1926/04/01

## 2018-01-25 ENCOUNTER — Other Ambulatory Visit: Payer: Self-pay | Admitting: *Deleted

## 2018-01-25 ENCOUNTER — Ambulatory Visit: Payer: Medicare Other | Admitting: Physical Therapy

## 2018-01-25 ENCOUNTER — Encounter: Payer: Self-pay | Admitting: Physical Therapy

## 2018-01-25 DIAGNOSIS — M6281 Muscle weakness (generalized): Secondary | ICD-10-CM

## 2018-01-25 DIAGNOSIS — M25551 Pain in right hip: Secondary | ICD-10-CM

## 2018-01-25 DIAGNOSIS — R2681 Unsteadiness on feet: Secondary | ICD-10-CM | POA: Diagnosis not present

## 2018-01-25 MED ORDER — HYDRALAZINE HCL 10 MG PO TABS: 10.0000 mg | ORAL_TABLET | Freq: Three times a day (TID) | ORAL | 1 refills | Status: DC

## 2018-01-25 NOTE — Therapy (Signed)
[facility] [place], [place], [address] Phone: [phone]   Fax:  [phone]  Physical Therapy Treatment  Patient Details  Name: Kristi Sawyer MRN: [id number] Date of Birth: [birth year] Referring Provider: Redge Gainer   Encounter Date: 01/25/2018  PT End of Session - 01/25/18 0959    Visit Number  8    Number of Visits  12    Date for PT Re-Evaluation  02/13/18    PT Start Time  0900    PT Stop Time  0944    PT Time Calculation (min)  44 min    Activity Tolerance  Patient tolerated treatment well    Behavior During Therapy  [facility] for tasks assessed/performed       Past Medical History:  Diagnosis Date  . Acid reflux   . Anemia   . Anxiety   . Cataracts, bilateral   . Celiac disease/sprue   . Chronic gastritis   . Esophageal stricture   . Euthyroid   . Gastritis   . Hiatal hernia   . Hyperlipidemia   . Hypermobility syndrome   . 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 Neuropathy   . Osteoporosis   . Palpitations   . Postmenopausal HRT (hormone replacement therapy)   . Shingles   . Thyroid tumor, benign     Past Surgical History:  Procedure Laterality Date  . ABDOMINAL HYSTERECTOMY    . APPENDECTOMY  1935  . CATARACT EXTRACTION Bilateral   . COLONOSCOPY    . ESOPHAGOGASTRODUODENOSCOPY    . EYE SURGERY    . MYOMECTOMY    . PARTIAL HYSTERECTOMY  1974   w 1/2 right ovary   . RETINAL LASER PROCEDURE    . SIGMOIDOSCOPY    . THYROIDECTOMY  1975   rt. benign tumor     There were no vitals filed for this visit.  Subjective Assessment - 01/25/18 0915    Subjective  I'm doing good.    Patient Stated Goals  be more stable and walk around neighborhood block independently.                      [facility] Adult PT Treatment/Exercise - 01/25/18 0001      Exercises   Exercises  Knee/Hip      Lumbar Exercises: Aerobic   Nustep  Level 4 x 15 minutes.      Knee/Hip Exercises:  Machines for Strengthening   Cybex Knee Extension  10# x 3 minutes.    Cybex Knee Flexion  20# x 3 minutes.          Balance Exercises - 01/25/18 0919      Balance Exercises: Standing   Other Standing Exercises  Resisted walking with green XTS 1 minutes each direction (4 minutes total) Rockerboard in parallel bars forward and back x 3 minutes f/b side to side to 3 minutes f/b inverted BOSU ball x 5 minutes with all actviites performed in parallel bars.             PT Long Term Goals - 01/18/18 1155      PT LONG TERM GOAL #1   Title  Patient will improve SLS to 5 seconds bilaterally for greater stability during walking and getting in and out of tub.    Time  6    Period  Weeks    Status  On-going      PT LONG TERM GOAL #2   Title  Patient will improve Berg Balance scale to 45/56 to reduce her risk of falls.    Time  6    Period  Weeks    Status  On-going      PT LONG TERM GOAL #3   Title  Patient will improve bilateral LE strength as seen by ability to perform 5x sit to stand without UE support in less than 20 seconds.    Time  6    Period  Weeks    Status  On-going      PT LONG TERM GOAL #4   Title  Patient will report ability to walk around block with least restrictive AD or no AD with confidence/no fear of falling and no assistance of neighbor.    Time  6    Period  Weeks    Status  On-going            Plan - 01/25/18 0955    Clinical Impression Statement  Patient did an excellent job today with the addition of strengthening to LE's on machines today.    PT Treatment/Interventions  ADLs/Self Care Home Management;Electrical Stimulation;Therapeutic exercise;Therapeutic activities;Stair training;Gait training;Moist Heat;Patient/family education;Energy conservation;Neuromuscular re-education;Balance training    PT Next Visit Plan  Sit to stands from standard chair. Lower extremity strengthening, balance exercises.    Consulted and Agree with Plan of Care   Patient       Patient will benefit from skilled therapeutic intervention in order to improve the following deficits and impairments:     Visit Diagnosis: Muscle weakness (generalized)  Unsteadiness on feet  Pain in right hip     Problem List Patient Active Problem List   Diagnosis Date Noted  . Uncontrolled hypertension 10/28/2017  . Facial contusion, initial encounter   . Generalized weakness 10/26/2017  . Overactive bladder 06/18/2015  . Hyperlipidemia 09/26/2010  . ANXIETY 09/26/2010  . GERD (gastroesophageal reflux disease) 09/26/2010  . CELIAC SPRUE 09/26/2010  . Osteoporosis 09/26/2010    Teah Votaw, [place] MPT 01/25/2018, 9:59 AM  [facility] [address] [place], [place], [address] Phone: [phone]   Fax:  [phone]  Name: Kristi Sawyer MRN: [id number] Date of Birth: 14-Jan-1926

## 2018-01-29 ENCOUNTER — Encounter: Payer: Self-pay | Admitting: Physical Therapy

## 2018-01-29 ENCOUNTER — Ambulatory Visit: Payer: Medicare Other | Admitting: Physical Therapy

## 2018-01-29 DIAGNOSIS — R2681 Unsteadiness on feet: Secondary | ICD-10-CM

## 2018-01-29 DIAGNOSIS — M6281 Muscle weakness (generalized): Secondary | ICD-10-CM

## 2018-01-29 DIAGNOSIS — M25551 Pain in right hip: Secondary | ICD-10-CM | POA: Diagnosis not present

## 2018-01-29 NOTE — Therapy (Signed)
[facility] [place], [place], [address] Phone: [phone]   Fax:  [phone]  Physical Therapy Treatment  Patient Details  Name: Kristi Sawyer MRN: [id number] Date of Birth: 05-Sep-1926 Referring Provider: Redge Gainer   Encounter Date: 01/29/2018  PT End of Session - 01/29/18 1351    Visit Number  9    Number of Visits  12    Date for PT Re-Evaluation  02/13/18    PT Start Time  1350    PT Stop Time  1430    PT Time Calculation (min)  40 min    Activity Tolerance  Patient tolerated treatment well    Behavior During Therapy  [facility] for tasks assessed/performed       Past Medical History:  Diagnosis Date  . Acid reflux   . Anemia   . Anxiety   . Cataracts, bilateral   . Celiac disease/sprue   . Chronic gastritis   . Esophageal stricture   . Euthyroid   . Gastritis   . Hiatal hernia   . Hyperlipidemia   . Hypermobility syndrome   . 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 Neuropathy   . Osteoporosis   . Palpitations   . Postmenopausal HRT (hormone replacement therapy)   . Shingles   . Thyroid tumor, benign     Past Surgical History:  Procedure Laterality Date  . ABDOMINAL HYSTERECTOMY    . APPENDECTOMY  1935  . CATARACT EXTRACTION Bilateral   . COLONOSCOPY    . ESOPHAGOGASTRODUODENOSCOPY    . EYE SURGERY    . MYOMECTOMY    . PARTIAL HYSTERECTOMY  1974   w 1/2 right ovary   . RETINAL LASER PROCEDURE    . SIGMOIDOSCOPY    . THYROIDECTOMY  1975   rt. benign tumor     There were no vitals filed for this visit.  Subjective Assessment - 01/29/18 1350    Subjective  No new complaints.    Pertinent History  Osteoporosis, HTN, Fall risk    Limitations  Walking;Standing;House hold activities;Other (comment)    Patient Stated Goals  be more stable and walk around neighborhood block independently.    Currently in Pain?  No/denies         [facility] PT Assessment - 01/29/18 0001      Assessment    Medical Diagnosis  Gait instability, general weakness    Onset Date/Surgical Date  10/26/18    Next MD Visit  June 2019    Prior Therapy  yes, home PT      Precautions   Precautions  Fall                  [facility] Adult PT Treatment/Exercise - 01/29/18 0001      Transfers   Five time sit to stand comments   13 sec with no UE assist      Knee/Hip Exercises: Aerobic   Nustep  L5 x15 min      Knee/Hip Exercises: Machines for Strengthening   Cybex Knee Extension  10# 3x10 reps    Cybex Knee Flexion  30# 3x10 reps      Knee/Hip Exercises: Standing   Forward Step Up  Both;15 reps;Hand Hold: 1;Step Height: 6"    Other Standing Knee Exercises  Sidestepping in // bars with red theraband x4 reps      Knee/Hip Exercises: Seated   Sit to Sand  15 reps;without UE support  Balance Exercises - 01/29/18 1427      Balance Exercises: Standing   SLS  Eyes open;Solid surface;Upper extremity support 1;Upper extremity support 2;2 reps;30 secs    Other Standing Exercises  DLS on BOSU x3 min             PT Long Term Goals - 01/29/18 1407      PT LONG TERM GOAL #1   Title  Patient will improve SLS to 5 seconds bilaterally for greater stability during walking and getting in and out of tub.    Time  6    Period  Weeks    Status  On-going      PT LONG TERM GOAL #2   Title  Patient will improve Berg Balance scale to 45/56 to reduce her risk of falls.    Time  6    Period  Weeks    Status  On-going      PT LONG TERM GOAL #3   Title  Patient will improve bilateral LE strength as seen by ability to perform 5x sit to stand without UE support in less than 20 seconds.    Time  6    Period  Weeks    Status  Achieved 13 sec 01/29/2018      PT LONG TERM GOAL #4   Title  Patient will report ability to walk around block with least restrictive AD or no AD with confidence/no fear of falling and no assistance of neighbor.    Time  6    Period  Weeks    Status  On-going             Plan - 01/29/18 1437    Clinical Impression Statement  Patient tolerated today's treatment well as she was able to assess goals with improvements noted. Patient able to confidently complete 5 sit to stands in 13 sec. Patient also able to complete all exercises without reports of pain or fatigue. Patient's BLE SLS improving as she was able to reduce UE assist from two to one UE assist.     Rehab Potential  Good    PT Frequency  2x / week    PT Duration  6 weeks    PT Treatment/Interventions  ADLs/Self Care Home Management;Electrical Stimulation;Therapeutic exercise;Therapeutic activities;Stair training;Gait training;Moist Heat;Patient/family education;Energy conservation;Neuromuscular re-education;Balance training    PT Next Visit Plan  Assess BERG next treatment.    Consulted and Agree with Plan of Care  Patient       Patient will benefit from skilled therapeutic intervention in order to improve the following deficits and impairments:  Abnormal gait, Decreased activity tolerance, Decreased endurance, Decreased strength, Decreased balance  Visit Diagnosis: Muscle weakness (generalized)  Unsteadiness on feet     Problem List Patient Active Problem List   Diagnosis Date Noted  . Uncontrolled hypertension 10/28/2017  . Facial contusion, initial encounter   . Generalized weakness 10/26/2017  . Overactive bladder 06/18/2015  . Hyperlipidemia 09/26/2010  . ANXIETY 09/26/2010  . GERD (gastroesophageal reflux disease) 09/26/2010  . CELIAC SPRUE 09/26/2010  . Osteoporosis 09/26/2010    Standley Brooking, PTA 01/29/2018, 2:40 PM  [facility] [address] [place], [place], [address] Phone: [phone]   Fax:  [phone]  Name: Kristi Sawyer MRN: [id number] Date of Birth: 04/06/1926

## 2018-02-01 ENCOUNTER — Encounter: Payer: Self-pay | Admitting: Physical Therapy

## 2018-02-01 ENCOUNTER — Ambulatory Visit: Payer: Medicare Other | Admitting: Physical Therapy

## 2018-02-01 DIAGNOSIS — M25551 Pain in right hip: Secondary | ICD-10-CM

## 2018-02-01 DIAGNOSIS — M6281 Muscle weakness (generalized): Secondary | ICD-10-CM

## 2018-02-01 DIAGNOSIS — R2681 Unsteadiness on feet: Secondary | ICD-10-CM | POA: Diagnosis not present

## 2018-02-01 NOTE — Therapy (Signed)
[facility] [place], [place], [address] Phone: [phone]   Fax:  [phone]  Physical Therapy Treatment  Patient Details  Name: Kristi Sawyer MRN: [id number] Date of Birth: [birth year] Referring Provider: Redge Gainer   Encounter Date: 02/01/2018  PT End of Session - 02/01/18 1114    Visit Number  10    Number of Visits  12    Date for PT Re-Evaluation  02/13/18    PT Start Time  1113    PT Stop Time  1200    PT Time Calculation (min)  47 min    Activity Tolerance  Patient tolerated treatment well    Behavior During Therapy  [facility] for tasks assessed/performed       Past Medical History:  Diagnosis Date  . Acid reflux   . Anemia   . Anxiety   . Cataracts, bilateral   . Celiac disease/sprue   . Chronic gastritis   . Esophageal stricture   . Euthyroid   . Gastritis   . Hiatal hernia   . Hyperlipidemia   . Hypermobility syndrome   . 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 Neuropathy   . Osteoporosis   . Palpitations   . Postmenopausal HRT (hormone replacement therapy)   . Shingles   . Thyroid tumor, benign     Past Surgical History:  Procedure Laterality Date  . ABDOMINAL HYSTERECTOMY    . APPENDECTOMY  1935  . CATARACT EXTRACTION Bilateral   . COLONOSCOPY    . ESOPHAGOGASTRODUODENOSCOPY    . EYE SURGERY    . MYOMECTOMY    . PARTIAL HYSTERECTOMY  1974   w 1/2 right ovary   . RETINAL LASER PROCEDURE    . SIGMOIDOSCOPY    . THYROIDECTOMY  1975   rt. benign tumor     There were no vitals filed for this visit.  Subjective Assessment - 02/01/18 1114    Subjective  No new complaints.    Pertinent History  Osteoporosis, HTN, Fall risk    Limitations  Walking;Standing;House hold activities;Other (comment)    Patient Stated Goals  be more stable and walk around neighborhood block independently.    Currently in Pain?  No/denies         [facility] PT Assessment - 02/01/18 0001      Assessment    Medical Diagnosis  Gait instability, general weakness    Onset Date/Surgical Date  10/26/18    Next MD Visit  June 2019    Prior Therapy  yes, home PT      Precautions   Precautions  Fall      Berg Balance Test   Sit to Stand  Able to stand without using hands and stabilize independently    Standing Unsupported  Able to stand safely 2 minutes    Sitting with Back Unsupported but Feet Supported on Floor or Stool  Able to sit safely and securely 2 minutes    Stand to Sit  Sits safely with minimal use of hands    Transfers  Able to transfer safely, minor use of hands            No data recorded            Balance Exercises - 02/01/18 1122      Balance Exercises: Standing   SLS  Eyes open;Solid surface;30 secs    Step Ups  Forward;6 inch;UE support 1    Other Standing Exercises  DLS  on BOSU x3 min with front/back and side/side weight shifting             PT Long Term Goals - 01/29/18 1407      PT LONG TERM GOAL #1   Title  Patient will improve SLS to 5 seconds bilaterally for greater stability during walking and getting in and out of tub.    Time  6    Period  Weeks    Status  On-going      PT LONG TERM GOAL #2   Title  Patient will improve Berg Balance scale to 45/56 to reduce her risk of falls.    Time  6    Period  Weeks    Status  On-going      PT LONG TERM GOAL #3   Title  Patient will improve bilateral LE strength as seen by ability to perform 5x sit to stand without UE support in less than 20 seconds.    Time  6    Period  Weeks    Status  Achieved 13 sec 01/29/2018      PT LONG TERM GOAL #4   Title  Patient will report ability to walk around block with least restrictive AD or no AD with confidence/no fear of falling and no assistance of neighbor.    Time  6    Period  Weeks    Status  On-going            Plan - 02/01/18 1116    Clinical Impression Statement  Patient tolerating treatment well today. Pt still progressing with  balance working toward goals set. Continue skilled PT as pt tolerates to progress toward LTG's.     Rehab Potential  Good    PT Frequency  2x / week    PT Treatment/Interventions  ADLs/Self Care Home Management;Electrical Stimulation;Therapeutic exercise;Therapeutic activities;Stair training;Gait training;Moist Heat;Patient/family education;Energy conservation;Neuromuscular re-education;Balance training    PT Next Visit Plan  balance exercises    Consulted and Agree with Plan of Care  Patient       Patient will benefit from skilled therapeutic intervention in order to improve the following deficits and impairments:  Abnormal gait, Decreased activity tolerance, Decreased endurance, Decreased strength, Decreased balance  Visit Diagnosis: Muscle weakness (generalized)  Unsteadiness on feet  Pain in right hip     Problem List Patient Active Problem List   Diagnosis Date Noted  . Uncontrolled hypertension 10/28/2017  . Facial contusion, initial encounter   . Generalized weakness 10/26/2017  . Overactive bladder 06/18/2015  . Hyperlipidemia 09/26/2010  . ANXIETY 09/26/2010  . GERD (gastroesophageal reflux disease) 09/26/2010  . CELIAC SPRUE 09/26/2010  . Osteoporosis 09/26/2010    Oretha Caprice, MPT 02/01/2018, 11:25 AM  [facility] [address] [place], [place], [address] Phone: [phone]   Fax:  [phone]  Name: Kristi Sawyer MRN: [id number] Date of Birth: 1925-12-23

## 2018-02-05 ENCOUNTER — Ambulatory Visit: Payer: Medicare Other | Admitting: *Deleted

## 2018-02-05 DIAGNOSIS — M6281 Muscle weakness (generalized): Secondary | ICD-10-CM | POA: Diagnosis not present

## 2018-02-05 DIAGNOSIS — R2681 Unsteadiness on feet: Secondary | ICD-10-CM | POA: Diagnosis not present

## 2018-02-05 DIAGNOSIS — M25551 Pain in right hip: Secondary | ICD-10-CM

## 2018-02-05 NOTE — Therapy (Signed)
[facility] [place], [place], [address] Phone: [phone]   Fax:  [phone]  Physical Therapy Treatment  Patient Details  Name: Kristi Sawyer MRN: [id number] Date of Birth: 1925-12-17 Referring Provider: Redge Gainer   Encounter Date: 02/05/2018  PT End of Session - 02/05/18 1735    Visit Number  11    Number of Visits  12    Date for PT Re-Evaluation  02/13/18    PT Start Time  1115    PT Stop Time  7322    PT Time Calculation (min)  50 min       Past Medical History:  Diagnosis Date  . Acid reflux   . Anemia   . Anxiety   . Cataracts, bilateral   . Celiac disease/sprue   . Chronic gastritis   . Esophageal stricture   . Euthyroid   . Gastritis   . Hiatal hernia   . Hyperlipidemia   . Hypermobility syndrome   . 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 Neuropathy   . Osteoporosis   . Palpitations   . Postmenopausal HRT (hormone replacement therapy)   . Shingles   . Thyroid tumor, benign     Past Surgical History:  Procedure Laterality Date  . ABDOMINAL HYSTERECTOMY    . APPENDECTOMY  1935  . CATARACT EXTRACTION Bilateral   . COLONOSCOPY    . ESOPHAGOGASTRODUODENOSCOPY    . EYE SURGERY    . MYOMECTOMY    . PARTIAL HYSTERECTOMY  1974   w 1/2 right ovary   . RETINAL LASER PROCEDURE    . SIGMOIDOSCOPY    . THYROIDECTOMY  1975   rt. benign tumor     There were no vitals filed for this visit.  Subjective Assessment - 02/05/18 1133    Subjective  No new complaints.    Pertinent History  Osteoporosis, HTN, Fall risk    Limitations  Walking;Standing;House hold activities;Other (comment)    Patient Stated Goals  be more stable and walk around neighborhood block independently.                No data recorded       [facility] Adult PT Treatment/Exercise - 02/05/18 0001      Exercises   Exercises  Knee/Hip  (Pended)       Knee/Hip Exercises: Aerobic   Nustep  L5 x15 min   (Pended)       Knee/Hip Exercises: Machines for Strengthening   Cybex Knee Extension  10# 3x10 reps  (Pended)     Cybex Knee Flexion  # 20x10 reps  (Pended)       Knee/Hip Exercises: Standing   Forward Step Up  Both;15 reps;Hand Hold: 1;Step Height: 6"  (Pended)     Other Standing Knee Exercises  Sidestepping in // bars with red theraband x4 reps  (Pended)       Knee/Hip Exercises: Seated   Sit to Sand  20 reps;without UE support  (Pended)                   PT Long Term Goals - 02/01/18 1205      PT LONG TERM GOAL #1   Title  Patient will improve SLS to 5 seconds bilaterally for greater stability during walking and getting in and out of tub.    Status  On-going      PT LONG TERM GOAL #2   Title  Patient will improve Merrilee Jansky  Balance scale to 45/56 to reduce her risk of falls.    Baseline  BERG 45/56 02/01/18    Time  6    Period  Weeks    Status  On-going      PT LONG TERM GOAL #3   Title  Patient will improve bilateral LE strength as seen by ability to perform 5x sit to stand without UE support in less than 20 seconds.    Time  6    Period  Weeks    Status  Achieved      PT LONG TERM GOAL #4   Title  Patient will report ability to walk around block with least restrictive AD or no AD with confidence/no fear of falling and no assistance of neighbor.    Time  6    Period  Weeks    Status  On-going            Plan - 02/05/18 1742    Clinical Impression Statement  pt arrived to clinic today doing fairly well. She was able to complete all therex for strengthening and balance acts. She reports that she can tell she is improving with some ADLs getting easier and going from sit to stand has really improved.    Clinical Presentation  Stable    Clinical Decision Making  Low    Rehab Potential  Good    PT Frequency  2x / week    PT Duration  6 weeks    PT Treatment/Interventions  ADLs/Self Care Home Management;Electrical Stimulation;Therapeutic exercise;Therapeutic  activities;Stair training;Gait training;Moist Heat;Patient/family education;Energy conservation;Neuromuscular re-education;Balance training    PT Next Visit Plan  balance exercises    Consulted and Agree with Plan of Care  Patient       Patient will benefit from skilled therapeutic intervention in order to improve the following deficits and impairments:  Abnormal gait, Decreased activity tolerance, Decreased endurance, Decreased strength, Decreased balance  Visit Diagnosis: Muscle weakness (generalized)  Unsteadiness on feet  Pain in right hip     Problem List Patient Active Problem List   Diagnosis Date Noted  . Uncontrolled hypertension 10/28/2017  . Facial contusion, initial encounter   . Generalized weakness 10/26/2017  . Overactive bladder 06/18/2015  . Hyperlipidemia 09/26/2010  . ANXIETY 09/26/2010  . GERD (gastroesophageal reflux disease) 09/26/2010  . CELIAC SPRUE 09/26/2010  . Osteoporosis 09/26/2010    Kristi Sawyer,CHRIS, PTA 02/05/2018, 5:46 PM  [facility] [place], [place], [address] Phone: [phone]   Fax:  [phone]  Name: Kristi Sawyer MRN: [id number] Date of Birth: [birth year]

## 2018-02-08 ENCOUNTER — Encounter: Payer: Self-pay | Admitting: Physical Therapy

## 2018-02-08 ENCOUNTER — Ambulatory Visit: Payer: Medicare Other | Admitting: Physical Therapy

## 2018-02-08 DIAGNOSIS — R2681 Unsteadiness on feet: Secondary | ICD-10-CM | POA: Diagnosis not present

## 2018-02-08 DIAGNOSIS — M6281 Muscle weakness (generalized): Secondary | ICD-10-CM | POA: Diagnosis not present

## 2018-02-08 DIAGNOSIS — M25551 Pain in right hip: Secondary | ICD-10-CM | POA: Diagnosis not present

## 2018-02-08 NOTE — Therapy (Signed)
[facility] [place], [place], [address] Phone: [phone]   Fax:  [phone]  Physical Therapy Treatment  Patient Details  Name: Kristi Sawyer MRN: [id number] Date of Birth: Jun 24, 1926 Referring Provider: Redge Gainer   Encounter Date: 02/08/2018  PT End of Session - 02/08/18 1125    Visit Number  12    Number of Visits  12    Date for PT Re-Evaluation  02/13/18    PT Start Time  5176    PT Stop Time  1156    PT Time Calculation (min)  33 min    Activity Tolerance  Patient tolerated treatment well    Behavior During Therapy  [facility] for tasks assessed/performed       Past Medical History:  Diagnosis Date  . Acid reflux   . Anemia   . Anxiety   . Cataracts, bilateral   . Celiac disease/sprue   . Chronic gastritis   . Esophageal stricture   . Euthyroid   . Gastritis   . Hiatal hernia   . Hyperlipidemia   . Hypermobility syndrome   . 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 Neuropathy   . Osteoporosis   . Palpitations   . Postmenopausal HRT (hormone replacement therapy)   . Shingles   . Thyroid tumor, benign     Past Surgical History:  Procedure Laterality Date  . ABDOMINAL HYSTERECTOMY    . APPENDECTOMY  1935  . CATARACT EXTRACTION Bilateral   . COLONOSCOPY    . ESOPHAGOGASTRODUODENOSCOPY    . EYE SURGERY    . MYOMECTOMY    . PARTIAL HYSTERECTOMY  1974   w 1/2 right ovary   . RETINAL LASER PROCEDURE    . SIGMOIDOSCOPY    . THYROIDECTOMY  1975   rt. benign tumor     There were no vitals filed for this visit.  Subjective Assessment - 02/08/18 1124    Subjective  No new complaints and reports today is D/C day. Patient reports that she feels more confident within her home and walking to her car as well as in the grocery store as she has a cart but still is weary of ambulating without a friend in her neightborhood.    Pertinent History  Osteoporosis, HTN, Fall risk    Limitations   Walking;Standing;House hold activities;Other (comment)    Patient Stated Goals  be more stable and walk around neighborhood block independently.    Currently in Pain?  No/denies         [facility] PT Assessment - 02/08/18 0001      Assessment   Medical Diagnosis  Gait instability, general weakness    Onset Date/Surgical Date  10/26/18    Next MD Visit  June 2019    Prior Therapy  yes, home PT      Precautions   Precautions  Fall            No data recorded       [facility] Adult PT Treatment/Exercise - 02/08/18 0001      Standardized Balance Assessment   Standardized Balance Assessment  Berg Balance Test      Berg Balance Test   Sit to Stand  Able to stand without using hands and stabilize independently    Standing Unsupported  Able to stand safely 2 minutes    Sitting with Back Unsupported but Feet Supported on Floor or Stool  Able to sit safely and securely 2 minutes  Stand to Sit  Sits safely with minimal use of hands    Transfers  Able to transfer safely, minor use of hands    Standing Unsupported with Eyes Closed  Able to stand 10 seconds safely    Standing Ubsupported with Feet Together  Able to place feet together independently and stand 1 minute safely    From Standing, Reach Forward with Outstretched Arm  Can reach forward >12 cm safely (5")    From Standing Position, Pick up Object from Floor  Able to pick up shoe, needs supervision    From Standing Position, Turn to Look Behind Over each Shoulder  Looks behind from both sides and weight shifts well    Turn 360 Degrees  Able to turn 360 degrees safely but slowly    Standing Unsupported, Alternately Place Feet on Step/Stool  Able to stand independently and complete 8 steps >20 seconds    Standing Unsupported, One Foot in Front  Able to plae foot ahead of the other independently and hold 30 seconds    Standing on One Leg  Able to lift leg independently and hold > 10 seconds    Total Score  50      Knee/Hip  Exercises: Aerobic   Nustep  L5 x15 min      Knee/Hip Exercises: Standing   SLS  RLE- 10 sec, LLE x10 sec                  PT Long Term Goals - 02/08/18 1138      PT LONG TERM GOAL #1   Title  Patient will improve SLS to 5 seconds bilaterally for greater stability during walking and getting in and out of tub.    Status  Achieved      PT LONG TERM GOAL #2   Title  Patient will improve Berg Balance scale to 45/56 to reduce her risk of falls.    Baseline  BERG 45/56 02/01/18    Time  6    Period  Weeks    Status  Achieved      PT LONG TERM GOAL #3   Title  Patient will improve bilateral LE strength as seen by ability to perform 5x sit to stand without UE support in less than 20 seconds.    Time  6    Period  Weeks    Status  Achieved      PT LONG TERM GOAL #4   Title  Patient will report ability to walk around block with least restrictive AD or no AD with confidence/no fear of falling and no assistance of neighbor.    Time  6    Period  Weeks    Status  Not Met Patient still fearful of walking by herself 02/08/2018            Plan - 02/08/18 1200    Clinical Impression Statement  Patient has progressed well in PT and has improved greatly in regards to balance activities. Patient still very cautious and controlled with movements and continues to report fear of fear/lack of confidence with ambulation within her neighborhood without a friend. BERG assessed today with improvement to 50/56. Patient has achieved all goals set at evaluation except for ambulation within community goal.        Patient will benefit from skilled therapeutic intervention in order to improve the following deficits and impairments:     Visit Diagnosis: Muscle weakness (generalized)  Unsteadiness on feet  Problem List Patient Active Problem List   Diagnosis Date Noted  . Uncontrolled hypertension 10/28/2017  . Facial contusion, initial encounter   . Generalized weakness  10/26/2017  . Overactive bladder 06/18/2015  . Hyperlipidemia 09/26/2010  . ANXIETY 09/26/2010  . GERD (gastroesophageal reflux disease) 09/26/2010  . CELIAC SPRUE 09/26/2010  . Osteoporosis 09/26/2010    Standley Brooking, PTA 02/08/18 12:09 PM   [facility] [address] [place], [place], [address] Phone: [phone]   Fax:  [phone]  Name: MADDOX BRATCHER MRN: [id number] Date of Birth: 1926-01-20  PHYSICAL THERAPY DISCHARGE SUMMARY  Visits from Start of Care: 12.  Current functional level related to goals / functional outcomes: See above.   Remaining deficits: LTG #4 unmet.   Education / Equipment: HEP. Plan: Patient agrees to discharge.  Patient goals were partially met. Patient is being discharged due to being pleased with the current functional level.  ?????        Mali Applegate MPT

## 2018-02-09 ENCOUNTER — Other Ambulatory Visit: Payer: Self-pay | Admitting: Family Medicine

## 2018-02-15 ENCOUNTER — Other Ambulatory Visit: Payer: Self-pay | Admitting: Internal Medicine

## 2018-02-15 NOTE — Telephone Encounter (Signed)
Okay to refill Sir, had labs in Feb.

## 2018-02-15 NOTE — Telephone Encounter (Signed)
refill x 1 year

## 2018-03-06 ENCOUNTER — Encounter: Payer: Self-pay | Admitting: *Deleted

## 2018-03-27 ENCOUNTER — Ambulatory Visit (INDEPENDENT_AMBULATORY_CARE_PROVIDER_SITE_OTHER): Payer: Medicare Other | Admitting: Internal Medicine

## 2018-03-27 ENCOUNTER — Encounter: Payer: Self-pay | Admitting: Internal Medicine

## 2018-03-27 VITALS — BP 124/68 | HR 70 | Ht 60.0 in | Wt 103.4 lb

## 2018-03-27 DIAGNOSIS — K219 Gastro-esophageal reflux disease without esophagitis: Secondary | ICD-10-CM | POA: Diagnosis not present

## 2018-03-27 DIAGNOSIS — K900 Celiac disease: Secondary | ICD-10-CM | POA: Diagnosis not present

## 2018-03-27 NOTE — Progress Notes (Signed)
Kristi Sawyer 84 y.o. [birth year] [id number]  Assessment & Plan:   Encounter Diagnoses  Name Primary?  . Gastroesophageal reflux disease, esophagitis presence not specified Yes  . Celiac disease      She will continue her omeprazole PPI and 10 mg nocturnal metoclopramide, continue her gluten-free diet and see me in 2 years if needed.  She receives regular primary care follow-up as well.  No signs of tardive dyskinesia or problems from low-dose chronic bedtime metoclopramide.  I appreciate the opportunity to care for this patient. CC: Chipper Herb, MD  Subjective:   Chief Complaint:  HPI The patient is here for follow-up at 2 years, she has reflux disease and a history of celiac disease and really has no complaints and is doing remarkably well for 84 years of age. Allergies  Allergen Reactions  . Prevnar 13 [Pneumococcal 13-Val Conj Vacc] Hives    Red/streaking arm  . Penicillins Hives    Has patient had a PCN reaction causing immediate rash, facial/tongue/throat swelling, SOB or lightheadedness with hypotension: No Has patient had a PCN reaction causing severe rash involving mucus membranes or skin necrosis: No Has patient had a PCN reaction that required hospitalization: No Has patient had a PCN reaction occurring within the last 10 years: No If all of the above answers are "NO", then may proceed with Cephalosporin use.   . Tetanus Toxoids Other (See Comments)    Area of injection was red and streaked.  . Codeine Nausea Only  . Sulfonamide Derivatives Nausea Only   Current Meds  Medication Sig  . aspirin (SB LOW DOSE ASA EC) 81 MG EC tablet Take 81 mg by mouth daily.    . Calcium Carbonate-Vit D-Min (CALTRATE PLUS PO) Take 1 tablet by mouth daily.   . Cholecalciferol (VITAMIN D) 2000 UNITS CAPS Take by mouth as directed. Take 1 tablet daily except none on saturdays and sundays  . Fe Fum-FePoly-Vit C-Vit B3 (INTEGRA) 62.5-62.5-40-3 MG CAPS TAKE 1 CAPSULE BY MOUTH  DAILY.  Marland Kitchen Inulin (FIBER CHOICE FRUITY BITES) 1.5 g CHEW Chew 5 tablets by mouth daily. FIBER CHOICE  . metoCLOPramide (REGLAN) 10 MG tablet TAKE 1 TABLET BY MOUTH AT BEDTIME  . mirabegron ER (MYRBETRIQ) 50 MG TB24 tablet Take 1 tablet (50 mg total) by mouth daily.  . Multiple Vitamins-Minerals (CENTRUM SILVER PO) Take 1 tablet by mouth daily.   . Omega-3 Fatty Acids (FISH OIL) 1000 MG CAPS Take 1 capsule by mouth 3 (three) times daily.   Marland Kitchen omeprazole (PRILOSEC) 20 MG capsule TAKE 1-2 CAPSULES (20-40 MG TOTAL) BY MOUTH DAILY.  Vladimir Faster Glycol-Propyl Glycol (SYSTANE OP) Apply 1-2 drops to eye daily as needed (FOR DRY EYE).   . rosuvastatin (CRESTOR) 5 MG tablet TAKE 1 TABLET ON MON, WED, FRIDAY  . rosuvastatin (CRESTOR) 5 MG tablet TAKE 1 TABLET ON MON, WED, FRIDAY   Past Medical History:  Diagnosis Date  . Acid reflux   . Anemia   . Anxiety   . Cataracts, bilateral   . Celiac disease/sprue   . Chronic gastritis   . Esophageal stricture   . Euthyroid   . Gastritis   . Hiatal hernia   . Hyperlipidemia   . Hypermobility syndrome   . 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 Neuropathy   . Osteoporosis   . Palpitations   . Postmenopausal HRT (hormone replacement therapy)   . Shingles   . Thyroid tumor, benign    Past Surgical  History:  Procedure Laterality Date  . ABDOMINAL HYSTERECTOMY    . APPENDECTOMY  1935  . CATARACT EXTRACTION Bilateral   . COLONOSCOPY    . ESOPHAGOGASTRODUODENOSCOPY    . EYE SURGERY    . MYOMECTOMY    . PARTIAL HYSTERECTOMY  1974   w 1/2 right ovary   . RETINAL LASER PROCEDURE    . SIGMOIDOSCOPY    . THYROIDECTOMY  1975   rt. 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

## 2018-03-27 NOTE — Patient Instructions (Addendum)
   I am glad to see that you are doing well.  Please call me if you need me or have a ?  I appreciate the opportunity to care for you. Gatha Mayer, MD, [facility]

## 2018-03-30 ENCOUNTER — Encounter: Payer: Self-pay | Admitting: Internal Medicine

## 2018-04-16 ENCOUNTER — Ambulatory Visit: Payer: Medicare Other | Admitting: *Deleted

## 2018-04-17 ENCOUNTER — Other Ambulatory Visit: Payer: Self-pay | Admitting: Internal Medicine

## 2018-04-29 ENCOUNTER — Ambulatory Visit: Payer: Medicare Other | Admitting: Family Medicine

## 2018-04-30 ENCOUNTER — Ambulatory Visit: Payer: Medicare Other | Admitting: Family Medicine

## 2018-05-01 ENCOUNTER — Encounter: Payer: Self-pay | Admitting: *Deleted

## 2018-05-01 ENCOUNTER — Ambulatory Visit (INDEPENDENT_AMBULATORY_CARE_PROVIDER_SITE_OTHER): Payer: Medicare Other | Admitting: *Deleted

## 2018-05-01 VITALS — BP 102/72 | HR 77 | Ht 60.0 in | Wt 102.0 lb

## 2018-05-01 DIAGNOSIS — Z0000 Encounter for general adult medical examination without abnormal findings: Secondary | ICD-10-CM

## 2018-05-01 NOTE — Progress Notes (Addendum)
Subjective:   Kristi Sawyer is a 84 y.o. female who presents for a Medicare Annual Wellness Visit. Kristi Sawyer lives at home alone. Her husband passed away about 16 years ago. She does not have any children or siblings. She is very close with her friends at church and has a neighbor that is a Marine scientist that frequently checks in on her.  She organizes a lunch outing every week and organizes monthly birthday parties for her group at church. She was walking around the block daily for years but since having an episode of hypertension and weakness at the end of last year, she has cut back and only walks about three times a week now when there is someone available to walk with her. She has a life alert necklace that she wears at all times, except when she is bathing, and it works inside and outside of the home.     Review of Systems    Patient reports that her overall health is better compared to last year.  Cardiac Risk Factors include: advanced age (>15mn, >>56women);dyslipidemia;hypertension  Cardio: episode of hypertension and weakness at the end of last year. She was in the bath tub and was unable to get out. Took 45 minutes to finally crawl out and call for help. She was hospitalized for a few days and discharged back home.   All other systems negative   Current Medications (verified) Outpatient Encounter Medications as of 05/01/2018  Medication Sig  . amLODipine (NORVASC) 5 MG tablet Take 5 mg by mouth daily.  .Marland Kitchenaspirin (SB LOW DOSE ASA EC) 81 MG EC tablet Take 81 mg by mouth daily.    . Calcium Carbonate-Vit D-Min (CALTRATE PLUS PO) Take 1 tablet by mouth daily.   . Cholecalciferol (VITAMIN D) 2000 UNITS CAPS Take by mouth as directed. Take 1 tablet daily except none on saturdays and sundays  . Fe Fum-FePoly-Vit C-Vit B3 (INTEGRA) 62.5-62.5-40-3 MG CAPS TAKE 1 CAPSULE BY MOUTH DAILY.  . hydrALAZINE (APRESOLINE) 10 MG tablet TAKE 1 TABLET (10 MG TOTAL) BY MOUTH EVERY 8 (EIGHT) HOURS.  .Marland KitchenInulin  (FIBER CHOICE FRUITY BITES) 1.5 g CHEW Chew 5 tablets by mouth daily. FIBER CHOICE  . metoCLOPramide (REGLAN) 10 MG tablet TAKE 1 TABLET BY MOUTH EVERYDAY AT BEDTIME  . mirabegron ER (MYRBETRIQ) 50 MG TB24 tablet Take 1 tablet (50 mg total) by mouth daily.  . Multiple Vitamins-Minerals (CENTRUM SILVER PO) Take 1 tablet by mouth daily.   . Omega-3 Fatty Acids (FISH OIL) 1000 MG CAPS Take 1 capsule by mouth 3 (three) times daily.   .Marland Kitchenomeprazole (PRILOSEC) 20 MG capsule TAKE 1-2 CAPSULES (20-40 MG TOTAL) BY MOUTH DAILY.  .Kristi Sawyer (SYSTANE OP) Apply 1-2 drops to eye daily as needed (FOR DRY EYE).   . rosuvastatin (CRESTOR) 5 MG tablet TAKE 1 TABLET ON MON, WED, FRIDAY  . rosuvastatin (CRESTOR) 5 MG tablet TAKE 1 TABLET ON MON, WED, FRIDAY   No facility-administered encounter medications on file as of 05/01/2018.     Allergies (verified) Prevnar 13 [pneumococcal 13-val conj vacc]; Penicillins; Tetanus toxoids; Codeine; and Sulfonamide derivatives   History: Past Medical History:  Diagnosis Date  . Acid reflux   . Anemia   . Anxiety   . Cataracts, bilateral   . Celiac disease/sprue   . Chronic gastritis   . Esophageal stricture   . Euthyroid   . Gastritis   . Hiatal hernia   . Hyperlipidemia   .  Hypermobility syndrome   . 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 Neuropathy   . Osteoporosis   . Palpitations   . Postmenopausal HRT (hormone replacement therapy)   . Shingles   . Thyroid tumor, benign    Past Surgical History:  Procedure Laterality Date  . ABDOMINAL HYSTERECTOMY    . APPENDECTOMY  1935  . CATARACT EXTRACTION Bilateral   . COLONOSCOPY    . ESOPHAGOGASTRODUODENOSCOPY    . EYE SURGERY    . MYOMECTOMY    . PARTIAL HYSTERECTOMY  1974   w 1/2 right ovary   . RETINAL LASER PROCEDURE    . SIGMOIDOSCOPY    . THYROIDECTOMY  1975   rt. benign tumor    Family History  Problem Relation Age of Onset  . Healthy Mother   . Lung cancer  Father   . Colon cancer Neg Hx   . Stomach cancer Neg Hx   . Esophageal cancer Neg Hx    Social History   Socioeconomic History  . Marital status: Widowed    Spouse name: Not on file  . Number of children: 0  . Years of education: 16  . Highest education level: Bachelor's degree (e.g., BA, AB, BS)  Occupational History  . Occupation: retired    Fish farm manager: RETIRED  Social Needs  . Financial resource strain: Not very hard  . Food insecurity:    Worry: Never true    Inability: Never true  . Transportation needs:    Medical: No    Non-medical: No  Tobacco Use  . Smoking status: Never Smoker  . Smokeless tobacco: Never Used  Substance and Sexual Activity  . Alcohol use: No  . Drug use: No  . Sexual activity: Not Currently  Lifestyle  . Physical activity:    Days per week: 3 days    Minutes per session: 10 min  . Stress: Only a little  Relationships  . Social connections:    Talks on phone: More than three times a week    Gets together: More than three times a week    Attends religious service: More than 4 times per year    Active member of club or organization: Yes    Attends meetings of clubs or organizations: More than 4 times per year    Relationship status: Widowed  Other Topics Concern  . 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  Clinical Intake:  Pre-visit preparation completed: No  Pain : No/denies pain     Nutritional Status: BMI of 19-24  Normal Diabetes: No  How often do you need to have someone help you when you read instructions, pamphlets, or other written materials from your doctor or pharmacy?: 1 - Never What is the last grade level you completed in school?: bachelor's degree  Interpreter Needed?: No  Information entered by :: Chong Sicilian, RN   Activities of Daily Living In your present state of health,  do you have any difficulty performing the following activities: 05/01/2018 10/27/2017  Hearing? N N  Vision? N N  Comment Has yearly eye exams -  Difficulty concentrating or making decisions? N N  Walking or climbing stairs? N Y  Dressing or bathing? N N  Doing errands, shopping? N N  Preparing Food and eating ?  N -  Using the Toilet? N -  In the past six months, have you accidently leaked urine? N -  Do you have problems with loss of bowel control? N -  Managing your Medications? N -  Managing your Finances? N -  Housekeeping or managing your Housekeeping? N -  Some recent data might be hidden   Diet Eats at home mostly and eats out a couple of times a week. Mainly eats frozen meals.   Exercise Current Exercise Habits: Home exercise routine, Type of exercise: walking, Time (Minutes): 15, Frequency (Times/Week): 3, Weekly Exercise (Minutes/Week): 45, Intensity: Mild, Exercise limited by: Other - see comments(age and concern about balance)   Depression Screen PHQ 2/9 Scores 05/01/2018 12/26/2017 11/02/2017 08/07/2017 04/16/2017 03/28/2017 11/23/2016  PHQ - 2 Score 0 0 0 0 0 0 0     Fall Risk Fall Risk  05/01/2018 12/26/2017 11/02/2017 08/07/2017 04/16/2017  Falls in the past year? No Yes Yes No No  Number falls in past yr: - 1 1 - -  Injury with Fall? - Yes Yes - -  Risk Factor Category  - - High Fall Risk - -  Risk for fall due to : - - - - -  Follow up - - Falls prevention discussed - -    Safety Is the patient's home free of loose throw rugs in walkways, pet beds, electrical cords, etc?   yes      Grab bars in the bathroom? yes      Walkin shower? no      Shower Seat? yes      Handrails on the stairs?   yes      Adequate lighting?   yes  Patient Care Team: Chipper Herb, MD as PCP - General Carlean Purl Ofilia Neas, MD as Consulting Physician (Gastroenterology) Zadie Rhine Clent Demark, MD as Consulting Physician (Ophthalmology)   ED to admission 10/26/17 for generalized weakness. No other  surgeries, ER visits, or hospitalizations.   Objective:    Today's Vitals   05/01/18 1126  BP: 102/72  Pulse: 77  Weight: 102 lb (46.3 kg)  Height: 5' (1.524 m)   Body mass index is 19.92 kg/m.  Advanced Directives 05/01/2018 02/01/2018 10/27/2017 10/26/2017 04/16/2017 07/18/2016 04/14/2016  Does Patient Have a Medical Advance Directive? No No Yes Yes Yes Yes Yes  Type of Advance Directive - - Out of facility DNR (pink MOST or yellow form);Living will;Healthcare Power of [place];Living will [place];Living will - [place];Living will  Does patient want to make changes to medical advance directive? - - No - Patient declined - No - Patient declined - No - Patient declined  Copy of [place] in Chart? - - No - copy requested - Yes - Yes  Would patient like information on creating a medical advance directive? No - Patient declined No - Patient declined - - - - -    Hearing/Vision  normal or No deficits noted during visit.  Cognitive Function: MMSE - Mini Mental State Exam 05/01/2018 04/16/2017 04/14/2016 04/02/2015  Orientation to time 5 5 5 5   Orientation to Place 5 5 5 5   Registration 3 - 3 3  Attention/ Calculation 4 3 5 4   Recall 2 - 3 2  Language- name 2 objects 2 2 2 2   Language- repeat 1 1 1 1   Language- follow 3 step command 2 3 3 3   Language- read & follow direction 1 1  1 1  Write a sentence 1 1 1 1   Copy design 1 1 1 1   Total score 27 - 30 28       Normal Cognitive Function Screening: Yes    Immunizations and Health Maintenance Immunization History  Administered Date(s) Administered  . Influenza, High Dose Seasonal PF 08/07/2017  . Influenza,inj,Quad PF,6+ Mos 08/04/2013, 08/18/2014, 09/16/2015, 08/22/2016  . Pneumococcal Conjugate-13 11/05/2013  . Pneumococcal Polysaccharide-23 11/14/1995   Health Maintenance Due  Topic Date Due  . MAMMOGRAM  04/10/2018   Health Maintenance    Topic Date Due  . MAMMOGRAM  04/10/2018  . DEXA SCAN  12/26/2018 (Originally 10/17/2017)  . INFLUENZA VACCINE  06/13/2018  . TETANUS/TDAP  06/04/2023  . PNA vac Low Risk Adult  Completed        Assessment:   This is a routine wellness examination for Kristi Sawyer.    Plan:    Goals    . Exercise 150 minutes per week (moderate activity)     Great job - keep up daily walking        Health Maintenance Recommendations: mammogram is not required due to age but she has them yearly. She is scheduled this month for a repeat.    Additional Screening Recommendations: Lung: Low Dose CT Chest recommended if Age 61-80 years, 30 pack-year currently smoking OR have quit w/in 15years. Patient does not qualify. Hepatitis C Screening recommended: not applicable  Today's Orders No orders of the defined types were placed in this encounter.   Keep f/u with Chipper Herb, MD and any other specialty appointments you may have Continue current medications Move carefully to avoid falls. Use assistive devices like a can or walker if needed. Aim for at least 150 minutes of moderate activity a week. This can be done with chair exercises if necessary. Read or work on puzzles daily Stay connected with friends and family  I have personally reviewed and noted the following in the patient's chart:   . Medical and social history . Use of alcohol, tobacco or illicit drugs  . Current medications and supplements . Functional ability and status . Nutritional status . Physical activity . Advanced directives . List of other physicians . Hospitalizations, surgeries, and ER visits in previous 12 months . Vitals . Screenings to include cognitive, depression, and falls . Referrals and appointments  In addition, I have reviewed and discussed with patient certain preventive protocols, quality metrics, and best practice recommendations. A written personalized care plan for preventive services as well as  general preventive health recommendations were provided to patient.     Chong Sicilian, RN   05/01/2018   I have reviewed and agree with the above AWV documentation.   Arrie Senate MD

## 2018-05-01 NOTE — Patient Instructions (Signed)
  Ms. Slee , Thank you for taking time to come for your Medicare Wellness Visit. I appreciate your ongoing commitment to your health goals. Please review the following plan we discussed and let me know if I can assist you in the future.   These are the goals we discussed: Goals    . Exercise 150 minutes per week (moderate activity)     Great job - keep up daily walking       This is a list of the screening recommended for you and due dates:  Health Maintenance  Topic Date Due  . Mammogram  04/10/2018  . DEXA scan (bone density measurement)  12/26/2018*  . Flu Shot  06/13/2018  . Tetanus Vaccine  06/04/2023  . Pneumonia vaccines  Completed  *Topic was postponed. The date shown is not the original due date.

## 2018-05-06 DIAGNOSIS — Z1231 Encounter for screening mammogram for malignant neoplasm of breast: Secondary | ICD-10-CM | POA: Diagnosis not present

## 2018-05-10 ENCOUNTER — Encounter: Payer: Self-pay | Admitting: Family Medicine

## 2018-05-10 ENCOUNTER — Ambulatory Visit (INDEPENDENT_AMBULATORY_CARE_PROVIDER_SITE_OTHER): Payer: Medicare Other | Admitting: Family Medicine

## 2018-05-10 VITALS — BP 117/75 | HR 75 | Temp 98.2°F | Ht 60.0 in | Wt 100.0 lb

## 2018-05-10 DIAGNOSIS — E559 Vitamin D deficiency, unspecified: Secondary | ICD-10-CM

## 2018-05-10 DIAGNOSIS — E7800 Pure hypercholesterolemia, unspecified: Secondary | ICD-10-CM | POA: Diagnosis not present

## 2018-05-10 DIAGNOSIS — R2681 Unsteadiness on feet: Secondary | ICD-10-CM | POA: Diagnosis not present

## 2018-05-10 DIAGNOSIS — M4726 Other spondylosis with radiculopathy, lumbar region: Secondary | ICD-10-CM | POA: Diagnosis not present

## 2018-05-10 DIAGNOSIS — K219 Gastro-esophageal reflux disease without esophagitis: Secondary | ICD-10-CM

## 2018-05-10 NOTE — Progress Notes (Signed)
Subjective:    Patient ID: Kristi Sawyer, female    DOB: 1926/03/19, 84 y.o.   MRN: [id number]  HPI Pt here for follow up and management of chronic medical problems which includes hyperlipidemia. She is taking medication regularly.  Patient today complains with some right hip pain.  She will get lab work done.  Her vital signs are stable and her weight is 100 and was 102 previously.  The patient has had a CT scan of her lumbar spine as late as December 2018 and had multilevel degenerative disc disease with disc space narrowing and foraminal stenosis with a curvature.  Patient today denies any chest pain pressure tightness or shortness of breath anymore than usual.  She denies any trouble with her bowel movements or change in bowel habits or nausea vomiting.  She is passing her water well.  She drinks plenty of fluids.  She is walking regularly at least 3 times weekly and has a walking partner.  She tries to walk when she goes to the grocery store.  She gets out who is active at church and visits her former neighbor regularly as she is more homebound.  Since December her activity has increased and she is thankful and pleased with what she can do being almost 84 years old.  She does complain of some hip pain worse on the right side and previous x-rays were reviewed and spine films were reviewed and most likely the hip pain is coming from the osteoarthritis and degenerative disc changes in her lumbar spine.  That x-ray report or CT scan that was done in December 2018 was reviewed with her and she was given a copy of this.    Patient Active Problem List   Diagnosis Date Noted  . Uncontrolled hypertension 10/28/2017  . Facial contusion, initial encounter   . Generalized weakness 10/26/2017  . Overactive bladder 06/18/2015  . Hyperlipidemia 09/26/2010  . ANXIETY 09/26/2010  . GERD (gastroesophageal reflux disease) 09/26/2010  . CELIAC SPRUE 09/26/2010  . Osteoporosis 09/26/2010   Outpatient  Encounter Medications as of 05/10/2018  Medication Sig  . amLODipine (NORVASC) 5 MG tablet Take 5 mg by mouth daily.  Marland Kitchen aspirin (SB LOW DOSE ASA EC) 81 MG EC tablet Take 81 mg by mouth daily.    . Calcium Carbonate-Vit D-Min (CALTRATE PLUS PO) Take 1 tablet by mouth daily.   . Cholecalciferol (VITAMIN D) 2000 UNITS CAPS Take by mouth as directed. Take 1 tablet daily except none on saturdays and sundays  . Fe Fum-FePoly-Vit C-Vit B3 (INTEGRA) 62.5-62.5-40-3 MG CAPS TAKE 1 CAPSULE BY MOUTH DAILY.  . hydrALAZINE (APRESOLINE) 10 MG tablet TAKE 1 TABLET (10 MG TOTAL) BY MOUTH EVERY 8 (EIGHT) HOURS.  Marland Kitchen Inulin (FIBER CHOICE FRUITY BITES) 1.5 g CHEW Chew 5 tablets by mouth daily. FIBER CHOICE  . metoCLOPramide (REGLAN) 10 MG tablet TAKE 1 TABLET BY MOUTH EVERYDAY AT BEDTIME  . mirabegron ER (MYRBETRIQ) 50 MG TB24 tablet Take 1 tablet (50 mg total) by mouth daily.  . Multiple Vitamins-Minerals (CENTRUM SILVER PO) Take 1 tablet by mouth daily.   . Omega-3 Fatty Acids (FISH OIL) 1000 MG CAPS Take 1 capsule by mouth 3 (three) times daily.   Marland Kitchen omeprazole (PRILOSEC) 20 MG capsule TAKE 1-2 CAPSULES (20-40 MG TOTAL) BY MOUTH DAILY.  Vladimir Faster Glycol-Propyl Glycol (SYSTANE OP) Apply 1-2 drops to eye daily as needed (FOR DRY EYE).   . rosuvastatin (CRESTOR) 5 MG tablet TAKE 1 TABLET ON MON, WED, FRIDAY  . [  DISCONTINUED] rosuvastatin (CRESTOR) 5 MG tablet TAKE 1 TABLET ON MON, WED, FRIDAY   No facility-administered encounter medications on file as of 05/10/2018.      Review of Systems  Constitutional: Negative.   HENT: Negative.   Eyes: Negative.   Respiratory: Negative.   Cardiovascular: Negative.   Gastrointestinal: Negative.   Endocrine: Negative.   Genitourinary: Negative.   Musculoskeletal: Positive for arthralgias (right hip pain ).  Skin: Negative.   Allergic/Immunologic: Negative.   Neurological: Negative.   Hematological: Negative.   Psychiatric/Behavioral: Negative.        Objective:    Physical Exam  Constitutional: She is oriented to person, place, and time. She appears well-developed and well-nourished.  Patient is pleasant alert and looks much younger than her stated age of almost being 84 years old.  HENT:  Head: Normocephalic and atraumatic.  Right Ear: External ear normal.  Left Ear: External ear normal.  Nose: Nose normal.  Mouth/Throat: Oropharynx is clear and moist. No oropharyngeal exudate.  Eyes: Pupils are equal, round, and reactive to light. Conjunctivae and EOM are normal. Right eye exhibits no discharge. Left eye exhibits no discharge. No scleral icterus.  Neck: Normal range of motion. Neck supple. No thyromegaly present.  No bruits thyromegaly or anterior cervical adenopathy  Cardiovascular: Normal rate, regular rhythm, normal heart sounds and intact distal pulses.  No murmur heard. The heart is regular at 72/min with no murmurs  Pulmonary/Chest: Effort normal and breath sounds normal. She has no wheezes. She has no rales.  Clear anteriorly and posteriorly  Abdominal: Soft. Bowel sounds are normal. She exhibits no mass. There is no tenderness.  No liver or spleen enlargement.  No epigastric tenderness.  No bruits no masses and no inguinal adenopathy.  Musculoskeletal: Normal range of motion. She exhibits no edema.  Moves all extremities well and needs minimal assistance with getting on and off the table.  Lymphadenopathy:    She has no cervical adenopathy.  Neurological: She is alert and oriented to person, place, and time. She has normal reflexes. No cranial nerve deficit.  Skin: Skin is warm and dry. No rash noted. No pallor.  Psychiatric: She has a normal mood and affect. Her behavior is normal. Judgment and thought content normal.  Mood affect behavior and judgment normal.  Nursing note and vitals reviewed.   BP 117/75 (BP Location: Left Arm)   Pulse 75   Temp 98.2 F (36.8 C) (Oral)   Ht 5' (1.524 m)   Wt 100 lb (45.4 kg)   BMI 19.53 kg/m          Assessment & Plan:  1. Pure hypercholesterolemia -Continue with Crestor pending results of lab work - CBC with Differential/Platelet - BMP8+EGFR - Lipid panel - Hepatic function panel  2. Vitamin D deficiency -Continue with vitamin D replacement and walking pending results of lab work - CBC with Differential/Platelet - VITAMIN D 25 Hydroxy (Vit-D Deficiency, Fractures)  3. Gastroesophageal reflux disease, esophagitis presence not specified -This is stable and she only takes a rare ibuprofen for her joint pain and back pain - CBC with Differential/Platelet - Hepatic function panel  4. Gait instability -Use a cane or walker when necessary.  Do not be in a hurry and take Tylenol for arthritic pain - CBC with Differential/Platelet  5.  Osteoarthritis lumbar spine -Walk regularly stay active take Tylenol and do not be in a hurry  Patient Instructions  Medicare Annual Wellness Visit  [facility] and the medical providers at [facility] strive to bring you the best medical care.  In doing so we not only want to address your current medical conditions and concerns but also to detect new conditions early and prevent illness, disease and health-related problems.    Medicare offers a yearly Wellness Visit which allows our clinical staff to assess your need for preventative services including immunizations, lifestyle education, counseling to decrease risk of preventable diseases and screening for fall risk and other medical concerns.    This visit is provided free of charge (no copay) for all Medicare recipients. The clinical pharmacists at [facility] have begun to conduct these Wellness Visits which will also include a thorough review of all your medications.    As you primary medical provider recommend that you make an appointment for your Annual Wellness Visit if you have not done so already this year.  You may  set up this appointment before you leave today or you may call back ([phone]) and schedule an appointment.  Please make sure when you call that you mention that you are scheduling your Annual Wellness Visit with the clinical pharmacist so that the appointment may be made for the proper length of time.     Continue current medications. Continue good therapeutic lifestyle changes which include good diet and exercise. Fall precautions discussed with patient. If an FOBT was given today- please return it to our front desk. If you are over 45 years old - you may need Prevnar 45 or the adult Pneumonia vaccine.  **Flu shots are available--- please call and schedule a FLU-CLINIC appointment**  After your visit with [place] today you will receive a survey in the mail or online from [employer] regarding your care with [place]. Please take a moment to fill this out. Your feedback is very important to [place] as you can help [place] better understand your patient needs as well as improve your experience and satisfaction. WE CARE ABOUT YOU!!!   Continue to walk regularly and always move slowly and be careful as to not fall Take Tylenol regularly Drink plenty of fluids and stay well-hydrated  Arrie Senate MD

## 2018-05-10 NOTE — Patient Instructions (Addendum)
Medicare Annual Wellness Visit  [facility] and the medical providers at [facility] strive to bring you the best medical care.  In doing so we not only want to address your current medical conditions and concerns but also to detect new conditions early and prevent illness, disease and health-related problems.    Medicare offers a yearly Wellness Visit which allows our clinical staff to assess your need for preventative services including immunizations, lifestyle education, counseling to decrease risk of preventable diseases and screening for fall risk and other medical concerns.    This visit is provided free of charge (no copay) for all Medicare recipients. The clinical pharmacists at [facility] have begun to conduct these Wellness Visits which will also include a thorough review of all your medications.    As you primary medical provider recommend that you make an appointment for your Annual Wellness Visit if you have not done so already this year.  You may set up this appointment before you leave today or you may call back ([phone]) and schedule an appointment.  Please make sure when you call that you mention that you are scheduling your Annual Wellness Visit with the clinical pharmacist so that the appointment may be made for the proper length of time.     Continue current medications. Continue good therapeutic lifestyle changes which include good diet and exercise. Fall precautions discussed with patient. If an FOBT was given today- please return it to our front desk. If you are over 46 years old - you may need Prevnar 60 or the adult Pneumonia vaccine.  **Flu shots are available--- please call and schedule a FLU-CLINIC appointment**  After your visit with [place] today you will receive a survey in the mail or online from [employer] regarding your care with [place]. Please take a moment to fill this out. Your feedback is very  important to [place] as you can help [place] better understand your patient needs as well as improve your experience and satisfaction. WE CARE ABOUT YOU!!!   Continue to walk regularly and always move slowly and be careful as to not fall Take Tylenol regularly Drink plenty of fluids and stay well-hydrated

## 2018-05-11 LAB — CBC WITH DIFFERENTIAL/PLATELET
BASOS: 0 %
Basophils Absolute: 0 10*3/uL (ref 0.0–0.2)
EOS (ABSOLUTE): 0.1 10*3/uL (ref 0.0–0.4)
EOS: 1 %
HEMATOCRIT: 39.4 % (ref 34.0–46.6)
HEMOGLOBIN: 13.6 g/dL (ref 11.1–15.9)
IMMATURE GRANS (ABS): 0 10*3/uL (ref 0.0–0.1)
IMMATURE GRANULOCYTES: 0 %
LYMPHS: 38 %
Lymphocytes Absolute: 2.9 10*3/uL (ref 0.7–3.1)
MCH: 31.6 pg (ref 26.6–33.0)
MCHC: 34.5 g/dL (ref 31.5–35.7)
MCV: 92 fL (ref 79–97)
Monocytes Absolute: 0.8 10*3/uL (ref 0.1–0.9)
Monocytes: 11 %
NEUTROS PCT: 50 %
Neutrophils Absolute: 3.9 10*3/uL (ref 1.4–7.0)
Platelets: 224 10*3/uL (ref 150–450)
RBC: 4.3 x10E6/uL (ref 3.77–5.28)
RDW: 13.4 % (ref 12.3–15.4)
WBC: 7.7 10*3/uL (ref 3.4–10.8)

## 2018-05-11 LAB — HEPATIC FUNCTION PANEL
ALBUMIN: 4.4 g/dL (ref 3.5–4.7)
ALK PHOS: 42 IU/L (ref 39–117)
ALT: 18 IU/L (ref 0–32)
AST: 19 IU/L (ref 0–40)
BILIRUBIN, DIRECT: 0.09 mg/dL (ref 0.00–0.40)
Bilirubin Total: 0.3 mg/dL (ref 0.0–1.2)
TOTAL PROTEIN: 6.3 g/dL (ref 6.0–8.5)

## 2018-05-11 LAB — BMP8+EGFR
BUN/Creatinine Ratio: 15 (ref 12–28)
BUN: 10 mg/dL (ref 8–27)
CALCIUM: 9.9 mg/dL (ref 8.7–10.3)
CO2: 26 mmol/L (ref 20–29)
CREATININE: 0.66 mg/dL (ref 0.57–1.00)
Chloride: 101 mmol/L (ref 96–106)
GFR, EST AFRICAN AMERICAN: 91 mL/min/{1.73_m2} (ref >59)
GFR, EST NON AFRICAN AMERICAN: 79 mL/min/{1.73_m2} (ref >59)
Glucose: 111 mg/dL — ABNORMAL HIGH (ref 65–99)
Potassium: 4.5 mmol/L (ref 3.5–5.2)
Sodium: 140 mmol/L (ref 134–144)

## 2018-05-11 LAB — LIPID PANEL
CHOL/HDL RATIO: 2.3 ratio (ref 0.0–4.4)
Cholesterol, Total: 168 mg/dL (ref 100–199)
HDL: 73 mg/dL (ref >39)
LDL Calculated: 80 mg/dL (ref 0–99)
Triglycerides: 77 mg/dL (ref 0–149)
VLDL CHOLESTEROL CAL: 15 mg/dL (ref 5–40)

## 2018-05-11 LAB — VITAMIN D 25 HYDROXY (VIT D DEFICIENCY, FRACTURES): VIT D 25 HYDROXY: 62.3 ng/mL (ref 30.0–100.0)

## 2018-05-11 IMAGING — CT CT CERVICAL SPINE W/O CM
3 of 4 series · 13 of 33 positions shown, 16 images · non-contrast
Comparison: None.

CLINICAL DATA: Status post fall, with concern for cervical spine
injury. Leg weakness.

EXAM:
CT CERVICAL SPINE WITHOUT CONTRAST
TECHNIQUE: Multidetector CT imaging of the cervical spine was performed without
intravenous contrast. Multiplanar CT image reconstructions were also
generated.

[Series 5: sagittal bone · sagittal · 0.24mm/px · 5 of 61 slices shown, 6 images]
[im 21/61  bone]
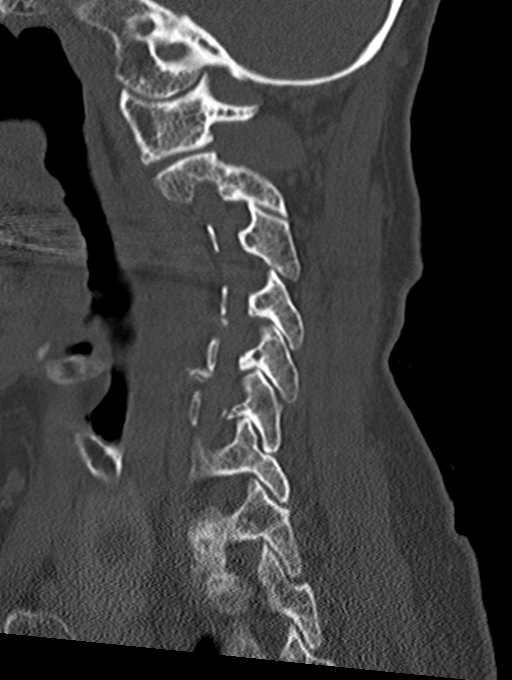
[im 26/61  bone]
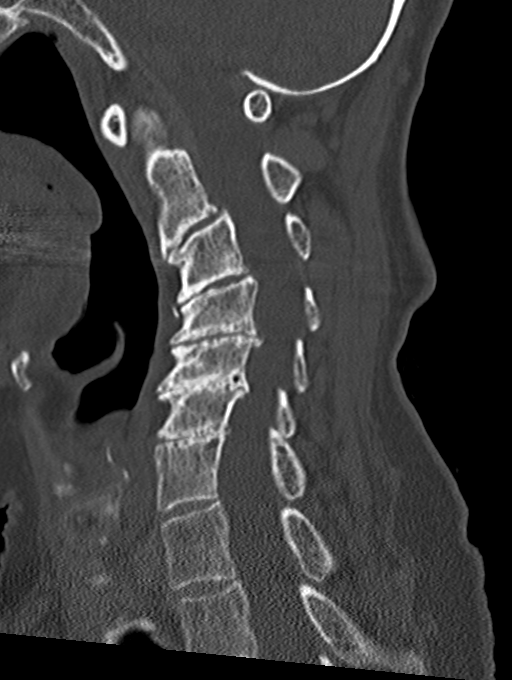
[im 31/61  soft-tissue]
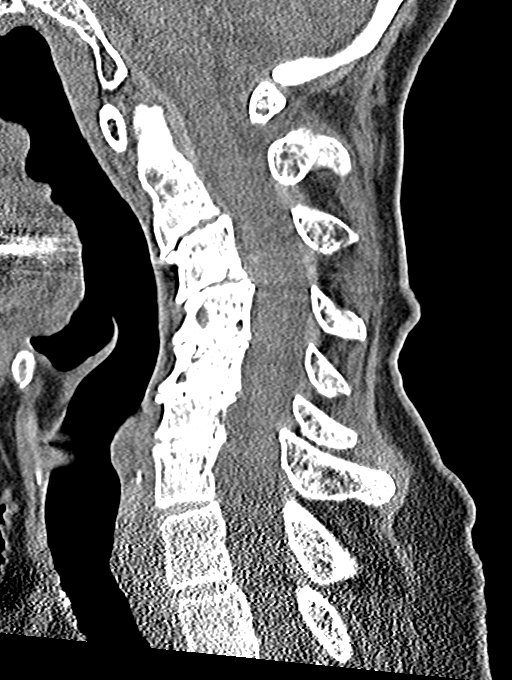
[im 31/61  bone]
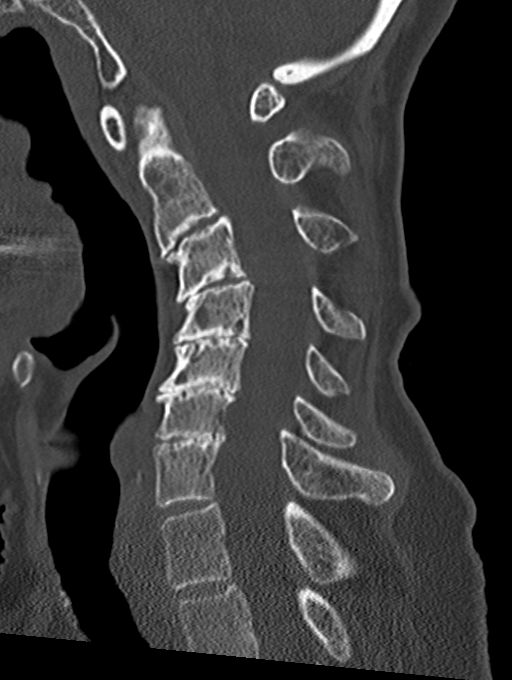
[im 36/61  bone]
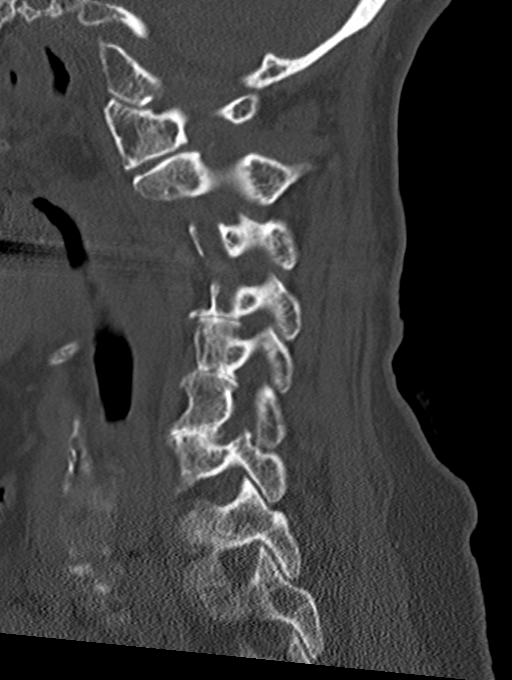
[im 41/61  bone]
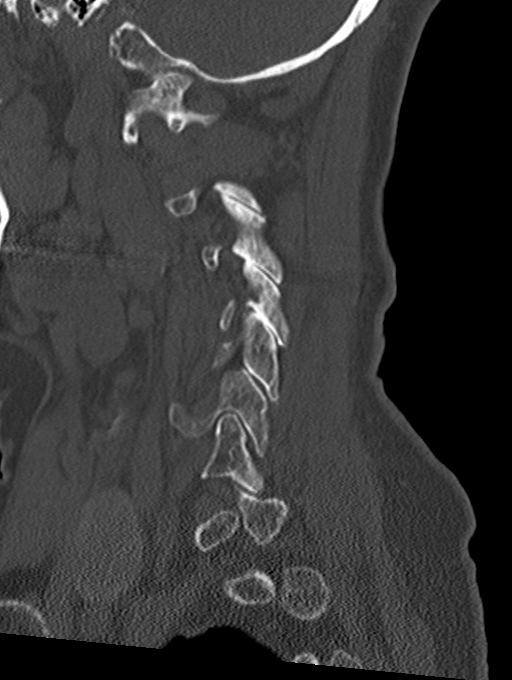

[Series 6: coronal bone · coronal · 0.24mm/px · 3 of 61 slices shown]
[im 13/61  bone]
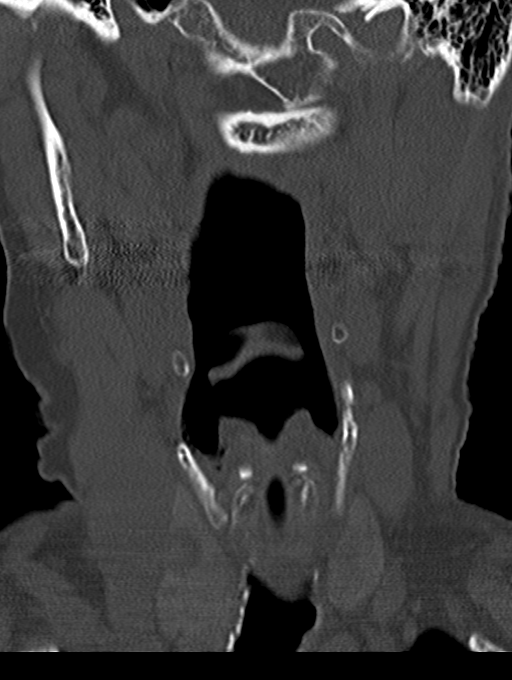
[im 25/61  bone]
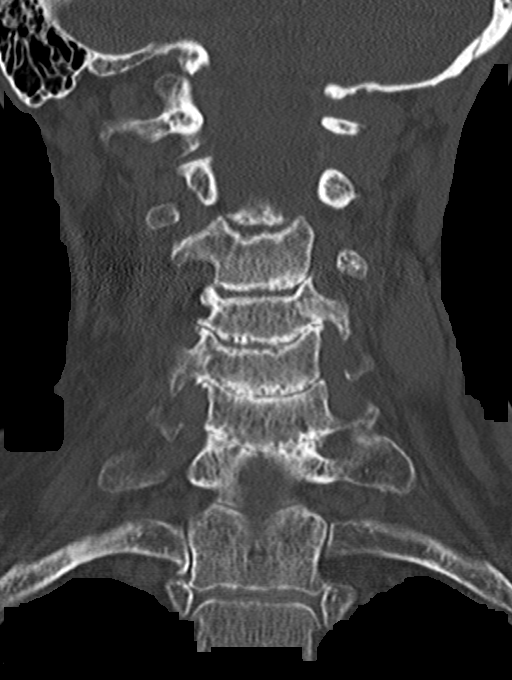
[im 37/61  bone]
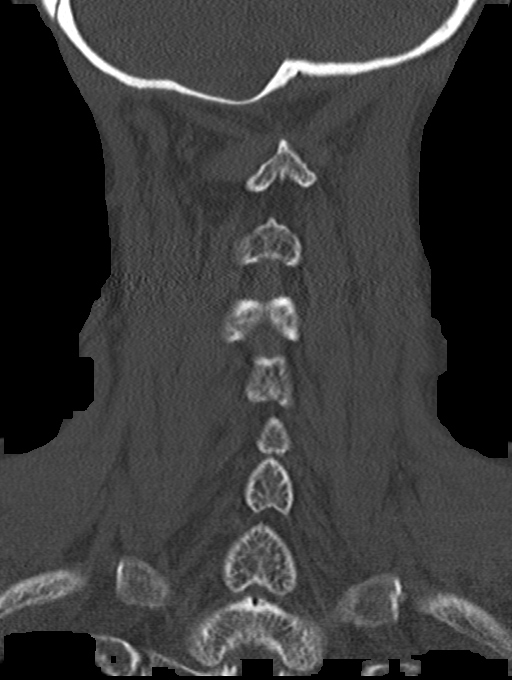

[Series 7: orthogonal bone · axial · 0.21mm/px · z∈[-169,-67]mm · 5 of 82 slices shown, 7 images]
[im 14/82  soft-tissue]
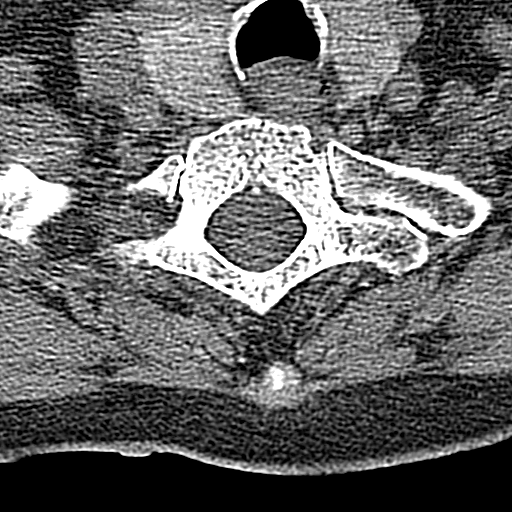
[im 14/82  bone]
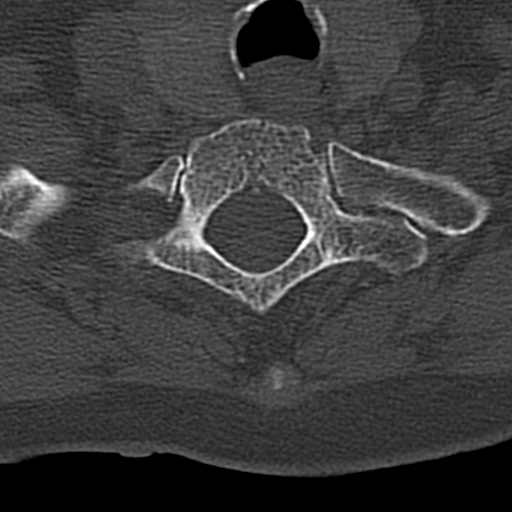
[im 28/82  bone]
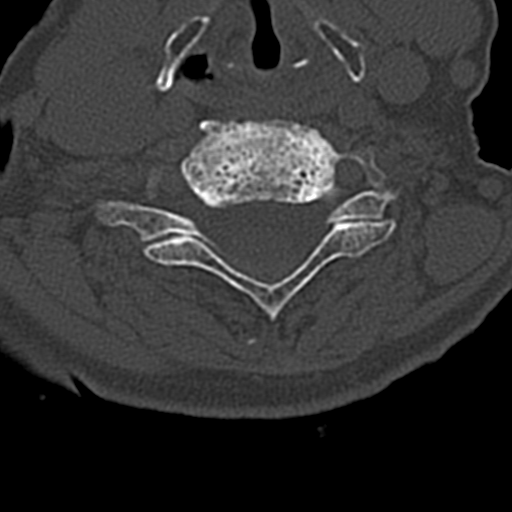
[im 41/82  bone]
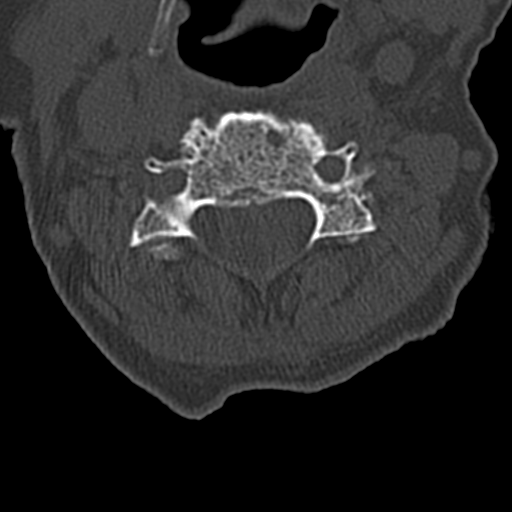
[im 55/82  bone]
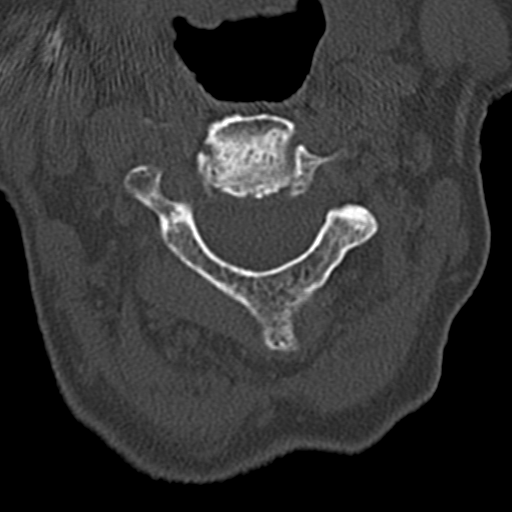
[im 68/82  soft-tissue]
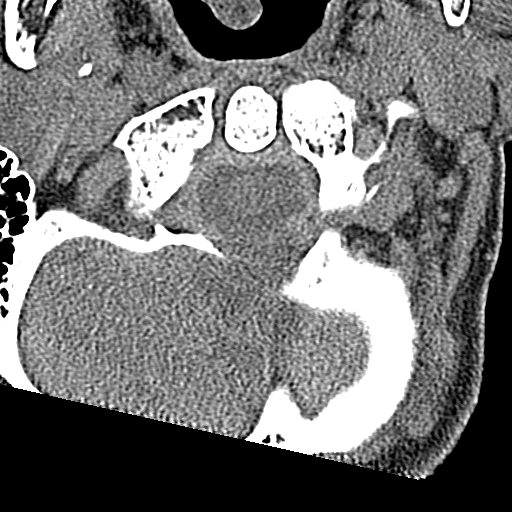
[im 68/82  bone]
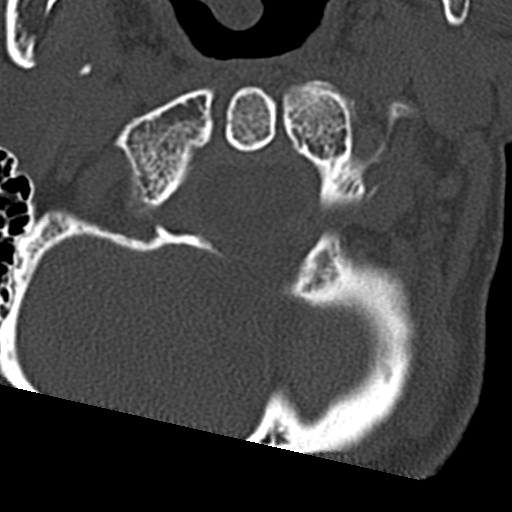

[13 of 33 positions shown; findings below may reference images not displayed]

FINDINGS: Alignment: Normal.

Skull base and vertebrae: No acute fracture. No primary bone lesion
or focal pathologic process.

Soft tissues and spinal canal: No prevertebral fluid or swelling. No
visible canal hematoma.

Disc levels: Diffuse multilevel disc space narrowing is noted along
the cervical spine, with scattered small anterior and posterior disc
osteophyte complexes.

Upper chest: A 1.3 cm hypodensity at the right thyroid lobe is
likely benign, given its size. The visualized lung apices are clear.
Mild calcification is seen at the carotid bifurcations bilaterally.

Other: There is diffuse cerebellar atrophy.
IMPRESSION: 1. No evidence of fracture or subluxation along the cervical spine.
2. Mild diffuse degenerative change along the cervical spine.
3. Mild calcification at the carotid bifurcations bilaterally.
4. Cerebellar atrophy noted.

## 2018-05-23 ENCOUNTER — Other Ambulatory Visit: Payer: Self-pay | Admitting: Family Medicine

## 2018-06-21 ENCOUNTER — Other Ambulatory Visit: Payer: Self-pay | Admitting: Family Medicine

## 2018-06-21 ENCOUNTER — Other Ambulatory Visit: Payer: Self-pay

## 2018-06-21 DIAGNOSIS — Z1239 Encounter for other screening for malignant neoplasm of breast: Secondary | ICD-10-CM

## 2018-07-03 ENCOUNTER — Other Ambulatory Visit: Payer: Self-pay | Admitting: Family Medicine

## 2018-07-18 ENCOUNTER — Telehealth: Payer: Self-pay | Admitting: Family Medicine

## 2018-07-18 NOTE — Telephone Encounter (Signed)
No samples available, pt aware

## 2018-08-03 ENCOUNTER — Other Ambulatory Visit: Payer: Self-pay | Admitting: Family Medicine

## 2018-08-06 ENCOUNTER — Other Ambulatory Visit: Payer: Self-pay | Admitting: Family Medicine

## 2018-08-07 ENCOUNTER — Telehealth: Payer: Self-pay | Admitting: Family Medicine

## 2018-08-08 NOTE — Telephone Encounter (Signed)
Pt aware - none available right now

## 2018-08-20 DIAGNOSIS — H40013 Open angle with borderline findings, low risk, bilateral: Secondary | ICD-10-CM | POA: Diagnosis not present

## 2018-08-20 DIAGNOSIS — H43813 Vitreous degeneration, bilateral: Secondary | ICD-10-CM | POA: Diagnosis not present

## 2018-08-20 DIAGNOSIS — H35372 Puckering of macula, left eye: Secondary | ICD-10-CM | POA: Diagnosis not present

## 2018-08-20 DIAGNOSIS — H35371 Puckering of macula, right eye: Secondary | ICD-10-CM | POA: Diagnosis not present

## 2018-09-11 ENCOUNTER — Encounter: Payer: Self-pay | Admitting: Family Medicine

## 2018-09-11 ENCOUNTER — Ambulatory Visit (INDEPENDENT_AMBULATORY_CARE_PROVIDER_SITE_OTHER): Payer: Medicare Other | Admitting: Family Medicine

## 2018-09-11 ENCOUNTER — Ambulatory Visit (INDEPENDENT_AMBULATORY_CARE_PROVIDER_SITE_OTHER): Payer: Medicare Other

## 2018-09-11 VITALS — BP 127/73 | HR 73 | Temp 97.9°F | Ht 60.0 in | Wt 101.0 lb

## 2018-09-11 DIAGNOSIS — M810 Age-related osteoporosis without current pathological fracture: Secondary | ICD-10-CM | POA: Diagnosis not present

## 2018-09-11 DIAGNOSIS — E559 Vitamin D deficiency, unspecified: Secondary | ICD-10-CM | POA: Diagnosis not present

## 2018-09-11 DIAGNOSIS — K219 Gastro-esophageal reflux disease without esophagitis: Secondary | ICD-10-CM | POA: Diagnosis not present

## 2018-09-11 DIAGNOSIS — Z13820 Encounter for screening for osteoporosis: Secondary | ICD-10-CM

## 2018-09-11 DIAGNOSIS — Z780 Asymptomatic menopausal state: Secondary | ICD-10-CM

## 2018-09-11 DIAGNOSIS — Z23 Encounter for immunization: Secondary | ICD-10-CM

## 2018-09-11 DIAGNOSIS — M85852 Other specified disorders of bone density and structure, left thigh: Secondary | ICD-10-CM | POA: Diagnosis not present

## 2018-09-11 DIAGNOSIS — E7800 Pure hypercholesterolemia, unspecified: Secondary | ICD-10-CM

## 2018-09-11 DIAGNOSIS — M85851 Other specified disorders of bone density and structure, right thigh: Secondary | ICD-10-CM | POA: Diagnosis not present

## 2018-09-11 NOTE — Patient Instructions (Addendum)
Medicare Annual Wellness Visit  [facility] and the medical providers at [facility] strive to bring you the best medical care.  In doing so we not only want to address your current medical conditions and concerns but also to detect new conditions early and prevent illness, disease and health-related problems.    Medicare offers a yearly Wellness Visit which allows our clinical staff to assess your need for preventative services including immunizations, lifestyle education, counseling to decrease risk of preventable diseases and screening for fall risk and other medical concerns.    This visit is provided free of charge (no copay) for all Medicare recipients. The clinical pharmacists at [facility] have begun to conduct these Wellness Visits which will also include a thorough review of all your medications.    As you primary medical provider recommend that you make an appointment for your Annual Wellness Visit if you have not done so already this year.  You may set up this appointment before you leave today or you may call back ([phone]) and schedule an appointment.  Please make sure when you call that you mention that you are scheduling your Annual Wellness Visit with the clinical pharmacist so that the appointment may be made for the proper length of time.     Continue current medications. Continue good therapeutic lifestyle changes which include good diet and exercise. Fall precautions discussed with patient. If an FOBT was given today- please return it to our front desk. If you are over 59 years old - you may need Prevnar 70 or the adult Pneumonia vaccine.  **Flu shots are available--- please call and schedule a FLU-CLINIC appointment**  After your visit with [place] today you will receive a survey in the mail or online from [employer] regarding your care with [place]. Please take a moment to fill this out. Your feedback is very  important to [place] as you can help [place] better understand your patient needs as well as improve your experience and satisfaction. WE CARE ABOUT YOU!!!   The patient will continue to drink plenty of fluids and stay well-hydrated She will continue to walk as the weather permits She will avoid climbing. She will continue to follow-up with the gastroenterologist as planned.

## 2018-09-11 NOTE — Progress Notes (Signed)
Subjective:    Patient ID: Kristi Sawyer, female    DOB: 1926-03-04, 84 y.o.   MRN: [id number]  HPI Pt here for follow up and management of chronic medical problems which includes hyperlipidemia. She is taking medication regularly.  She is here today for her regular checkup and is doing well overall with no complaints and not needing any refills.  She was given an FOBT to return and will get lab work today her flu shot today and a DEXA scan today.  The patient is doing great.  She will be 84 years old on December [birth year].  She looks and acts much younger than her stated age.  Her mother lived to be 82 years of age.  This patient still lives by herself at home and is careful to not climb and wears a life alert monitor constantly.  She stays active and walks regularly.  Since her fall over a year ago she has recovered almost completely from this but still has some right hip pain at times.  She is careful with her physical activity as far as vacuuming etc.  Today she denies any chest pain pressure tightness or shortness of breath.  She denies any trouble with swallowing heartburn indigestion nausea vomiting diarrhea blood in the stool or black tarry bowel movements.  She still continues to see the gastroenterologist yearly and this is early in the year.  She is passing her water without problems and tries to drink plenty of water and fluids daily.  She will get her flu shot today.    Patient Active Problem List   Diagnosis Date Noted  . Uncontrolled hypertension 10/28/2017  . Facial contusion, initial encounter   . Generalized weakness 10/26/2017  . Overactive bladder 06/18/2015  . Hyperlipidemia 09/26/2010  . ANXIETY 09/26/2010  . GERD (gastroesophageal reflux disease) 09/26/2010  . CELIAC SPRUE 09/26/2010  . Osteoporosis 09/26/2010   Outpatient Encounter Medications as of 09/11/2018  Medication Sig  . amLODipine (NORVASC) 5 MG tablet TAKE 1 TABLET BY MOUTH EVERY DAY  . aspirin (SB LOW DOSE ASA  EC) 81 MG EC tablet Take 81 mg by mouth daily.    . Calcium Carbonate-Vit D-Min (CALTRATE PLUS PO) Take 1 tablet by mouth daily.   . Cholecalciferol (VITAMIN D) 2000 UNITS CAPS Take by mouth as directed. Take 1 tablet daily except none on saturdays and sundays  . Fe Fum-FePoly-Vit C-Vit B3 (INTEGRA) 62.5-62.5-40-3 MG CAPS TAKE 1 CAPSULE BY MOUTH DAILY.  Marland Kitchen Inulin (FIBER CHOICE FRUITY BITES) 1.5 g CHEW Chew 5 tablets by mouth daily. FIBER CHOICE  . metoCLOPramide (REGLAN) 10 MG tablet TAKE 1 TABLET BY MOUTH EVERYDAY AT BEDTIME  . mirabegron ER (MYRBETRIQ) 50 MG TB24 tablet Take 1 tablet (50 mg total) by mouth daily.  . Multiple Vitamins-Minerals (CENTRUM SILVER PO) Take 1 tablet by mouth daily.   . Omega-3 Fatty Acids (FISH OIL) 1000 MG CAPS Take 1 capsule by mouth 3 (three) times daily.   Marland Kitchen omeprazole (PRILOSEC) 20 MG capsule TAKE 1-2 CAPSULES (20-40 MG TOTAL) BY MOUTH DAILY.  Vladimir Faster Glycol-Propyl Glycol (SYSTANE OP) Apply 1-2 drops to eye daily as needed (FOR DRY EYE).   . rosuvastatin (CRESTOR) 5 MG tablet TAKE 1 TABLET ON MON, WED, FRIDAY  . hydrALAZINE (APRESOLINE) 10 MG tablet TAKE 1 TABLET (10 MG TOTAL) BY MOUTH EVERY 8 (EIGHT) HOURS.   No facility-administered encounter medications on file as of 09/11/2018.      Review of Systems  Constitutional: Negative.  HENT: Negative.   Eyes: Negative.   Respiratory: Negative.   Cardiovascular: Negative.   Gastrointestinal: Negative.   Endocrine: Negative.   Genitourinary: Negative.   Musculoskeletal: Negative.   Skin: Negative.   Allergic/Immunologic: Negative.   Neurological: Negative.   Hematological: Negative.   Psychiatric/Behavioral: Negative.        Objective:   Physical Exam  Constitutional: She is oriented to person, place, and time. She appears well-developed and well-nourished. No distress.  The patient is pleasant and alert and small framed in size.  Once again she looks much younger than her stated age.  HENT:    Head: Normocephalic and atraumatic.  Right Ear: External ear normal.  Left Ear: External ear normal.  Nose: Nose normal.  Mouth/Throat: Oropharynx is clear and moist. No oropharyngeal exudate.  Eyes: Pupils are equal, round, and reactive to light. Conjunctivae and EOM are normal. Right eye exhibits no discharge. Left eye exhibits no discharge. No scleral icterus.  She just saw her ophthalmologist and got some new glasses.  Neck: Normal range of motion. Neck supple. No thyromegaly present.  Bruits thyromegaly or anterior cervical adenopathy are not present  Cardiovascular: Normal rate, regular rhythm, normal heart sounds and intact distal pulses.  No murmur heard. Heart is regular at 72/min.  There are good pedal pulses bilaterally with no edema.  Pulmonary/Chest: Effort normal and breath sounds normal. She has no wheezes. She has no rales.  Clear anteriorly and posteriorly  Abdominal: Soft. Bowel sounds are normal. She exhibits no mass. There is no tenderness.  No abdominal tenderness masses organ enlargement bruits or inguinal adenopathy.  Musculoskeletal: Normal range of motion. She exhibits no edema.  Good range of motion despite right hip pain.  Lymphadenopathy:    She has no cervical adenopathy.  Neurological: She is alert and oriented to person, place, and time. She has normal reflexes. No cranial nerve deficit.  Skin: Skin is warm and dry. No rash noted.  Psychiatric: She has a normal mood and affect. Her behavior is normal. Judgment and thought content normal.  The mood affect and behavior for this patient are all normal for her.  Nursing note and vitals reviewed.  BP 127/73 (BP Location: Left Arm)   Pulse 73   Temp 97.9 F (36.6 C) (Oral)   Ht 5' (1.524 m)   Wt 101 lb (45.8 kg)   BMI 19.73 kg/m         Assessment & Plan:  1. Pure hypercholesterolemia -Continue current treatment and healthy eating along with therapeutic lifestyle changes of exercise as tolerated -  BMP8+EGFR - CBC with Differential/Platelet - Lipid panel  2. Vitamin D deficiency -Continue with calcium and vitamin D replacement - CBC with Differential/Platelet - VITAMIN D 25 Hydroxy (Vit-D Deficiency, Fractures) - DG [facility] DEXA; Future  3. Gastroesophageal reflux disease, esophagitis presence not specified -Continue with omeprazole and watching diet closely - CBC with Differential/Platelet - Hepatic function panel  4. Screening for osteoporosis - DG [facility] DEXA; Future  5. Postmenopausal - DG [facility] DEXA; Future  6. Encounter for immunization - Flu vaccine HIGH DOSE PF  Patient Instructions                       Medicare Annual Wellness Visit  [facility] and the medical providers at [facility] strive to bring you the best medical care.  In doing so we not only want to address your current medical conditions and concerns but also to  detect new conditions early and prevent illness, disease and health-related problems.    Medicare offers a yearly Wellness Visit which allows our clinical staff to assess your need for preventative services including immunizations, lifestyle education, counseling to decrease risk of preventable diseases and screening for fall risk and other medical concerns.    This visit is provided free of charge (no copay) for all Medicare recipients. The clinical pharmacists at [facility] have begun to conduct these Wellness Visits which will also include a thorough review of all your medications.    As you primary medical provider recommend that you make an appointment for your Annual Wellness Visit if you have not done so already this year.  You may set up this appointment before you leave today or you may call back ([phone]) and schedule an appointment.  Please make sure when you call that you mention that you are scheduling your Annual Wellness Visit with the clinical pharmacist so that the appointment may be made  for the proper length of time.     Continue current medications. Continue good therapeutic lifestyle changes which include good diet and exercise. Fall precautions discussed with patient. If an FOBT was given today- please return it to our front desk. If you are over 20 years old - you may need Prevnar 38 or the adult Pneumonia vaccine.  **Flu shots are available--- please call and schedule a FLU-CLINIC appointment**  After your visit with [place] today you will receive a survey in the mail or online from [employer] regarding your care with [place]. Please take a moment to fill this out. Your feedback is very important to [place] as you can help [place] better understand your patient needs as well as improve your experience and satisfaction. WE CARE ABOUT YOU!!!   The patient will continue to drink plenty of fluids and stay well-hydrated She will continue to walk as the weather permits She will avoid climbing.  Arrie Senate MD

## 2018-09-12 ENCOUNTER — Other Ambulatory Visit: Payer: Medicare Other

## 2018-09-12 ENCOUNTER — Other Ambulatory Visit: Payer: Self-pay | Admitting: *Deleted

## 2018-09-12 DIAGNOSIS — Z1211 Encounter for screening for malignant neoplasm of colon: Secondary | ICD-10-CM | POA: Diagnosis not present

## 2018-09-12 LAB — CBC WITH DIFFERENTIAL/PLATELET
BASOS: 1 %
Basophils Absolute: 0.1 10*3/uL (ref 0.0–0.2)
EOS (ABSOLUTE): 0 10*3/uL (ref 0.0–0.4)
EOS: 1 %
HEMATOCRIT: 42.3 % (ref 34.0–46.6)
HEMOGLOBIN: 14 g/dL (ref 11.1–15.9)
IMMATURE GRANS (ABS): 0 10*3/uL (ref 0.0–0.1)
Immature Granulocytes: 0 %
LYMPHS: 39 %
Lymphocytes Absolute: 2.8 10*3/uL (ref 0.7–3.1)
MCH: 31.3 pg (ref 26.6–33.0)
MCHC: 33.1 g/dL (ref 31.5–35.7)
MCV: 94 fL (ref 79–97)
MONOCYTES: 12 %
Monocytes Absolute: 0.8 10*3/uL (ref 0.1–0.9)
NEUTROS ABS: 3.4 10*3/uL (ref 1.4–7.0)
Neutrophils: 47 %
Platelets: 219 10*3/uL (ref 150–450)
RBC: 4.48 x10E6/uL (ref 3.77–5.28)
RDW: 11.5 % — ABNORMAL LOW (ref 12.3–15.4)
WBC: 7.1 10*3/uL (ref 3.4–10.8)

## 2018-09-12 LAB — BMP8+EGFR
BUN/Creatinine Ratio: 15 (ref 12–28)
BUN: 11 mg/dL (ref 8–27)
CALCIUM: 9.8 mg/dL (ref 8.7–10.3)
CO2: 25 mmol/L (ref 20–29)
CREATININE: 0.75 mg/dL (ref 0.57–1.00)
Chloride: 99 mmol/L (ref 96–106)
GFR, EST AFRICAN AMERICAN: 82 mL/min/{1.73_m2} (ref >59)
GFR, EST NON AFRICAN AMERICAN: 71 mL/min/{1.73_m2} (ref >59)
Glucose: 88 mg/dL (ref 65–99)
POTASSIUM: 4.8 mmol/L (ref 3.5–5.2)
Sodium: 138 mmol/L (ref 134–144)

## 2018-09-12 LAB — LIPID PANEL
CHOL/HDL RATIO: 2.1 ratio (ref 0.0–4.4)
Cholesterol, Total: 164 mg/dL (ref 100–199)
HDL: 79 mg/dL (ref >39)
LDL CALC: 74 mg/dL (ref 0–99)
Triglycerides: 57 mg/dL (ref 0–149)
VLDL CHOLESTEROL CAL: 11 mg/dL (ref 5–40)

## 2018-09-12 LAB — HEPATIC FUNCTION PANEL
ALK PHOS: 42 IU/L (ref 39–117)
ALT: 16 IU/L (ref 0–32)
AST: 17 IU/L (ref 0–40)
Albumin: 4.5 g/dL (ref 3.5–4.7)
BILIRUBIN, DIRECT: 0.08 mg/dL (ref 0.00–0.40)
Bilirubin Total: 0.2 mg/dL (ref 0.0–1.2)
Total Protein: 6.4 g/dL (ref 6.0–8.5)

## 2018-09-12 LAB — VITAMIN D 25 HYDROXY (VIT D DEFICIENCY, FRACTURES): Vit D, 25-Hydroxy: 57.9 ng/mL (ref 30.0–100.0)

## 2018-09-13 LAB — FECAL OCCULT BLOOD, IMMUNOCHEMICAL: FECAL OCCULT BLD: NEGATIVE

## 2018-09-25 ENCOUNTER — Ambulatory Visit (INDEPENDENT_AMBULATORY_CARE_PROVIDER_SITE_OTHER): Payer: Medicare Other | Admitting: Pharmacist Clinician (PhC)/ Clinical Pharmacy Specialist

## 2018-09-25 DIAGNOSIS — M810 Age-related osteoporosis without current pathological fracture: Secondary | ICD-10-CM | POA: Diagnosis not present

## 2018-09-25 MED ORDER — DICLOFENAC SODIUM 1 % TD GEL: TRANSDERMAL | 1 refills | Status: DC
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
## 2018-09-25 NOTE — Addendum Note (Signed)
Addended by: Zannie Cove on: 09/25/2018 03:03 PM   Modules accepted: Orders

## 2018-09-27 ENCOUNTER — Other Ambulatory Visit: Payer: Self-pay | Admitting: Family Medicine

## 2018-09-30 ENCOUNTER — Telehealth: Payer: Self-pay | Admitting: *Deleted

## 2018-09-30 NOTE — Telephone Encounter (Signed)
Insurance verification submitted and summary of benefits received. Patient does not have a deductible to meet and there is no prior authorization required. Her insurance will cover Prolia at 100%. There is no out of pocket cost for her.   Discussed with patient and scheduled her for 10/08/18 for the injection. This will be buy and bill through [facility]. She is not eligible to receive it through a specialty pharmacy.

## 2018-10-03 ENCOUNTER — Other Ambulatory Visit: Payer: Self-pay

## 2018-10-08 ENCOUNTER — Ambulatory Visit (INDEPENDENT_AMBULATORY_CARE_PROVIDER_SITE_OTHER): Payer: Medicare Other | Admitting: *Deleted

## 2018-10-08 DIAGNOSIS — M810 Age-related osteoporosis without current pathological fracture: Secondary | ICD-10-CM | POA: Diagnosis not present

## 2018-10-08 MED ORDER — DENOSUMAB 60 MG/ML ~~LOC~~ SOSY
60.0000 mg | PREFILLED_SYRINGE | Freq: Once | SUBCUTANEOUS | Status: AC
  Administered 2018-10-08: 60 mg via SUBCUTANEOUS

## 2018-10-08 NOTE — Progress Notes (Signed)
Pt given Prolia inj Tolerated well Buy and bill Last BMP and Dexa 09/11/2018

## 2018-11-08 ENCOUNTER — Other Ambulatory Visit: Payer: Self-pay | Admitting: Family Medicine

## 2018-11-12 ENCOUNTER — Other Ambulatory Visit: Payer: Self-pay | Admitting: Family Medicine

## 2018-11-14 ENCOUNTER — Ambulatory Visit (INDEPENDENT_AMBULATORY_CARE_PROVIDER_SITE_OTHER): Payer: Medicare Other | Admitting: Family Medicine

## 2018-11-14 ENCOUNTER — Encounter: Payer: Self-pay | Admitting: Family Medicine

## 2018-11-14 VITALS — BP 126/70 | HR 72 | Temp 97.8°F | Ht 60.0 in | Wt 100.0 lb

## 2018-11-14 DIAGNOSIS — J019 Acute sinusitis, unspecified: Secondary | ICD-10-CM

## 2018-11-14 DIAGNOSIS — B9689 Other specified bacterial agents as the cause of diseases classified elsewhere: Secondary | ICD-10-CM | POA: Diagnosis not present

## 2018-11-14 MED ORDER — DOXYCYCLINE HYCLATE 100 MG PO TABS: 100.0000 mg | ORAL_TABLET | Freq: Two times a day (BID) | ORAL | 0 refills | Status: AC

## 2018-11-14 MED ORDER — FLUTICASONE PROPIONATE 50 MCG/ACT NA SUSP: 2.0000 | Freq: Every day | NASAL | 6 refills | Status: DC

## 2018-11-14 NOTE — Patient Instructions (Signed)

## 2018-11-14 NOTE — Progress Notes (Signed)
Subjective:    Patient ID: Kristi Sawyer, female    DOB: 05/16/1926, 85 y.o.   MRN: [id number]  Chief Complaint:  URI (sinus congestion, drainage, scratchy throat, cough; x 3 weeks)   HPI: Kristi Sawyer is a 85 y.o. female presenting on 11/14/2018 for URI (sinus congestion, drainage, scratchy throat, cough; x 3 weeks)   1. Acute bacterial rhinosinusitis   Pt presents today with complaints of sinus pressure, congestion, rhinorrhea, postnasal drainage, sore throat, and ear pressure. She states this started a month ago and is not getting better with over the counter treatments. She has tried Alkaseltzer cold and sinus without relief. She has chills and fatigue with the symptoms. Pt denies cough, fever, chest pain, or shortness of breath.   Relevant past medical, surgical, family, and social history reviewed and updated as indicated.  Allergies and medications reviewed and updated.   Past Medical History:  Diagnosis Date  . Acid reflux   . Anemia   . Anxiety   . Cataracts, bilateral   . Celiac disease/sprue   . Chronic gastritis   . Esophageal stricture   . Euthyroid   . Gastritis   . Hiatal hernia   . Hyperlipidemia   . Hypermobility syndrome   . 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 Neuropathy   . Osteoporosis   . Palpitations   . Postmenopausal HRT (hormone replacement therapy)   . Shingles   . Thyroid tumor, benign     Past Surgical History:  Procedure Laterality Date  . ABDOMINAL HYSTERECTOMY    . APPENDECTOMY  1935  . CATARACT EXTRACTION Bilateral   . COLONOSCOPY    . ESOPHAGOGASTRODUODENOSCOPY    . EYE SURGERY    . MYOMECTOMY    . PARTIAL HYSTERECTOMY  1974   w 1/2 right ovary   . RETINAL LASER PROCEDURE    . SIGMOIDOSCOPY    . THYROIDECTOMY  1975   rt. benign tumor     Social History   Socioeconomic History  . Marital status: Widowed    Spouse name: Not on file  . Number of children: 0  . Years of education: 16  . Highest education  level: Bachelor's degree (e.g., BA, AB, BS)  Occupational History  . Occupation: retired    Fish farm manager: RETIRED  Social Needs  . Financial resource strain: Not very hard  . Food insecurity:    Worry: Never true    Inability: Never true  . Transportation needs:    Medical: No    Non-medical: No  Tobacco Use  . Smoking status: Never Smoker  . Smokeless tobacco: Never Used  Substance and Sexual Activity  . Alcohol use: No  . Drug use: No  . Sexual activity: Not Currently  Lifestyle  . Physical activity:    Days per week: 3 days    Minutes per session: 10 min  . Stress: Only a little  Relationships  . Social connections:    Talks on phone: More than three times a week    Gets together: More than three times a week    Attends religious service: More than 4 times per year    Active member of club or organization: Yes    Attends meetings of clubs or organizations: More than 4 times per year    Relationship status: Widowed  . Intimate partner violence:    Fear of current or ex partner: No    Emotionally abused: No  Physically abused: No    Forced sexual activity: No  Other Topics Concern  . 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 amLODipine (NORVASC) 5 MG tablet TAKE 1 TABLET BY MOUTH EVERY DAY  . aspirin (SB LOW DOSE ASA EC) 81 MG EC tablet Take 81 mg by mouth daily.    . Calcium Carbonate-Vit D-Min (CALTRATE PLUS PO) Take 1 tablet by mouth daily.   . Cholecalciferol (VITAMIN D) 2000 UNITS CAPS Take by mouth as directed. Take 1 tablet daily except none on saturdays and sundays  . diclofenac sodium (VOLTAREN) 1 % GEL Apply to affected area BID PRN  . Fe Fum-FePoly-Vit C-Vit B3 (INTEGRA) 62.5-62.5-40-3 MG CAPS TAKE 1 CAPSULE BY MOUTH DAILY.  . hydrALAZINE (APRESOLINE) 10 MG tablet TAKE 1  TABLET (10 MG TOTAL) BY MOUTH EVERY 8 (EIGHT) HOURS.  Marland Kitchen Inulin (FIBER CHOICE FRUITY BITES) 1.5 g CHEW Chew 5 tablets by mouth daily. FIBER CHOICE  . metoCLOPramide (REGLAN) 10 MG tablet TAKE 1 TABLET BY MOUTH EVERYDAY AT BEDTIME  . mirabegron ER (MYRBETRIQ) 50 MG TB24 tablet Take 1 tablet (50 mg total) by mouth daily.  . Multiple Vitamins-Minerals (CENTRUM SILVER PO) Take 1 tablet by mouth daily.   . Omega-3 Fatty Acids (FISH OIL) 1000 MG CAPS Take 1 capsule by mouth 3 (three) times daily.   Marland Kitchen omeprazole (PRILOSEC) 20 MG capsule TAKE 1-2 CAPSULES (20-40 MG TOTAL) BY MOUTH DAILY.  Vladimir Faster Glycol-Propyl Glycol (SYSTANE OP) Apply 1-2 drops to eye daily as needed (FOR DRY EYE).   . rosuvastatin (CRESTOR) 5 MG tablet TAKE 1 TABLET ON MON, WED, FRIDAY  . doxycycline (VIBRA-TABS) 100 MG tablet Take 1 tablet (100 mg total) by mouth 2 (two) times daily for 7 days. 1 po bid  . fluticasone (FLONASE) 50 MCG/ACT nasal spray Place 2 sprays into both nostrils daily.   No facility-administered encounter medications on file as of 11/14/2018.     Allergies  Allergen Reactions  . Prevnar 13 [Pneumococcal 13-Val Conj Vacc] Hives    Red/streaking arm  . Penicillins Hives    Has patient had a PCN reaction causing immediate rash, facial/tongue/throat swelling, SOB or lightheadedness with hypotension: No Has patient had a PCN reaction causing severe rash involving mucus membranes or skin necrosis: No Has patient had a PCN reaction that required hospitalization: No Has patient had a PCN reaction occurring within the last 10 years: No If all of the above answers are "NO", then may proceed with Cephalosporin use.   . Tetanus Toxoids Other (See Comments)    Area of injection was red and streaked.  . Codeine Nausea Only  . Sulfonamide Derivatives Nausea Only    Review of Systems  Constitutional: Positive for activity change, chills and fatigue. Negative for fever and unexpected weight change.  HENT: Positive  for congestion, ear pain, postnasal drip, rhinorrhea, sinus pressure, sinus pain and sore throat. Negative for tinnitus.   Eyes: Negative for visual disturbance.  Respiratory: Negative for cough, chest tightness and shortness of breath.   Cardiovascular: Negative for chest pain, palpitations and leg swelling.  Gastrointestinal: Negative for abdominal pain, constipation, nausea and vomiting.  Musculoskeletal: Negative for arthralgias and myalgias.  Neurological: Positive for headaches. Negative for dizziness, weakness and numbness.  Psychiatric/Behavioral: Negative for confusion.  All other systems reviewed and are negative.       Objective:    BP 126/70   Pulse 72   Temp 97.8 F (36.6 C) (Oral)   Ht 5' (1.524 m)   Wt 100 lb (45.4 kg)   BMI 19.53 kg/m    Wt Readings from Last 3 Encounters:  11/14/18 100 lb (45.4 kg)  09/11/18 101 lb (45.8 kg)  05/10/18 100 lb (45.4 kg)    Physical Exam Vitals signs and nursing note reviewed.  Constitutional:      General: She is not in acute distress.    Appearance: Normal appearance. She is well-developed, well-groomed and normal weight. She is not ill-appearing.  HENT:     Head: Normocephalic and atraumatic.     Right Ear: Hearing, ear canal and external ear normal. A middle ear effusion is present. Tympanic membrane is not perforated or erythematous.     Left Ear: Hearing, ear canal and external ear normal. A middle ear effusion is present. Tympanic membrane is not perforated or erythematous.     Nose: Congestion and rhinorrhea present. Rhinorrhea is purulent.     Right Turbinates: Swollen.     Left Turbinates: Swollen.     Right Sinus: Maxillary sinus tenderness and frontal sinus tenderness present.     Left Sinus: Maxillary sinus tenderness and frontal sinus tenderness present.     Mouth/Throat:     Lips: Pink.     Mouth: Mucous membranes are moist.     Pharynx: Uvula midline. Posterior oropharyngeal erythema present. No pharyngeal  swelling, oropharyngeal exudate or uvula swelling.     Tonsils: No tonsillar exudate or tonsillar abscesses.  Eyes:     General: Lids are normal.     Conjunctiva/sclera: Conjunctivae normal.     Pupils: Pupils are equal, round, and reactive to light.  Neck:     Musculoskeletal: Neck supple.     Thyroid: No thyroid mass, thyromegaly or thyroid tenderness.     Trachea: Trachea and phonation normal.  Cardiovascular:     Rate and Rhythm: Normal rate and regular rhythm.     Heart sounds: Normal heart sounds. No murmur. No friction rub. No gallop.   Pulmonary:     Effort: Pulmonary effort is normal. No respiratory distress.     Breath sounds: Normal breath sounds.  Lymphadenopathy:     Cervical: No cervical adenopathy.  Skin:    General: Skin is warm and dry.     Capillary Refill: Capillary refill takes less than 2 seconds.  Neurological:     General: No focal deficit present.     Mental Status: She is alert and oriented to person, place, and time.  Psychiatric:        Mood and Affect: Mood normal.        Behavior: Behavior normal. Behavior is cooperative.        Thought Content: Thought content normal.        Judgment: Judgment normal.     Results for orders placed or performed in visit on 09/12/18  Fecal occult blood, imunochemical  Result Value Ref Range   Fecal Occult Bld Negative Negative       Pertinent labs & imaging results that were available during my care of the patient were reviewed by me and considered in my medical decision making.  Assessment & Plan:  Nakyia was seen today for uri.  Diagnoses and all orders for this visit:  Acute bacterial rhinosinusitis Increase  water intake and humidity in the air. Frequent saline nasal sprays. Tylenol as needed for fever and pain control. Medications as prescribed. Report any new or worsening symptoms.  -     fluticasone (FLONASE) 50 MCG/ACT nasal spray; Place 2 sprays into both nostrils daily. -     doxycycline  (VIBRA-TABS) 100 MG tablet; Take 1 tablet (100 mg total) by mouth 2 (two) times daily for 7 days. 1 po bid     Continue all other maintenance medications.  Follow up plan: Return in about 4 weeks (around 12/12/2018).  Educational handout given for sinusitis  The above assessment and management plan was discussed with the patient. The patient verbalized understanding of and has agreed to the management plan. Patient is aware to call the clinic if symptoms persist or worsen. Patient is aware when to return to the clinic for a follow-up visit. Patient educated on when it is appropriate to go to the emergency department.   Monia Pouch, FNP-C [facility] [phone]

## 2018-11-27 ENCOUNTER — Other Ambulatory Visit: Payer: Self-pay | Admitting: Family Medicine

## 2018-12-02 DIAGNOSIS — H43812 Vitreous degeneration, left eye: Secondary | ICD-10-CM | POA: Diagnosis not present

## 2018-12-02 DIAGNOSIS — H35371 Puckering of macula, right eye: Secondary | ICD-10-CM | POA: Diagnosis not present

## 2018-12-02 DIAGNOSIS — H35372 Puckering of macula, left eye: Secondary | ICD-10-CM | POA: Diagnosis not present

## 2018-12-04 ENCOUNTER — Other Ambulatory Visit: Payer: Self-pay | Admitting: Family Medicine

## 2018-12-11 DIAGNOSIS — N3946 Mixed incontinence: Secondary | ICD-10-CM | POA: Diagnosis not present

## 2018-12-11 DIAGNOSIS — R351 Nocturia: Secondary | ICD-10-CM | POA: Diagnosis not present

## 2019-01-10 ENCOUNTER — Encounter: Payer: Self-pay | Admitting: Nurse Practitioner

## 2019-01-10 ENCOUNTER — Ambulatory Visit (INDEPENDENT_AMBULATORY_CARE_PROVIDER_SITE_OTHER): Payer: Medicare Other | Admitting: Nurse Practitioner

## 2019-01-10 VITALS — BP 123/77 | HR 88 | Temp 97.0°F | Ht 60.0 in | Wt 106.0 lb

## 2019-01-10 DIAGNOSIS — B029 Zoster without complications: Secondary | ICD-10-CM | POA: Diagnosis not present

## 2019-01-10 MED ORDER — VALACYCLOVIR HCL 1 G PO TABS: 1000.0000 mg | ORAL_TABLET | Freq: Three times a day (TID) | ORAL | 0 refills | Status: DC

## 2019-01-10 MED ORDER — TRAMADOL HCL 50 MG PO TABS: 50.0000 mg | ORAL_TABLET | Freq: Two times a day (BID) | ORAL | 0 refills | Status: DC

## 2019-01-10 NOTE — Patient Instructions (Signed)
Shingles    Shingles is an infection. It gives you a painful skin rash and blisters that have fluid in them. Shingles is caused by the same germ (virus) that causes chickenpox.  Shingles only happens in people who:   Have had chickenpox.   Have been given a shot of medicine (vaccine) to protect against chickenpox. Shingles is rare in this group.  The first symptoms of shingles may be itching, tingling, or pain in an area on your skin. A rash will show on your skin a few days or weeks later. The rash is likely to be on one side of your body. The rash usually has a shape like a belt or a band. Over time, the rash turns into fluid-filled blisters. The blisters will break open, change into scabs, and dry up. Medicines may:   Help with pain and itching.   Help you get better sooner.   Help to prevent long-term problems.  Follow these instructions at home:  Medicines   Take over-the-counter and prescription medicines only as told by your doctor.   Put on an anti-itch cream or numbing cream where you have a rash, blisters, or scabs. Do this as told by your doctor.  Helping with itching and discomfort     Put cold, wet cloths (cold compresses) on the area of the rash or blisters as told by your doctor.   Cool baths can help you feel better. Try adding baking soda or dry oatmeal to the water to lessen itching. Do not bathe in hot water.  Blister and rash care   Keep your rash covered with a loose bandage (dressing).   Wear loose clothing that does not rub on your rash.   Keep your rash and blisters clean. To do this, wash the area with mild soap and cool water as told by your doctor.   Check your rash every day for signs of infection. Check for:  ? More redness, swelling, or pain.  ? Fluid or blood.  ? Warmth.  ? Pus or a bad smell.   Do not scratch your rash. Do not pick at your blisters. To help you to not scratch:  ? Keep your fingernails clean and cut short.  ? Wear gloves or mittens when you sleep, if  scratching is a problem.  General instructions   Rest as told by your doctor.   Keep all follow-up visits as told by your doctor. This is important.   Wash your hands often with soap and water. If soap and water are not available, use hand sanitizer. Doing this lowers your chance of getting a skin infection caused by germs (bacteria).   Your infection can cause chickenpox in people who have never had chickenpox or never got a shot of chickenpox vaccine. If you have blisters that did not change into scabs yet, try not to touch other people or be around other people, especially:  ? Babies.  ? Pregnant women.  ? Children who have areas of red, itchy, or rough skin (eczema).  ? Very old people who have transplants.  ? People who have a long-term (chronic) sickness, like cancer or AIDS.  Contact a doctor if:   Your pain does not get better with medicine.   Your pain does not get better after the rash heals.   You have any signs of infection in the rash area. These signs include:  ? More redness, swelling, or pain around the rash.  ? Fluid or blood coming from   the rash.  ? The rash area feeling warm to the touch.  ? Pus or a bad smell coming from the rash.  Get help right away if:   The rash is on your face or nose.   You have pain in your face or pain by your eye.   You lose feeling on one side of your face.   You have trouble seeing.   You have ear pain, or you have ringing in your ear.   You have a loss of taste.   Your condition gets worse.  Summary   Shingles gives you a painful skin rash and blisters that have fluid in them.   Shingles is an infection. It is caused by the same germ (virus) that causes chickenpox.   Keep your rash covered with a loose bandage (dressing). Wear loose clothing that does not rub on your rash.   If you have blisters that did not change into scabs yet, try not to touch other people or be around people.  This information is not intended to replace advice given to you by  your health care provider. Make sure you discuss any questions you have with your health care provider.  Document Released: 04/17/2008 Document Revised: 07/04/2017 Document Reviewed: 07/04/2017  Elsevier Interactive Patient Education  2019 Elsevier Inc[employer]

## 2019-01-10 NOTE — Progress Notes (Signed)
   Subjective:    Patient ID: Kristi Sawyer, female    DOB: July 15, 1926, 85 y.o.   MRN: [id number]   Chief Complaint: rash on left side   HPI Patient come sin c/o rash on left flank. Painful and itching. Started 2 days ago. Is spreading.    Review of Systems  Constitutional: Negative.   Respiratory: Negative.   Cardiovascular: Negative.   Genitourinary: Negative.   Neurological: Negative.   Psychiatric/Behavioral: Negative.   All other systems reviewed and are negative.      Objective:   Physical Exam Constitutional:      General: She is in acute distress (moderate).     Appearance: She is normal weight.  Cardiovascular:     Rate and Rhythm: Normal rate and regular rhythm.     Pulses: Normal pulses.     Heart sounds: Normal heart sounds.  Pulmonary:     Breath sounds: Normal breath sounds.  Abdominal:     General: Abdomen is flat.     Palpations: Abdomen is soft.  Skin:    General: Skin is warm.     Findings: Rash (left flank- seephoto) present.  Neurological:     General: No focal deficit present.     Mental Status: She is alert and oriented to person, place, and time.  Psychiatric:        Mood and Affect: Mood normal.        Behavior: Behavior normal.      BP 123/77   Pulse 88   Temp (!) 97 F (36.1 C) (Oral)   Ht 5' (1.524 m)   Wt 106 lb (48.1 kg)   BMI 20.70 kg/m         Assessment & Plan:  Kristi Sawyer in today with chief complaint of rash on left side   1. Herpes zoster without complication Do not pick or scratch at area RTO prn - valACYclovir (VALTREX) 1000 MG tablet; Take 1 tablet (1,000 mg total) by mouth 3 (three) times daily.  Dispense: 21 tablet; Refill: 0 - traMADol (ULTRAM) 50 MG tablet; Take 1 tablet (50 mg total) by mouth 2 (two) times daily.  Dispense: 40 tablet; Refill: 0   Mary-Margaret Hassell Done, FNP

## 2019-01-27 ENCOUNTER — Encounter: Payer: Self-pay | Admitting: Family Medicine

## 2019-01-27 ENCOUNTER — Other Ambulatory Visit: Payer: Self-pay

## 2019-01-27 ENCOUNTER — Ambulatory Visit (INDEPENDENT_AMBULATORY_CARE_PROVIDER_SITE_OTHER): Payer: Medicare Other | Admitting: Family Medicine

## 2019-01-27 VITALS — BP 138/84 | HR 80 | Temp 97.7°F | Ht 60.0 in | Wt 104.0 lb

## 2019-01-27 DIAGNOSIS — B029 Zoster without complications: Secondary | ICD-10-CM

## 2019-01-27 DIAGNOSIS — B0229 Other postherpetic nervous system involvement: Secondary | ICD-10-CM | POA: Diagnosis not present

## 2019-01-27 MED ORDER — TRAMADOL HCL 50 MG PO TABS: 50.0000 mg | ORAL_TABLET | Freq: Two times a day (BID) | ORAL | 0 refills | Status: DC

## 2019-01-27 NOTE — Progress Notes (Signed)
BP 138/84   Pulse 80   Temp 97.7 F (36.5 C) (Oral)   Ht 5' (1.524 m)   Wt 104 lb (47.2 kg)   BMI 20.31 kg/m    Subjective:    Patient ID: Kristi Sawyer, female    DOB: [birth year], 85 y.o.   MRN: [id number]  HPI: Kristi Sawyer is a 85 y.o. female presenting on 01/27/2019 for Herpes Zoster (Patient was seen 2/28 and states she is a little better and is still having a lot of pain. Back and left side)   HPI Nerve pain from herpes Patient comes in complaining of pain along the left lateral side of her body that she says started from the shingles that she had on there.  She was seen on 01/10/2019 for this and did the valacyclovir and that is drying up but she still having a lot of the nerve pain.  This is the third time she had shingles but she says this is definitely the worst time she is had shingles.  Patient denies any drainage or redness or warmth.  Patient is still having some of the rash there although it is almost completely dried and scabbed over.  She says mainly the biggest issue she has been having because she uses ibuprofen during the day and it does good for during the day but at night she cannot get comfortable and it still has been bothering her significantly.  She denies any pain radiating anywhere else except for along that nerve root that comes around from her back to her lower chest just under her breast.  Relevant past medical, surgical, family and social history reviewed and updated as indicated. Interim medical history since our last visit reviewed. Allergies and medications reviewed and updated.  Review of Systems  Constitutional: Negative for chills and fever.  Eyes: Negative for visual disturbance.  Respiratory: Negative for chest tightness and shortness of breath.   Cardiovascular: Negative for chest pain and leg swelling.  Musculoskeletal: Negative for back pain and gait problem.  Skin: Positive for rash. Negative for color change.  Neurological: Negative for  light-headedness, numbness and headaches.  Psychiatric/Behavioral: Negative for agitation and behavioral problems.  All other systems reviewed and are negative.   Per HPI unless specifically indicated above        Objective:    BP 138/84   Pulse 80   Temp 97.7 F (36.5 C) (Oral)   Ht 5' (1.524 m)   Wt 104 lb (47.2 kg)   BMI 20.31 kg/m   Wt Readings from Last 3 Encounters:  01/27/19 104 lb (47.2 kg)  01/10/19 106 lb (48.1 kg)  11/14/18 100 lb (45.4 kg)    Physical Exam Vitals signs and nursing note reviewed.  Constitutional:      General: She is not in acute distress.    Appearance: She is well-developed. She is not diaphoretic.  Eyes:     Conjunctiva/sclera: Conjunctivae normal.  Skin:    General: Skin is warm and dry.     Findings: Rash (Small papular rash in a dermatomal pattern just under her left breast coming around on the left side from her back.  Mostly scabbed over and dry.  Painful to light touch) present.  Neurological:     Mental Status: She is alert and oriented to person, place, and time.     Coordination: Coordination normal.  Psychiatric:        Behavior: Behavior normal.  Assessment & Plan:   Problem List Items Addressed This Visit    None    Visit Diagnoses    Other postherpetic nervous system involvement    -  Primary   Relevant Medications   traMADol (ULTRAM) 50 MG tablet   Herpes zoster without complication       Relevant Medications   traMADol (ULTRAM) 50 MG tablet      Gave patient another small refill of tramadol for neuropathic pain to help her sleep at night, continue ibuprofen in the daytime. Follow up plan: Return if symptoms worsen or fail to improve.  Counseling provided for all of the vaccine components No orders of the defined types were placed in this encounter.   [name], MD [facility] 01/27/2019, 1:38 PM

## 2019-02-19 ENCOUNTER — Telehealth: Payer: Self-pay | Admitting: Internal Medicine

## 2019-02-19 ENCOUNTER — Ambulatory Visit: Payer: Medicare Other | Admitting: Family Medicine

## 2019-02-19 MED ORDER — INTEGRA 62.5-62.5-40-3 MG PO CAPS: 1.0000 | ORAL_CAPSULE | Freq: Every day | ORAL | 1 refills | Status: DC

## 2019-02-19 NOTE — Telephone Encounter (Signed)
Refill for 30 days supply has been given, pt aware. Scheduled a yearly follow up for 03-11-2019 @ 2pm.

## 2019-02-19 NOTE — Telephone Encounter (Signed)
[facility] called to follow up on a refill request for Integra.

## 2019-03-09 ENCOUNTER — Other Ambulatory Visit: Payer: Self-pay | Admitting: Family Medicine

## 2019-03-11 ENCOUNTER — Ambulatory Visit (INDEPENDENT_AMBULATORY_CARE_PROVIDER_SITE_OTHER): Payer: Medicare Other | Admitting: Internal Medicine

## 2019-03-11 ENCOUNTER — Other Ambulatory Visit: Payer: Self-pay

## 2019-03-11 ENCOUNTER — Encounter: Payer: Self-pay | Admitting: Internal Medicine

## 2019-03-11 DIAGNOSIS — K219 Gastro-esophageal reflux disease without esophagitis: Secondary | ICD-10-CM

## 2019-03-11 DIAGNOSIS — K900 Celiac disease: Secondary | ICD-10-CM | POA: Diagnosis not present

## 2019-03-11 NOTE — Assessment & Plan Note (Signed)
Not having any problems.  Maintains a gluten-free diet with the exception of an occasional hot dog or piece of pizza but she does not have problems from that.

## 2019-03-11 NOTE — Assessment & Plan Note (Addendum)
She is doing fine on her current regimen of daily PPI and 10 mg metoclopramide at night (no reports of tardive dyskinesia problems) which we will continue and she should have a visit in a year sooner if needed.

## 2019-03-11 NOTE — Progress Notes (Signed)
TELEHEALTH ENCOUNTER IN SETTING OF COVID-19 PANDEMIC - REQUESTED BY PATIENT SERVICE PROVIDED BY TELEMEDECINE - TYPE: Phone PATIENT LOCATION: Home PATIENT HAS CONSENTED TO TELEHEALTH VISIT PROVIDER LOCATION: OFFICE REFERRING PROVIDER:PCP Morrie Sheldon, MD PARTICIPANTS OTHER THAN PATIENT:none TIME SPENT ON CALL:10    Kristi Sawyer 85 y.o. 09-Mar-1926 [id number]  Assessment & Plan:  GERD (gastroesophageal reflux disease) She is doing fine on her current regimen of daily PPI and 10 mg metoclopramide at night (no reports of tardive dyskinesia problems) which we will continue and she should have a visit in a year sooner if needed.  CELIAC SPRUE Not having any problems.  Maintains a gluten-free diet with the exception of an occasional hot dog or piece of pizza but she does not have problems from that.      Subjective:   Chief Complaint: GERD and celiac follow-up  HPI The patient reports that she is doing well remains very active driving her car and goes to church when able, obviously during the pandemic restrictions not happening.  An occasional hot dog or pizza slice are tolerated otherwise remains gluten-free for her celiac disease.  Is on a daily PPI and nocturnal Reglan to control her reflux and regurgitation symptoms which is done nicely over the years and she continues with that.  No unintentional weight loss or dysphagia or vomiting or regurgitation reported.  Review of systems notable for postherpetic neuralgia problems that she is dealing with. Allergies  Allergen Reactions  . Prevnar 13 [Pneumococcal 13-Val Conj Vacc] Hives    Red/streaking arm  . Penicillins Hives    Has patient had a PCN reaction causing immediate rash, facial/tongue/throat swelling, SOB or lightheadedness with hypotension: No Has patient had a PCN reaction causing severe rash involving mucus membranes or skin necrosis: No Has patient had a PCN reaction that required hospitalization: No Has patient had a  PCN reaction occurring within the last 10 years: No If all of the above answers are "NO", then may proceed with Cephalosporin use.   . Tetanus Toxoids Other (See Comments)    Area of injection was red and streaked.  . Codeine Nausea Only  . Sulfonamide Derivatives Nausea Only   Current Meds  Medication Sig  . amLODipine (NORVASC) 5 MG tablet TAKE 1 TABLET BY MOUTH EVERY DAY  . aspirin (SB LOW DOSE ASA EC) 81 MG EC tablet Take 81 mg by mouth daily.    . Calcium Carbonate-Vit D-Min (CALTRATE PLUS PO) Take 1 tablet by mouth daily.   . Cholecalciferol (VITAMIN D) 2000 UNITS CAPS Take by mouth as directed. Take 1 tablet daily except none on saturdays and sundays  . diclofenac sodium (VOLTAREN) 1 % GEL APPLY TO AFFECTED AREA TWICE A DAY AS NEEDED  . Fe Fum-FePoly-Vit C-Vit B3 (INTEGRA) 62.5-62.5-40-3 MG CAPS Take 1 capsule by mouth daily.  . fluticasone (FLONASE) 50 MCG/ACT nasal spray Place 2 sprays into both nostrils daily.  . Inulin (FIBER CHOICE FRUITY BITES) 1.5 g CHEW Chew 5 tablets by mouth daily. FIBER CHOICE  . metoCLOPramide (REGLAN) 10 MG tablet TAKE 1 TABLET BY MOUTH EVERYDAY AT BEDTIME  . mirabegron ER (MYRBETRIQ) 50 MG TB24 tablet Take 1 tablet (50 mg total) by mouth daily.  . Multiple Vitamins-Minerals (CENTRUM SILVER PO) Take 1 tablet by mouth daily.   . Omega-3 Fatty Acids (FISH OIL) 1000 MG CAPS Take 1 capsule by mouth 3 (three) times daily.   Marland Kitchen omeprazole (PRILOSEC) 20 MG capsule TAKE 1-2 CAPSULES (20-40 MG TOTAL) BY  MOUTH DAILY.  Vladimir Faster Glycol-Propyl Glycol (SYSTANE OP) Apply 1-2 drops to eye daily as needed (FOR DRY EYE).   . rosuvastatin (CRESTOR) 5 MG tablet TAKE 1 TABLET ON MON, WED, FRIDAY  . traMADol (ULTRAM) 50 MG tablet Take 1 tablet (50 mg total) by mouth 2 (two) times daily.  . valACYclovir (VALTREX) 1000 MG tablet Take 1 tablet (1,000 mg total) by mouth 3 (three) times daily.   Past Medical History:  Diagnosis Date  . Acid reflux   . Anemia   . Anxiety    . Cataracts, bilateral   . Celiac disease/sprue   . Chronic gastritis   . Esophageal stricture   . Euthyroid   . Gastritis   . Hiatal hernia   . Hyperlipidemia   . Hypermobility syndrome   . 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 Neuropathy   . Osteoporosis   . Palpitations   . Postmenopausal HRT (hormone replacement therapy)   . Shingles    x 3  . Thyroid tumor, benign    Past Surgical History:  Procedure Laterality Date  . ABDOMINAL HYSTERECTOMY    . APPENDECTOMY  1935  . CATARACT EXTRACTION Bilateral   . COLONOSCOPY    . ESOPHAGOGASTRODUODENOSCOPY    . EYE SURGERY    . MYOMECTOMY    . PARTIAL HYSTERECTOMY  1974   w 1/2 right ovary   . RETINAL LASER PROCEDURE    . SIGMOIDOSCOPY    . THYROIDECTOMY  1975   rt. 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   Review of Systems See HPI

## 2019-03-11 NOTE — Patient Instructions (Signed)
There was a pleasure to talk to you and hear that you are doing well.  Please contact me if you are having any problems with your heartburn or indigestion not adequately controlled by your medications, if you develop any unusual uncontrollable movements of your lips or tongue (a potential side effect of the metoclopramide), or any unintentional changes in weight or stomach or bowel symptoms that you believe you need help with.  We should visit again in about a year, sooner if needed (hope not but always a pleasure to talk to you)  I appreciate the opportunity to care for you. Gatha Mayer, MD, [facility]

## 2019-04-11 ENCOUNTER — Other Ambulatory Visit: Payer: Self-pay

## 2019-04-11 MED ORDER — INTEGRA 62.5-62.5-40-3 MG PO CAPS: 1.0000 | ORAL_CAPSULE | Freq: Every day | ORAL | 1 refills | Status: DC

## 2019-04-16 ENCOUNTER — Telehealth: Payer: Self-pay | Admitting: Family Medicine

## 2019-04-16 ENCOUNTER — Ambulatory Visit (INDEPENDENT_AMBULATORY_CARE_PROVIDER_SITE_OTHER): Payer: Medicare Other | Admitting: Family Medicine

## 2019-04-16 ENCOUNTER — Telehealth: Payer: Self-pay | Admitting: Internal Medicine

## 2019-04-16 ENCOUNTER — Other Ambulatory Visit: Payer: Self-pay

## 2019-04-16 ENCOUNTER — Encounter: Payer: Self-pay | Admitting: Family Medicine

## 2019-04-16 DIAGNOSIS — K219 Gastro-esophageal reflux disease without esophagitis: Secondary | ICD-10-CM

## 2019-04-16 DIAGNOSIS — Z780 Asymptomatic menopausal state: Secondary | ICD-10-CM

## 2019-04-16 DIAGNOSIS — M4726 Other spondylosis with radiculopathy, lumbar region: Secondary | ICD-10-CM | POA: Diagnosis not present

## 2019-04-16 DIAGNOSIS — N3281 Overactive bladder: Secondary | ICD-10-CM

## 2019-04-16 DIAGNOSIS — E559 Vitamin D deficiency, unspecified: Secondary | ICD-10-CM

## 2019-04-16 DIAGNOSIS — E782 Mixed hyperlipidemia: Secondary | ICD-10-CM | POA: Diagnosis not present

## 2019-04-16 DIAGNOSIS — Z0000 Encounter for general adult medical examination without abnormal findings: Secondary | ICD-10-CM

## 2019-04-16 MED ORDER — METOCLOPRAMIDE HCL 10 MG PO TABS: ORAL_TABLET | ORAL | 5 refills | Status: DC

## 2019-04-16 NOTE — Telephone Encounter (Signed)
Reglan refilled.

## 2019-04-16 NOTE — Progress Notes (Signed)
Virtual Visit Via telephone Note I connected with@ on 04/16/19 by telephone and verified that I am speaking with the correct person or authorized healthcare agent using two identifiers. Kristi Sawyer is currently located at home and there are no unauthorized people in close proximity. I completed this visit while in a private location in my home.  This visit type was conducted due to national recommendations for restrictions regarding the COVID-19 Pandemic (e.g. social distancing).  This format is felt to be most appropriate for this patient at this time.  All issues noted in this document were discussed and addressed.  No physical exam was performed.    I discussed the limitations, risks, security and privacy concerns of performing an evaluation and management service by telephone and the availability of in person appointments. I also discussed with the patient that there may be a patient responsible charge related to this service. The patient expressed understanding and agreed to proceed.   Date:  04/16/2019    ID:  Kristi Sawyer      1926-05-06        [id number]   Patient Care Team Patient Care Team: Kristi Herb, MD as PCP - General Kristi Purl Ofilia Neas, MD as Consulting Physician (Gastroenterology) Kristi Rhine Clent Demark, MD as Consulting Physician (Ophthalmology)  Reason for Visit: Primary Care Follow-up     History of Present Illness & Review of Systems:     Kristi Sawyer is a 85 y.o. year old female primary care patient that presents today for a telehealth visit.  The patient is pleasant and alert and seems to be doing extremely well for being 85 years old.  She tries to walk daily and stays as active as possible while at the same time is careful not to get in the public because of [id number].  She denies any chest pain pressure tightness shortness of breath or PND.  Currently she is having no problems with swallowing heartburn indigestion nausea vomiting diarrhea blood in the stool or black  tarry bowel movements.  She sees Dr. Carlean Purl regularly and continues to take omeprazole and also uses metoclopramide as directed by Dr. Carlean Purl.  No change in bowel habits.  She passes her water well but just has frequency with no symptoms then frequency.  She has not had any falls she checks her breasts regularly and has not noticed any lumps and she is due to get a mammogram the end of June.  Review of systems as stated, otherwise negative.  The patient does not have symptoms concerning for COVID-19 infection (fever, chills, cough, or new shortness of breath).      Current Medications (Verified) Allergies as of 04/16/2019      Reactions   Prevnar 13 [pneumococcal 13-val Conj Vacc] Hives   Red/streaking arm   Penicillins Hives   Has patient had a PCN reaction causing immediate rash, facial/tongue/throat swelling, SOB or lightheadedness with hypotension: No Has patient had a PCN reaction causing severe rash involving mucus membranes or skin necrosis: No Has patient had a PCN reaction that required hospitalization: No Has patient had a PCN reaction occurring within the last 10 years: No If all of the above answers are "NO", then may proceed with Cephalosporin use.   Tetanus Toxoids Other (See Comments)   Area of injection was red and streaked.   Codeine Nausea Only   Sulfonamide Derivatives Nausea Only      Medication List       Accurate as  of April 16, 2019 12:14 PM. If you have any questions, ask your nurse or doctor.        amLODipine 5 MG tablet Commonly known as:  NORVASC TAKE 1 TABLET BY MOUTH EVERY DAY   CALTRATE PLUS PO Take 1 tablet by mouth daily.   CENTRUM SILVER PO Take 1 tablet by mouth daily.   diclofenac sodium 1 % Gel Commonly known as:  VOLTAREN APPLY TO AFFECTED AREA TWICE A DAY AS NEEDED   Fiber Choice Fruity Bites 1.5 g Chew Generic drug:  Inulin Chew 5 tablets by mouth daily. FIBER CHOICE   Fish Oil 1000 MG Caps Take 1 capsule by mouth 3 (three) times  daily.   fluticasone 50 MCG/ACT nasal spray Commonly known as:  FLONASE Place 2 sprays into both nostrils daily.   hydrALAZINE 10 MG tablet Commonly known as:  APRESOLINE TAKE 1 TABLET (10 MG TOTAL) BY MOUTH EVERY 8 (EIGHT) HOURS.   Integra 62.5-62.5-40-3 MG Caps Take 1 capsule by mouth daily.   metoCLOPramide 10 MG tablet Commonly known as:  REGLAN TAKE 1 TABLET BY MOUTH EVERYDAY AT BEDTIME   mirabegron ER 50 MG Tb24 tablet Commonly known as:  Myrbetriq Take 1 tablet (50 mg total) by mouth daily.   omeprazole 20 MG capsule Commonly known as:  PRILOSEC TAKE 1-2 CAPSULES (20-40 MG TOTAL) BY MOUTH DAILY.   rosuvastatin 5 MG tablet Commonly known as:  CRESTOR TAKE 1 TABLET ON MON, WED, FRIDAY   SB Low Dose ASA EC 81 MG EC tablet Generic drug:  aspirin Take 81 mg by mouth daily.   SYSTANE OP Apply 1-2 drops to eye daily as needed (FOR DRY EYE).   traMADol 50 MG tablet Commonly known as:  ULTRAM Take 1 tablet (50 mg total) by mouth 2 (two) times daily.   valACYclovir 1000 MG tablet Commonly known as:  VALTREX Take 1 tablet (1,000 mg total) by mouth 3 (three) times daily.   Vitamin D 50 MCG (2000 UT) Caps Take by mouth as directed. Take 1 tablet daily except none on saturdays and sundays           Allergies (Verified)    Prevnar 13 [pneumococcal 13-val conj vacc]; Penicillins; Tetanus toxoids; Codeine; and Sulfonamide derivatives  Past Medical History Past Medical History:  Diagnosis Date  . Acid reflux   . Anemia   . Anxiety   . Cataracts, bilateral   . Celiac disease/sprue   . Chronic gastritis   . Esophageal stricture   . Euthyroid   . Gastritis   . Hiatal hernia   . Hyperlipidemia   . Hypermobility syndrome   . 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 Neuropathy   . Osteoporosis   . Palpitations   . Postmenopausal HRT (hormone replacement therapy)   . Shingles    x 3  . Thyroid tumor, benign      Past Surgical History:  Procedure  Laterality Date  . ABDOMINAL HYSTERECTOMY    . APPENDECTOMY  1935  . CATARACT EXTRACTION Bilateral   . COLONOSCOPY    . ESOPHAGOGASTRODUODENOSCOPY    . EYE SURGERY    . MYOMECTOMY    . PARTIAL HYSTERECTOMY  1974   w 1/2 right ovary   . RETINAL LASER PROCEDURE    . SIGMOIDOSCOPY    . THYROIDECTOMY  1975   rt. benign tumor     Social History   Socioeconomic History  . Marital status: Widowed    Spouse  name: Not on file  . Number of children: 0  . Years of education: 16  . Highest education level: Bachelor's degree (e.g., BA, AB, BS)  Occupational History  . Occupation: retired    Fish farm manager: RETIRED  Social Needs  . Financial resource strain: Not very hard  . Food insecurity:    Worry: Never true    Inability: Never true  . Transportation needs:    Medical: No    Non-medical: No  Tobacco Use  . Smoking status: Never Smoker  . Smokeless tobacco: Never Used  Substance and Sexual Activity  . Alcohol use: No  . Drug use: No  . Sexual activity: Not Currently  Lifestyle  . Physical activity:    Days per week: 3 days    Minutes per session: 10 min  . Stress: Only a little  Relationships  . Social connections:    Talks on phone: More than three times a week    Gets together: More than three times a week    Attends religious service: More than 4 times per year    Active member of club or organization: Yes    Attends meetings of clubs or organizations: More than 4 times per year    Relationship status: Widowed  Other Topics Concern  . 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 Healthy Mother   . Lung cancer Father   . Colon cancer Neg Hx   . Stomach cancer Neg Hx   . Esophageal cancer Neg Hx       Labs/Other Tests and Data Reviewed:    Wt Readings from Last 3 Encounters:  03/11/19 100 lb (45.4  kg)  01/27/19 104 lb (47.2 kg)  01/10/19 106 lb (48.1 kg)   Temp Readings from Last 3 Encounters:  01/27/19 97.7 F (36.5 C) (Oral)  01/10/19 (!) 97 F (36.1 C) (Oral)  11/14/18 97.8 F (36.6 C) (Oral)   BP Readings from Last 3 Encounters:  01/27/19 138/84  01/10/19 123/77  11/14/18 126/70   Pulse Readings from Last 3 Encounters:  01/27/19 80  01/10/19 88  11/14/18 72     No results found for: HGBA1C Lab Results  Component Value Date   LDLCALC 74 09/11/2018   CREATININE 0.75 09/11/2018       Chemistry      Component Value Date/Time   NA 138 09/11/2018 1137   K 4.8 09/11/2018 1137   CL 99 09/11/2018 1137   CO2 25 09/11/2018 1137   BUN 11 09/11/2018 1137   CREATININE 0.75 09/11/2018 1137   CREATININE 0.65 06/03/2013 1200      Component Value Date/Time   CALCIUM 9.8 09/11/2018 1137   ALKPHOS 42 09/11/2018 1137   AST 17 09/11/2018 1137   ALT 16 09/11/2018 1137   BILITOT 0.2 09/11/2018 1137         OBSERVATIONS/ OBJECTIVE:     The patient is extremely alert and responds very quickly and appropriately to questions asked of her.  Her weight runs between 101 106.  Currently it is 100.  She does not check blood pressures at home but will get her next-door neighbor who is Eldred Manges and an ER nurse and paramedic to check her blood pressure periodically.  She  will continue to follow-up periodically with Dr. Carlean Purl.  Physical exam deferred due to nature of telephonic visit.  ASSESSMENT & PLAN    Time:   Today, I have spent 23 minutes with the patient via telephone discussing the above including Covid precautions.     Visit Diagnoses: 1. Vitamin D deficiency -Patient will continue current dose of vitamin D pending results of lab work  2. Postmenopausal -She walks regularly has her Lifeline on her and usually walks with someone and tries to walk every day.  No history of any falls.  3. Mixed hyperlipidemia -Continue therapeutic lifestyle changes and  Crestor  4. Gastroesophageal reflux disease, esophagitis presence not specified -Continue with omeprazole and Reglan as currently doing and follow-up with Dr. Carlean Purl as planned  5. Osteoarthritis of spine with radiculopathy, lumbar region -Take Tylenol as needed for pain  6. Overactive bladder -Continue with Myrbetriq  7.  Essential hypertension -Continue with amlodipine milligrams daily -Continue with hydralazine 10 mg twice daily  Patient Instructions  Continue current treatment Continue to practice good hand and respiratory hygiene Continue to drink plenty of fluids and stay well-hydrated Make every effort to move slowly so as not to put yourself at risk for falling and avoid climbing Come to the office and get blood drawn and come at the safest time and my nurse will discuss that time with you.     The above assessment and management plan was discussed with the patient. The patient verbalized understanding of and has agreed to the management plan. Patient is aware to call the clinic if symptoms persist or worsen. Patient is aware when to return to the clinic for a follow-up visit. Patient educated on when it is appropriate to go to the emergency department.    Kristi Herb, MD [address], [place], [place] [address] Ph [phone]   Arrie Senate MD

## 2019-04-16 NOTE — Telephone Encounter (Signed)
May I refill Sir? Thank you.

## 2019-04-16 NOTE — Telephone Encounter (Signed)
Pharmacy called said that the patient needs a refill for metoCLOPramide (REGLAN) 10 MG tablet

## 2019-04-16 NOTE — Patient Instructions (Addendum)
Continue current treatment Continue to practice good hand and respiratory hygiene Continue to drink plenty of fluids and stay well-hydrated Make every effort to move slowly so as not to put yourself at risk for falling and avoid climbing Come to the office and get blood drawn and come at the safest time and my nurse will discuss that time with you.

## 2019-04-16 NOTE — Telephone Encounter (Signed)
Aware of lab orders

## 2019-04-16 NOTE — Telephone Encounter (Signed)
As written in last note ok to continue May refill x 6

## 2019-04-16 NOTE — Addendum Note (Signed)
Addended by: Zannie Cove on: 04/16/2019 03:49 PM   Modules accepted: Orders

## 2019-04-29 ENCOUNTER — Other Ambulatory Visit: Payer: Medicare Other

## 2019-04-29 ENCOUNTER — Telehealth: Payer: Self-pay | Admitting: *Deleted

## 2019-04-29 ENCOUNTER — Other Ambulatory Visit: Payer: Self-pay

## 2019-04-29 DIAGNOSIS — E559 Vitamin D deficiency, unspecified: Secondary | ICD-10-CM

## 2019-04-29 DIAGNOSIS — M4726 Other spondylosis with radiculopathy, lumbar region: Secondary | ICD-10-CM

## 2019-04-29 DIAGNOSIS — E782 Mixed hyperlipidemia: Secondary | ICD-10-CM

## 2019-04-29 DIAGNOSIS — Z0000 Encounter for general adult medical examination without abnormal findings: Secondary | ICD-10-CM

## 2019-04-29 DIAGNOSIS — R6889 Other general symptoms and signs: Secondary | ICD-10-CM | POA: Diagnosis not present

## 2019-04-29 DIAGNOSIS — Z780 Asymptomatic menopausal state: Secondary | ICD-10-CM

## 2019-04-29 DIAGNOSIS — K219 Gastro-esophageal reflux disease without esophagitis: Secondary | ICD-10-CM

## 2019-04-29 DIAGNOSIS — M810 Age-related osteoporosis without current pathological fracture: Secondary | ICD-10-CM | POA: Diagnosis not present

## 2019-04-29 DIAGNOSIS — N3281 Overactive bladder: Secondary | ICD-10-CM

## 2019-04-29 DIAGNOSIS — I10 Essential (primary) hypertension: Secondary | ICD-10-CM | POA: Diagnosis not present

## 2019-04-29 DIAGNOSIS — E785 Hyperlipidemia, unspecified: Secondary | ICD-10-CM | POA: Diagnosis not present

## 2019-04-29 LAB — LIPID PANEL

## 2019-04-29 NOTE — Telephone Encounter (Signed)
PROLIA: Summary of Benefits Interpretation  April 29, 2019     Purchase Information  Last purchase location: [facility] [employer] . Primary: AARP Medicare       Secondary:   Summary of Benefits  . Received on: 04/19/2019 . Estimated Cost to Patient: $0 . Prior authorization required: No   . Physician Purchase Covered: Yes    Patient notified of $0 charge for Prolia injection.  Scheduled her to come in for injection 04/30/2019.   Hulen Skains, Bryden Darden M   04/29/2019 [place] [phone]

## 2019-04-30 ENCOUNTER — Ambulatory Visit (INDEPENDENT_AMBULATORY_CARE_PROVIDER_SITE_OTHER): Payer: Medicare Other | Admitting: *Deleted

## 2019-04-30 DIAGNOSIS — M810 Age-related osteoporosis without current pathological fracture: Secondary | ICD-10-CM | POA: Diagnosis not present

## 2019-04-30 LAB — CBC WITH DIFFERENTIAL/PLATELET
Basophils Absolute: 0.1 10*3/uL (ref 0.0–0.2)
Basos: 1 %
EOS (ABSOLUTE): 0.2 10*3/uL (ref 0.0–0.4)
Eos: 2 %
Hematocrit: 41.3 % (ref 34.0–46.6)
Hemoglobin: 14.5 g/dL (ref 11.1–15.9)
Immature Grans (Abs): 0 10*3/uL (ref 0.0–0.1)
Immature Granulocytes: 0 %
Lymphocytes Absolute: 5.6 10*3/uL — ABNORMAL HIGH (ref 0.7–3.1)
Lymphs: 53 %
MCH: 32.4 pg (ref 26.6–33.0)
MCHC: 35.1 g/dL (ref 31.5–35.7)
MCV: 92 fL (ref 79–97)
Monocytes Absolute: 0.9 10*3/uL (ref 0.1–0.9)
Monocytes: 9 %
Neutrophils Absolute: 3.6 10*3/uL (ref 1.4–7.0)
Neutrophils: 35 %
Platelets: 232 10*3/uL (ref 150–450)
RBC: 4.47 x10E6/uL (ref 3.77–5.28)
RDW: 12.5 % (ref 11.7–15.4)
WBC: 10.4 10*3/uL (ref 3.4–10.8)

## 2019-04-30 LAB — THYROID PANEL WITH TSH
Free Thyroxine Index: 2.7 (ref 1.2–4.9)
T3 Uptake Ratio: 28 % (ref 24–39)
T4, Total: 9.8 ug/dL (ref 4.5–12.0)
TSH: 4.23 u[IU]/mL (ref 0.450–4.500)

## 2019-04-30 LAB — VITAMIN D 25 HYDROXY (VIT D DEFICIENCY, FRACTURES): Vit D, 25-Hydroxy: 57.4 ng/mL (ref 30.0–100.0)

## 2019-04-30 LAB — HEPATIC FUNCTION PANEL
ALT: 17 IU/L (ref 0–32)
AST: 19 IU/L (ref 0–40)
Albumin: 4.4 g/dL (ref 3.5–4.6)
Alkaline Phosphatase: 34 IU/L — ABNORMAL LOW (ref 39–117)
Bilirubin Total: 0.3 mg/dL (ref 0.0–1.2)
Bilirubin, Direct: 0.08 mg/dL (ref 0.00–0.40)
Total Protein: 6.4 g/dL (ref 6.0–8.5)

## 2019-04-30 LAB — BMP8+EGFR
BUN/Creatinine Ratio: 20 (ref 12–28)
BUN: 13 mg/dL (ref 10–36)
CO2: 22 mmol/L (ref 20–29)
Calcium: 9.5 mg/dL (ref 8.7–10.3)
Chloride: 101 mmol/L (ref 96–106)
Creatinine, Ser: 0.66 mg/dL (ref 0.57–1.00)
GFR calc Af Amer: 90 mL/min/{1.73_m2} (ref >59)
GFR calc non Af Amer: 78 mL/min/{1.73_m2} (ref >59)
Glucose: 109 mg/dL — ABNORMAL HIGH (ref 65–99)
Potassium: 4.4 mmol/L (ref 3.5–5.2)
Sodium: 139 mmol/L (ref 134–144)

## 2019-04-30 LAB — LIPID PANEL
Chol/HDL Ratio: 2.1 ratio (ref 0.0–4.4)
Cholesterol, Total: 174 mg/dL (ref 100–199)
HDL: 83 mg/dL (ref >39)
LDL Calculated: 76 mg/dL (ref 0–99)
Triglycerides: 73 mg/dL (ref 0–149)
VLDL Cholesterol Cal: 15 mg/dL (ref 5–40)

## 2019-04-30 LAB — VITAMIN B12: Vitamin B-12: 1800 pg/mL — ABNORMAL HIGH (ref 232–1245)

## 2019-04-30 MED ORDER — DENOSUMAB 60 MG/ML ~~LOC~~ SOSY
60.0000 mg | PREFILLED_SYRINGE | Freq: Once | SUBCUTANEOUS | Status: AC
  Administered 2019-04-30: 60 mg via SUBCUTANEOUS

## 2019-04-30 NOTE — Progress Notes (Signed)
Pt given Prolia inj [facility] and bill [facility] well

## 2019-05-04 ENCOUNTER — Other Ambulatory Visit: Payer: Self-pay | Admitting: Family Medicine

## 2019-05-05 ENCOUNTER — Other Ambulatory Visit: Payer: Self-pay

## 2019-05-05 ENCOUNTER — Ambulatory Visit (INDEPENDENT_AMBULATORY_CARE_PROVIDER_SITE_OTHER): Payer: Medicare Other | Admitting: *Deleted

## 2019-05-05 ENCOUNTER — Encounter: Payer: Self-pay | Admitting: *Deleted

## 2019-05-05 DIAGNOSIS — Z0000 Encounter for general adult medical examination without abnormal findings: Secondary | ICD-10-CM

## 2019-05-05 NOTE — Patient Instructions (Signed)
Preventive Care 83 Years and Older, Female Preventive care refers to lifestyle choices and visits with your health care provider that can promote health and wellness. What does preventive care include?  A yearly physical exam. This is also called an annual well check.  Dental exams once or twice a year.  Routine eye exams. Ask your health care provider how often you should have your eyes checked.  Personal lifestyle choices, including: ? Daily care of your teeth and gums. ? Regular physical activity. ? Eating a healthy diet. ? Avoiding tobacco and drug use. ? Limiting alcohol use. ? Practicing safe sex. ? Taking low-dose aspirin every day. ? Taking vitamin and mineral supplements as recommended by your health care provider. What happens during an annual well check? The services and screenings done by your health care provider during your annual well check will depend on your age, overall health, lifestyle risk factors, and family history of disease. Counseling Your health care provider may ask you questions about your:  Alcohol use.  Tobacco use.  Drug use.  Emotional well-being.  Home and relationship well-being.  Sexual activity.  Eating habits.  History of falls.  Memory and ability to understand (cognition).  Work and work Statistician.  Reproductive health.  Screening You may have the following tests or measurements:  Height, weight, and BMI.  Blood pressure.  Lipid and cholesterol levels. These may be checked every 5 years, or more frequently if you are over 30 years old.  Skin check.  Lung cancer screening. You may have this screening every year starting at age 27 if you have a 30-pack-year history of smoking and currently smoke or have quit within the past 15 years.  Colorectal cancer screening. All adults should have this screening starting at age 33 and continuing until age 46. You will have tests every 1-10 years, depending on your results and the  type of screening test. People at increased risk should start screening at an earlier age. Screening tests may include: ? Guaiac-based fecal occult blood testing. ? Fecal immunochemical test (FIT). ? Stool DNA test. ? Virtual colonoscopy. ? Sigmoidoscopy. During this test, a flexible tube with a tiny camera (sigmoidoscope) is used to examine your rectum and lower colon. The sigmoidoscope is inserted through your anus into your rectum and lower colon. ? Colonoscopy. During this test, a long, thin, flexible tube with a tiny camera (colonoscope) is used to examine your entire colon and rectum.  Hepatitis C blood test.  Hepatitis B blood test.  Sexually transmitted disease (STD) testing.  Diabetes screening. This is done by checking your blood sugar (glucose) after you have not eaten for a while (fasting). You may have this done every 1-3 years.  Bone density scan. This is done to screen for osteoporosis. You may have this done starting at age 37.  Mammogram. This may be done every 1-2 years. Talk to your health care provider about how often you should have regular mammograms. Talk with your health care provider about your test results, treatment options, and if necessary, the need for more tests. Vaccines Your health care provider may recommend certain vaccines, such as:  Influenza vaccine. This is recommended every year.  Tetanus, diphtheria, and acellular pertussis (Tdap, Td) vaccine. You may need a Td booster every 10 years.  Varicella vaccine. You may need this if you have not been vaccinated.  Zoster vaccine. You may need this after age 38.  Measles, mumps, and rubella (MMR) vaccine. You may need at least  one dose of MMR if you were born in 1957 or later. You may also need a second dose.  Pneumococcal 13-valent conjugate (PCV13) vaccine. One dose is recommended after age 24.  Pneumococcal polysaccharide (PPSV23) vaccine. One dose is recommended after age 24.  Meningococcal  vaccine. You may need this if you have certain conditions.  Hepatitis A vaccine. You may need this if you have certain conditions or if you travel or work in places where you may be exposed to hepatitis A.  Hepatitis B vaccine. You may need this if you have certain conditions or if you travel or work in places where you may be exposed to hepatitis B.  Haemophilus influenzae type b (Hib) vaccine. You may need this if you have certain conditions. Talk to your health care provider about which screenings and vaccines you need and how often you need them. This information is not intended to replace advice given to you by your health care provider. Make sure you discuss any questions you have with your health care provider. Document Released: 11/26/2015 Document Revised: 12/20/2017 Document Reviewed: 08/31/2015 Elsevier Interactive Patient Education  2019 [employer].

## 2019-05-05 NOTE — Progress Notes (Addendum)
MEDICARE ANNUAL WELLNESS VISIT  05/05/2019  Telephone Visit Disclaimer This Medicare AWV was conducted by telephone due to national recommendations for restrictions regarding the COVID-19 Pandemic (e.g. social distancing).  I verified, using two identifiers, that I am speaking with Kristi Sawyer or their authorized healthcare agent. I discussed the limitations, risks, security, and privacy concerns of performing an evaluation and management service by telephone and the potential availability of an in-person appointment in the future. The patient expressed understanding and agreed to proceed.   Subjective:  Kristi Sawyer is a 85 y.o. female patient of Chipper Herb, MD who had a Medicare Annual Wellness Visit today via telephone. [name] is Retired and lives alone. she has 0 children. she reports that she is socially active and does interact with friends/family regularly. she is minimally physically active and enjoys reading.  Patient Care Team: Chipper Herb, MD as PCP - General Carlean Purl Ofilia Neas, MD as Consulting Physician (Gastroenterology) Hurman Horn, MD as Consulting Physician (Ophthalmology)  Advanced Directives 05/05/2019 05/01/2018 02/01/2018 10/27/2017 10/26/2017 04/16/2017 07/18/2016  Does Patient Have a Medical Advance Directive? No No No Yes Yes Yes Yes  Type of Advance Directive - - - Out of facility DNR (pink MOST or yellow form);Living will;Healthcare Power of [place];Living will [place];Living will -  Does patient want to make changes to medical advance directive? - - - No - Patient declined - No - Patient declined -  Copy of [place] in Chart? - - - No - copy requested - Yes -  Would patient like information on creating a medical advance directive? No - Patient declined No - Patient declined No - Patient declined - - - [facility] Utilization Over the Past 12 Months: # of hospitalizations or ER visits:  0 # of surgeries: 0  Review of Systems    Patient reports that her overall health is unchanged compared to last year.  Patient Reported Readings (BP, Pulse, CBG, Weight, etc) none  Review of Systems: No complaints  All other systems negative.  Pain Assessment Pain : No/denies pain     Current Medications & Allergies (verified) Allergies as of 05/05/2019      Reactions   Prevnar 13 [pneumococcal 13-val Conj Vacc] Hives   Red/streaking arm   Penicillins Hives   Has patient had a PCN reaction causing immediate rash, facial/tongue/throat swelling, SOB or lightheadedness with hypotension: No Has patient had a PCN reaction causing severe rash involving mucus membranes or skin necrosis: No Has patient had a PCN reaction that required hospitalization: No Has patient had a PCN reaction occurring within the last 10 years: No If all of the above answers are "NO", then may proceed with Cephalosporin use.   Tetanus Toxoids Other (See Comments)   Area of injection was red and streaked.   Codeine Nausea Only   Sulfonamide Derivatives Nausea Only      Medication List       Accurate as of May 05, 2019  2:32 PM. If you have any questions, ask your nurse or doctor.        STOP taking these medications   diclofenac sodium 1 % Gel Commonly known as: VOLTAREN Stopped by: WYATT, AMY M, RN   traMADol 50 MG tablet Commonly known as: ULTRAM Stopped by: WYATT, AMY M, RN   valACYclovir 1000 MG tablet Commonly known as: VALTREX Stopped by: Denyce Robert, RN  TAKE these medications   amLODipine 5 MG tablet Commonly known as: NORVASC TAKE 1 TABLET BY MOUTH EVERY DAY   CALTRATE PLUS PO Take 1 tablet by mouth daily.   CENTRUM SILVER PO Take 1 tablet by mouth daily.   Fiber Choice Fruity Bites 1.5 g Chew Generic drug: Inulin Chew 5 tablets by mouth daily. FIBER CHOICE   Fish Oil 1000 MG Caps Take 1 capsule by mouth 3 (three) times daily.   fluticasone 50 MCG/ACT nasal spray  Commonly known as: FLONASE Place 2 sprays into both nostrils daily.   hydrALAZINE 10 MG tablet Commonly known as: APRESOLINE TAKE 1 TABLET (10 MG TOTAL) BY MOUTH EVERY 8 (EIGHT) HOURS.   Integra 62.5-62.5-40-3 MG Caps Take 1 capsule by mouth daily.   metoCLOPramide 10 MG tablet Commonly known as: REGLAN TAKE 1 TABLET BY MOUTH EVERYDAY AT BEDTIME   mirabegron ER 50 MG Tb24 tablet Commonly known as: Myrbetriq Take 1 tablet (50 mg total) by mouth daily.   omeprazole 20 MG capsule Commonly known as: PRILOSEC TAKE 1-2 CAPSULES (20-40 MG TOTAL) BY MOUTH DAILY.   rosuvastatin 5 MG tablet Commonly known as: CRESTOR TAKE 1 TABLET ON MON, WED, FRIDAY   SB Low Dose ASA EC 81 MG EC tablet Generic drug: aspirin Take 81 mg by mouth daily.   SYSTANE OP Apply 1-2 drops to eye daily as needed (FOR DRY EYE).   Vitamin D 50 MCG (2000 UT) Caps Take by mouth as directed. Take 1 tablet daily except none on saturdays and sundays       History (reviewed): Past Medical History:  Diagnosis Date  . Acid reflux   . Anemia   . Anxiety   . Cataracts, bilateral   . Celiac disease/sprue   . Chronic gastritis   . Esophageal stricture   . Euthyroid   . Gastritis   . Hiatal hernia   . Hyperlipidemia   . Hypermobility syndrome   . 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 Neuropathy   . Osteoporosis   . Palpitations   . Postmenopausal HRT (hormone replacement therapy)   . Shingles    x 3  . Thyroid tumor, benign    Past Surgical History:  Procedure Laterality Date  . ABDOMINAL HYSTERECTOMY    . APPENDECTOMY  1935  . CATARACT EXTRACTION Bilateral   . COLONOSCOPY    . ESOPHAGOGASTRODUODENOSCOPY    . EYE SURGERY    . MYOMECTOMY    . PARTIAL HYSTERECTOMY  1974   w 1/2 right ovary   . RETINAL LASER PROCEDURE    . SIGMOIDOSCOPY    . THYROIDECTOMY  1975   rt. benign tumor    Family History  Problem Relation Age of Onset  . Healthy Mother   . Lung cancer Father   .  Colon cancer Neg Hx   . Stomach cancer Neg Hx   . Esophageal cancer Neg Hx    Social History   Socioeconomic History  . Marital status: Widowed    Spouse name: Not on file  . Number of children: 0  . Years of education: 16  . Highest education level: Bachelor's degree (e.g., BA, AB, BS)  Occupational History  . Occupation: retired    Fish farm manager: RETIRED  Social Needs  . Financial resource strain: Not very hard  . Food insecurity    Worry: Never true    Inability: Never true  . Transportation needs    Medical: No    Non-medical:  No  Tobacco Use  . Smoking status: Never Smoker  . Smokeless tobacco: Never Used  Substance and Sexual Activity  . Alcohol use: No  . Drug use: No  . Sexual activity: Not Currently  Lifestyle  . Physical activity    Days per week: 3 days    Minutes per session: 10 min  . Stress: Only a little  Relationships  . Social connections    Talks on phone: More than three times a week    Gets together: More than three times a week    Attends religious service: More than 4 times per year    Active member of club or organization: Yes    Attends meetings of clubs or organizations: More than 4 times per year    Relationship status: Widowed  Other Topics Concern  . 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 N  Vision? N  Difficulty concentrating or making decisions? N  Walking or climbing stairs? N  Dressing or bathing? N  Doing errands, shopping? N  Preparing Food and eating ? N  Using the Toilet? N  In the past six months, have you accidently leaked urine? Y  Comment she wears a pad all the time  Do you have problems with loss of bowel control? N  Managing your Medications? N  Managing your  Finances? N  Housekeeping or managing your Housekeeping? N  Some recent data might be hidden    Patient Literacy How often do you need to have someone help you when you read instructions, pamphlets, or other written materials from your doctor or pharmacy?: 1 - Never What is the last grade level you completed in school?: Bachelors Degree  Exercise Current Exercise Habits: Home exercise routine, Type of exercise: walking, Time (Minutes): 15, Frequency (Times/Week): 4, Weekly Exercise (Minutes/Week): 60, Intensity: Mild, Exercise limited by: Other - see comments(pt states she gets "unsteady" at times and she will only walk when she has someone with her)  Diet Patient reports consuming 2 meals a day and 1 snack(s) a day Patient reports that her primary diet is: Regular Patient reports that she does have regular access to food.   Depression Screen PHQ 2/9 Scores 05/05/2019 01/27/2019 11/14/2018 09/11/2018 05/10/2018 05/01/2018 12/26/2017  PHQ - 2 Score 0 0 0 0 0 0 0     Fall Risk Fall Risk  05/05/2019 01/27/2019 11/14/2018 10/03/2018 09/11/2018  Falls in the past year? 0 0 0 0 No  Comment - - - Emmi Telephone Survey: data to providers prior to load -  Number falls in past yr: - - - - -  Injury with Fall? - - - - -  Risk Factor Category  - - - - -  Risk for fall due to : - - - - -  Follow up - - - - -     Objective:  Kristi Sawyer seemed alert and oriented and she participated appropriately during our telephone visit.  Blood Pressure Weight BMI  BP Readings from Last 3 Encounters:  01/27/19 138/84  01/10/19 123/77  11/14/18 126/70   Wt Readings from Last 3 Encounters:  03/11/19 100 lb (45.4 kg)  01/27/19 104  lb (47.2 kg)  01/10/19 106 lb (48.1 kg)   BMI Readings from Last 1 Encounters:  03/11/19 19.53 kg/m    *Unable to obtain current vital signs, weight, and BMI due to telephone visit type  Hearing/Vision  . Linder did not seem to have difficulty with hearing/understanding during  the telephone conversation . Reports that she has not had a formal eye exam by an eye care professional within the past year . Reports that she has not had a formal hearing evaluation within the past year *Unable to fully assess hearing and vision during telephone visit type  Cognitive Function: 6CIT Screen 05/05/2019  What Year? 0 points  What month? 0 points  What time? 0 points  Count back from 20 0 points  Months in reverse 0 points  Repeat phrase 0 points  Total Score 0   (Normal:0-7, Significant for Dysfunction: >8)  Normal Cognitive Function Screening: Yes   Immunization & Health Maintenance Record Immunization History  Administered Date(s) Administered  . Influenza, High Dose Seasonal PF 08/07/2017, 09/11/2018  . Influenza,inj,Quad PF,6+ Mos 08/04/2013, 08/18/2014, 09/16/2015, 08/22/2016  . Influenza-Unspecified 08/11/2009, 08/04/2010, 09/13/2011, 09/16/2015  . Pneumococcal Conjugate-13 11/05/2013  . Pneumococcal Polysaccharide-23 11/14/1995  . Zoster 12/27/2010    Health Maintenance  Topic Date Due  . MAMMOGRAM  05/11/2019 (Originally 05/07/2019)  . INFLUENZA VACCINE  06/14/2019  . DEXA SCAN  09/11/2020  . TETANUS/TDAP  06/04/2023  . PNA vac Low Risk Adult  Completed       Assessment  This is a routine wellness examination for Kristi Sawyer.  Health Maintenance: Due or Overdue There are no preventive care reminders to display for this patient.  Kristi Sawyer does not need a referral for Community Assistance: Care Management:   no Social Work:    no Prescription Assistance:  no Nutrition/Diabetes Education:  no   Plan:  Personalized Goals Goals Addressed            This Visit's Progress   . DIET - INCREASE LEAN PROTEINS        Personalized Health Maintenance & Screening Recommendations  Advanced directives: has NO advanced directive - not interested in additional information  Lung Cancer Screening Recommended: no (Low Dose CT Chest recommended  if Age 72-80 years, 30 pack-year currently smoking OR have quit w/in past 15 years) Hepatitis C Screening recommended: no HIV Screening recommended: no  Advanced Directives: Written information was not prepared per patient's request.  Referrals & Orders No orders of the defined types were placed in this encounter.   Follow-up Plan . Follow-up with Chipper Herb, MD as planned    I have personally reviewed and noted the following in the patient's chart:   . Medical and social history . Use of alcohol, tobacco or illicit drugs  . Current medications and supplements . Functional ability and status . Nutritional status . Physical activity . Advanced directives . List of other physicians . Hospitalizations, surgeries, and ER visits in previous 12 months . Vitals . Screenings to include cognitive, depression, and falls . Referrals and appointments  In addition, I have reviewed and discussed with Kristi Sawyer certain preventive protocols, quality metrics, and best practice recommendations. A written personalized care plan for preventive services as well as general preventive health recommendations is available and can be mailed to the patient at her request.      Marylin Crosby, LPN  11/13/7508   I have reviewed and agree with the above AWV documentation.  Mary-Margaret Hassell Done, FNP

## 2019-05-12 DIAGNOSIS — Z1231 Encounter for screening mammogram for malignant neoplasm of breast: Secondary | ICD-10-CM | POA: Diagnosis not present

## 2019-05-14 ENCOUNTER — Other Ambulatory Visit: Payer: Self-pay | Admitting: Family Medicine

## 2019-06-02 ENCOUNTER — Other Ambulatory Visit: Payer: Self-pay | Admitting: Internal Medicine

## 2019-07-14 DIAGNOSIS — Z23 Encounter for immunization: Secondary | ICD-10-CM | POA: Diagnosis not present

## 2019-07-29 ENCOUNTER — Telehealth: Payer: Self-pay

## 2019-07-29 MED ORDER — INTEGRA 62.5-62.5-40-3 MG PO CAPS: 1.0000 | ORAL_CAPSULE | Freq: Every day | ORAL | 11 refills | Status: DC

## 2019-07-29 NOTE — Telephone Encounter (Signed)
Integra refilled as approved.

## 2019-07-29 NOTE — Telephone Encounter (Signed)
May I refill her Kristi Sawyer, pharmacy requested.

## 2019-07-29 NOTE — Telephone Encounter (Signed)
X 1 year

## 2019-07-31 ENCOUNTER — Telehealth: Payer: Self-pay | Admitting: Family Medicine

## 2019-07-31 NOTE — Telephone Encounter (Signed)
This office does not have any Myrbetriq samples at this time.  Left detailed message

## 2019-08-19 ENCOUNTER — Telehealth: Payer: Self-pay | Admitting: Family Medicine

## 2019-08-19 NOTE — Telephone Encounter (Signed)
Patient aware no samples.

## 2019-08-31 ENCOUNTER — Other Ambulatory Visit: Payer: Self-pay | Admitting: Family Medicine

## 2019-09-22 ENCOUNTER — Other Ambulatory Visit: Payer: Self-pay | Admitting: Family Medicine

## 2019-10-29 ENCOUNTER — Encounter: Payer: Self-pay | Admitting: Family Medicine

## 2019-10-29 ENCOUNTER — Other Ambulatory Visit: Payer: Self-pay | Admitting: Family Medicine

## 2019-10-29 ENCOUNTER — Ambulatory Visit (INDEPENDENT_AMBULATORY_CARE_PROVIDER_SITE_OTHER): Payer: Medicare Other | Admitting: Family Medicine

## 2019-10-29 DIAGNOSIS — I10 Essential (primary) hypertension: Secondary | ICD-10-CM | POA: Diagnosis not present

## 2019-10-29 DIAGNOSIS — E782 Mixed hyperlipidemia: Secondary | ICD-10-CM

## 2019-10-29 DIAGNOSIS — K219 Gastro-esophageal reflux disease without esophagitis: Secondary | ICD-10-CM | POA: Diagnosis not present

## 2019-10-29 MED ORDER — ROSUVASTATIN CALCIUM 5 MG PO TABS: ORAL_TABLET | ORAL | 3 refills | Status: DC

## 2019-10-29 MED ORDER — AMLODIPINE BESYLATE 5 MG PO TABS: 5.0000 mg | ORAL_TABLET | Freq: Every day | ORAL | 3 refills | Status: DC

## 2019-10-29 MED ORDER — OMEPRAZOLE 20 MG PO CPDR: 20.0000 mg | DELAYED_RELEASE_CAPSULE | Freq: Every day | ORAL | 3 refills | Status: DC

## 2019-10-29 MED ORDER — HYDRALAZINE HCL 10 MG PO TABS: 10.0000 mg | ORAL_TABLET | Freq: Three times a day (TID) | ORAL | 3 refills | Status: DC

## 2019-10-29 NOTE — Progress Notes (Signed)
Virtual Visit via telephone Note  I connected with Kristi Sawyer on 10/29/19 at 1022 by telephone and verified that I am speaking with the correct person using two identifiers. Kristi Sawyer is currently located at home and no other people are currently with her during visit. The provider, Fransisca Kaufmann Tiwatope Emmitt, MD is located in their office at time of visit.  Call ended at 1032  I discussed the limitations, risks, security and privacy concerns of performing an evaluation and management service by telephone and the availability of in person appointments. I also discussed with the patient that there may be a patient responsible charge related to this service. The patient expressed understanding and agreed to proceed.  120/80 History and Present Illness: Hypertension Patient is currently on amlodipine and hydralazine, and their blood pressure today is 120/80. Patient denies any lightheadedness or dizziness. Patient denies headaches, blurred vision, chest pains, shortness of breath, or weakness. Denies any side effects from medication and is content with current medication.   Hyperlipidemia Patient is coming in for recheck of his hyperlipidemia. The patient is currently taking crestor. They deny any issues with myalgias or history of liver damage from it. They deny any focal numbness or weakness or chest pain.   GERD Patient is currently on omeprazole.  She denies any major symptoms or abdominal pain or belching or burping. She denies any blood in her stool or lightheadedness or dizziness.   1. Essential hypertension   2. Mixed hyperlipidemia   3. Gastroesophageal reflux disease, unspecified whether esophagitis present     Outpatient Encounter Medications as of 10/29/2019  Medication Sig  . amLODipine (NORVASC) 5 MG tablet Take 1 tablet (5 mg total) by mouth daily.  Marland Kitchen aspirin (SB LOW DOSE ASA EC) 81 MG EC tablet Take 81 mg by mouth daily.    . Calcium Carbonate-Vit D-Min (CALTRATE PLUS PO)  Take 1 tablet by mouth daily.   . Cholecalciferol (VITAMIN D) 2000 UNITS CAPS Take by mouth as directed. Take 1 tablet daily except none on saturdays and sundays  . Fe Fum-FePoly-Vit C-Vit B3 (INTEGRA) 62.5-62.5-40-3 MG CAPS Take 1 tablet by mouth daily.  . fluticasone (FLONASE) 50 MCG/ACT nasal spray Place 2 sprays into both nostrils daily.  . hydrALAZINE (APRESOLINE) 10 MG tablet Take 1 tablet (10 mg total) by mouth every 8 (eight) hours.  . Inulin (FIBER CHOICE FRUITY BITES) 1.5 g CHEW Chew 5 tablets by mouth daily. FIBER CHOICE  . metoCLOPramide (REGLAN) 10 MG tablet TAKE 1 TABLET BY MOUTH EVERYDAY AT BEDTIME  . mirabegron ER (MYRBETRIQ) 50 MG TB24 tablet Take 1 tablet (50 mg total) by mouth daily.  . Multiple Vitamins-Minerals (CENTRUM SILVER PO) Take 1 tablet by mouth daily.   . Omega-3 Fatty Acids (FISH OIL) 1000 MG CAPS Take 1 capsule by mouth 3 (three) times daily.   Marland Kitchen omeprazole (PRILOSEC) 20 MG capsule Take 1-2 capsules (20-40 mg total) by mouth daily.  . rosuvastatin (CRESTOR) 5 MG tablet TAKE 1 TABLET BY MOUTH ON MONDAY, WEDNESDAY, AND FRIDAY  . [DISCONTINUED] amLODipine (NORVASC) 5 MG tablet TAKE 1 TABLET BY MOUTH EVERY DAY  . [DISCONTINUED] hydrALAZINE (APRESOLINE) 10 MG tablet TAKE 1 TABLET (10 MG TOTAL) BY MOUTH EVERY 8 (EIGHT) HOURS.  . [DISCONTINUED] omeprazole (PRILOSEC) 20 MG capsule TAKE 1-2 CAPSULES (20-40 MG TOTAL) BY MOUTH DAILY.  . [DISCONTINUED] Polyethyl Glycol-Propyl Glycol (SYSTANE OP) Apply 1-2 drops to eye daily as needed (FOR DRY EYE).   . [DISCONTINUED] rosuvastatin (CRESTOR) 5  MG tablet TAKE 1 TABLET BY MOUTH ON MONDAY, WEDNESDAY, AND FRIDAY   No facility-administered encounter medications on file as of 10/29/2019.    Review of Systems  Constitutional: Negative for chills and fever.  HENT: Negative for congestion, ear discharge and ear pain.   Eyes: Negative for visual disturbance.  Respiratory: Negative for chest tightness and shortness of breath.     Cardiovascular: Negative for chest pain and leg swelling.  Genitourinary: Negative for difficulty urinating and dysuria.  Musculoskeletal: Negative for back pain and gait problem.  Skin: Negative for rash.  Neurological: Negative for light-headedness and headaches.  Psychiatric/Behavioral: Negative for agitation and behavioral problems.  All other systems reviewed and are negative.   Observations/Objective: Patient sounds comfortable and in no acute distress  Assessment and Plan: Problem List Items Addressed This Visit      Cardiovascular and Mediastinum   Hypertension - Primary   Relevant Medications   amLODipine (NORVASC) 5 MG tablet   hydrALAZINE (APRESOLINE) 10 MG tablet   rosuvastatin (CRESTOR) 5 MG tablet     Digestive   GERD (gastroesophageal reflux disease)   Relevant Medications   omeprazole (PRILOSEC) 20 MG capsule     Other   Hyperlipidemia   Relevant Medications   amLODipine (NORVASC) 5 MG tablet   hydrALAZINE (APRESOLINE) 10 MG tablet   rosuvastatin (CRESTOR) 5 MG tablet       Follow Up Instructions: Follow-up in 6 months for recheck hypertension and cholesterol    I discussed the assessment and treatment plan with the patient. The patient was provided an opportunity to ask questions and all were answered. The patient agreed with the plan and demonstrated an understanding of the instructions.   The patient was advised to call back or seek an in-person evaluation if the symptoms worsen or if the condition fails to improve as anticipated.  The above assessment and management plan was discussed with the patient. The patient verbalized understanding of and has agreed to the management plan. Patient is aware to call the clinic if symptoms persist or worsen. Patient is aware when to return to the clinic for a follow-up visit. Patient educated on when it is appropriate to go to the emergency department.    I provided 10 minutes of non-face-to-face time during  this encounter.    Worthy Rancher, MD

## 2019-10-31 ENCOUNTER — Other Ambulatory Visit: Payer: Self-pay

## 2019-11-03 ENCOUNTER — Ambulatory Visit (INDEPENDENT_AMBULATORY_CARE_PROVIDER_SITE_OTHER): Payer: Medicare Other

## 2019-11-03 ENCOUNTER — Other Ambulatory Visit: Payer: Self-pay

## 2019-11-03 DIAGNOSIS — M810 Age-related osteoporosis without current pathological fracture: Secondary | ICD-10-CM | POA: Diagnosis not present

## 2019-11-03 MED ORDER — DENOSUMAB 60 MG/ML ~~LOC~~ SOSY
60.0000 mg | PREFILLED_SYRINGE | Freq: Once | SUBCUTANEOUS | Status: AC
  Administered 2019-11-03: 60 mg via SUBCUTANEOUS

## 2019-11-03 NOTE — Progress Notes (Signed)
Prolia injection to left arm. Patient tolerated well. Buy & bill

## 2019-11-24 ENCOUNTER — Telehealth: Payer: Self-pay | Admitting: Family Medicine

## 2019-11-25 NOTE — Telephone Encounter (Signed)
Patient notified that she is not due until 08/2020

## 2020-01-22 ENCOUNTER — Telehealth: Payer: Self-pay | Admitting: Family Medicine

## 2020-01-22 NOTE — Chronic Care Management (AMB) (Signed)
  Chronic Care Management   Note  01/22/2020 Name: Kristi Sawyer MRN: [id number] DOB: Apr 01, 1926  Kristi Sawyer is a 86 y.o. year old female who is a primary care patient of Dettinger, Fransisca Kaufmann, MD. I reached out to [name] by phone today in response to a referral sent by Kristi Sawyer's health plan.     Kristi Sawyer was given information about Chronic Care Management services today including:  1. CCM service includes personalized support from designated clinical staff supervised by her physician, including individualized plan of care and coordination with other care providers 2. 24/7 contact phone numbers for assistance for urgent and routine care needs. 3. Service will only be billed when office clinical staff spend 20 minutes or more in a month to coordinate care. 4. Only one practitioner may furnish and bill the service in a calendar month. 5. The patient may stop CCM services at any time (effective at the end of the month) by phone call to the office staff. 6. The patient will be responsible for cost sharing (co-pay) of up to 20% of the service fee (after annual deductible is met).  Patient agreed to services and verbal consent obtained.   Follow up plan: Telephone appointment with care management team member scheduled for: 04/27/2020.  [place], [place] [address] Direct Dial: [phone] [email or web address]: [email or web address]

## 2020-04-27 ENCOUNTER — Ambulatory Visit (INDEPENDENT_AMBULATORY_CARE_PROVIDER_SITE_OTHER): Payer: Medicare Other | Admitting: *Deleted

## 2020-04-27 DIAGNOSIS — M810 Age-related osteoporosis without current pathological fracture: Secondary | ICD-10-CM | POA: Diagnosis not present

## 2020-04-27 DIAGNOSIS — I10 Essential (primary) hypertension: Secondary | ICD-10-CM

## 2020-04-27 NOTE — Patient Instructions (Signed)
Visit Information  Goals Addressed            This Visit's Progress   . Chronic Disease Management Needs       CARE PLAN ENTRY (see longtitudinal plan of care for additional care plan information)  Current Barriers:  . Chronic Disease Management support, education, and care coordination needs related to HTN, celiac disease, osteoporosis, HLD  Clinical Goal(s) related to HTN, celiac disease, osteoporosis, HLD:  Over the next 45 days, patient will:  . Work with the care management team to address educational, disease management, and care coordination needs  . Call provider office for new or worsened signs and symptoms . Call care management team with questions or concerns . Verbalize basic understanding of patient centered plan of care established today  Interventions related to HTN, celiac disease, osteoporosis, HLD:  . Evaluation of current treatment plans and patient's adherence to plan as established by provider . Assessed patient understanding of disease states . Assessed patient's education and care coordination needs . Provided disease specific education to patient  . Collaborated with appropriate clinical care team members regarding patient needs o Advised Dr Dettinger of Gabriel Earing powder and ibuprofen use. Asked that he discuss safer alternatives at appt on 04/28/20 . Provided with CCM contact information and encouraged to reach out as needed . Reviewed and updated medication list o Discussed my concerns re: GI irritation and complications with use of Goody Powder and ibuprofen . Reviewed upcomming appointments: Dr Dettinger tomorrow o Recommended she talk with Dr Warrick Parisian about use of ibuprofen and [place] for joint/muscle pain . Discussed osteoporosis management o Prolia injections every 6 months o Advised her to inquire about this at her appt tomorrow o Dexa scan is up-to-date  Patient Self Care Activities related to HTN, celiac disease, osteoporosis, HLD:   . Patient is able to independently perform ADLs and IADLs . Patient needs assistance with managing her chronic medical conditions  Initial goal documentation        Kristi Sawyer was given information about Chronic Care Management services today including:  1. CCM service includes personalized support from designated clinical staff supervised by her physician, including individualized plan of care and coordination with other care providers 2. 24/7 contact phone numbers for assistance for urgent and routine care needs. 3. Service will only be billed when office clinical staff spend 20 minutes or more in a month to coordinate care. 4. Only one practitioner may furnish and bill the service in a calendar month. 5. The patient may stop CCM services at any time (effective at the end of the month) by phone call to the office staff. 6. The patient will be responsible for cost sharing (co-pay) of up to 20% of the service fee (after annual deductible is met).  Patient agreed to services and verbal consent obtained.   The patient verbalized understanding of instructions provided today and declined a print copy of patient instruction materials.   The care management team will reach out to the patient again over the next 45 days.  Next PCP appointment scheduled for: 04/28/20  Chong Sicilian, BSN, RN-BC [facility] Direct Dial: [phone]

## 2020-04-27 NOTE — Chronic Care Management (AMB) (Signed)
Chronic Care Management   Initial Visit Note  04/27/2020 Name: Kristi Sawyer MRN: [id number] DOB: 1926-01-09  Referred by: Kristi Sawyer, Kristi Kaufmann, MD Reason for referral : Chronic Care Management (Initial Visit)   Kristi Sawyer is a 86 y.o. year old female who is a primary care patient of Kristi Sawyer, Kristi Kaufmann, MD. The CCM team was consulted for assistance with chronic disease management and care coordination needs related to HTN, celiac disease, osteoporosis, HLD.  Review of patient status, including review of consultants reports, relevant laboratory and other test results, and collaboration with appropriate care team members and the patient's provider was performed as part of comprehensive patient evaluation and provision of chronic care management services.    Subjective: SDOH (Social Determinants of Health) assessments performed: Yes See Care Plan activities for detailed interventions related to SDOH     Objective: Outpatient Encounter Medications as of 04/27/2020  Medication Sig Note  . Aspirin-Acetaminophen-Caffeine 520-260-32.5 MG PACK Take by mouth. 04/27/2020: Takes PRN for mucle/joint pain. Alternates with ibuprofen  . denosumab (PROLIA) 60 MG/ML SOSY injection Inject 60 mg into the skin every 6 (six) months.   Marland Kitchen ibuprofen (ADVIL) 200 MG tablet Take 200 mg by mouth every 6 (six) hours as needed. 04/27/2020: Takes PRN for muscle/joint pain. Alternates with [employer]  . amLODipine (NORVASC) 5 MG tablet Take 1 tablet (5 mg total) by mouth daily.   Marland Kitchen aspirin (SB LOW DOSE ASA EC) 81 MG EC tablet Take 81 mg by mouth daily.     . Calcium Carbonate-Vit D-Min (CALTRATE PLUS PO) Take 1 tablet by mouth daily.    . Cholecalciferol (VITAMIN D) 2000 UNITS CAPS Take by mouth as directed. Take 1 tablet daily except none on saturdays and sundays   . Fe Fum-FePoly-Vit C-Vit B3 (INTEGRA) 62.5-62.5-40-3 MG CAPS Take 1 tablet by mouth daily.   . fluticasone (FLONASE) 50 MCG/ACT nasal spray Place 2  sprays into both nostrils daily.   . hydrALAZINE (APRESOLINE) 10 MG tablet Take 1 tablet (10 mg total) by mouth every 8 (eight) hours.   . Inulin (FIBER CHOICE FRUITY BITES) 1.5 g CHEW Chew 5 tablets by mouth daily. FIBER CHOICE   . metoCLOPramide (REGLAN) 10 MG tablet TAKE 1 TABLET BY MOUTH EVERYDAY AT BEDTIME   . mirabegron ER (MYRBETRIQ) 50 MG TB24 tablet Take 1 tablet (50 mg total) by mouth daily.   . Multiple Vitamins-Minerals (CENTRUM SILVER PO) Take 1 tablet by mouth daily.    . Omega-3 Fatty Acids (FISH OIL) 1000 MG CAPS Take 1 capsule by mouth 3 (three) times daily.    Marland Kitchen omeprazole (PRILOSEC) 20 MG capsule TAKE 1-2 CAPSULES (20-40 MG TOTAL) BY MOUTH DAILY.   Marland Kitchen omeprazole (PRILOSEC) 20 MG capsule Take 1-2 capsules (20-40 mg total) by mouth daily.   . rosuvastatin (CRESTOR) 5 MG tablet TAKE 1 TABLET BY MOUTH ON MONDAY, WEDNESDAY, AND FRIDAY    No facility-administered encounter medications on file as of 04/27/2020.     RN Care Plan   . Chronic Disease Management Needs       CARE PLAN ENTRY (see longtitudinal plan of care for additional care plan information)  Current Barriers:  . Chronic Disease Management support, education, and care coordination needs related to HTN, celiac disease, osteoporosis, HLD  Clinical Goal(s) related to HTN, celiac disease, osteoporosis, HLD:  Over the next 45 days, patient will:  . Work with the care management team to address educational, disease management, and care coordination needs  . Call  provider office for new or worsened signs and symptoms . Call care management team with questions or concerns . Verbalize basic understanding of patient centered plan of care established today  Interventions related to HTN, celiac disease, osteoporosis, HLD:  . Evaluation of current treatment plans and patient's adherence to plan as established by provider . Assessed patient understanding of disease states . Assessed patient's education and care coordination  needs . Provided disease specific education to patient  . Collaborated with appropriate clinical care team members regarding patient needs o Advised Dr Kristi Sawyer of Gabriel Earing powder and ibuprofen use. Asked that he discuss safer alternatives at appt on 04/28/20 . Provided with CCM contact information and encouraged to reach out as needed . Reviewed and updated medication list o Discussed my concerns re: GI irritation and complications with use of Goody Powder and ibuprofen . Reviewed upcomming appointments: Dr Kristi Sawyer tomorrow o Recommended she talk with Dr Warrick Parisian about use of ibuprofen and [place] for joint/muscle pain . Discussed osteoporosis management o Prolia injections every 6 months o Advised her to inquire about this at her appt tomorrow o Dexa scan is up-to-date  Patient Self Care Activities related to HTN, celiac disease, osteoporosis, HLD:  . Patient is able to independently perform ADLs and IADLs . Patient needs assistance with managing her chronic medical conditions  Initial goal documentation        Plan:   The care management team will reach out to the patient again over the next 45 days.   Chong Sicilian, BSN, RN-BC Embedded Chronic Care Manager [facility] / [facility] Direct Dial: [phone]

## 2020-04-28 ENCOUNTER — Encounter: Payer: Self-pay | Admitting: Family Medicine

## 2020-04-28 ENCOUNTER — Ambulatory Visit (INDEPENDENT_AMBULATORY_CARE_PROVIDER_SITE_OTHER): Payer: Medicare Other | Admitting: Family Medicine

## 2020-04-28 ENCOUNTER — Other Ambulatory Visit: Payer: Self-pay

## 2020-04-28 VITALS — BP 121/70 | HR 80 | Temp 98.2°F | Ht 60.0 in | Wt 98.8 lb

## 2020-04-28 DIAGNOSIS — E782 Mixed hyperlipidemia: Secondary | ICD-10-CM

## 2020-04-28 DIAGNOSIS — M810 Age-related osteoporosis without current pathological fracture: Secondary | ICD-10-CM | POA: Diagnosis not present

## 2020-04-28 DIAGNOSIS — I10 Essential (primary) hypertension: Secondary | ICD-10-CM

## 2020-04-28 NOTE — Progress Notes (Signed)
BP 121/70   Pulse 80   Temp 98.2 F (36.8 C)   Ht 5' (1.524 m)   Wt 98 lb 12.8 oz (44.8 kg)   SpO2 100%   BMI 19.30 kg/m    Subjective:   Patient ID: Kristi Sawyer, female    DOB: December 28, 1925, 86 y.o.   MRN: [id number]  HPI: Kristi Sawyer is a 86 y.o. female presenting on 04/28/2020 for Medical Management of Chronic Issues, Hypertension, and Osteoporosis   HPI Hypertension Patient is currently on amlodipine and hydralazine, and their blood pressure today is 121/70. Patient denies any lightheadedness or dizziness. Patient denies headaches, blurred vision, chest pains, shortness of breath, or weakness. Denies any side effects from medication and is content with current medication.   Hyperlipidemia Patient is coming in for recheck of his hyperlipidemia. The patient is currently taking fish oil and Crestor. They deny any issues with myalgias or history of liver damage from it. They deny any focal numbness or weakness or chest pain.   Osteoporosis/osteopenia Fractures or history of fracture: None Medication: Prolia Duration of treatment: 1 year Last bone density scan: 09/04/2018 Last T score: -3.7  Patient takes a lot of Goody powders and ibuprofen for back pain and due to her age and risk we recommended that she switch to taking more Tylenol and only use those ones as needed and if she needs something more after that then give [place] a call.  Relevant past medical, surgical, family and social history reviewed and updated as indicated. Interim medical history since our last visit reviewed. Allergies and medications reviewed and updated.  Review of Systems  Constitutional: Negative for chills and fever.  Eyes: Negative for visual disturbance.  Respiratory: Negative for chest tightness and shortness of breath.   Cardiovascular: Negative for chest pain and leg swelling.  Musculoskeletal: Negative for back pain and gait problem.  Skin: Negative for rash.  Neurological: Negative for  light-headedness and headaches.  Psychiatric/Behavioral: Negative for agitation and behavioral problems.  All other systems reviewed and are negative.   Per HPI unless specifically indicated above   Allergies as of 04/28/2020      Reactions   Prevnar 13 [pneumococcal 13-val Conj Vacc] Hives   Red/streaking arm   Penicillins Hives   Has patient had a PCN reaction causing immediate rash, facial/tongue/throat swelling, SOB or lightheadedness with hypotension: No Has patient had a PCN reaction causing severe rash involving mucus membranes or skin necrosis: No Has patient had a PCN reaction that required hospitalization: No Has patient had a PCN reaction occurring within the last 10 years: No If all of the above answers are "NO", then may proceed with Cephalosporin use.   Tetanus Toxoids Other (See Comments)   Area of injection was red and streaked.   Codeine Nausea Only   Sulfonamide Derivatives Nausea Only      Medication List       Accurate as of April 28, 2020  1:09 PM. If you have any questions, ask your nurse or doctor.        STOP taking these medications   fluticasone 50 MCG/ACT nasal spray Commonly known as: FLONASE Stopped by: Fransisca Kaufmann Deziya Amero, MD     TAKE these medications   amLODipine 5 MG tablet Commonly known as: NORVASC Take 1 tablet (5 mg total) by mouth daily.   Aspirin-Acetaminophen-Caffeine 520-260-32.5 MG Pack Take by mouth.   CALTRATE PLUS PO Take 1 tablet by mouth daily.   CENTRUM SILVER PO Take  1 tablet by mouth daily.   denosumab 60 MG/ML Sosy injection Commonly known as: PROLIA Inject 60 mg into the skin every 6 (six) months.   Fiber Choice Fruity Bites 1.5 g Chew Generic drug: Inulin Chew 5 tablets by mouth daily. FIBER CHOICE   Fish Oil 1000 MG Caps Take 1 capsule by mouth 3 (three) times daily.   hydrALAZINE 10 MG tablet Commonly known as: APRESOLINE Take 1 tablet (10 mg total) by mouth every 8 (eight) hours.   ibuprofen 200 MG  tablet Commonly known as: ADVIL Take 200 mg by mouth every 6 (six) hours as needed.   Integra 62.5-62.5-40-3 MG Caps Take 1 tablet by mouth daily.   metoCLOPramide 10 MG tablet Commonly known as: REGLAN TAKE 1 TABLET BY MOUTH EVERYDAY AT BEDTIME   mirabegron ER 50 MG Tb24 tablet Commonly known as: Myrbetriq Take 1 tablet (50 mg total) by mouth daily.   omeprazole 20 MG capsule Commonly known as: PRILOSEC TAKE 1-2 CAPSULES (20-40 MG TOTAL) BY MOUTH DAILY. What changed: Another medication with the same name was removed. Continue taking this medication, and follow the directions you see here. Changed by: Fransisca Kaufmann Jamie Belger, MD   rosuvastatin 5 MG tablet Commonly known as: CRESTOR TAKE 1 TABLET BY MOUTH ON MONDAY, WEDNESDAY, AND FRIDAY   SB Low Dose ASA EC 81 MG EC tablet Generic drug: aspirin Take 81 mg by mouth daily.   Vitamin D 50 MCG (2000 UT) Caps Take by mouth as directed. Take 1 tablet daily except none on saturdays and sundays        Objective:   BP 121/70   Pulse 80   Temp 98.2 F (36.8 C)   Ht 5' (1.524 m)   Wt 98 lb 12.8 oz (44.8 kg)   SpO2 100%   BMI 19.30 kg/m   Wt Readings from Last 3 Encounters:  04/28/20 98 lb 12.8 oz (44.8 kg)  03/11/19 100 lb (45.4 kg)  01/27/19 104 lb (47.2 kg)    Physical Exam Vitals and nursing note reviewed.  Constitutional:      General: She is not in acute distress.    Appearance: She is well-developed. She is not diaphoretic.  Eyes:     Conjunctiva/sclera: Conjunctivae normal.  Cardiovascular:     Rate and Rhythm: Normal rate and regular rhythm.     Heart sounds: Normal heart sounds. No murmur heard.   Pulmonary:     Effort: Pulmonary effort is normal. No respiratory distress.     Breath sounds: Normal breath sounds. No wheezing.  Musculoskeletal:        General: No tenderness. Normal range of motion.  Skin:    General: Skin is warm and dry.     Findings: No rash.  Neurological:     Mental Status: She is  alert and oriented to person, place, and time.     Coordination: Coordination normal.  Psychiatric:        Behavior: Behavior normal.       Assessment & Plan:   Problem List Items Addressed This Visit      Cardiovascular and Mediastinum   Hypertension - Primary   Relevant Orders   CBC with Differential/Platelet   CMP14+EGFR   Lipid panel     Musculoskeletal and Integument   Osteoporosis   Relevant Orders   CBC with Differential/Platelet   CMP14+EGFR   Lipid panel     Other   Hyperlipidemia   Relevant Orders   CBC with Differential/Platelet  CMP14+EGFR   Lipid panel      Will continue Prolia, will have to come back for nurse visit a couple weeks for this.  Will check blood work, seems like she is doing well otherwise.  Also needs bone density scan after October Follow up plan: Return in about 6 months (around 10/28/2020), or if symptoms worsen or fail to improve, for Physical exam and recheck hypertension and cholesterol.  Counseling provided for all of the vaccine components No orders of the defined types were placed in this encounter.   [name], MD [facility] 04/28/2020, 1:09 PM

## 2020-04-29 LAB — CBC WITH DIFFERENTIAL/PLATELET
Basophils Absolute: 0.1 10*3/uL (ref 0.0–0.2)
Basos: 1 %
EOS (ABSOLUTE): 0.1 10*3/uL (ref 0.0–0.4)
Eos: 1 %
Hematocrit: 42.2 % (ref 34.0–46.6)
Hemoglobin: 14.3 g/dL (ref 11.1–15.9)
Immature Grans (Abs): 0 10*3/uL (ref 0.0–0.1)
Immature Granulocytes: 0 %
Lymphocytes Absolute: 3.2 10*3/uL — ABNORMAL HIGH (ref 0.7–3.1)
Lymphs: 33 %
MCH: 31.4 pg (ref 26.6–33.0)
MCHC: 33.9 g/dL (ref 31.5–35.7)
MCV: 93 fL (ref 79–97)
Monocytes Absolute: 0.8 10*3/uL (ref 0.1–0.9)
Monocytes: 8 %
Neutrophils Absolute: 5.7 10*3/uL (ref 1.4–7.0)
Neutrophils: 57 %
Platelets: 241 10*3/uL (ref 150–450)
RBC: 4.55 x10E6/uL (ref 3.77–5.28)
RDW: 12 % (ref 11.7–15.4)
WBC: 10 10*3/uL (ref 3.4–10.8)

## 2020-04-29 LAB — CMP14+EGFR
ALT: 18 IU/L (ref 0–32)
AST: 16 IU/L (ref 0–40)
Albumin/Globulin Ratio: 2 (ref 1.2–2.2)
Albumin: 4.2 g/dL (ref 3.5–4.6)
Alkaline Phosphatase: 35 IU/L — ABNORMAL LOW (ref 48–121)
BUN/Creatinine Ratio: 18 (ref 12–28)
BUN: 11 mg/dL (ref 10–36)
Bilirubin Total: <0.2 mg/dL (ref 0.0–1.2)
CO2: 24 mmol/L (ref 20–29)
Calcium: 9.6 mg/dL (ref 8.7–10.3)
Chloride: 101 mmol/L (ref 96–106)
Creatinine, Ser: 0.6 mg/dL (ref 0.57–1.00)
GFR calc Af Amer: 92 mL/min/{1.73_m2} (ref >59)
GFR calc non Af Amer: 80 mL/min/{1.73_m2} (ref >59)
Globulin, Total: 2.1 g/dL (ref 1.5–4.5)
Glucose: 104 mg/dL — ABNORMAL HIGH (ref 65–99)
Potassium: 4.3 mmol/L (ref 3.5–5.2)
Sodium: 139 mmol/L (ref 134–144)
Total Protein: 6.3 g/dL (ref 6.0–8.5)

## 2020-04-29 LAB — LIPID PANEL
Chol/HDL Ratio: 2.4 ratio (ref 0.0–4.4)
Cholesterol, Total: 193 mg/dL (ref 100–199)
HDL: 80 mg/dL (ref >39)
LDL Chol Calc (NIH): 98 mg/dL (ref 0–99)
Triglycerides: 82 mg/dL (ref 0–149)
VLDL Cholesterol Cal: 15 mg/dL (ref 5–40)

## 2020-04-30 ENCOUNTER — Encounter: Payer: Self-pay | Admitting: *Deleted

## 2020-04-30 ENCOUNTER — Other Ambulatory Visit: Payer: Self-pay | Admitting: *Deleted

## 2020-04-30 MED ORDER — MIRABEGRON ER 50 MG PO TB24: 50.0000 mg | ORAL_TABLET | Freq: Every day | ORAL | 0 refills | Status: DC

## 2020-04-30 NOTE — Progress Notes (Signed)
ERRONEOUS ENCOUNTER OPENED

## 2020-05-12 DIAGNOSIS — Z1231 Encounter for screening mammogram for malignant neoplasm of breast: Secondary | ICD-10-CM | POA: Diagnosis not present

## 2020-05-18 ENCOUNTER — Telehealth: Payer: Self-pay | Admitting: Family Medicine

## 2020-05-19 NOTE — Telephone Encounter (Signed)
Called Kristi Sawyer the drug rep for [employer] to ask about this. When I go to the [employer] site and search no results come back and I had sent for verification on 04/29/20 and placed a note in the binder for Kristi Sawyer to review when they were in the office but I don't see where they looked at it or left a note.    Pt is aware we are looking into it.

## 2020-05-19 NOTE — Telephone Encounter (Signed)
Can triage check on this?

## 2020-06-09 ENCOUNTER — Ambulatory Visit (INDEPENDENT_AMBULATORY_CARE_PROVIDER_SITE_OTHER): Payer: Medicare Other | Admitting: *Deleted

## 2020-06-09 DIAGNOSIS — M810 Age-related osteoporosis without current pathological fracture: Secondary | ICD-10-CM | POA: Diagnosis not present

## 2020-06-09 DIAGNOSIS — E782 Mixed hyperlipidemia: Secondary | ICD-10-CM

## 2020-06-09 DIAGNOSIS — I10 Essential (primary) hypertension: Secondary | ICD-10-CM

## 2020-06-09 NOTE — Patient Instructions (Signed)
Visit Information  Goals Addressed              This Visit's Progress     Patient Stated   .  "I need to get my Prolia Injection" (pt-stated)        CARE PLAN ENTRY (see longitudinal plan of care for additional care plan information)  Current Barriers:  . Care Coordination needs related to Prolia Injection in a patient with osteoporosis (disease states) . Problems with ordering medication  Nurse Case Manager Clinical Goal(s):  Marland Kitchen Over the next 30 days, patient will work with [facility] staff to obtain Prolia Injection  Interventions:  . Inter-disciplinary care team collaboration (see longitudinal plan of care) . Chart reviewed including recent office notes, telephone notes, and bone density scan results o Bone density scan due after 09/11/2020 . Collaborated with Pricilla Riffle, LPN regarding Prolia authorization and order . Advised patient that the order process has not been completed but that [name] will reach out to her with an update over the next few days . Collaborated with [facility] clinical staff to cancel patient's appt tomorrow for injection since medication has not been ordered/received . Encouraged patient to reach out to PCP office or CCM with any questions or concerns  Patient Self Care Activities:  . Performs ADL's independently . Performs IADL's independently  Initial goal documentation       Other   .  Chronic Disease Management Needs   On track     CARE PLAN ENTRY (see longtitudinal plan of care for additional care plan information)  Current Barriers:  . Chronic Disease Management support, education, and care coordination needs related to HTN, celiac disease, osteoporosis, HLD  Clinical Goal(s) related to HTN, celiac disease, osteoporosis, HLD:  Over the next 45 days, patient will:  . Work with the care management team to address educational, disease management, and care coordination needs  . Call provider office for new or worsened signs and  symptoms . Call care management team with questions or concerns  Interventions related to HTN, celiac disease, osteoporosis, HLD:  . Evaluation of current treatment plans and patient's adherence to plan as established by provider . Assessed patient understanding of disease states . Assessed patient's education and care coordination needs . Provided disease specific education to patient  . Collaborated with appropriate clinical care team members regarding patient needs o Dr Dettinger talked with patient about using Tylenol instead of Goody's Powder or Advil for pain management . Provided with CCM contact information and encouraged to reach out as needed . Discussed osteoporosis management o Prolia injections every 6 months o Collaborated with [facility] clinical staff regarding authorization/order o Dexa scan is up-to-date and due 08/2020  Patient Self Care Activities related to HTN, celiac disease, osteoporosis, HLD:  . Patient is able to independently perform ADLs and IADLs . Patient needs assistance with managing her chronic medical conditions  Please see past updates related to this goal by clicking on the "Past Updates" button in the selected goal         Patient verbalizes understanding of instructions provided today.   Follow-up Plan The care management team will reach out to the patient again over the next 60 days.   Chong Sicilian, BSN, RN-BC Embedded Chronic Care Manager [facility] / [facility] Direct Dial: [phone]

## 2020-06-09 NOTE — Chronic Care Management (AMB) (Signed)
Chronic Care Management   Follow Up Note   06/09/2020 Name: Kristi Sawyer MRN: [id number] DOB: 1926/08/14  Referred by: Dettinger, Fransisca Kaufmann, MD Reason for referral : Chronic Care Management (RN Follow up)   Kristi Sawyer is a 86 y.o. year old female who is a primary care patient of Dettinger, Fransisca Kaufmann, MD. The CCM team was consulted for assistance with chronic disease management and care coordination needs.    Review of patient status, including review of consultants reports, relevant laboratory and other test results, and collaboration with appropriate care team members and the patient's provider was performed as part of comprehensive patient evaluation and provision of chronic care management services.    SDOH (Social Determinants of Health) assessments performed: No See Care Plan activities for detailed interventions related to [facility])     Outpatient Encounter Medications as of 06/09/2020  Medication Sig Note   amLODipine (NORVASC) 5 MG tablet Take 1 tablet (5 mg total) by mouth daily.    aspirin (SB LOW DOSE ASA EC) 81 MG EC tablet Take 81 mg by mouth daily.      Aspirin-Acetaminophen-Caffeine 520-260-32.5 MG PACK Take by mouth. 04/27/2020: Takes PRN for mucle/joint pain. Alternates with ibuprofen   Calcium Carbonate-Vit D-Min (CALTRATE PLUS PO) Take 1 tablet by mouth daily.     Cholecalciferol (VITAMIN D) 2000 UNITS CAPS Take by mouth as directed. Take 1 tablet daily except none on saturdays and sundays    denosumab (PROLIA) 60 MG/ML SOSY injection Inject 60 mg into the skin every 6 (six) months.    Fe Fum-FePoly-Vit C-Vit B3 (INTEGRA) 62.5-62.5-40-3 MG CAPS Take 1 tablet by mouth daily.    hydrALAZINE (APRESOLINE) 10 MG tablet Take 1 tablet (10 mg total) by mouth every 8 (eight) hours.    ibuprofen (ADVIL) 200 MG tablet Take 200 mg by mouth every 6 (six) hours as needed. 04/27/2020: Takes PRN for muscle/joint pain. Alternates with Gabriel Earing Powder   Inulin (FIBER CHOICE FRUITY  BITES) 1.5 g CHEW Chew 5 tablets by mouth daily. FIBER CHOICE    metoCLOPramide (REGLAN) 10 MG tablet TAKE 1 TABLET BY MOUTH EVERYDAY AT BEDTIME    mirabegron ER (MYRBETRIQ) 50 MG TB24 tablet Take 1 tablet (50 mg total) by mouth daily.    Multiple Vitamins-Minerals (CENTRUM SILVER PO) Take 1 tablet by mouth daily.     Omega-3 Fatty Acids (FISH OIL) 1000 MG CAPS Take 1 capsule by mouth 3 (three) times daily.     omeprazole (PRILOSEC) 20 MG capsule TAKE 1-2 CAPSULES (20-40 MG TOTAL) BY MOUTH DAILY.    rosuvastatin (CRESTOR) 5 MG tablet TAKE 1 TABLET BY MOUTH ON MONDAY, WEDNESDAY, AND FRIDAY    No facility-administered encounter medications on file as of 06/09/2020.    RN Care Plan     "I need to get my Prolia Injection" (pt-stated)        CARE PLAN ENTRY (see longitudinal plan of care for additional care plan information)  Current Barriers:   Care Coordination needs related to Prolia Injection in a patient with osteoporosis (disease states)  Problems with ordering medication  Nurse Case Manager Clinical Goal(s):   Over the next 30 days, patient will work with [facility] Clinical staff to obtain Prolia Injection  Interventions:   Inter-disciplinary care team collaboration (see longitudinal plan of care)  Chart reviewed including recent office notes, telephone notes, and bone density scan results o Bone density scan due after 09/11/2020  Collaborated with Pricilla Riffle, LPN regarding Prolia authorization and  order  Advised patient that the order process has not been completed but that [name] will reach out to her with an update over the next few days  Collaborated with [facility] clinical staff to cancel patient's appt tomorrow for injection since medication has not been ordered/received  Encouraged patient to reach out to PCP office or CCM with any questions or concerns  Patient Self Care Activities:   Performs ADL's independently  Performs IADL's independently  Initial goal  documentation       Chronic Disease Management Needs   On track     [place] (see longtitudinal plan of care for additional care plan information)  Current Barriers:   Chronic Disease Management support, education, and care coordination needs related to HTN, celiac disease, osteoporosis, HLD  Clinical Goal(s) related to HTN, celiac disease, osteoporosis, HLD:  Over the next 45 days, patient will:   Work with the care management team to address educational, disease management, and care coordination needs   Call provider office for new or worsened signs and symptoms  Call care management team with questions or concerns  Interventions related to HTN, celiac disease, osteoporosis, HLD:   Evaluation of current treatment plans and patient's adherence to plan as established by provider  Assessed patient understanding of disease states  Assessed patient's education and care coordination needs  Provided disease specific education to patient   Collaborated with appropriate clinical care team members regarding patient needs o Dr Dettinger talked with patient about using Tylenol instead of Goody's Powder or Advil for pain management  Provided with CCM contact information and encouraged to reach out as needed  Discussed osteoporosis management o Prolia injections every 6 months o Collaborated with [facility] clinical staff regarding authorization/order o Dexa scan is up-to-date and due 08/2020  Patient Self Care Activities related to HTN, celiac disease, osteoporosis, HLD:   Patient is able to independently perform ADLs and IADLs  Patient needs assistance with managing her chronic medical conditions  Please see past updates related to this goal by clicking on the "Past Updates" button in the selected goal         Plan:   The care management team will reach out to the patient again over the next 60 days.    Chong Sicilian, BSN, RN-BC Embedded Chronic Care Manager [facility] [place] [facility] Direct Dial: [phone]

## 2020-06-10 ENCOUNTER — Ambulatory Visit: Payer: Medicare Other

## 2020-06-11 NOTE — Telephone Encounter (Signed)
After talking with Joycelyn Schmid from [facility] she suggested asking the pt if she had changed her supplemental insurance since last year? TTC pt to ask her and no answer.

## 2020-06-15 ENCOUNTER — Telehealth: Payer: Self-pay

## 2020-06-15 NOTE — Telephone Encounter (Signed)
Per Devra Dopp with Prolia patient probably has new insurance.  Need to verify with patient if she received a new MCR card. It may be different than the red, white and blue card that she is used to seeing.  If pt still has the red, white and blue card. Then the Prolia will be buy and bill. The cost to pt will be around $270.  Tried calling pt x2. She picked up the phone and said her name. Informed of who I was and she never responded. She kept hanging up the phone.

## 2020-06-16 ENCOUNTER — Telehealth: Payer: Self-pay | Admitting: Family Medicine

## 2020-06-16 NOTE — Telephone Encounter (Signed)
Per phone note from yesterday, patient insurance information was needed for Prolia.  Sent Wolfgang Phoenix a message and asked her to give the copies of insurance cards to [facility] for [employer].

## 2020-06-17 NOTE — Telephone Encounter (Signed)
Sent insurance info to [employer] portal today. May take up to 5 days for a response

## 2020-06-23 ENCOUNTER — Telehealth: Payer: Self-pay

## 2020-06-23 ENCOUNTER — Telehealth: Payer: Self-pay | Admitting: Family Medicine

## 2020-06-23 NOTE — Telephone Encounter (Signed)
Pt scheduled apt for 06/24/2020 at 2:30

## 2020-06-23 NOTE — Telephone Encounter (Signed)
Patient's Prolia is here. Left a message for pt informing and to return call to schedule an appointment with the nurse.

## 2020-06-24 ENCOUNTER — Other Ambulatory Visit: Payer: Self-pay

## 2020-06-24 ENCOUNTER — Ambulatory Visit (INDEPENDENT_AMBULATORY_CARE_PROVIDER_SITE_OTHER): Payer: Medicare Other | Admitting: *Deleted

## 2020-06-24 DIAGNOSIS — M810 Age-related osteoporosis without current pathological fracture: Secondary | ICD-10-CM | POA: Diagnosis not present

## 2020-06-24 MED ORDER — DENOSUMAB 60 MG/ML ~~LOC~~ SOSY
60.0000 mg | PREFILLED_SYRINGE | Freq: Once | SUBCUTANEOUS | Status: AC
  Administered 2020-06-24: 60 mg via SUBCUTANEOUS

## 2020-06-24 NOTE — Progress Notes (Signed)
Prolia Injection given and tolerated well

## 2020-06-24 NOTE — Patient Instructions (Signed)
Denosumab injection °What is this medicine? °DENOSUMAB (den oh sue mab) slows bone breakdown. Prolia is used to treat osteoporosis in women after menopause and in men, and in people who are taking corticosteroids for 6 months or more. Xgeva is used to treat a high calcium level due to cancer and to prevent bone fractures and other bone problems caused by multiple myeloma or cancer bone metastases. Xgeva is also used to treat giant cell tumor of the bone. °This medicine may be used for other purposes; ask your health care provider or pharmacist if you have questions. °COMMON BRAND NAME(S): Prolia, XGEVA °What should I tell my health care provider before I take this medicine? °They need to know if you have any of these conditions: °· dental disease °· having surgery or tooth extraction °· infection °· kidney disease °· low levels of calcium or Vitamin D in the blood °· malnutrition °· on hemodialysis °· skin conditions or sensitivity °· thyroid or parathyroid disease °· an unusual reaction to denosumab, other medicines, foods, dyes, or preservatives °· pregnant or trying to get pregnant °· breast-feeding °How should I use this medicine? °This medicine is for injection under the skin. It is given by a health care professional in a hospital or clinic setting. °A special MedGuide will be given to you before each treatment. Be sure to read this information carefully each time. °For Prolia, talk to your pediatrician regarding the use of this medicine in children. Special care may be needed. For Xgeva, talk to your pediatrician regarding the use of this medicine in children. While this drug may be prescribed for children as young as 13 years for selected conditions, precautions do apply. °Overdosage: If you think you have taken too much of this medicine contact a poison control center or emergency room at once. °NOTE: This medicine is only for you. Do not share this medicine with others. °What if I miss a dose? °It is  important not to miss your dose. Call your doctor or health care professional if you are unable to keep an appointment. °What may interact with this medicine? °Do not take this medicine with any of the following medications: °· other medicines containing denosumab °This medicine may also interact with the following medications: °· medicines that lower your chance of fighting infection °· steroid medicines like prednisone or cortisone °This list may not describe all possible interactions. Give your health care provider a list of all the medicines, herbs, non-prescription drugs, or dietary supplements you use. Also tell them if you smoke, drink alcohol, or use illegal drugs. Some items may interact with your medicine. °What should I watch for while using this medicine? °Visit your doctor or health care professional for regular checks on your progress. Your doctor or health care professional may order blood tests and other tests to see how you are doing. °Call your doctor or health care professional for advice if you get a fever, chills or sore throat, or other symptoms of a cold or flu. Do not treat yourself. This drug may decrease your body's ability to fight infection. Try to avoid being around people who are sick. °You should make sure you get enough calcium and vitamin D while you are taking this medicine, unless your doctor tells you not to. Discuss the foods you eat and the vitamins you take with your health care professional. °See your dentist regularly. Brush and floss your teeth as directed. Before you have any dental work done, tell your dentist you are   receiving this medicine. Do not become pregnant while taking this medicine or for 5 months after stopping it. Talk with your doctor or health care professional about your birth control options while taking this medicine. Women should inform their doctor if they wish to become pregnant or think they might be pregnant. There is a potential for serious side  effects to an unborn child. Talk to your health care professional or pharmacist for more information. What side effects may I notice from receiving this medicine? Side effects that you should report to your doctor or health care professional as soon as possible:  allergic reactions like skin rash, itching or hives, swelling of the face, lips, or tongue  bone pain  breathing problems  dizziness  jaw pain, especially after dental work  redness, blistering, peeling of the skin  signs and symptoms of infection like fever or chills; cough; sore throat; pain or trouble passing urine  signs of low calcium like fast heartbeat, muscle cramps or muscle pain; pain, tingling, numbness in the hands or feet; seizures  unusual bleeding or bruising  unusually weak or tired Side effects that usually do not require medical attention (report to your doctor or health care professional if they continue or are bothersome):  constipation  diarrhea  headache  joint pain  loss of appetite  muscle pain  runny nose  tiredness  upset stomach This list may not describe all possible side effects. Call your doctor for medical advice about side effects. You may report side effects to FDA at 1-800-FDA-1088. Where should I keep my medicine? This medicine is only given in a clinic, doctor's office, or other health care setting and will not be stored at home. NOTE: This sheet is a summary. It may not cover all possible information. If you have questions about this medicine, talk to your doctor, pharmacist, or health care provider.  2020 Elsevier/Gold Standard (2018-03-08 16:10:44)

## 2020-07-16 ENCOUNTER — Telehealth: Payer: Self-pay | Admitting: Internal Medicine

## 2020-07-16 ENCOUNTER — Other Ambulatory Visit: Payer: Self-pay | Admitting: Internal Medicine

## 2020-07-16 NOTE — Telephone Encounter (Signed)
I spoke with Kristi Sawyer and made her an appointment for 09/14/2020 at 11:10AM. I confirmed the pharmacy and sent in her reglan. She said thank you.

## 2020-07-16 NOTE — Telephone Encounter (Signed)
Patient requesting refill on Reglan medication before the weekend

## 2020-07-16 NOTE — Telephone Encounter (Signed)
May I refill Sir, thank you.

## 2020-07-16 NOTE — Telephone Encounter (Signed)
3 month refill ok She should come see me

## 2020-07-16 NOTE — Telephone Encounter (Signed)
Rx approved and refilled , patient aware.

## 2020-07-24 ENCOUNTER — Other Ambulatory Visit: Payer: Self-pay | Admitting: Internal Medicine

## 2020-07-28 ENCOUNTER — Telehealth: Payer: Self-pay | Admitting: Family Medicine

## 2020-07-28 NOTE — Telephone Encounter (Signed)
Samples up front patient aware.

## 2020-07-28 NOTE — Telephone Encounter (Signed)
Pt says that she [place] [facility] and needs help with mirabegron ER (MYRBETRIQ) 50 MG TB24 tablet. Please call back

## 2020-08-09 ENCOUNTER — Other Ambulatory Visit: Payer: Self-pay | Admitting: Internal Medicine

## 2020-08-18 ENCOUNTER — Ambulatory Visit (INDEPENDENT_AMBULATORY_CARE_PROVIDER_SITE_OTHER): Payer: Medicare Other | Admitting: *Deleted

## 2020-08-18 ENCOUNTER — Other Ambulatory Visit: Payer: Self-pay

## 2020-08-18 DIAGNOSIS — Z23 Encounter for immunization: Secondary | ICD-10-CM | POA: Diagnosis not present

## 2020-09-14 ENCOUNTER — Encounter: Payer: Self-pay | Admitting: Internal Medicine

## 2020-09-14 ENCOUNTER — Ambulatory Visit (INDEPENDENT_AMBULATORY_CARE_PROVIDER_SITE_OTHER): Payer: Medicare Other | Admitting: Internal Medicine

## 2020-09-14 VITALS — BP 152/66 | HR 80 | Ht 60.0 in | Wt 93.4 lb

## 2020-09-14 DIAGNOSIS — K219 Gastro-esophageal reflux disease without esophagitis: Secondary | ICD-10-CM | POA: Diagnosis not present

## 2020-09-14 DIAGNOSIS — K900 Celiac disease: Secondary | ICD-10-CM

## 2020-09-14 MED ORDER — METOCLOPRAMIDE HCL 10 MG PO TABS: 10.0000 mg | ORAL_TABLET | Freq: Every day | ORAL | 3 refills | Status: DC

## 2020-09-14 NOTE — Patient Instructions (Signed)
Glad you are doing well and hope it stays that way for as long as possible!  I refilled the metaclopramide for a year and when you call they can refill.  I appreciate the opportunity to care for you. Gatha Mayer, MD, [facility]

## 2020-09-14 NOTE — Progress Notes (Signed)
Kristi Sawyer 86 y.o. [birth year] [id number]  Assessment & Plan:   Encounter Diagnoses  Name Primary?  . Gastroesophageal reflux disease, unspecified whether esophagitis present Yes  . CELIAC SPRUE     Continue current care.  Return in 1 year sooner as needed.  Subjective:   Chief Complaint: Follow-up of reflux on PPI and metoclopramide  HPI Kristi Sawyer has a history of reflux, she is on PPI 1-2 times a day and nocturnal metoclopramide for many years without problems.  She also has celiac disease and remains gluten-free.  She is here for recheck on metoclopramide looking for toxicity.  She does not complain of any neuropathic pains. Allergies  Allergen Reactions  . Prevnar 13 [Pneumococcal 13-Val Conj Vacc] Hives    Red/streaking arm  . Penicillins Hives    Has patient had a PCN reaction causing immediate rash, facial/tongue/throat swelling, SOB or lightheadedness with hypotension: No Has patient had a PCN reaction causing severe rash involving mucus membranes or skin necrosis: No Has patient had a PCN reaction that required hospitalization: No Has patient had a PCN reaction occurring within the last 10 years: No If all of the above answers are "NO", then may proceed with Cephalosporin use.   . Tetanus Toxoids Other (See Comments)    Area of injection was red and streaked.  . Codeine Nausea Only  . Sulfonamide Derivatives Nausea Only   Current Meds  Medication Sig  . amLODipine (NORVASC) 5 MG tablet Take 1 tablet (5 mg total) by mouth daily.  Marland Kitchen aspirin (SB LOW DOSE ASA EC) 81 MG EC tablet Take 81 mg by mouth daily.    . Aspirin-Acetaminophen-Caffeine 520-260-32.5 MG PACK Take by mouth.  . Calcium Carbonate-Vit D-Min (CALTRATE PLUS PO) Take 1 tablet by mouth daily.   . Cholecalciferol (VITAMIN D) 2000 UNITS CAPS Take by mouth as directed. Take 1 tablet daily except none on saturdays and sundays  . denosumab (PROLIA) 60 MG/ML SOSY injection Inject 60 mg into the skin every 6  (six) months.  . Fe Fum-FePoly-Vit C-Vit B3 (INTEGRA) 62.5-62.5-40-3 MG CAPS TAKE 1 CAPSULE BY MOUTH EVERY DAY  . hydrALAZINE (APRESOLINE) 10 MG tablet Take 1 tablet (10 mg total) by mouth every 8 (eight) hours.  Marland Kitchen ibuprofen (ADVIL) 200 MG tablet Take 200 mg by mouth every 6 (six) hours as needed.  . Inulin (FIBER CHOICE FRUITY BITES) 1.5 g CHEW Chew 5 tablets by mouth daily. FIBER CHOICE  . metoCLOPramide (REGLAN) 10 MG tablet TAKE 1 TABLET BY MOUTH EVERYDAY AT BEDTIME  . mirabegron ER (MYRBETRIQ) 50 MG TB24 tablet Take 1 tablet (50 mg total) by mouth daily.  . Multiple Vitamins-Minerals (CENTRUM SILVER PO) Take 1 tablet by mouth daily.   . Omega-3 Fatty Acids (FISH OIL) 1000 MG CAPS Take 1 capsule by mouth 3 (three) times daily.   Marland Kitchen omeprazole (PRILOSEC) 20 MG capsule TAKE 1-2 CAPSULES (20-40 MG TOTAL) BY MOUTH DAILY.  . rosuvastatin (CRESTOR) 5 MG tablet TAKE 1 TABLET BY MOUTH ON MONDAY, WEDNESDAY, AND FRIDAY   Past Medical History:  Diagnosis Date  . Acid reflux   . Anemia   . Anxiety   . Cataracts, bilateral   . Celiac disease/sprue   . Chronic gastritis   . Esophageal stricture   . Euthyroid   . Gastritis   . Hiatal hernia   . Hyperlipidemia   . Hypermobility syndrome   . 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 Neuropathy   . Osteoporosis   .  Palpitations   . Postmenopausal HRT (hormone replacement therapy)   . Shingles    x 3  . Thyroid tumor, benign    Past Surgical History:  Procedure Laterality Date  . ABDOMINAL HYSTERECTOMY    . APPENDECTOMY  1935  . CATARACT EXTRACTION Bilateral   . COLONOSCOPY    . ESOPHAGOGASTRODUODENOSCOPY    . EYE SURGERY    . MYOMECTOMY    . PARTIAL HYSTERECTOMY  1974   w 1/2 right ovary   . RETINAL LASER PROCEDURE    . SIGMOIDOSCOPY    . THYROIDECTOMY  1975   rt. 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   Review of Systems As above will be 89 in December still drives though not in [place]  Objective:   Physical Exam BP (!) 152/66   Pulse 80   Ht 5' (1.524 m)   Wt 93 lb 6.4 oz (42.4 kg)   BMI 18.24 kg/m  Petite elderly white woman in no acute distress There are no signs of tardive dyskinesia

## 2020-10-08 ENCOUNTER — Other Ambulatory Visit: Payer: Self-pay | Admitting: Family Medicine

## 2020-10-20 ENCOUNTER — Ambulatory Visit (INDEPENDENT_AMBULATORY_CARE_PROVIDER_SITE_OTHER): Payer: Medicare Other

## 2020-10-20 ENCOUNTER — Encounter: Payer: Self-pay | Admitting: Family Medicine

## 2020-10-20 ENCOUNTER — Ambulatory Visit (INDEPENDENT_AMBULATORY_CARE_PROVIDER_SITE_OTHER): Payer: Medicare Other | Admitting: Family Medicine

## 2020-10-20 ENCOUNTER — Other Ambulatory Visit: Payer: Self-pay

## 2020-10-20 VITALS — BP 137/69 | HR 75 | Ht 60.0 in | Wt 92.5 lb

## 2020-10-20 DIAGNOSIS — E782 Mixed hyperlipidemia: Secondary | ICD-10-CM | POA: Diagnosis not present

## 2020-10-20 DIAGNOSIS — M4726 Other spondylosis with radiculopathy, lumbar region: Secondary | ICD-10-CM | POA: Diagnosis not present

## 2020-10-20 DIAGNOSIS — K219 Gastro-esophageal reflux disease without esophagitis: Secondary | ICD-10-CM | POA: Diagnosis not present

## 2020-10-20 DIAGNOSIS — I10 Essential (primary) hypertension: Secondary | ICD-10-CM | POA: Diagnosis not present

## 2020-10-20 DIAGNOSIS — Z780 Asymptomatic menopausal state: Secondary | ICD-10-CM

## 2020-10-20 MED ORDER — AMLODIPINE BESYLATE 5 MG PO TABS
5.0000 mg | ORAL_TABLET | Freq: Every day | ORAL | 3 refills | Status: DC
Start: 2020-10-20 — End: 2021-10-17

## 2020-10-20 MED ORDER — OMEPRAZOLE 20 MG PO CPDR
20.0000 mg | DELAYED_RELEASE_CAPSULE | Freq: Every day | ORAL | 3 refills | Status: DC
Start: 2020-10-20 — End: 2021-08-02

## 2020-10-20 MED ORDER — HYDRALAZINE HCL 10 MG PO TABS
10.0000 mg | ORAL_TABLET | Freq: Three times a day (TID) | ORAL | 3 refills | Status: DC
Start: 2020-10-20 — End: 2021-10-17

## 2020-10-20 MED ORDER — ROSUVASTATIN CALCIUM 5 MG PO TABS
5.0000 mg | ORAL_TABLET | Freq: Every day | ORAL | 3 refills | Status: DC
Start: 2020-10-20 — End: 2020-12-07

## 2020-10-20 MED ORDER — MIRABEGRON ER 50 MG PO TB24
50.0000 mg | ORAL_TABLET | Freq: Every day | ORAL | 3 refills | Status: DC
Start: 2020-10-20 — End: 2021-10-17

## 2020-10-20 NOTE — Progress Notes (Signed)
BP 137/69   Pulse 75   Ht 5' (1.524 m)   Wt 92 lb 8 oz (42 kg)   SpO2 99%   BMI 18.07 kg/m    Subjective:   Patient ID: Kristi Sawyer, female    DOB: 22-Feb-1926, 86 y.o.   MRN: [id number]  HPI: Kristi Sawyer is a 86 y.o. female presenting on 10/20/2020 for Medical Management of Chronic Issues and Hypertension   HPI Hypertension Patient is currently on amlodipine and hydralazine, and their blood pressure today is 137/69. Patient denies any lightheadedness or dizziness. Patient denies headaches, blurred vision, chest pains, shortness of breath, or weakness. Denies any side effects from medication and is content with current medication.   Hyperlipidemia Patient is coming in for recheck of his hyperlipidemia. The patient is currently taking Crestor. They deny any issues with myalgias or history of liver damage from it. They deny any focal numbness or weakness or chest pain.   GERD Patient is currently on omeprazole.  She denies any major symptoms or abdominal pain or belching or burping. She denies any blood in her stool or lightheadedness or dizziness.   Relevant past medical, surgical, family and social history reviewed and updated as indicated. Interim medical history since our last visit reviewed. Allergies and medications reviewed and updated.  Review of Systems  Constitutional: Negative for chills and fever.  HENT: Negative for congestion, ear discharge and ear pain.   Eyes: Negative for redness and visual disturbance.  Respiratory: Negative for chest tightness and shortness of breath.   Cardiovascular: Negative for chest pain and leg swelling.  Genitourinary: Negative for difficulty urinating and dysuria.  Musculoskeletal: Negative for back pain and gait problem.  Skin: Negative for rash.  Neurological: Negative for dizziness, light-headedness and headaches.  Psychiatric/Behavioral: Negative for agitation and behavioral problems.  All other systems reviewed and are  negative.   Per HPI unless specifically indicated above   Allergies as of 10/20/2020      Reactions   Prevnar 13 [pneumococcal 13-val Conj Vacc] Hives   Red/streaking arm   Penicillins Hives   Has patient had a PCN reaction causing immediate rash, facial/tongue/throat swelling, SOB or lightheadedness with hypotension: No Has patient had a PCN reaction causing severe rash involving mucus membranes or skin necrosis: No Has patient had a PCN reaction that required hospitalization: No Has patient had a PCN reaction occurring within the last 10 years: No If all of the above answers are "NO", then may proceed with Cephalosporin use.   Tetanus Toxoids Other (See Comments)   Area of injection was red and streaked.   Codeine Nausea Only   Sulfonamide Derivatives Nausea Only      Medication List       Accurate as of October 20, 2020 11:44 AM. If you have any questions, ask your nurse or doctor.        amLODipine 5 MG tablet Commonly known as: NORVASC Take 1 tablet (5 mg total) by mouth daily.   Aspirin-Acetaminophen-Caffeine 520-260-32.5 MG Pack Take by mouth.   CALTRATE PLUS PO Take 1 tablet by mouth daily.   CENTRUM SILVER PO Take 1 tablet by mouth daily.   denosumab 60 MG/ML Sosy injection Commonly known as: PROLIA Inject 60 mg into the skin every 6 (six) months.   Fiber Choice Fruity Bites 1.5 g Chew Generic drug: Inulin Chew 5 tablets by mouth daily. FIBER CHOICE   Fish Oil 1000 MG Caps Take 1 capsule by mouth 3 (three) times  daily.   hydrALAZINE 10 MG tablet Commonly known as: APRESOLINE Take 1 tablet (10 mg total) by mouth every 8 (eight) hours.   ibuprofen 200 MG tablet Commonly known as: ADVIL Take 200 mg by mouth every 6 (six) hours as needed.   Integra 62.5-62.5-40-3 MG Caps TAKE 1 CAPSULE BY MOUTH EVERY DAY   metoCLOPramide 10 MG tablet Commonly known as: REGLAN Take 1 tablet (10 mg total) by mouth at bedtime.   mirabegron ER 50 MG Tb24  tablet Commonly known as: Myrbetriq Take 1 tablet (50 mg total) by mouth daily.   omeprazole 20 MG capsule Commonly known as: PRILOSEC Take 1-2 capsules (20-40 mg total) by mouth daily.   rosuvastatin 5 MG tablet Commonly known as: CRESTOR Take 1 tablet (5 mg total) by mouth daily. What changed: See the new instructions. Changed by: Fransisca Kaufmann Kiam Bransfield, MD   SB Low Dose ASA EC 81 MG EC tablet Generic drug: aspirin Take 81 mg by mouth daily.   Vitamin D 50 MCG (2000 UT) Caps Take by mouth as directed. Take 1 tablet daily except none on saturdays and sundays        Objective:   BP 137/69   Pulse 75   Ht 5' (1.524 m)   Wt 92 lb 8 oz (42 kg)   SpO2 99%   BMI 18.07 kg/m   Wt Readings from Last 3 Encounters:  10/20/20 92 lb 8 oz (42 kg)  09/14/20 93 lb 6.4 oz (42.4 kg)  04/28/20 98 lb 12.8 oz (44.8 kg)    Physical Exam Vitals and nursing note reviewed.  Constitutional:      General: She is not in acute distress.    Appearance: She is well-developed. She is not diaphoretic.  Eyes:     Conjunctiva/sclera: Conjunctivae normal.  Cardiovascular:     Rate and Rhythm: Normal rate and regular rhythm.     Heart sounds: Normal heart sounds. No murmur heard.   Pulmonary:     Effort: Pulmonary effort is normal. No respiratory distress.     Breath sounds: Normal breath sounds. No wheezing.  Musculoskeletal:        General: No tenderness. Normal range of motion.  Skin:    General: Skin is warm and dry.     Findings: No rash.  Neurological:     Mental Status: She is alert and oriented to person, place, and time.     Coordination: Coordination normal.  Psychiatric:        Behavior: Behavior normal.       Assessment & Plan:   Problem List Items Addressed This Visit      Cardiovascular and Mediastinum   Hypertension - Primary   Relevant Medications   rosuvastatin (CRESTOR) 5 MG tablet   hydrALAZINE (APRESOLINE) 10 MG tablet   amLODipine (NORVASC) 5 MG tablet    Other Relevant Orders   CMP14+EGFR     Digestive   GERD (gastroesophageal reflux disease)   Relevant Medications   omeprazole (PRILOSEC) 20 MG capsule   Other Relevant Orders   CBC with Differential/Platelet     Other   Hyperlipidemia   Relevant Medications   rosuvastatin (CRESTOR) 5 MG tablet   hydrALAZINE (APRESOLINE) 10 MG tablet   amLODipine (NORVASC) 5 MG tablet   Other Relevant Orders   CMP14+EGFR   Lipid panel    Other Visit Diagnoses    Postmenopausal       Relevant Orders   DG [facility] DEXA   Osteoarthritis of  spine with radiculopathy, lumbar region       Relevant Orders   DG [facility] DEXA      Continue current medication, no changes.  We will do bone density scan and blood work today.  He feels like things are going very well and no changes today. Follow up plan: Return in about 6 months (around 04/20/2021), or if symptoms worsen or fail to improve, for Hypertension and hyperlipidemia and GERD, also needs annual wellness.  Counseling provided for all of the vaccine components Orders Placed This Encounter  Procedures  . DG [facility] DEXA  . CBC with Differential/Platelet  . CMP14+EGFR  . Lipid panel    [name], MD [facility] 10/20/2020, 11:44 AM

## 2020-10-21 DIAGNOSIS — Z780 Asymptomatic menopausal state: Secondary | ICD-10-CM | POA: Diagnosis not present

## 2020-10-21 DIAGNOSIS — M810 Age-related osteoporosis without current pathological fracture: Secondary | ICD-10-CM | POA: Diagnosis not present

## 2020-10-21 LAB — CMP14+EGFR
ALT: 16 IU/L (ref 0–32)
AST: 19 IU/L (ref 0–40)
Albumin/Globulin Ratio: 2.8 — ABNORMAL HIGH (ref 1.2–2.2)
Albumin: 4.8 g/dL — ABNORMAL HIGH (ref 3.5–4.6)
Alkaline Phosphatase: 32 IU/L — ABNORMAL LOW (ref 44–121)
BUN/Creatinine Ratio: 20 (ref 12–28)
BUN: 12 mg/dL (ref 10–36)
Bilirubin Total: 0.2 mg/dL (ref 0.0–1.2)
CO2: 27 mmol/L (ref 20–29)
Calcium: 9.9 mg/dL (ref 8.7–10.3)
Chloride: 99 mmol/L (ref 96–106)
Creatinine, Ser: 0.59 mg/dL (ref 0.57–1.00)
GFR calc Af Amer: 93 mL/min/{1.73_m2} (ref >59)
GFR calc non Af Amer: 80 mL/min/{1.73_m2} (ref >59)
Globulin, Total: 1.7 g/dL (ref 1.5–4.5)
Glucose: 91 mg/dL (ref 65–99)
Potassium: 4.7 mmol/L (ref 3.5–5.2)
Sodium: 139 mmol/L (ref 134–144)
Total Protein: 6.5 g/dL (ref 6.0–8.5)

## 2020-10-21 LAB — CBC WITH DIFFERENTIAL/PLATELET
Basophils Absolute: 0.1 10*3/uL (ref 0.0–0.2)
Basos: 1 %
EOS (ABSOLUTE): 0.1 10*3/uL (ref 0.0–0.4)
Eos: 1 %
Hematocrit: 43.3 % (ref 34.0–46.6)
Hemoglobin: 14.2 g/dL (ref 11.1–15.9)
Immature Grans (Abs): 0 10*3/uL (ref 0.0–0.1)
Immature Granulocytes: 0 %
Lymphocytes Absolute: 2.4 10*3/uL (ref 0.7–3.1)
Lymphs: 33 %
MCH: 30.1 pg (ref 26.6–33.0)
MCHC: 32.8 g/dL (ref 31.5–35.7)
MCV: 92 fL (ref 79–97)
Monocytes Absolute: 0.8 10*3/uL (ref 0.1–0.9)
Monocytes: 12 %
Neutrophils Absolute: 3.8 10*3/uL (ref 1.4–7.0)
Neutrophils: 53 %
Platelets: 241 10*3/uL (ref 150–450)
RBC: 4.71 x10E6/uL (ref 3.77–5.28)
RDW: 11.8 % (ref 11.7–15.4)
WBC: 7.2 10*3/uL (ref 3.4–10.8)

## 2020-10-21 LAB — LIPID PANEL
Chol/HDL Ratio: 2.1 ratio (ref 0.0–4.4)
Cholesterol, Total: 186 mg/dL (ref 100–199)
HDL: 89 mg/dL (ref >39)
LDL Chol Calc (NIH): 87 mg/dL (ref 0–99)
Triglycerides: 48 mg/dL (ref 0–149)
VLDL Cholesterol Cal: 10 mg/dL (ref 5–40)

## 2020-10-27 ENCOUNTER — Telehealth: Payer: Self-pay

## 2020-10-27 NOTE — Telephone Encounter (Signed)
Can you review labs and dexa and we will contact patient?

## 2020-10-27 NOTE — Telephone Encounter (Signed)
Please call patient with lab results and bone density results.

## 2020-11-03 ENCOUNTER — Other Ambulatory Visit: Payer: Self-pay | Admitting: Family Medicine

## 2020-11-19 ENCOUNTER — Ambulatory Visit: Payer: Medicare Other | Admitting: Pharmacist

## 2020-11-19 DIAGNOSIS — N3281 Overactive bladder: Secondary | ICD-10-CM | POA: Diagnosis not present

## 2020-11-22 ENCOUNTER — Ambulatory Visit: Payer: Medicare Other | Admitting: Pharmacist

## 2020-11-23 ENCOUNTER — Ambulatory Visit (INDEPENDENT_AMBULATORY_CARE_PROVIDER_SITE_OTHER): Payer: Medicare Other | Admitting: Pharmacist

## 2020-11-23 ENCOUNTER — Telehealth: Payer: Self-pay | Admitting: Pharmacist

## 2020-11-23 DIAGNOSIS — M810 Age-related osteoporosis without current pathological fracture: Secondary | ICD-10-CM

## 2020-11-23 NOTE — Telephone Encounter (Signed)
Benefit verification initiated through [employer] portal
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
## 2020-11-23 NOTE — Telephone Encounter (Signed)
Patient due for Prolia in February  Had ~28% increase in bone density in the spine since use Patient would like to be called when it is here  Thank you!

## 2020-12-01 ENCOUNTER — Encounter (INDEPENDENT_AMBULATORY_CARE_PROVIDER_SITE_OTHER): Payer: Medicare Other | Admitting: Ophthalmology

## 2020-12-02 ENCOUNTER — Encounter (INDEPENDENT_AMBULATORY_CARE_PROVIDER_SITE_OTHER): Payer: Medicare Other | Admitting: Ophthalmology

## 2020-12-06 ENCOUNTER — Other Ambulatory Visit: Payer: Self-pay | Admitting: Family Medicine

## 2020-12-07 ENCOUNTER — Ambulatory Visit (INDEPENDENT_AMBULATORY_CARE_PROVIDER_SITE_OTHER): Payer: Medicare Other

## 2020-12-07 DIAGNOSIS — Z0000 Encounter for general adult medical examination without abnormal findings: Secondary | ICD-10-CM

## 2020-12-07 NOTE — Patient Instructions (Signed)
  [facility] Maintenance Summary and Written Plan of Care  [name] ,  Thank you for allowing me to perform your Medicare Annual Wellness Visit and for your ongoing commitment to your health.   Health Maintenance & Immunization History Health Maintenance  Topic Date Due  . COVID-19 Vaccine (3 - Booster for Pfizer series) 06/24/2020  . MAMMOGRAM  05/12/2021  . DEXA SCAN  10/21/2022  . TETANUS/TDAP  06/04/2023  . INFLUENZA VACCINE  Completed  . PNA vac Low Risk Adult  Completed   Immunization History  Administered Date(s) Administered  . Fluad Quad(high Dose 65+) 08/18/2020  . Influenza, High Dose Seasonal PF 08/07/2017, 09/11/2018  . Influenza,inj,Quad PF,6+ Mos 08/04/2013, 08/18/2014, 09/16/2015, 08/22/2016  . Influenza-Unspecified 08/11/2009, 08/04/2010, 09/13/2011, 09/16/2015  . PFIZER(Purple Top)SARS-COV-2 Vaccination 12/05/2019, 12/26/2019  . Pneumococcal Conjugate-13 11/05/2013  . Pneumococcal Polysaccharide-23 11/14/1995  . Zoster 12/27/2010    These are the patient goals that we discussed: Goals Addressed              This Visit's Progress     Patient Stated   .  "I need to get my Prolia Injection" (pt-stated)   On track     [place] (see longitudinal plan of care for additional care plan information)  Current Barriers:  . Care Coordination needs related to Prolia Injection in a patient with osteoporosis (disease states) . Problems with ordering medication  Nurse Case Manager Clinical Goal(s):  Marland Kitchen Over the next 30 days, patient will work with [facility] staff to obtain Prolia Injection  Interventions:  . Inter-disciplinary care team collaboration (see longitudinal plan of care) . Chart reviewed including recent office notes, telephone notes, and bone density scan results o Bone density scan due after 09/11/2020 . Collaborated with Pricilla Riffle, LPN regarding Prolia authorization and order . Advised patient that the order  process has not been completed but that [name] will reach out to her with an update over the next few days . Collaborated with [facility] clinical staff to cancel patient's appt tomorrow for injection since medication has not been ordered/received . Encouraged patient to reach out to PCP office or CCM with any questions or concerns  Patient Self Care Activities:  . Performs ADL's independently . Performs IADL's independently  Initial goal documentation       Other   .  Exercise 150 minutes per week (moderate activity)   On track     [place] job - keep up daily walking        This is a list of Health Maintenance Items that are overdue or due now: Health Maintenance Due  Topic Date Due  . COVID-19 Vaccine (3 - Booster for Pfizer series) 06/24/2020     Orders/Referrals Placed Today: No orders of the defined types were placed in this encounter.  (Contact our referral department at [phone] if you have not spoken with someone about your referral appointment within the next 5 days)    Follow-up Plan  Scheduled with Dr. Warrick Parisian on 04/20/21 at 10:55am

## 2020-12-07 NOTE — Progress Notes (Signed)
MEDICARE ANNUAL WELLNESS VISIT  12/07/2020  Telephone Visit Disclaimer This Medicare AWV was conducted by telephone due to national recommendations for restrictions regarding the COVID-19 Pandemic (e.g. social distancing).  I verified, using two identifiers, that I am speaking with Kristi Sawyer or their authorized healthcare agent. I discussed the limitations, risks, security, and privacy concerns of performing an evaluation and management service by telephone and the potential availability of an in-person appointment in the future. The patient expressed understanding and agreed to proceed.  Location of Patient: Home  Location of Provider (nurse):  [facility]  Subjective:    Kristi Sawyer is a 87 y.o. female patient of Dettinger, Fransisca Kaufmann, MD who had a Medicare Annual Wellness Visit today via telephone. Kristi Sawyer is Retired and lives alone. she has no children. she reports that she is socially active and does interact with friends/family regularly. she is minimally physically active and enjoys reading and watching the [employer].  Patient Care Team: Dettinger, Fransisca Kaufmann, MD as PCP - General (Family Medicine) Gatha Mayer, MD as Consulting Physician (Gastroenterology) Zadie Rhine Clent Demark, MD as Consulting Physician (Ophthalmology) Ilean China, RN as Registered Nurse  Advanced Directives 12/07/2020 05/05/2019 05/01/2018 02/01/2018 10/27/2017 10/26/2017 04/16/2017  Does Patient Have a Medical Advance Directive? Yes No No No Yes Yes Yes  Type of Paramedic of [place];Living will - - - Out of facility DNR (pink MOST or yellow form);Living will;Healthcare Power of [place];Living will [place];Living will  Does patient want to make changes to medical advance directive? No - Patient declined - - - No - Patient declined - No - Patient declined  Copy of [place] in Chart? - - - -  No - copy requested - Yes  Would patient like information on creating a medical advance directive? - No - Patient declined No - Patient declined No - Patient declined - - -    Hospital Utilization Over the Past 12 Months: # of hospitalizations or ER visits: 0 # of surgeries: 0  Review of Systems    Patient reports that her overall health is unchanged compared to last year.  Patient Reported Readings (BP, Pulse, CBG, Weight, etc) none  Pain Assessment Pain : 0-10 Pain Score: 3  Pain Type: Chronic pain Pain Location: Back Pain Radiating Towards: right hip Pain Descriptors / Indicators: Discomfort Pain Onset: More than a month ago Pain Frequency: Intermittent Pain Relieving Factors: Ibuprofen  Pain Relieving Factors: Ibuprofen  Current Medications & Allergies (verified) Allergies as of 12/07/2020      Reactions   Prevnar 13 [pneumococcal 13-val Conj Vacc] Hives   Red/streaking arm   Penicillins Hives   Has patient had a PCN reaction causing immediate rash, facial/tongue/throat swelling, SOB or lightheadedness with hypotension: No Has patient had a PCN reaction causing severe rash involving mucus membranes or skin necrosis: No Has patient had a PCN reaction that required hospitalization: No Has patient had a PCN reaction occurring within the last 10 years: No If all of the above answers are "NO", then may proceed with Cephalosporin use.   Tetanus Toxoids Other (See Comments)   Area of injection was red and streaked.   Codeine Nausea Only   Sulfonamide Derivatives Nausea Only      Medication List       Accurate as of December 07, 2020 11:02 AM. If you have any questions, ask your nurse or doctor.  amLODipine 5 MG tablet Commonly known as: NORVASC Take 1 tablet (5 mg total) by mouth daily.   aspirin 81 MG EC tablet Take 81 mg by mouth daily.   Aspirin-Acetaminophen-Caffeine 520-260-32.5 MG Pack Take by mouth.   CALTRATE PLUS PO Take 1 tablet by mouth  daily.   CENTRUM SILVER PO Take 1 tablet by mouth daily.   denosumab 60 MG/ML Sosy injection Commonly known as: PROLIA Inject 60 mg into the skin every 6 (six) months.   Fish Oil 1000 MG Caps Take 1 capsule by mouth 3 (three) times daily.   hydrALAZINE 10 MG tablet Commonly known as: APRESOLINE Take 1 tablet (10 mg total) by mouth every 8 (eight) hours.   ibuprofen 200 MG tablet Commonly known as: ADVIL Take 200 mg by mouth every 6 (six) hours as needed.   Integra 62.5-62.5-40-3 MG Caps TAKE 1 CAPSULE BY MOUTH EVERY DAY   Inulin 1.5 g Chew Chew 5 tablets by mouth daily. FIBER CHOICE   metoCLOPramide 10 MG tablet Commonly known as: REGLAN Take 1 tablet (10 mg total) by mouth at bedtime.   mirabegron ER 50 MG Tb24 tablet Commonly known as: Myrbetriq Take 1 tablet (50 mg total) by mouth daily.   omeprazole 20 MG capsule Commonly known as: PRILOSEC Take 1-2 capsules (20-40 mg total) by mouth daily.   rosuvastatin 5 MG tablet Commonly known as: CRESTOR TAKE 1 TABLET BY MOUTH ON MONDAY, WEDNESDAY, AND FRIDAY   Vitamin D 50 MCG (2000 UT) Caps Take by mouth as directed. Take 1 tablet daily except none on saturdays and sundays       History (reviewed): Past Medical History:  Diagnosis Date  . Acid reflux   . Anemia   . Anxiety   . Cataracts, bilateral   . Celiac disease/sprue   . Chronic gastritis   . Esophageal stricture   . Euthyroid   . Gastritis   . Hiatal hernia   . Hyperlipidemia   . Hypermobility syndrome   . 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 Neuropathy   . Osteoporosis   . Palpitations   . Postmenopausal HRT (hormone replacement therapy)   . Shingles    x 3  . Thyroid tumor, benign    Past Surgical History:  Procedure Laterality Date  . ABDOMINAL HYSTERECTOMY    . APPENDECTOMY  1935  . CATARACT EXTRACTION Bilateral   . COLONOSCOPY    . ESOPHAGOGASTRODUODENOSCOPY    . EYE SURGERY    . MYOMECTOMY    . PARTIAL HYSTERECTOMY   1974   w 1/2 right ovary   . RETINAL LASER PROCEDURE    . SIGMOIDOSCOPY    . THYROIDECTOMY  1975   rt. benign tumor    Family History  Problem Relation Age of Onset  . Healthy Mother   . Lung cancer Father   . Colon cancer Neg Hx   . Stomach cancer Neg Hx   . Esophageal cancer Neg Hx    Social History   Socioeconomic History  . Marital status: Widowed    Spouse name: Not on file  . Number of children: 0  . Years of education: 16  . Highest education level: Bachelor's degree (e.g., BA, AB, BS)  Occupational History  . Occupation: retired    Fish farm manager: RETIRED  Tobacco Use  . Smoking status: Never Smoker  . Smokeless tobacco: Never Used  Vaping Use  . Vaping Use: Never used  Substance and Sexual Activity  . Alcohol  use: No  . Drug use: No  . Sexual activity: Not Currently  Other Topics Concern  . 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 Difficulty of Paying Living Expenses: Not very hard  Food Insecurity: No Food Insecurity  . Worried About Charity fundraiser in the Last Year: Never true  . Ran Out of Food in the Last Year: Never true  Transportation Needs: No Transportation Needs  . Lack of Transportation (Medical): No  . Lack of Transportation (Non-Medical): No  Physical Activity: Insufficiently Active  . Days of Exercise per Week: 7 days  . Minutes of Exercise per Session: 10 min  Stress: No Stress Concern Present  . Feeling of Stress : Only a little  Social Connections: Moderately Integrated  . Frequency of Communication with Friends and Family: More than three times a week  . Frequency of Social Gatherings with Friends and Family: More than three times a week  . Attends Religious Services: More than 4 times per year  . Active Member of Clubs or Organizations: Yes  .  Attends Archivist Meetings: More than 4 times per year  . Marital Status: Widowed    Activities of Daily Living In your present state of health, do you have any difficulty performing the following activities: 12/07/2020 04/27/2020  Hearing? N N  Vision? N N  Difficulty concentrating or making decisions? N N  Walking or climbing stairs? N N  Dressing or bathing? N N  Doing errands, shopping? N N  Preparing Food and eating ? N N  Using the Toilet? N N  In the past six months, have you accidently leaked urine? N N  Do you have problems with loss of bowel control? N N  Managing your Medications? N N  Managing your Finances? N N  Housekeeping or managing your Housekeeping? N N  Some recent data might be hidden    Patient Education/ Literacy How often do you need to have someone help you when you read instructions, pamphlets, or other written materials from your doctor or pharmacy?: 3 - Sometimes What is the last grade level you completed in school?: College  Exercise Current Exercise Habits: Home exercise routine, Type of exercise: walking, Time (Minutes): 20, Frequency (Times/Week): 5, Weekly Exercise (Minutes/Week): 100  Diet Patient reports consuming 3 meals a day and 1 snack(s) a day Patient reports that her primary diet is: Regular Patient reports that she does have regular access to food.   Depression Screen PHQ 2/9 Scores 12/07/2020 10/20/2020 04/28/2020 05/05/2019 01/27/2019 11/14/2018 09/11/2018  PHQ - 2 Score 0 0 0 0 0 0 0     Fall Risk Fall Risk  12/07/2020 10/20/2020 04/28/2020 04/27/2020 05/05/2019  Falls in the past year? 1 1 0 0 0  Comment - - - - -  Number falls in past yr: 0 - - 0 -  Injury with Fall? 0 0 - 0 -  Risk Factor Category  - - - - -  Risk for fall due to : Impaired balance/gait;Mental status change Mental status change - - -  Follow up Falls evaluation completed Falls evaluation completed - Falls prevention discussed;Falls evaluation completed -      Objective:  Kristi Sawyer seemed  alert and oriented and she participated appropriately during our telephone visit.  Blood Pressure Weight BMI  BP Readings from Last 3 Encounters:  10/20/20 137/69  09/14/20 (!) 152/66  04/28/20 121/70   Wt Readings from Last 3 Encounters:  10/20/20 92 lb 8 oz (42 kg)  09/14/20 93 lb 6.4 oz (42.4 kg)  04/28/20 98 lb 12.8 oz (44.8 kg)   BMI Readings from Last 1 Encounters:  10/20/20 18.07 kg/m    *Unable to obtain current vital signs, weight, and BMI due to telephone visit type  Hearing/Vision  . Kristi Sawyer did not seem to have difficulty with hearing/understanding during the telephone conversation . Reports that she has had a formal eye exam by an eye care professional within the past year . Reports that she has not had a formal hearing evaluation within the past year *Unable to fully assess hearing and vision during telephone visit type  Cognitive Function: 6CIT Screen 12/07/2020 05/05/2019  What Year? 0 points 0 points  What month? 0 points 0 points  What time? 0 points 0 points  Count back from 20 0 points 0 points  Months in reverse 4 points 0 points  Repeat phrase 4 points 0 points  Total Score 8 0   (Normal:0-7, Significant for Dysfunction: >8)  Normal Cognitive Function Screening: Yes   Immunization & Health Maintenance Record Immunization History  Administered Date(s) Administered  . Fluad Quad(high Dose 65+) 08/18/2020  . Influenza, High Dose Seasonal PF 08/07/2017, 09/11/2018  . Influenza,inj,Quad PF,6+ Mos 08/04/2013, 08/18/2014, 09/16/2015, 08/22/2016  . Influenza-Unspecified 08/11/2009, 08/04/2010, 09/13/2011, 09/16/2015  . PFIZER(Purple Top)SARS-COV-2 Vaccination 12/05/2019, 12/26/2019  . Pneumococcal Conjugate-13 11/05/2013  . Pneumococcal Polysaccharide-23 11/14/1995  . Zoster 12/27/2010    Health Maintenance  Topic Date Due  . COVID-19 Vaccine (3 - Booster for Pfizer series) 06/24/2020  . MAMMOGRAM  05/12/2021  . DEXA  SCAN  10/21/2022  . TETANUS/TDAP  06/04/2023  . INFLUENZA VACCINE  Completed  . PNA vac Low Risk Adult  Completed       Assessment  This is a routine wellness examination for Kristi Sawyer.  Health Maintenance: Due or Overdue Health Maintenance Due  Topic Date Due  . COVID-19 Vaccine (3 - Booster for Pfizer series) 06/24/2020    Kristi Sawyer does not need a referral for Community Assistance: Care Management:   no Social Work:    no Prescription Assistance:  no Nutrition/Diabetes Education:  no   Plan:  Personalized Goals Goals Addressed              This Visit's Progress     Patient Stated   .  "I need to get my Prolia Injection" (pt-stated)   On track     [place] (see longitudinal plan of care for additional care plan information)  Current Barriers:  . Care Coordination needs related to Prolia Injection in a patient with osteoporosis (disease states) . Problems with ordering medication  Nurse Case Manager Clinical Goal(s):  Marland Kitchen Over the next 30 days, patient will work with [facility] staff to obtain Prolia Injection  Interventions:  . Inter-disciplinary care team collaboration (see longitudinal plan of care) . Chart reviewed including recent office notes, telephone notes, and bone density scan results o Bone density scan due after 09/11/2020 . Collaborated with Pricilla Riffle, LPN regarding Prolia authorization and order . Advised patient that the order process has not been completed but that [name] will reach out to her with an update over the next  few days . Collaborated with [facility] clinical staff to cancel patient's appt tomorrow for injection since medication has not been ordered/received . Encouraged patient to reach out to PCP office or CCM with any questions or concerns  Patient Self Care Activities:  . Performs ADL's independently . Performs IADL's independently  Initial goal documentation       Other   .  Exercise 150 minutes per week  (moderate activity)   On track     [place] job - keep up daily walking      Personalized Health Maintenance & Screening Recommendations   Lung Cancer Screening Recommended: no (Low Dose CT Chest recommended if Age 69-80 years, 30 pack-year currently smoking OR have quit w/in past 15 years) Hepatitis C Screening recommended: no HIV Screening recommended: no  Advanced Directives: Written information was not prepared per patient's request.  Referrals & Orders No orders of the defined types were placed in this encounter.   Follow-up Plan . Follow-up with Dettinger, Fransisca Kaufmann, MD as planned . Schedule 04/20/21   I have personally reviewed and noted the following in the patient's chart:   . Medical and social history . Use of alcohol, tobacco or illicit drugs  . Current medications and supplements . Functional ability and status . Nutritional status . Physical activity . Advanced directives . List of other physicians . Hospitalizations, surgeries, and ER visits in previous 12 months . Vitals . Screenings to include cognitive, depression, and falls . Referrals and appointments  In addition, I have reviewed and discussed with Kristi Sawyer certain preventive protocols, quality metrics, and best practice recommendations. A written personalized care plan for preventive services as well as general preventive health recommendations is available and can be mailed to the patient at her request.      Maud Deed [facility]  [id number]

## 2020-12-27 ENCOUNTER — Telehealth: Payer: Self-pay

## 2020-12-27 NOTE — Telephone Encounter (Signed)
Informed pt that she is not due for Prolia until March 21st. Note put in Prolia folder to remind.

## 2021-01-04 ENCOUNTER — Telehealth: Payer: Self-pay

## 2021-01-04 NOTE — Telephone Encounter (Signed)
Verified that pt is due for Prolia on or after Feb 12th. We have the injection here. Pt made aware and is scheduled for nurse visit 01/05/21.

## 2021-01-05 ENCOUNTER — Other Ambulatory Visit: Payer: Self-pay

## 2021-01-05 ENCOUNTER — Ambulatory Visit (INDEPENDENT_AMBULATORY_CARE_PROVIDER_SITE_OTHER): Payer: Medicare Other | Admitting: *Deleted

## 2021-01-05 DIAGNOSIS — M810 Age-related osteoporosis without current pathological fracture: Secondary | ICD-10-CM | POA: Diagnosis not present

## 2021-01-05 MED ORDER — DENOSUMAB 60 MG/ML ~~LOC~~ SOSY
60.0000 mg | PREFILLED_SYRINGE | Freq: Once | SUBCUTANEOUS | Status: AC
  Administered 2021-01-05: 60 mg via SUBCUTANEOUS

## 2021-01-26 ENCOUNTER — Other Ambulatory Visit: Payer: Self-pay

## 2021-01-26 ENCOUNTER — Ambulatory Visit (INDEPENDENT_AMBULATORY_CARE_PROVIDER_SITE_OTHER): Payer: Medicare Other | Admitting: Ophthalmology

## 2021-01-26 ENCOUNTER — Encounter (INDEPENDENT_AMBULATORY_CARE_PROVIDER_SITE_OTHER): Payer: Self-pay | Admitting: Ophthalmology

## 2021-01-26 DIAGNOSIS — H43812 Vitreous degeneration, left eye: Secondary | ICD-10-CM | POA: Diagnosis not present

## 2021-01-26 DIAGNOSIS — H35371 Puckering of macula, right eye: Secondary | ICD-10-CM | POA: Diagnosis not present

## 2021-01-26 DIAGNOSIS — H35372 Puckering of macula, left eye: Secondary | ICD-10-CM

## 2021-01-26 NOTE — Progress Notes (Signed)
01/26/2021     CHIEF COMPLAINT Patient presents for Retina Follow Up (2 Yr F/U OU//Pt denies noticeable changes to [place] OU since last visit. Pt denies ocular pain, flashes of light, or floaters OU. //)   HISTORY OF PRESENT ILLNESS: Kristi Sawyer is a 87 y.o. female who presents to the clinic today for:   HPI    Retina Follow Up    Patient presents with  Other.  In both eyes.  This started 2 years ago.  Severity is mild.  Duration of 2 years.  Since onset it is stable. Additional comments: 2 Yr F/U OU  Pt denies noticeable changes to [place] OU since last visit. Pt denies ocular pain, flashes of light, or floaters OU.          Last edited by Rockie Neighbours, [place] on 01/26/2021  1:10 PM. (History)      Referring physician: Dettinger, Fransisca Kaufmann, MD [place],  [place] [address]  HISTORICAL INFORMATION:   Selected notes from the [facility]: No current outpatient medications on file. (Ophthalmic Drugs)   No current facility-administered medications for this visit. (Ophthalmic Drugs)   Current Outpatient Medications (Other)  Medication Sig  . amLODipine (NORVASC) 5 MG tablet Take 1 tablet (5 mg total) by mouth daily.  Marland Kitchen aspirin 81 MG EC tablet Take 81 mg by mouth daily.  . Aspirin-Acetaminophen-Caffeine 520-260-32.5 MG PACK Take by mouth.  . Calcium Carbonate-Vit D-Min (CALTRATE PLUS PO) Take 1 tablet by mouth daily.  . Cholecalciferol (VITAMIN D) 2000 UNITS CAPS Take by mouth as directed. Take 1 tablet daily except none on saturdays and sundays  . denosumab (PROLIA) 60 MG/ML SOSY injection Inject 60 mg into the skin every 6 (six) months.  . Fe Fum-FePoly-Vit C-Vit B3 (INTEGRA) 62.5-62.5-40-3 MG CAPS TAKE 1 CAPSULE BY MOUTH EVERY DAY  . hydrALAZINE (APRESOLINE) 10 MG tablet Take 1 tablet (10 mg total) by mouth every 8 (eight) hours.  Marland Kitchen ibuprofen (ADVIL) 200 MG tablet Take 200 mg by mouth every 6 (six) hours as needed.  . Inulin 1.5 g CHEW  Chew 5 tablets by mouth daily. FIBER CHOICE  . metoCLOPramide (REGLAN) 10 MG tablet Take 1 tablet (10 mg total) by mouth at bedtime.  . mirabegron ER (MYRBETRIQ) 50 MG TB24 tablet Take 1 tablet (50 mg total) by mouth daily.  . Multiple Vitamins-Minerals (CENTRUM SILVER PO) Take 1 tablet by mouth daily.  . Omega-3 Fatty Acids (FISH OIL) 1000 MG CAPS Take 1 capsule by mouth 3 (three) times daily.   Marland Kitchen omeprazole (PRILOSEC) 20 MG capsule Take 1-2 capsules (20-40 mg total) by mouth daily.  . rosuvastatin (CRESTOR) 5 MG tablet TAKE 1 TABLET BY MOUTH ON MONDAY, WEDNESDAY, AND FRIDAY   No current facility-administered medications for this visit. (Other)      REVIEW OF SYSTEMS:    ALLERGIES Allergies  Allergen Reactions  . Prevnar 13 [Pneumococcal 13-Val Conj Vacc] Hives    Red/streaking arm  . Penicillins Hives    Has patient had a PCN reaction causing immediate rash, facial/tongue/throat swelling, SOB or lightheadedness with hypotension: No Has patient had a PCN reaction causing severe rash involving mucus membranes or skin necrosis: No Has patient had a PCN reaction that required hospitalization: No Has patient had a PCN reaction occurring within the last 10 years: No If all of the above answers are "NO", then may proceed with Cephalosporin use.   Marland Kitchen  Tetanus Toxoids Other (See Comments)    Area of injection was red and streaked.  . Codeine Nausea Only  . Sulfonamide Derivatives Nausea Only    PAST MEDICAL HISTORY Past Medical History:  Diagnosis Date  . Acid reflux   . Anemia   . Anxiety   . Cataracts, bilateral   . Celiac disease/sprue   . Chronic gastritis   . Esophageal stricture   . Euthyroid   . Gastritis   . Hiatal hernia   . Hyperlipidemia   . Hypermobility syndrome   . 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 Neuropathy   . Osteoporosis   . Palpitations   . Postmenopausal HRT (hormone replacement therapy)   . Shingles    x 3  . Thyroid tumor, benign     Past Surgical History:  Procedure Laterality Date  . ABDOMINAL HYSTERECTOMY    . APPENDECTOMY  1935  . CATARACT EXTRACTION Bilateral   . COLONOSCOPY    . ESOPHAGOGASTRODUODENOSCOPY    . EYE SURGERY    . MYOMECTOMY    . PARTIAL HYSTERECTOMY  1974   w 1/2 right ovary   . RETINAL LASER PROCEDURE    . SIGMOIDOSCOPY    . THYROIDECTOMY  1975   rt. benign tumor     FAMILY HISTORY Family History  Problem Relation Age of Onset  . Healthy Mother   . Lung cancer Father   . Colon cancer Neg Hx   . Stomach cancer Neg Hx   . Esophageal cancer Neg Hx     SOCIAL HISTORY Social History   Tobacco Use  . Smoking status: Never Smoker  . Smokeless tobacco: Never Used  Vaping Use  . Vaping Use: Never used  Substance Use Topics  . Alcohol use: No  . Drug use: No         OPHTHALMIC EXAM: Base Eye Exam    Visual Acuity (ETDRS)      Right Left   Dist cc 20/30 -2 20/30 +2   Dist ph cc NI NI   Correction: Glasses       Tonometry (Tonopen, 1:12 PM)      Right Left   Pressure 17 24       Tonometry #2 (Tonopen, 1:16 PM)      Right Left   Pressure  24       Pupils      Pupils Dark Light Shape React APD   Right PERRL 3.5 2.5 Round Slow None   Left PERRL 3.5 2.5 Round Slow None       Visual Fields (Counting fingers)      Left Right    Full Full       Extraocular Movement      Right Left    Full Full       Neuro/Psych    Oriented x3: Yes   Mood/Affect: Normal       Dilation    Both eyes: 1.0% Mydriacyl, 2.5% Phenylephrine @ 1:16 PM        Slit Lamp and Fundus Exam    External Exam      Right Left   External Normal Normal       Slit Lamp Exam      Right Left   Lids/Lashes Normal Normal   Conjunctiva/Sclera White and quiet White and quiet   Cornea Clear Clear   Anterior Chamber Deep and quiet Deep and quiet   Iris Round and reactive Round and reactive  Lens Centered posterior chamber intraocular lens Centered posterior chamber intraocular lens    Anterior Vitreous Normal Normal       Fundus Exam      Right Left   Posterior Vitreous Avitric Posterior vitreous detachment   Disc Normal Normal   C/D Ratio 0.5 0.35   Macula Normal Epiretinal membrane, mild   Vessels Normal Normal   Periphery Normal Normal          IMAGING AND PROCEDURES  Imaging and Procedures for 01/26/21  OCT, Retina - OU - Both Eyes       Right Eye Quality was good. Scan locations included subfoveal. Central Foveal Thickness: 380. Progression has been stable.   Left Eye Quality was good. Scan locations included subfoveal. Central Foveal Thickness: 313. Progression has been stable. Findings include epiretinal membrane.   Notes Irregular contour to macula OD as a consequence of prior epiretinal membrane now post vitrectomy, since 2017  OS Minor epiretinal membrane no topographic distortion                ASSESSMENT/PLAN:  Left epiretinal membrane The nature of macular pucker (epiretinal membrane ERM) was discussed with the patient as well as threshold criteria for vitrectomy surgery. I explained that in rare cases another surgery is needed to actually remove a second wrinkle should it regrow.  Most often, the epiretinal membrane and underlying wrinkled internal limiting membrane are removed with the first surgery, to accomplish the goals.   If the operative eye is [place] (natural lens still present), cataract surgery is often recommended prior to Vitrectomy. This will enable the retina surgeon to have the best view during surgery and the patient to obtain optimal results in the future. Treatment options were discussed.  No topographic distortion left eye will observe      ICD-10-CM   1. Left epiretinal membrane  H35.372 OCT, Retina - OU - Both Eyes  2. Right epiretinal membrane  H35.371   3. Posterior vitreous detachment of left eye  H43.812     1.  Moderate topographic distortion left eye with normal and preserve visual acuity with minor  epiretinal membrane will continue to observe 2.  OD, doing very well post vitrectomy membrane peel in 2017  3.  Ophthalmic Meds Ordered this visit:  No orders of the defined types were placed in this encounter.      No follow-ups on file.  There are no Patient Instructions on file for this visit.   Explained the diagnoses, plan, and follow up with the patient and they expressed understanding.  Patient expressed understanding of the importance of proper follow up care.   Clent Demark Adis Sturgill M.D. Diseases & Surgery of the Retina and Vitreous [facility] 01/26/21     Abbreviations: M myopia (nearsighted); A astigmatism; H hyperopia (farsighted); P presbyopia; Mrx spectacle prescription;  CTL contact lenses; OD right eye; OS left eye; OU both eyes  XT exotropia; ET esotropia; PEK punctate epithelial keratitis; PEE punctate epithelial erosions; DES dry eye syndrome; MGD meibomian gland dysfunction; ATs artificial tears; PFAT's preservative free artificial tears; [place] nuclear sclerotic cataract; PSC posterior subcapsular cataract; ERM epi-retinal membrane; PVD posterior vitreous detachment; RD retinal detachment; DM diabetes mellitus; DR diabetic retinopathy; NPDR non-proliferative diabetic retinopathy; PDR proliferative diabetic retinopathy; CSME clinically significant macular edema; DME diabetic macular edema; dbh dot blot hemorrhages; CWS cotton wool spot; POAG primary open angle glaucoma; C/D cup-to-disc ratio; HVF humphrey visual field; GVF goldmann visual field; OCT optical coherence tomography;  IOP intraocular pressure; BRVO Branch retinal vein occlusion; CRVO central retinal vein occlusion; CRAO central retinal artery occlusion; BRAO branch retinal artery occlusion; RT retinal tear; SB scleral buckle; PPV pars plana vitrectomy; VH Vitreous hemorrhage; PRP panretinal laser photocoagulation; IVK intravitreal kenalog; VMT vitreomacular traction; MH Macular hole;  NVD  neovascularization of the disc; NVE neovascularization elsewhere; AREDS age related eye disease study; ARMD age related macular degeneration; POAG primary open angle glaucoma; EBMD epithelial/anterior basement membrane dystrophy; ACIOL anterior chamber intraocular lens; IOL intraocular lens; PCIOL posterior chamber intraocular lens; Phaco/IOL phacoemulsification with intraocular lens placement; [place] photorefractive keratectomy; LASIK laser assisted in situ keratomileusis; HTN hypertension; DM diabetes mellitus; COPD chronic obstructive pulmonary disease

## 2021-01-26 NOTE — Assessment & Plan Note (Signed)
The nature of macular pucker (epiretinal membrane ERM) was discussed with the patient as well as threshold criteria for vitrectomy surgery. I explained that in rare cases another surgery is needed to actually remove a second wrinkle should it regrow.  Most often, the epiretinal membrane and underlying wrinkled internal limiting membrane are removed with the first surgery, to accomplish the goals.   If the operative eye is [place] (natural lens still present), cataract surgery is often recommended prior to Vitrectomy. This will enable the retina surgeon to have the best view during surgery and the patient to obtain optimal results in the future. Treatment options were discussed.  No topographic distortion left eye will observe

## 2021-03-25 DIAGNOSIS — Z23 Encounter for immunization: Secondary | ICD-10-CM | POA: Diagnosis not present

## 2021-04-20 ENCOUNTER — Encounter: Payer: Self-pay | Admitting: Family Medicine

## 2021-04-20 ENCOUNTER — Ambulatory Visit (INDEPENDENT_AMBULATORY_CARE_PROVIDER_SITE_OTHER): Payer: Medicare Other | Admitting: Family Medicine

## 2021-04-20 ENCOUNTER — Other Ambulatory Visit: Payer: Self-pay

## 2021-04-20 VITALS — BP 135/76 | HR 74 | Ht 60.0 in | Wt 95.0 lb

## 2021-04-20 DIAGNOSIS — M810 Age-related osteoporosis without current pathological fracture: Secondary | ICD-10-CM

## 2021-04-20 DIAGNOSIS — E782 Mixed hyperlipidemia: Secondary | ICD-10-CM

## 2021-04-20 DIAGNOSIS — Z23 Encounter for immunization: Secondary | ICD-10-CM | POA: Diagnosis not present

## 2021-04-20 DIAGNOSIS — I10 Essential (primary) hypertension: Secondary | ICD-10-CM

## 2021-04-20 DIAGNOSIS — K219 Gastro-esophageal reflux disease without esophagitis: Secondary | ICD-10-CM

## 2021-04-20 NOTE — Progress Notes (Signed)
BP 135/76   Pulse 74   Ht 5' (1.524 m)   Wt 95 lb (43.1 kg)   SpO2 97%   BMI 18.55 kg/m    Subjective:   Patient ID: Kristi Sawyer, female    DOB: Jun 16, 1926, 87 y.o.   MRN: [id number]  HPI: Kristi Sawyer is a 87 y.o. female presenting on 04/20/2021 for Medical Management of Chronic Issues and Hypertension   HPI Hypertension Patient is currently on amlodipine and hydralazine, and their blood pressure today is 135/76. Patient denies any lightheadedness or dizziness. Patient denies headaches, blurred vision, chest pains, shortness of breath, or weakness. Denies any side effects from medication and is content with current medication.   Hyperlipidemia Patient is coming in for recheck of his hyperlipidemia. The patient is currently taking fish oil and Crestor. They deny any issues with myalgias or history of liver damage from it. They deny any focal numbness or weakness or chest pain.   Osteoporosis/osteopenia Fractures or history of fracture: Now Medication: Prolia Duration of treatment: Just under 6 months Last bone density scan: 1 year ago Last T score: -3.5  GERD Patient is currently on omeprazole as needed.  She denies any major symptoms or abdominal pain or belching or burping. She denies any blood in her stool or lightheadedness or dizziness.   Relevant past medical, surgical, family and social history reviewed and updated as indicated. Interim medical history since our last visit reviewed. Allergies and medications reviewed and updated.  Review of Systems  Constitutional: Negative for chills and fever.  Eyes: Negative for visual disturbance.  Respiratory: Negative for chest tightness and shortness of breath.   Cardiovascular: Negative for chest pain and leg swelling.  Musculoskeletal: Negative for back pain and gait problem.  Skin: Negative for rash.  Neurological: Negative for light-headedness and headaches.  Psychiatric/Behavioral: Negative for agitation and behavioral  problems.  All other systems reviewed and are negative.   Per HPI unless specifically indicated above   Allergies as of 04/20/2021      Reactions   Prevnar 13 [pneumococcal 13-val Conj Vacc] Hives   Red/streaking arm   Penicillins Hives   Has patient had a PCN reaction causing immediate rash, facial/tongue/throat swelling, SOB or lightheadedness with hypotension: No Has patient had a PCN reaction causing severe rash involving mucus membranes or skin necrosis: No Has patient had a PCN reaction that required hospitalization: No Has patient had a PCN reaction occurring within the last 10 years: No If all of the above answers are "NO", then may proceed with Cephalosporin use.   Tetanus Toxoids Other (See Comments)   Area of injection was red and streaked.   Codeine Nausea Only   Sulfonamide Derivatives Nausea Only      Medication List       Accurate as of April 20, 2021 11:05 AM. If you have any questions, ask your nurse or doctor.        amLODipine 5 MG tablet Commonly known as: NORVASC Take 1 tablet (5 mg total) by mouth daily.   aspirin 81 MG EC tablet Take 81 mg by mouth daily.   Aspirin-Acetaminophen-Caffeine 520-260-32.5 MG Pack Take by mouth.   CALTRATE PLUS PO Take 1 tablet by mouth daily.   CENTRUM SILVER PO Take 1 tablet by mouth daily.   denosumab 60 MG/ML Sosy injection Commonly known as: PROLIA Inject 60 mg into the skin every 6 (six) months.   Fish Oil 1000 MG Caps Take 1 capsule by mouth 3 (  three) times daily.   hydrALAZINE 10 MG tablet Commonly known as: APRESOLINE Take 1 tablet (10 mg total) by mouth every 8 (eight) hours.   ibuprofen 200 MG tablet Commonly known as: ADVIL Take 200 mg by mouth every 6 (six) hours as needed.   Integra 62.5-62.5-40-3 MG Caps TAKE 1 CAPSULE BY MOUTH EVERY DAY   Inulin 1.5 g Chew Chew 5 tablets by mouth daily. FIBER CHOICE   metoCLOPramide 10 MG tablet Commonly known as: REGLAN Take 1 tablet (10 mg total) by  mouth at bedtime.   mirabegron ER 50 MG Tb24 tablet Commonly known as: Myrbetriq Take 1 tablet (50 mg total) by mouth daily.   omeprazole 20 MG capsule Commonly known as: PRILOSEC Take 1-2 capsules (20-40 mg total) by mouth daily.   rosuvastatin 5 MG tablet Commonly known as: CRESTOR TAKE 1 TABLET BY MOUTH ON MONDAY, WEDNESDAY, AND FRIDAY   Vitamin D 50 MCG (2000 UT) Caps Take by mouth as directed. Take 1 tablet daily except none on saturdays and sundays        Objective:   BP 135/76   Pulse 74   Ht 5' (1.524 m)   Wt 95 lb (43.1 kg)   SpO2 97%   BMI 18.55 kg/m   Wt Readings from Last 3 Encounters:  04/20/21 95 lb (43.1 kg)  10/20/20 92 lb 8 oz (42 kg)  09/14/20 93 lb 6.4 oz (42.4 kg)    Physical Exam Vitals and nursing note reviewed.  Constitutional:      General: She is not in acute distress.    Appearance: She is well-developed. She is not diaphoretic.  Eyes:     Conjunctiva/sclera: Conjunctivae normal.  Cardiovascular:     Rate and Rhythm: Normal rate and regular rhythm.     Heart sounds: Normal heart sounds. No murmur heard.   Pulmonary:     Effort: Pulmonary effort is normal. No respiratory distress.     Breath sounds: Normal breath sounds. No wheezing.  Musculoskeletal:        General: No tenderness. Normal range of motion.  Skin:    General: Skin is warm and dry.     Findings: No rash.  Neurological:     Mental Status: She is alert and oriented to person, place, and time.     Coordination: Coordination normal.  Psychiatric:        Behavior: Behavior normal.       Assessment & Plan:   Problem List Items Addressed This Visit      Cardiovascular and Mediastinum   Hypertension - Primary   Relevant Orders   CBC with Differential/Platelet   CMP14+EGFR   Lipid panel   VITAMIN D 25 Hydroxy (Vit-D Deficiency, Fractures)     Digestive   GERD (gastroesophageal reflux disease)     Musculoskeletal and Integument   Osteoporosis   Relevant  Orders   VITAMIN D 25 Hydroxy (Vit-D Deficiency, Fractures)     Other   Hyperlipidemia   Relevant Orders   Lipid panel      Continue current medication, will do blood work today. Follow up plan: Return in about 6 months (around 10/20/2021), or if symptoms worsen or fail to improve, for Hypertension and cholesterol and osteoporosis recheck.  Counseling provided for all of the vaccine components No orders of the defined types were placed in this encounter.   [name], MD [facility] 04/20/2021, 11:05 AM

## 2021-04-20 NOTE — Addendum Note (Signed)
Addended by: Alphonzo Dublin on: 04/20/2021 11:24 AM   Modules accepted: Orders

## 2021-04-22 ENCOUNTER — Telehealth: Payer: Self-pay | Admitting: Family Medicine

## 2021-04-22 NOTE — Telephone Encounter (Signed)
Pt wants to speak with nurse about her arm.

## 2021-04-22 NOTE — Telephone Encounter (Signed)
Pt states that her arm is sore when she raises it. The redness and swelling is just at the injection site. Informed pt that the symptoms that she is having is normal after an injection. She may take IBU and use cool compresses if needed. Pt understood.

## 2021-05-17 DIAGNOSIS — Z1231 Encounter for screening mammogram for malignant neoplasm of breast: Secondary | ICD-10-CM | POA: Diagnosis not present

## 2021-05-26 DIAGNOSIS — D0462 Carcinoma in situ of skin of left upper limb, including shoulder: Secondary | ICD-10-CM | POA: Diagnosis not present

## 2021-05-26 DIAGNOSIS — X32XXXA Exposure to sunlight, initial encounter: Secondary | ICD-10-CM | POA: Diagnosis not present

## 2021-05-26 DIAGNOSIS — L570 Actinic keratosis: Secondary | ICD-10-CM | POA: Diagnosis not present

## 2021-06-01 ENCOUNTER — Other Ambulatory Visit: Payer: Self-pay | Admitting: Family Medicine

## 2021-06-27 ENCOUNTER — Telehealth: Payer: Self-pay | Admitting: Family Medicine

## 2021-06-27 DIAGNOSIS — Z85828 Personal history of other malignant neoplasm of skin: Secondary | ICD-10-CM | POA: Diagnosis not present

## 2021-06-27 DIAGNOSIS — Z08 Encounter for follow-up examination after completed treatment for malignant neoplasm: Secondary | ICD-10-CM | POA: Diagnosis not present

## 2021-08-02 ENCOUNTER — Other Ambulatory Visit: Payer: Self-pay | Admitting: Internal Medicine

## 2021-08-02 ENCOUNTER — Other Ambulatory Visit: Payer: Self-pay | Admitting: Family Medicine

## 2021-08-03 ENCOUNTER — Telehealth: Payer: Self-pay | Admitting: Family Medicine

## 2021-08-03 NOTE — Telephone Encounter (Signed)
Pt is calling to check on her prolia shot. She called on 8/15 to find out when she needed to schedule an appt to get the shot and never heard back. Please call and advise.

## 2021-08-03 NOTE — Telephone Encounter (Signed)
Patient aware that she is due around 09/23 and that we are waiting on getting her benefits approved.

## 2021-08-10 ENCOUNTER — Ambulatory Visit: Payer: Medicare Other

## 2021-08-12 DIAGNOSIS — Z23 Encounter for immunization: Secondary | ICD-10-CM | POA: Diagnosis not present

## 2021-08-26 ENCOUNTER — Other Ambulatory Visit: Payer: Self-pay

## 2021-08-26 ENCOUNTER — Ambulatory Visit (INDEPENDENT_AMBULATORY_CARE_PROVIDER_SITE_OTHER): Payer: Medicare Other

## 2021-08-26 ENCOUNTER — Telehealth: Payer: Self-pay | Admitting: Family Medicine

## 2021-08-26 DIAGNOSIS — Z23 Encounter for immunization: Secondary | ICD-10-CM | POA: Diagnosis not present

## 2021-08-26 NOTE — Telephone Encounter (Signed)
Patient is unable to continue the Prolia injection.  It is too expensive for her.  What will this mean for her and will it affect her stopping it?

## 2021-08-29 NOTE — Telephone Encounter (Signed)
Pts number busy

## 2021-08-29 NOTE — Telephone Encounter (Signed)
Please have her make an appointment with Almyra Free or me to discuss options, if needed can place community care medicine referral to Almyra Free if she has not seen her before

## 2021-09-06 NOTE — Telephone Encounter (Signed)
Lmtcb No call back  This encounter will be closed

## 2021-09-09 ENCOUNTER — Encounter (HOSPITAL_COMMUNITY): Payer: Self-pay | Admitting: Emergency Medicine

## 2021-09-09 ENCOUNTER — Observation Stay (HOSPITAL_COMMUNITY)
Admission: EM | Admit: 2021-09-09 | Discharge: 2021-09-10 | Disposition: A | Discharge status: Home / Self Care | Payer: Medicare Other | Attending: Internal Medicine | Admitting: Internal Medicine

## 2021-09-09 ENCOUNTER — Other Ambulatory Visit: Payer: Self-pay

## 2021-09-09 ENCOUNTER — Emergency Department (HOSPITAL_COMMUNITY): Payer: Medicare Other

## 2021-09-09 DIAGNOSIS — E860 Dehydration: Secondary | ICD-10-CM | POA: Diagnosis not present

## 2021-09-09 DIAGNOSIS — I10 Essential (primary) hypertension: Secondary | ICD-10-CM | POA: Insufficient documentation

## 2021-09-09 DIAGNOSIS — Z794 Long term (current) use of insulin: Secondary | ICD-10-CM | POA: Diagnosis not present

## 2021-09-09 DIAGNOSIS — R260 Ataxic gait: Secondary | ICD-10-CM

## 2021-09-09 DIAGNOSIS — M6281 Muscle weakness (generalized): Secondary | ICD-10-CM | POA: Insufficient documentation

## 2021-09-09 DIAGNOSIS — Z79899 Other long term (current) drug therapy: Secondary | ICD-10-CM | POA: Diagnosis not present

## 2021-09-09 DIAGNOSIS — R2681 Unsteadiness on feet: Secondary | ICD-10-CM | POA: Diagnosis not present

## 2021-09-09 DIAGNOSIS — R002 Palpitations: Secondary | ICD-10-CM | POA: Diagnosis not present

## 2021-09-09 DIAGNOSIS — Z20822 Contact with and (suspected) exposure to covid-19: Secondary | ICD-10-CM | POA: Insufficient documentation

## 2021-09-09 DIAGNOSIS — Z743 Need for continuous supervision: Secondary | ICD-10-CM | POA: Diagnosis not present

## 2021-09-09 DIAGNOSIS — R531 Weakness: Principal | ICD-10-CM | POA: Insufficient documentation

## 2021-09-09 DIAGNOSIS — R6889 Other general symptoms and signs: Secondary | ICD-10-CM | POA: Diagnosis not present

## 2021-09-09 DIAGNOSIS — R269 Unspecified abnormalities of gait and mobility: Secondary | ICD-10-CM | POA: Diagnosis not present

## 2021-09-09 DIAGNOSIS — Z7982 Long term (current) use of aspirin: Secondary | ICD-10-CM | POA: Diagnosis not present

## 2021-09-09 LAB — CBC WITH DIFFERENTIAL/PLATELET
Abs Immature Granulocytes: 0.06 10*3/uL (ref 0.00–0.07)
Basophils Absolute: 0 10*3/uL (ref 0.0–0.1)
Basophils Relative: 0 %
Eosinophils Absolute: 0 10*3/uL (ref 0.0–0.5)
Eosinophils Relative: 0 %
HCT: 38.1 % (ref 36.0–46.0)
Hemoglobin: 12.9 g/dL (ref 12.0–15.0)
Immature Granulocytes: 1 %
Lymphocytes Relative: 22 %
Lymphs Abs: 2.3 10*3/uL (ref 0.7–4.0)
MCH: 31.3 pg (ref 26.0–34.0)
MCHC: 33.9 g/dL (ref 30.0–36.0)
MCV: 92.5 fL (ref 80.0–100.0)
Monocytes Absolute: 1.1 10*3/uL — ABNORMAL HIGH (ref 0.1–1.0)
Monocytes Relative: 11 %
Neutro Abs: 6.9 10*3/uL (ref 1.7–7.7)
Neutrophils Relative %: 66 %
Platelets: 214 10*3/uL (ref 150–400)
RBC: 4.12 MIL/uL (ref 3.87–5.11)
RDW: 12.3 % (ref 11.5–15.5)
WBC: 10.5 10*3/uL (ref 4.0–10.5)
nRBC: 0 % (ref 0.0–0.2)

## 2021-09-09 LAB — COMPREHENSIVE METABOLIC PANEL
ALT: 19 U/L (ref 0–44)
AST: 20 U/L (ref 15–41)
Albumin: 3.7 g/dL (ref 3.5–5.0)
Alkaline Phosphatase: 35 U/L — ABNORMAL LOW (ref 38–126)
Anion gap: 9 (ref 5–15)
BUN: 11 mg/dL (ref 8–23)
CO2: 27 mmol/L (ref 22–32)
Calcium: 8.9 mg/dL (ref 8.9–10.3)
Chloride: 96 mmol/L — ABNORMAL LOW (ref 98–111)
Creatinine, Ser: 0.56 mg/dL (ref 0.44–1.00)
GFR, Estimated: >60 mL/min (ref >60)
Glucose, Bld: 100 mg/dL — ABNORMAL HIGH (ref 70–99)
Potassium: 3.5 mmol/L (ref 3.5–5.1)
Sodium: 132 mmol/L — ABNORMAL LOW (ref 135–145)
Total Bilirubin: 0.4 mg/dL (ref 0.3–1.2)
Total Protein: 6.5 g/dL (ref 6.5–8.1)

## 2021-09-09 LAB — URINALYSIS, ROUTINE W REFLEX MICROSCOPIC
Bilirubin Urine: NEGATIVE
Glucose, UA: NEGATIVE mg/dL
Ketones, ur: 20 mg/dL — AB
Nitrite: NEGATIVE
Protein, ur: NEGATIVE mg/dL
Specific Gravity, Urine: 1.006 (ref 1.005–1.030)
pH: 7 (ref 5.0–8.0)

## 2021-09-09 LAB — RESP PANEL BY RT-PCR (FLU A&B, COVID) ARPGX2
Influenza A by PCR: NEGATIVE
Influenza B by PCR: NEGATIVE
SARS Coronavirus 2 by RT PCR: NEGATIVE

## 2021-09-09 MED ORDER — SODIUM CHLORIDE 0.9 % IV SOLN
1.0000 g | INTRAVENOUS | Status: DC
  Administered 2021-09-10: 1 g via INTRAVENOUS
  Filled 2021-09-09: qty 10

## 2021-09-09 MED ORDER — SODIUM CHLORIDE 0.9 % IV BOLUS
500.0000 mL | Freq: Once | INTRAVENOUS | Status: AC
  Administered 2021-09-10: 500 mL via INTRAVENOUS

## 2021-09-09 NOTE — ED Notes (Signed)
Took pt to the bathroom via wheelchair

## 2021-09-09 NOTE — ED Provider Notes (Signed)
[facility] EMERGENCY DEPARTMENT Provider Note   CSN: [id number] Arrival date & time: 09/09/21  1835     History Chief Complaint  Patient presents with   Weakness    Kristi Sawyer is a 87 y.o. female.  HPI She presents for evaluation of trouble with "balance," since she woke this morning.  She is not able to either stand or walk because of this balance problem.  She denies headache, chest pain, shortness of breath.  She states that she previously had this problem 2 years ago and was evaluated in the hospital and discharged.  She does not know she had a stroke previously.  There are no other known active modifying factors.    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    Neuropathy    Osteoporosis    Palpitations    Postmenopausal HRT (hormone replacement therapy)    Shingles    x 3   Thyroid tumor, benign     Patient Active Problem List   Diagnosis Date Noted   Right epiretinal membrane 01/26/2021   Left epiretinal membrane 01/26/2021   Posterior vitreous detachment of left eye 01/26/2021   Osteoporosis without current pathological fracture 11/23/2020   Hypertension 10/28/2017   Facial contusion, initial encounter    Generalized weakness 10/26/2017   Overactive bladder 06/18/2015   Hyperlipidemia 09/26/2010   ANXIETY 09/26/2010   GERD (gastroesophageal reflux disease) 09/26/2010   CELIAC SPRUE 09/26/2010   Osteoporosis 09/26/2010    Past Surgical History:  Procedure Laterality Date   ABDOMINAL HYSTERECTOMY     APPENDECTOMY  1935   CATARACT EXTRACTION Bilateral    COLONOSCOPY     ESOPHAGOGASTRODUODENOSCOPY     EYE SURGERY     MYOMECTOMY     PARTIAL HYSTERECTOMY  1974   w 1/2 right ovary     RETINAL LASER PROCEDURE     SIGMOIDOSCOPY     THYROIDECTOMY  1975   rt. benign tumor      OB History   No obstetric history on file.     Family History  Problem Relation Age of Onset   Healthy Mother    Lung cancer Father    Colon cancer Neg Hx    Stomach cancer Neg Hx    Esophageal cancer Neg Hx     Social History   Tobacco Use   Smoking status: Never   Smokeless tobacco: Never  Vaping Use   Vaping Use: Never used  Substance Use Topics   Alcohol use: No   Drug use: No    Home Medications Prior to Admission medications   Medication Sig Start Date End Date Taking? Authorizing Provider  aspirin 81 MG EC tablet Take 81 mg by mouth daily.   Yes [provider]  Calcium Carbonate-Vit D-Min (CALTRATE PLUS PO) Take 1 tablet by mouth daily.   Yes [provider]  Cholecalciferol (VITAMIN D) 2000 UNITS CAPS Take by mouth as directed. Take 1 tablet daily except none on saturdays and sundays   Yes [provider]  denosumab (PROLIA) 60 MG/ML SOSY injection Inject 60 mg into the skin every 6 (six) months.  Yes [provider]  Fe Fum-FePoly-Vit C-Vit B3 (INTEGRA) 62.5-62.5-40-3 MG CAPS TAKE 1 CAPSULE BY MOUTH EVERY DAY 08/02/21  Yes Gatha Mayer, MD  hydrALAZINE (APRESOLINE) 10 MG tablet Take 1 tablet (10 mg total) by mouth every 8 (eight) hours. 10/20/20  Yes Dettinger, Fransisca Kaufmann, MD  ibuprofen (ADVIL) 200 MG tablet Take 200 mg by mouth every 6 (six) hours as needed.   Yes [provider]  Inulin 1.5 g CHEW Chew 5 tablets by mouth daily. FIBER CHOICE   Yes [provider]  metoCLOPramide (REGLAN) 10 MG tablet Take 1 tablet (10 mg total) by mouth at bedtime. 09/14/20  Yes Gatha Mayer, MD  mirabegron ER (MYRBETRIQ) 50 MG TB24 tablet Take 1 tablet (50 mg total) by mouth daily. 10/20/20  Yes Dettinger, Fransisca Kaufmann, MD  Multiple Vitamins-Minerals (CENTRUM SILVER PO) Take 1 tablet by mouth daily.   Yes [provider]   Omega-3 Fatty Acids (FISH OIL) 1000 MG CAPS Take 1 capsule by mouth 3 (three) times daily.    Yes [provider]  omeprazole (PRILOSEC) 20 MG capsule TAKE 1-2 CAPSULES (20-40 MG TOTAL) BY MOUTH DAILY. 08/02/21  Yes Dettinger, Fransisca Kaufmann, MD  rosuvastatin (CRESTOR) 5 MG tablet TAKE 1 TABLET BY MOUTH ON MONDAY, [place], AND FRIDAY Patient taking differently: Take 5 mg by mouth See admin instructions. Take 1 tablet on Monday,Tuesday, and Friday. 06/01/21  Yes Dettinger, Fransisca Kaufmann, MD  amLODipine (NORVASC) 5 MG tablet Take 1 tablet (5 mg total) by mouth daily. 10/20/20   Dettinger, Fransisca Kaufmann, MD  Aspirin-Acetaminophen-Caffeine 520-260-32.5 MG PACK Take by mouth. Patient not taking: Reported on 09/09/2021    [provider]    Allergies    Prevnar 13 [pneumococcal 13-val conj vacc], Penicillins, Tetanus toxoids, Codeine, and Sulfonamide derivatives  Review of Systems   Review of Systems  All other systems reviewed and are negative.  Physical Exam Updated Vital Signs BP (!) 160/75   Pulse 80   Temp 98.1 F (36.7 C) (Oral)   Resp 16   Ht 5' (1.524 m)   Wt 43.1 kg   SpO2 94%   BMI 18.56 kg/m   Physical Exam Vitals and nursing note reviewed.  Constitutional:      General: She is not in acute distress.    Appearance: She is well-developed. She is not ill-appearing, toxic-appearing or diaphoretic.  HENT:     Head: Normocephalic and atraumatic.     Right Ear: External ear normal.     Left Ear: External ear normal.  Eyes:     Conjunctiva/sclera: Conjunctivae normal.     Pupils: Pupils are equal, round, and reactive to light.  Neck:     Trachea: Phonation normal.  Cardiovascular:     Rate and Rhythm: Normal rate and regular rhythm.     Heart sounds: Normal heart sounds.  Pulmonary:     Effort: Pulmonary effort is normal.     Breath sounds: Normal breath sounds.  Abdominal:     General: There is no distension.     Palpations: Abdomen is soft.     Tenderness: There  is no abdominal tenderness.  Musculoskeletal:        General: Normal range of motion.     Cervical back: Normal range of motion and neck supple.  Skin:    General: Skin is warm and dry.  Neurological:     Mental Status: She is alert and oriented to person, place, and time.  Cranial Nerves: No cranial nerve deficit.     Sensory: No sensory deficit.     Motor: No abnormal muscle tone.     Coordination: Coordination normal.     Comments: No dysarthria or aphasia.  No pronator drift.  No ataxia while sitting.  On attempted standing, she requires support to prevent her from falling backwards or to the left.  While standing she was able to independently kick out each leg, while I stabilized her torso.  Psychiatric:        Mood and Affect: Mood normal.        Behavior: Behavior normal.        Thought Content: Thought content normal.        Judgment: Judgment normal.    ED Results / Procedures / Treatments   Labs (all labs ordered are listed, but only abnormal results are displayed) Labs Reviewed  URINALYSIS, ROUTINE W REFLEX MICROSCOPIC - Abnormal; Notable for the following components:      Result Value   APPearance HAZY (*)    Hgb urine dipstick SMALL (*)    Ketones, ur 20 (*)    Leukocytes,Ua TRACE (*)    Bacteria, UA RARE (*)    All other components within normal limits  COMPREHENSIVE METABOLIC PANEL - Abnormal; Notable for the following components:   Sodium 132 (*)    Chloride 96 (*)    Glucose, Bld 100 (*)    Alkaline Phosphatase 35 (*)    All other components within normal limits  CBC WITH DIFFERENTIAL/PLATELET - Abnormal; Notable for the following components:   Monocytes Absolute 1.1 (*)    All other components within normal limits  RESP PANEL BY RT-PCR (FLU A&B, COVID) [id number]    EKG None  Radiology CT Head Wo Contrast  Result Date: 09/09/2021 CLINICAL DATA:  Generalized weakness EXAM: CT HEAD WITHOUT CONTRAST TECHNIQUE: Contiguous axial images were obtained from  the base of the skull through the vertex without intravenous contrast. COMPARISON:  CT brain 10/26/2017 FINDINGS: Brain: No acute territorial infarction, hemorrhage or intracranial mass. Advanced atrophy. Cerebellar atrophy. Moderate chronic small vessel ischemic changes of the white matter. Stable ventricle size. Vascular: No hyperdense vessels.  Carotid vascular calcification. Skull: Normal. Negative for fracture or focal lesion. Sinuses/Orbits: No acute finding. Other: Small hyperdense scalp lesion at the right anterior vertex may represent cutaneous cyst IMPRESSION: 1. No definite CT evidence for acute intracranial abnormality. 2. Atrophy and chronic small vessel ischemic changes of the white matter Electronically Signed   By: Donavan Foil M.D.   On: 09/09/2021 23:11    Procedures Procedures   Medications Ordered in ED Medications - No data to display  ED Course  I have reviewed the triage vital signs and the nursing notes.  Pertinent labs & imaging results that were available during my care of the patient were reviewed by me and considered in my medical decision making (see chart for details).    MDM Rules/Calculators/A&P                            Patient Vitals for the past 24 hrs:  BP Temp Temp src Pulse Resp SpO2 Height Weight  09/09/21 2235 (!) 160/75 -- -- 80 16 94 % -- --  09/09/21 2100 (!) 166/71 -- -- 92 16 95 % -- --  09/09/21 2010 (!) 146/69 -- -- 86 16 97 % -- --  09/09/21 2000 (!) 144/69 -- -- 88 16  96 % -- --  09/09/21 1838 -- 98.1 F (36.7 C) Oral -- -- -- 5' (1.524 m) 43.1 kg    11:34 PM Reevaluation with update and discussion. After initial assessment and treatment, an updated evaluation reveals no change in clinical status, findings discussed with the patient and all questions were answered. Daleen Bo   Medical Decision Making:  This patient is presenting for evaluation of balance problems since awakening this morning, which does require a range of  treatment options, and is a complaint that involves a moderate risk of morbidity and mortality. The differential diagnoses include CVA, peripheral neuropathy, metabolic disorder, malaise. I decided to review old records, and in summary elderly female with history of GERD, nonspecific weakness, hypertension and neuropathy.  I obtained additional historical information from a friend who is also her POA.  Clinical Laboratory Tests Ordered, included CBC, Metabolic panel, and viral panel . Review indicates normal except sodium low, chloride low, glucose high, urine with nonspecific findings. Radiologic Tests Ordered, included CT head.  I independently Visualized: Radiograph images, which show no acute abnormalities    Critical Interventions-clinical evaluation, laboratory testing, CT imaging, observation and reassessment  After These Interventions, the Patient was reevaluated and was found with reassuring initial evaluation.  CT does not show sign of stroke.  No significant metabolic disorders.  Unable to obtain MRI imaging, at this facility at this time.  Hospitalist consulted for hospitalization for observation and expectant management with MRI imaging in the morning.  Concerned about a posterior circulation stroke causing her gait ataxia.  No ataxia while sitting.  She requires further evaluation before disposition.  CRITICAL CARE-no Performed by: Daleen Bo  Nursing Notes Reviewed/ Care Coordinated Applicable Imaging Reviewed Interpretation of Laboratory Data incorporated into ED treatment   11:38 PM-Consult complete with hospitalist. Patient case explained and discussed.  She agrees to admit patient for further evaluation and treatment. Call ended at 1145    Final Clinical Impression(s) / ED Diagnoses Final diagnoses:  Ataxic gait    Rx / DC Orders ED Discharge Orders     None        Daleen Bo, MD 09/09/21 2346

## 2021-09-09 NOTE — ED Triage Notes (Signed)
Pt to the ED [facility] from home with weakness that began today.  Pt has had a head cold for the last 3 days.  Stroke screen is negative.

## 2021-09-09 NOTE — ED Notes (Signed)
ED Provider at bedside. 

## 2021-09-09 NOTE — ED Notes (Signed)
Pt reports taking Motrin at 1700 today PTA. Pt reports generalized weakness, and difficulty walking around at home. Pt denies any recent falls. Pt  A+O X 4, and has equal strength in all 4 extremities.

## 2021-09-10 DIAGNOSIS — N390 Urinary tract infection, site not specified: Secondary | ICD-10-CM | POA: Insufficient documentation

## 2021-09-10 DIAGNOSIS — E785 Hyperlipidemia, unspecified: Secondary | ICD-10-CM

## 2021-09-10 DIAGNOSIS — R531 Weakness: Secondary | ICD-10-CM | POA: Diagnosis not present

## 2021-09-10 DIAGNOSIS — R5381 Other malaise: Secondary | ICD-10-CM | POA: Insufficient documentation

## 2021-09-10 DIAGNOSIS — R260 Ataxic gait: Secondary | ICD-10-CM | POA: Insufficient documentation

## 2021-09-10 DIAGNOSIS — I10 Essential (primary) hypertension: Secondary | ICD-10-CM

## 2021-09-10 LAB — CBC WITH DIFFERENTIAL/PLATELET
Abs Immature Granulocytes: 0.03 10*3/uL (ref 0.00–0.07)
Basophils Absolute: 0 10*3/uL (ref 0.0–0.1)
Basophils Relative: 0 %
Eosinophils Absolute: 0.1 10*3/uL (ref 0.0–0.5)
Eosinophils Relative: 1 %
HCT: 39.6 % (ref 36.0–46.0)
Hemoglobin: 13.3 g/dL (ref 12.0–15.0)
Immature Granulocytes: 0 %
Lymphocytes Relative: 28 %
Lymphs Abs: 2.7 10*3/uL (ref 0.7–4.0)
MCH: 31.6 pg (ref 26.0–34.0)
MCHC: 33.6 g/dL (ref 30.0–36.0)
MCV: 94.1 fL (ref 80.0–100.0)
Monocytes Absolute: 1.3 10*3/uL — ABNORMAL HIGH (ref 0.1–1.0)
Monocytes Relative: 14 %
Neutro Abs: 5.5 10*3/uL (ref 1.7–7.7)
Neutrophils Relative %: 57 %
Platelets: 217 10*3/uL (ref 150–400)
RBC: 4.21 MIL/uL (ref 3.87–5.11)
RDW: 12.3 % (ref 11.5–15.5)
WBC: 9.6 10*3/uL (ref 4.0–10.5)
nRBC: 0 % (ref 0.0–0.2)

## 2021-09-10 LAB — COMPREHENSIVE METABOLIC PANEL
ALT: 20 U/L (ref 0–44)
AST: 20 U/L (ref 15–41)
Albumin: 3.6 g/dL (ref 3.5–5.0)
Alkaline Phosphatase: 31 U/L — ABNORMAL LOW (ref 38–126)
Anion gap: 9 (ref 5–15)
BUN: 8 mg/dL (ref 8–23)
CO2: 28 mmol/L (ref 22–32)
Calcium: 9 mg/dL (ref 8.9–10.3)
Chloride: 101 mmol/L (ref 98–111)
Creatinine, Ser: 0.51 mg/dL (ref 0.44–1.00)
GFR, Estimated: >60 mL/min (ref >60)
Glucose, Bld: 100 mg/dL — ABNORMAL HIGH (ref 70–99)
Potassium: 3.5 mmol/L (ref 3.5–5.1)
Sodium: 138 mmol/L (ref 135–145)
Total Bilirubin: 0.2 mg/dL — ABNORMAL LOW (ref 0.3–1.2)
Total Protein: 6.3 g/dL — ABNORMAL LOW (ref 6.5–8.1)

## 2021-09-10 LAB — MAGNESIUM: Magnesium: 2.1 mg/dL (ref 1.7–2.4)

## 2021-09-10 LAB — TSH: TSH: 3.207 u[IU]/mL (ref 0.350–4.500)

## 2021-09-10 LAB — VITAMIN B12: Vitamin B-12: 1374 pg/mL — ABNORMAL HIGH (ref 180–914)

## 2021-09-10 MED ORDER — MIRABEGRON ER 25 MG PO TB24
50.0000 mg | ORAL_TABLET | Freq: Every day | ORAL | Status: DC
  Administered 2021-09-10: 50 mg via ORAL
  Filled 2021-09-10: qty 2

## 2021-09-10 MED ORDER — HYDRALAZINE HCL 10 MG PO TABS
10.0000 mg | ORAL_TABLET | Freq: Three times a day (TID) | ORAL | Status: DC
  Administered 2021-09-10 (×2): 10 mg via ORAL
  Filled 2021-09-10 (×2): qty 1

## 2021-09-10 MED ORDER — ONDANSETRON HCL 4 MG PO TABS: 4.0000 mg | ORAL_TABLET | Freq: Four times a day (QID) | ORAL | Status: DC | PRN

## 2021-09-10 MED ORDER — ACETAMINOPHEN 325 MG PO TABS: 650.0000 mg | ORAL_TABLET | Freq: Four times a day (QID) | ORAL | Status: DC | PRN

## 2021-09-10 MED ORDER — ASPIRIN EC 81 MG PO TBEC
81.0000 mg | DELAYED_RELEASE_TABLET | Freq: Every day | ORAL | Status: DC
  Administered 2021-09-10: 81 mg via ORAL
  Filled 2021-09-10: qty 1

## 2021-09-10 MED ORDER — OXYCODONE HCL 5 MG PO TABS: 5.0000 mg | ORAL_TABLET | ORAL | Status: DC | PRN

## 2021-09-10 MED ORDER — TRAZODONE HCL 50 MG PO TABS: 50.0000 mg | ORAL_TABLET | Freq: Every evening | ORAL | Status: DC | PRN

## 2021-09-10 MED ORDER — HEPARIN SODIUM (PORCINE) 5000 UNIT/ML IJ SOLN
5000.0000 [IU] | Freq: Three times a day (TID) | INTRAMUSCULAR | Status: DC
  Administered 2021-09-10 (×2): 5000 [IU] via SUBCUTANEOUS
  Filled 2021-09-10 (×2): qty 1

## 2021-09-10 MED ORDER — AMLODIPINE BESYLATE 5 MG PO TABS
5.0000 mg | ORAL_TABLET | Freq: Every day | ORAL | Status: DC
  Administered 2021-09-10: 5 mg via ORAL
  Filled 2021-09-10: qty 1

## 2021-09-10 MED ORDER — CEFDINIR 300 MG PO CAPS: 300.0000 mg | ORAL_CAPSULE | Freq: Two times a day (BID) | ORAL | 0 refills | Status: AC

## 2021-09-10 MED ORDER — MECLIZINE HCL 12.5 MG PO TABS: 12.5000 mg | ORAL_TABLET | Freq: Three times a day (TID) | ORAL | 0 refills | Status: DC | PRN

## 2021-09-10 MED ORDER — ACETAMINOPHEN 650 MG RE SUPP: 650.0000 mg | Freq: Four times a day (QID) | RECTAL | Status: DC | PRN

## 2021-09-10 MED ORDER — ROSUVASTATIN CALCIUM 5 MG PO TABS: 5.0000 mg | ORAL_TABLET | ORAL | Status: DC

## 2021-09-10 MED ORDER — ONDANSETRON HCL 4 MG/2ML IJ SOLN: 4.0000 mg | Freq: Four times a day (QID) | INTRAMUSCULAR | Status: DC | PRN

## 2021-09-10 NOTE — H&P (Addendum)
History and Physical  [facility]  [name] CHY:[id number] DOB: [birth year] DOA: 09/09/2021  PCP: Dettinger, Fransisca Kaufmann, MD  Patient coming from: Home Level of care: Telemetry    Chief Complaint: Generalized weakness of the lower extremities  HPI: Kristi Sawyer is a 87 y.o. female with medical history significant hypertension, hyperlipidemia, celiac disease and osteoporosis who presents to the emergency department from home with a 1 day history of weakness in the lower extremities and instability.  Patient lives alone.  she states that she noticed weakness and instability when walking when she woke this morning at 8:00 AM.  Patient does state that she uses the bathroom frequently, about once an hour; this is normal for her.  She states that she walked to the bathroom twice during the night at 2 and 4; she did not notice any weakness at that time.  Patient does report recently feeling sick.  She states that she started feeling this way after getting her flu shot on Friday.  Patient does report a previous event similar to this 2 years ago where she was diagnosed with dehydration.  Patient endorses congestion, wheeze, cough and runny nose.  Patient denies other neurologic deficits, dizziness, headache, chest pains, shortness of breath, changes in urinary frequency or urgency.  Urinalysis showed trace leukocytes and rare bacteria.  Head CT showed no acute abnormalities.  Patient is vaccinated against SARS COVID-19.   ED Course: In the ED all the imaging studies were negative.  Urinalysis and urine culture were collected.  Review of Systems: Review of Systems  Constitutional: Negative.   HENT: Negative.    Eyes: Negative.   Respiratory:  Positive for cough and wheezing. Negative for sputum production and shortness of breath.   Cardiovascular:  Negative for chest pain, palpitations and leg swelling.  Gastrointestinal: Negative.   Genitourinary: Negative.   Musculoskeletal: Negative.    Skin: Negative.   Neurological:  Positive for weakness. Negative for dizziness, sensory change, speech change, seizures, loss of consciousness and headaches.  Endo/Heme/Allergies: Negative.   Psychiatric/Behavioral: Negative.      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 benign tumor      reports that she has never smoked. She has never used smokeless tobacco. She reports that she does not drink alcohol and does not use drugs.  Allergies  Allergen Reactions   Prevnar 13 [Pneumococcal 13-Val Conj Vacc] Hives    Red/streaking arm   Penicillins Hives  Has patient had a PCN reaction causing immediate rash, facial/tongue/throat swelling, SOB or lightheadedness with hypotension: No Has patient had a PCN reaction causing severe rash involving mucus membranes or skin necrosis: No Has patient had a PCN reaction that required hospitalization: No Has patient had a PCN reaction occurring within the last 10 years: No If all of the above answers are "NO", then may proceed with Cephalosporin use.    Tetanus Toxoids Other (See Comments)    Area of injection was red and streaked.   Codeine Nausea Only   Sulfonamide Derivatives Nausea Only     Family History  Problem Relation Age of Onset   Healthy Mother    Lung cancer Father    Colon cancer Neg Hx    Stomach cancer Neg Hx    Esophageal cancer Neg Hx     Prior to Admission medications   Medication Sig Start Date End Date Taking? Authorizing Provider  aspirin 81 MG EC tablet Take 81 mg by mouth daily.   Yes [provider]  Calcium Carbonate-Vit D-Min (CALTRATE PLUS PO) Take 1 tablet by mouth daily.   Yes [provider]  Cholecalciferol (VITAMIN D) 2000 UNITS CAPS Take by mouth as directed. Take 1 tablet daily except none on saturdays and sundays   Yes [provider]  denosumab (PROLIA) 60 MG/ML SOSY injection Inject 60 mg into the skin every 6 (six) months.   Yes [provider]  Fe Fum-FePoly-Vit C-Vit B3 (INTEGRA) 62.5-62.5-40-3 MG CAPS TAKE 1 CAPSULE BY MOUTH EVERY DAY 08/02/21  Yes Gatha Mayer, MD  hydrALAZINE (APRESOLINE) 10 MG tablet Take 1 tablet (10 mg total) by mouth every 8 (eight) hours. 10/20/20  Yes Dettinger, Fransisca Kaufmann, MD  ibuprofen (ADVIL) 200 MG tablet Take 200 mg by mouth every 6 (six) hours as needed.   Yes [provider]  Inulin 1.5 g CHEW Chew 5 tablets by mouth daily. FIBER CHOICE   Yes [provider]  metoCLOPramide (REGLAN) 10 MG tablet Take 1 tablet (10 mg total) by mouth at bedtime. 09/14/20  Yes Gatha Mayer, MD  mirabegron ER (MYRBETRIQ) 50 MG TB24 tablet Take 1 tablet (50 mg total) by mouth daily. 10/20/20  Yes Dettinger, Fransisca Kaufmann, MD  Multiple Vitamins-Minerals (CENTRUM SILVER PO) Take 1 tablet by mouth daily.   Yes [provider]  Omega-3 Fatty Acids (FISH OIL) 1000 MG CAPS Take 1 capsule by mouth 3 (three) times daily.    Yes [provider]  omeprazole (PRILOSEC) 20 MG capsule TAKE 1-2 CAPSULES (20-40 MG TOTAL) BY MOUTH DAILY. 08/02/21  Yes Dettinger, Fransisca Kaufmann, MD  rosuvastatin (CRESTOR) 5 MG tablet TAKE 1 TABLET BY MOUTH ON MONDAY, [place], AND FRIDAY Patient  taking differently: Take 5 mg by mouth See admin instructions. Take 1 tablet on Monday,Tuesday, and Friday. 06/01/21  Yes Dettinger, Fransisca Kaufmann, MD  amLODipine (NORVASC) 5 MG tablet Take 1 tablet (5 mg total) by mouth daily. 10/20/20   Dettinger, Fransisca Kaufmann, MD  Aspirin-Acetaminophen-Caffeine 520-260-32.5 MG PACK Take by mouth. Patient not taking: Reported on 09/09/2021    [provider]    Physical Exam: Vitals:   09/09/21 2000 09/09/21 2010 09/09/21 2100 09/09/21 2235  BP: (!) 144/69 (!) 146/69 (!) 166/71 (!) 160/75  Pulse: 88 86 92 80  Resp: 16 16 16 16   Temp:      TempSrc:      SpO2: 96% 97% 95% 94%  Weight:      Height:  Constitutional: NAD, calm, comfortable Eyes: PERRL, lids and conjunctivae normal ENMT: Mucous membranes are moist. Posterior pharynx clear of any exudate or lesions.Normal dentition.  Neck: normal, supple, no masses, no thyromegaly Respiratory: Expiratory wheeze present, no crackles. Normal respiratory effort. No accessory muscle use.  Cardiovascular: normal s1, s2 sounds, no murmurs / rubs / gallops. No extremity edema. 2+ pedal pulses. No carotid bruits.  Abdomen: no tenderness, no masses palpated. No hepatosplenomegaly. Bowel sounds positive.  Musculoskeletal: no clubbing / cyanosis. No joint deformity upper and lower extremities. Good ROM, no contractures. Normal muscle tone.  Skin: no rashes, lesions, ulcers. No induration Neurologic: CN 2-12 grossly intact. Sensation intact, DTR normal. Strength 5/5 in all 4.  Psychiatric: Normal judgment and insight. Alert and oriented x 3. Normal mood.   Labs on Admission: I have personally reviewed following labs and imaging studies  CBC: Recent Labs  Lab 09/09/21 2238  WBC 10.5  NEUTROABS 6.9  HGB 12.9  HCT 38.1  MCV 92.5  PLT 144   Basic Metabolic Panel: Recent Labs  Lab 09/09/21 2238  NA 132*  K 3.5  CL 96*  CO2 27  GLUCOSE 100*  BUN 11  CREATININE 0.56  CALCIUM 8.9   GFR: Estimated  Creatinine Clearance: 30.5 mL/min (by C-G formula based on SCr of 0.56 mg/dL). Liver Function Tests: Recent Labs  Lab 09/09/21 2238  AST 20  ALT 19  ALKPHOS 35*  BILITOT 0.4  PROT 6.5  ALBUMIN 3.7   No results for input(s): LIPASE, AMYLASE in the last 168 hours. No results for input(s): AMMONIA in the last 168 hours. Coagulation Profile: No results for input(s): INR, PROTIME in the last 168 hours. Cardiac Enzymes: No results for input(s): CKTOTAL, CKMB, CKMBINDEX, TROPONINI in the last 168 hours. BNP (last 3 results) No results for input(s): PROBNP in the last 8760 hours. HbA1C: No results for input(s): HGBA1C in the last 72 hours. CBG: No results for input(s): GLUCAP in the last 168 hours. Lipid Profile: No results for input(s): CHOL, HDL, LDLCALC, TRIG, CHOLHDL, LDLDIRECT in the last 72 hours. Thyroid Function Tests: No results for input(s): TSH, T4TOTAL, FREET4, T3FREE, THYROIDAB in the last 72 hours. Anemia Panel: No results for input(s): VITAMINB12, FOLATE, FERRITIN, TIBC, IRON, RETICCTPCT in the last 72 hours. Urine analysis:    Component Value Date/Time   COLORURINE YELLOW 09/09/2021 2012   APPEARANCEUR HAZY (A) 09/09/2021 2012   LABSPEC 1.006 09/09/2021 2012   PHURINE 7.0 09/09/2021 2012   GLUCOSEU NEGATIVE 09/09/2021 2012   HGBUR SMALL (A) 09/09/2021 2012   BILIRUBINUR NEGATIVE 09/09/2021 2012   BILIRUBINUR neg 07/07/2014 1033   KETONESUR 20 (A) 09/09/2021 2012   PROTEINUR NEGATIVE 09/09/2021 2012   UROBILINOGEN negative 07/07/2014 1033   NITRITE NEGATIVE 09/09/2021 2012   LEUKOCYTESUR TRACE (A) 09/09/2021 2012    Radiological Exams on Admission: CT Head Wo Contrast  Result Date: 09/09/2021 CLINICAL DATA:  Generalized weakness EXAM: CT HEAD WITHOUT CONTRAST TECHNIQUE: Contiguous axial images were obtained from the base of the skull through the vertex without intravenous contrast. COMPARISON:  CT brain 10/26/2017 FINDINGS: Brain: No acute territorial  infarction, hemorrhage or intracranial mass. Advanced atrophy. Cerebellar atrophy. Moderate chronic small vessel ischemic changes of the white matter. Stable ventricle size. Vascular: No hyperdense vessels.  Carotid vascular calcification. Skull: Normal. Negative for fracture or focal lesion. Sinuses/Orbits: No acute finding. Other: Small hyperdense scalp lesion at the right anterior vertex may represent cutaneous cyst IMPRESSION: 1. No definite CT evidence for acute intracranial  abnormality. 2. Atrophy and chronic small vessel ischemic changes of the white matter Electronically Signed   By: Donavan Foil M.D.   On: 09/09/2021 23:11    EKG: EKG pending  Assessment/Plan Active Problems:   Generalized weakness     1) Generalized weakness -CT showing no acute abnormalities -MRI of the brain ordered for the morning -TSH ordered   2) Possible UTI - Urinalysis showed trace leukocytes and rare bacteria - Started on ceftriaxone - Urine cultures pending   3) Dehydration -NS 500 mL bolus -Encourage p.o. water intake   4) Hypertension -Continue home amlodipine 5 mg  -Continue home hydroxyzine 10 mg -Continue home aspirin 81 mg   5) Hyperlipidemia -Continue home rosuvastatin 5 mg  6) GERD -Continue home omeprazole 20 mg  DVT prophylaxis: Heparin Code Status: Full code Family Communication: No family at bedside Consults called: N/A Admission status: Inpatient Level of care: Telemetry Valentino Nose Sweet MS4  09/10/2021, 12:31 AM

## 2021-09-10 NOTE — Clinical Social Work Note (Addendum)
CSW notified of patient's readiness for discharge and DME needs. CSW ordered  RW with [employer]. [name] with [employer] agreeable to provide RW. Patient agreeable to [facility] referral CSW referred patient to [facility]. Georgina Snell with [facility] agreeable to provide [facility] services.

## 2021-09-10 NOTE — ED Notes (Signed)
Pt given d/c paperwork, reviewed, no questions at this time. Pt to waiting room by wheelchair awaiting ride home.

## 2021-09-10 NOTE — Discharge Summary (Signed)
Physician Discharge Summary  Kristi Sawyer CVE:[id number] DOB: June 16, 1926 DOA: 09/09/2021  PCP: Dettinger, Fransisca Kaufmann, MD  Admit date: 09/09/2021 Discharge date: 09/10/2021  Time spent: 35 minutes  Recommendations for Outpatient Follow-up:  Reassess blood pressure and further adjust antihypertensive regimen Please follow final results of patient's urine culture and make any adjustment to her antibiotics if needed. Repeat basic metabolic panel to follow twice and renal function.  Discharge Diagnoses:  Active Problems:   Generalized weakness Acute lower UTI Primary hypertension Gastroesophageal flux disease Hyperlipidemia Dehydration/hyponatremia   Discharge Condition: Stable and improved.  Discharged home with instruction to follow-up with PCP in 10 days.  CODE STATUS: Full code  Diet recommendation: Heart healthy diet.  Filed Weights   09/09/21 1838  Weight: 43.1 kg    History of present illness:  As per H&P written by Dr. Marisue Humble Kristi Sawyer is a 87 y.o. female with medical history significant hypertension, hyperlipidemia, celiac disease and osteoporosis who presents to the emergency department from home with a 1 day history of weakness in the lower extremities and instability.  Patient lives alone.  she states that she noticed weakness and instability when walking when she woke this morning at 8:00 AM.  Patient does state that she uses the bathroom frequently, about once an hour; this is normal for her.  She states that she walked to the bathroom twice during the night at 2 and 4; she did not notice any weakness at that time.  Patient does report recently feeling sick.  She states that she started feeling this way after getting her flu shot on Friday.  Patient does report a previous event similar to this 2 years ago where she was diagnosed with dehydration.  Patient endorses congestion, wheeze, cough and runny nose.  Patient denies other neurologic deficits, dizziness,  headache, chest pains, shortness of breath, changes in urinary frequency or urgency.  Urinalysis showed trace leukocytes and rare bacteria.  Head CT showed no acute abnormalities.  Patient is vaccinated against SARS COVID-19.    ED Course: In the ED all the imaging studies were negative.  Urinalysis and urine culture were collected.  Hospital Course:  1-generalized weakness -Presumably secondary to dehydration and acute lower UTI. -CT head negative for acute intracranial problems -After fluid resuscitation and electrolyte stabilization patient was asymptomatic and felt safe to return home. -Home health physical therapy and a rolling walker has been arranged at time of discharge.  2-presumed acute lower UTI -Patient reports some increased frequency but denies dysuria -Urinalysis suggesting infection -Final cultures pending at time of discharge; patient likes the idea of continue empirical therapy and to make further adjustment based on final results. -Advised to maintain adequate hydration.  3-dehydration/hyponatremia -Fluid resuscitation has been provided -Discussed with patient importance of adequate fluid intake. -Electrolytes within normal limits at discharge; repeat basic metabolic panel follow-up visit to assess stability.  4-hyperlipidemia -Continue statins.  5-primary hypertension -Continue the use of amlodipine and hydralazine -Heart healthy diet has been encouraged.  6-gastroesophageal flux disease -Continue PPI.  Procedures: See below for x-ray reports.  Consultations: None  Discharge Exam: Vitals:   09/10/21 1415 09/10/21 1500  BP:  131/77  Pulse: 73   Resp: 16   Temp:    SpO2: 95%     General: Afebrile, no lightheadedness, no chest pain, no nausea, no vomiting, no shortness of breath.  Good participation with physical therapy requiring minimal assistance and found to be safe to go home with home health services. Cardiovascular:  S1 and S2, no rubs, no  gallops, no JVD. Respiratory: Clear to auscultation bilaterally; no using accessory muscles. Abdomen: Soft, nontender, positive bowel sounds Extremities: No cyanosis or clubbing.  Discharge Instructions   Discharge Instructions     Diet - low sodium heart healthy   Complete by: As directed    Discharge instructions   Complete by: As directed    Take medications as prescribed Maintain adequate hydration Arrange follow-up with PCP in 10 days. Follow instructions on recommendation by home health physical therapy to improved conditioning and safety.   Increase activity slowly   Complete by: As directed       Allergies as of 09/10/2021       Reactions   Prevnar 13 [pneumococcal 13-val Conj Vacc] Hives   Red/streaking arm   Penicillins Hives   Has patient had a PCN reaction causing immediate rash, facial/tongue/throat swelling, SOB or lightheadedness with hypotension: No Has patient had a PCN reaction causing severe rash involving mucus membranes or skin necrosis: No Has patient had a PCN reaction that required hospitalization: No Has patient had a PCN reaction occurring within the last 10 years: No If all of the above answers are "NO", then may proceed with Cephalosporin use.   Tetanus Toxoids Other (See Comments)   Area of injection was red and streaked.   Codeine Nausea Only   Sulfonamide Derivatives Nausea Only        Medication List     STOP taking these medications    Aspirin-Acetaminophen-Caffeine 520-260-32.5 MG Pack   denosumab 60 MG/ML Sosy injection Commonly known as: PROLIA   ibuprofen 200 MG tablet Commonly known as: ADVIL       TAKE these medications    amLODipine 5 MG tablet Commonly known as: NORVASC Take 1 tablet (5 mg total) by mouth daily.   aspirin 81 MG EC tablet Take 81 mg by mouth daily.   CALTRATE PLUS PO Take 1 tablet by mouth daily.   cefdinir 300 MG capsule Commonly known as: OMNICEF Take 1 capsule (300 mg total) by mouth 2  (two) times daily for 4 days.   CENTRUM SILVER PO Take 1 tablet by mouth daily.   Fish Oil 1000 MG Caps Take 1 capsule by mouth 3 (three) times daily.   hydrALAZINE 10 MG tablet Commonly known as: APRESOLINE Take 1 tablet (10 mg total) by mouth every 8 (eight) hours.   Integra 62.5-62.5-40-3 MG Caps TAKE 1 CAPSULE BY MOUTH EVERY DAY   Inulin 1.5 g Chew Chew 5 tablets by mouth daily. FIBER CHOICE   meclizine 12.5 MG tablet Commonly known as: ANTIVERT Take 1 tablet (12.5 mg total) by mouth 3 (three) times daily as needed for dizziness.   metoCLOPramide 10 MG tablet Commonly known as: REGLAN Take 1 tablet (10 mg total) by mouth at bedtime.   mirabegron ER 50 MG Tb24 tablet Commonly known as: Myrbetriq Take 1 tablet (50 mg total) by mouth daily.   omeprazole 20 MG capsule Commonly known as: PRILOSEC TAKE 1-2 CAPSULES (20-40 MG TOTAL) BY MOUTH DAILY.   rosuvastatin 5 MG tablet Commonly known as: CRESTOR TAKE 1 TABLET BY MOUTH ON MONDAY, WEDNESDAY, AND FRIDAY What changed: See the new instructions.   Vitamin D 50 MCG (2000 UT) Caps Take by mouth as directed. Take 1 tablet daily except none on saturdays and sundays               Durable Medical Equipment  (From admission, onward)  Start     Ordered   09/10/21 1510  For home use only DME Walker rolling  Once       Question Answer Comment  Walker: With 5 Inch Wheels   Patient needs a walker to treat with the following condition Poor balance      09/10/21 1509           Allergies  Allergen Reactions   Prevnar 13 [Pneumococcal 13-Val Conj Vacc] Hives    Red/streaking arm   Penicillins Hives    Has patient had a PCN reaction causing immediate rash, facial/tongue/throat swelling, SOB or lightheadedness with hypotension: No Has patient had a PCN reaction causing severe rash involving mucus membranes or skin necrosis: No Has patient had a PCN reaction that required hospitalization: No Has  patient had a PCN reaction occurring within the last 10 years: No If all of the above answers are "NO", then may proceed with Cephalosporin use.    Tetanus Toxoids Other (See Comments)    Area of injection was red and streaked.   Codeine Nausea Only   Sulfonamide Derivatives Nausea Only    Follow-up Information     Dettinger, Fransisca Kaufmann, MD. Schedule an appointment as soon as possible for a visit in 10 day(s).   Specialties: Family Medicine, Cardiology Contact information: [place] [address] [phone]                 The results of significant diagnostics from this hospitalization (including imaging, microbiology, ancillary and laboratory) are listed below for reference.    Significant Diagnostic Studies: CT Head Wo Contrast  Result Date: 09/09/2021 CLINICAL DATA:  Generalized weakness EXAM: CT HEAD WITHOUT CONTRAST TECHNIQUE: Contiguous axial images were obtained from the base of the skull through the vertex without intravenous contrast. COMPARISON:  CT brain 10/26/2017 FINDINGS: Brain: No acute territorial infarction, hemorrhage or intracranial mass. Advanced atrophy. Cerebellar atrophy. Moderate chronic small vessel ischemic changes of the white matter. Stable ventricle size. Vascular: No hyperdense vessels.  Carotid vascular calcification. Skull: Normal. Negative for fracture or focal lesion. Sinuses/Orbits: No acute finding. Other: Small hyperdense scalp lesion at the right anterior vertex may represent cutaneous cyst IMPRESSION: 1. No definite CT evidence for acute intracranial abnormality. 2. Atrophy and chronic small vessel ischemic changes of the white matter Electronically Signed   By: Donavan Foil M.D.   On: 09/09/2021 23:11    Microbiology: Recent Results (from the past 240 hour(s))  Resp Panel by RT-PCR (Flu A&B, Covid) Nasopharyngeal Swab     Status: None   Collection Time: 09/09/21 10:30 PM   Specimen: Nasopharyngeal Swab; Nasopharyngeal(NP)  swabs in vial transport medium  Result Value Ref Range Status   SARS Coronavirus 2 by RT PCR NEGATIVE NEGATIVE Final    Comment: (NOTE) SARS-CoV-2 target nucleic acids are NOT DETECTED.  The SARS-CoV-2 RNA is generally detectable in upper respiratory specimens during the acute phase of infection. The lowest concentration of SARS-CoV-2 viral copies this assay can detect is 138 copies/mL. A negative result does not preclude SARS-Cov-2 infection and should not be used as the sole basis for treatment or other patient management decisions. A negative result may occur with  improper specimen collection/handling, submission of specimen other than nasopharyngeal swab, presence of viral mutation(s) within the areas targeted by this assay, and inadequate number of viral copies(<138 copies/mL). A negative result must be combined with clinical observations, patient history, and epidemiological information. The expected result is Negative.  Fact Sheet for  Patients:  EntrepreneurPulse.com.au  Fact Sheet for Healthcare Providers:  IncredibleEmployment.be  This test is no t yet approved or cleared by the [place] FDA and  has been authorized for detection and/or diagnosis of SARS-CoV-2 by FDA under an Emergency Use Authorization (EUA). This EUA will remain  in effect (meaning this test can be used) for the duration of the COVID-19 declaration under Section 564(b)(1) of the Act, 21 U.S.C.section 360bbb-3(b)(1), unless the authorization is terminated  or revoked sooner.       Influenza A by PCR NEGATIVE NEGATIVE Final   Influenza B by PCR NEGATIVE NEGATIVE Final    Comment: (NOTE) The Xpert Xpress SARS-CoV-2/FLU/RSV plus assay is intended as an aid in the diagnosis of influenza from Nasopharyngeal swab specimens and should not be used as a sole basis for treatment. Nasal washings and aspirates are unacceptable for Xpert Xpress  SARS-CoV-2/FLU/RSV testing.  Fact Sheet for Patients: EntrepreneurPulse.com.au  Fact Sheet for Healthcare Providers: IncredibleEmployment.be  This test is not yet approved or cleared by the [place] FDA and has been authorized for detection and/or diagnosis of SARS-CoV-2 by FDA under an Emergency Use Authorization (EUA). This EUA will remain in effect (meaning this test can be used) for the duration of the COVID-19 declaration under Section 564(b)(1) of the Act, 21 U.S.C. section 360bbb-3(b)(1), unless the authorization is terminated or revoked.  Performed at [facility], [address] [place], [place] [address]      Labs: Basic Metabolic Panel: Recent Labs  Lab 09/09/21 2238 09/10/21 0322  NA 132* 138  K 3.5 3.5  CL 96* 101  CO2 27 28  GLUCOSE 100* 100*  BUN 11 8  CREATININE 0.56 0.51  CALCIUM 8.9 9.0  MG  --  2.1   Liver Function Tests: Recent Labs  Lab 09/09/21 2238 09/10/21 0322  AST 20 20  ALT 19 20  ALKPHOS 35* 31*  BILITOT 0.4 0.2*  PROT 6.5 6.3*  ALBUMIN 3.7 3.6   CBC: Recent Labs  Lab 09/09/21 2238 09/10/21 0322  WBC 10.5 9.6  NEUTROABS 6.9 5.5  HGB 12.9 13.3  HCT 38.1 39.6  MCV 92.5 94.1  PLT 214 217   Signed:  Barton Dubois MD.  [facility] 09/10/2021, 3:23 PM

## 2021-09-10 NOTE — Evaluation (Signed)
Physical Therapy Evaluation Patient Details Name: Kristi Sawyer MRN: [id number] DOB: [birth year] Today's Date: 09/10/2021  History of Present Illness  Kristi Sawyer is a 87 y.o. female with medical history significant hypertension, hyperlipidemia, celiac disease and osteoporosis who presents to the emergency department from home with a 1 day history of weakness in the lower extremities and instability.  Patient lives alone.  she states that she noticed weakness and instability when walking when she woke this morning at 8:00 AM.  Patient does state that she uses the bathroom frequently, about once an hour; this is normal for her.  She states that she walked to the bathroom twice during the night at 2 and 4; she did not notice any weakness at that time.  Patient does report recently feeling sick.  She states that she started feeling this way after getting her flu shot on Friday.  Patient does report a previous event similar to this 2 years ago where she was diagnosed with dehydration.  Patient endorses congestion, wheeze, cough and runny nose.  Patient denies other neurologic deficits, dizziness, headache, chest pains, shortness of breath, changes in urinary frequency or urgency.  Urinalysis showed trace leukocytes and rare bacteria.  Head CT showed no acute abnormalities.  Patient is vaccinated against SARS COVID-19.   Clinical Impression     Patient sitting up in bed and eager to ambulate. Able to perform all bed mobilities, transfers and walking with modified independence. Patient with house shoes without backing which may have modified patents gait as she was walking with a slight march as if too keep her shoes on her feet. Recommended patient obtain pair of shoes with backing for safety and improved gait mechanics. Patient also with a standard walker at home, would benefit from RW for safety. Patient discharged from physical therapy to nursing staff for ambulation as tolerated daily for length of stay.      Recommendations for follow up therapy are one component of a multi-disciplinary discharge planning process, led by the attending physician.  Recommendations may be updated based on patient status, additional functional criteria and insurance authorization.  Follow Up Recommendations Home health PT    Assistance Recommended at Discharge PRN  Functional Status Assessment    Equipment Recommendations  Rolling walker (2 wheels) (patient has standard walker at home without  wheels)    Recommendations for Other Services       Precautions / Restrictions Precautions Precautions: None Restrictions Weight Bearing Restrictions: No      Mobility  Bed Mobility Overal bed mobility: Modified Independent                  Transfers Overall transfer level: Modified independent Equipment used: Rolling walker (2 wheels)                    Ambulation/Gait Ambulation/Gait assistance: Modified independent (Device/Increase time) Gait Distance (Feet): 200 Feet Assistive device: Rolling walker (2 wheels) Gait Pattern/deviations: Decreased stride length;Step-to pattern Gait velocity: reduced   General Gait Details: reduced speed, small steps, patietn in bed room shoes with no backing - would benefit from a slide on shoe with backing to improve walking mechanics and safety with ambualtion  Stairs            Wheelchair Mobility    Modified Rankin (Stroke Patients Only)       Balance  Pertinent Vitals/Pain Pain Assessment: No/denies pain    Home Living Family/patient expects to be discharged to:: Private residence Living Arrangements: Alone Available Help at Discharge: Family;Friend(s) Type of Home: House         Home Layout: One level Home Equipment: BSC;Standard Walker;Cane - single point;Grab bars - toilet;Grab bars - tub/shower      Prior Function Prior Level of Function :  Independent/Modified Independent                     Hand Dominance        Extremity/Trunk Assessment        Lower Extremity Assessment Lower Extremity Assessment: Generalized weakness       Communication   Communication: No difficulties  Cognition Arousal/Alertness: Awake/alert                                              General Comments      Exercises General Exercises - Lower Extremity Ankle Circles/Pumps: Both;Seated;10 reps Long Arc Quad: AROM;Strengthening;Both;10 reps;Seated Hip Flexion/Marching: AROM;Both;10 reps;Standing (B UE support on RW)   Assessment/Plan    PT Assessment Patient does not need any further PT services  PT Problem List         PT Treatment Interventions      PT Goals (Current goals can be found in the Care Plan section)  Acute Rehab PT Goals Patient Stated Goal: to go home PT Goal Formulation: With patient Time For Goal Achievement: 09/24/21 Potential to Achieve Goals: Good    Frequency     Barriers to discharge        Co-evaluation               AM-PAC PT "6 Clicks" Mobility  Outcome Measure Help needed turning from your back to your side while in a flat bed without using bedrails?: None Help needed moving from lying on your back to sitting on the side of a flat bed without using bedrails?: None Help needed moving to and from a bed to a chair (including a wheelchair)?: None Help needed standing up from a chair using your arms (e.g., wheelchair or bedside chair)?: None Help needed to walk in hospital room?: None Help needed climbing 3-5 steps with a railing? : A Little 6 Click Score: 23    End of Session Equipment Utilized During Treatment: Gait belt Activity Tolerance: Patient tolerated treatment well Patient left: in bed;with call bell/phone within reach Nurse Communication: Mobility status PT Visit Diagnosis: Unsteadiness on feet (R26.81);Muscle weakness (generalized) (M62.81)     Time: 0355-9741 PT Time Calculation (min) (ACUTE ONLY): 21 min   Charges:   PT Evaluation $PT Eval Low Complexity: 1 Low PT Treatments $Therapeutic Exercise: 8-22 mins       1:01 PM, 09/10/21 Jerene Pitch, DPT Physical Therapy with [facility]  [phone] office

## 2021-09-12 ENCOUNTER — Telehealth: Payer: Self-pay | Admitting: Family Medicine

## 2021-09-12 NOTE — Telephone Encounter (Signed)
Transition Care Management Follow-up Telephone Call Date of discharge and from where: [facility] 09/10/21 Diagnosis: weakness, ataxic gait How have you been since you were released from the hospital? She feels much better Any questions or concerns? No  Items Reviewed: Did the pt receive and understand the discharge instructions provided? Yes  Medications obtained and verified? Yes  Other? No  Any new allergies since your discharge? No  Dietary orders reviewed? Yes Do you have support at home? Yes   Home Care and Equipment/Supplies: Were home health services ordered? yes If so, what is the name of the agency? [facility]  Has the agency set up a time to come to the patient's home? no Were any new equipment or medical supplies ordered?  Yes: walker What is the name of the medical supply agency? [facility] Were you able to get the supplies/equipment? yes Do you have any questions related to the use of the equipment or supplies? No  Functional Questionnaire: (I = Independent and D = Dependent) ADLs: I  Bathing/Dressing- I  Meal Prep- I  Eating- I  Maintaining continence- I  Transferring/Ambulation- I  Managing Meds- I  Follow up appointments reviewed:  PCP Hospital f/u appt confirmed? Yes  Scheduled to see Dettinger on 09/21/21 @ 1:40. [place] [facility] [facility] f/u appt confirmed? No   Are transportation arrangements needed? No  If their condition worsens, is the pt aware to call PCP or go to the Emergency Dept.? Yes Was the patient provided with contact information for the PCP's office or ED? Yes Was to pt encouraged to call back with questions or concerns? Yes

## 2021-09-13 DIAGNOSIS — K900 Celiac disease: Secondary | ICD-10-CM | POA: Diagnosis not present

## 2021-09-13 DIAGNOSIS — Z7982 Long term (current) use of aspirin: Secondary | ICD-10-CM | POA: Diagnosis not present

## 2021-09-13 DIAGNOSIS — N3281 Overactive bladder: Secondary | ICD-10-CM | POA: Diagnosis not present

## 2021-09-13 DIAGNOSIS — H43812 Vitreous degeneration, left eye: Secondary | ICD-10-CM | POA: Diagnosis not present

## 2021-09-13 DIAGNOSIS — G319 Degenerative disease of nervous system, unspecified: Secondary | ICD-10-CM | POA: Diagnosis not present

## 2021-09-13 DIAGNOSIS — E785 Hyperlipidemia, unspecified: Secondary | ICD-10-CM | POA: Diagnosis not present

## 2021-09-13 DIAGNOSIS — M1990 Unspecified osteoarthritis, unspecified site: Secondary | ICD-10-CM | POA: Diagnosis not present

## 2021-09-13 DIAGNOSIS — K219 Gastro-esophageal reflux disease without esophagitis: Secondary | ICD-10-CM | POA: Diagnosis not present

## 2021-09-13 DIAGNOSIS — M810 Age-related osteoporosis without current pathological fracture: Secondary | ICD-10-CM | POA: Diagnosis not present

## 2021-09-13 DIAGNOSIS — K222 Esophageal obstruction: Secondary | ICD-10-CM | POA: Diagnosis not present

## 2021-09-13 DIAGNOSIS — Z9181 History of falling: Secondary | ICD-10-CM | POA: Diagnosis not present

## 2021-09-13 DIAGNOSIS — M357 Hypermobility syndrome: Secondary | ICD-10-CM | POA: Diagnosis not present

## 2021-09-13 DIAGNOSIS — I10 Essential (primary) hypertension: Secondary | ICD-10-CM | POA: Diagnosis not present

## 2021-09-13 DIAGNOSIS — K449 Diaphragmatic hernia without obstruction or gangrene: Secondary | ICD-10-CM | POA: Diagnosis not present

## 2021-09-13 DIAGNOSIS — G629 Polyneuropathy, unspecified: Secondary | ICD-10-CM | POA: Diagnosis not present

## 2021-09-13 DIAGNOSIS — M6259 Muscle wasting and atrophy, not elsewhere classified, multiple sites: Secondary | ICD-10-CM | POA: Diagnosis not present

## 2021-09-13 DIAGNOSIS — D649 Anemia, unspecified: Secondary | ICD-10-CM | POA: Diagnosis not present

## 2021-09-13 DIAGNOSIS — K2950 Unspecified chronic gastritis without bleeding: Secondary | ICD-10-CM | POA: Diagnosis not present

## 2021-09-15 DIAGNOSIS — K219 Gastro-esophageal reflux disease without esophagitis: Secondary | ICD-10-CM | POA: Diagnosis not present

## 2021-09-15 DIAGNOSIS — K900 Celiac disease: Secondary | ICD-10-CM | POA: Diagnosis not present

## 2021-09-15 DIAGNOSIS — N3281 Overactive bladder: Secondary | ICD-10-CM | POA: Diagnosis not present

## 2021-09-15 DIAGNOSIS — K449 Diaphragmatic hernia without obstruction or gangrene: Secondary | ICD-10-CM | POA: Diagnosis not present

## 2021-09-15 DIAGNOSIS — M357 Hypermobility syndrome: Secondary | ICD-10-CM | POA: Diagnosis not present

## 2021-09-15 DIAGNOSIS — G629 Polyneuropathy, unspecified: Secondary | ICD-10-CM | POA: Diagnosis not present

## 2021-09-15 DIAGNOSIS — Z7982 Long term (current) use of aspirin: Secondary | ICD-10-CM | POA: Diagnosis not present

## 2021-09-15 DIAGNOSIS — D649 Anemia, unspecified: Secondary | ICD-10-CM | POA: Diagnosis not present

## 2021-09-15 DIAGNOSIS — M6259 Muscle wasting and atrophy, not elsewhere classified, multiple sites: Secondary | ICD-10-CM | POA: Diagnosis not present

## 2021-09-15 DIAGNOSIS — M1990 Unspecified osteoarthritis, unspecified site: Secondary | ICD-10-CM | POA: Diagnosis not present

## 2021-09-15 DIAGNOSIS — K2950 Unspecified chronic gastritis without bleeding: Secondary | ICD-10-CM | POA: Diagnosis not present

## 2021-09-15 DIAGNOSIS — E785 Hyperlipidemia, unspecified: Secondary | ICD-10-CM | POA: Diagnosis not present

## 2021-09-15 DIAGNOSIS — H43812 Vitreous degeneration, left eye: Secondary | ICD-10-CM | POA: Diagnosis not present

## 2021-09-15 DIAGNOSIS — I10 Essential (primary) hypertension: Secondary | ICD-10-CM | POA: Diagnosis not present

## 2021-09-15 DIAGNOSIS — M810 Age-related osteoporosis without current pathological fracture: Secondary | ICD-10-CM | POA: Diagnosis not present

## 2021-09-15 DIAGNOSIS — G319 Degenerative disease of nervous system, unspecified: Secondary | ICD-10-CM | POA: Diagnosis not present

## 2021-09-15 DIAGNOSIS — K222 Esophageal obstruction: Secondary | ICD-10-CM | POA: Diagnosis not present

## 2021-09-15 DIAGNOSIS — Z9181 History of falling: Secondary | ICD-10-CM | POA: Diagnosis not present

## 2021-09-20 DIAGNOSIS — M810 Age-related osteoporosis without current pathological fracture: Secondary | ICD-10-CM | POA: Diagnosis not present

## 2021-09-20 DIAGNOSIS — K222 Esophageal obstruction: Secondary | ICD-10-CM | POA: Diagnosis not present

## 2021-09-20 DIAGNOSIS — D649 Anemia, unspecified: Secondary | ICD-10-CM | POA: Diagnosis not present

## 2021-09-20 DIAGNOSIS — G629 Polyneuropathy, unspecified: Secondary | ICD-10-CM | POA: Diagnosis not present

## 2021-09-20 DIAGNOSIS — Z9181 History of falling: Secondary | ICD-10-CM | POA: Diagnosis not present

## 2021-09-20 DIAGNOSIS — M6259 Muscle wasting and atrophy, not elsewhere classified, multiple sites: Secondary | ICD-10-CM | POA: Diagnosis not present

## 2021-09-20 DIAGNOSIS — K449 Diaphragmatic hernia without obstruction or gangrene: Secondary | ICD-10-CM | POA: Diagnosis not present

## 2021-09-20 DIAGNOSIS — N3281 Overactive bladder: Secondary | ICD-10-CM | POA: Diagnosis not present

## 2021-09-20 DIAGNOSIS — M1990 Unspecified osteoarthritis, unspecified site: Secondary | ICD-10-CM | POA: Diagnosis not present

## 2021-09-20 DIAGNOSIS — E785 Hyperlipidemia, unspecified: Secondary | ICD-10-CM | POA: Diagnosis not present

## 2021-09-20 DIAGNOSIS — H43812 Vitreous degeneration, left eye: Secondary | ICD-10-CM | POA: Diagnosis not present

## 2021-09-20 DIAGNOSIS — K219 Gastro-esophageal reflux disease without esophagitis: Secondary | ICD-10-CM | POA: Diagnosis not present

## 2021-09-20 DIAGNOSIS — Z7982 Long term (current) use of aspirin: Secondary | ICD-10-CM | POA: Diagnosis not present

## 2021-09-20 DIAGNOSIS — M357 Hypermobility syndrome: Secondary | ICD-10-CM | POA: Diagnosis not present

## 2021-09-20 DIAGNOSIS — K900 Celiac disease: Secondary | ICD-10-CM | POA: Diagnosis not present

## 2021-09-20 DIAGNOSIS — G319 Degenerative disease of nervous system, unspecified: Secondary | ICD-10-CM | POA: Diagnosis not present

## 2021-09-20 DIAGNOSIS — K2950 Unspecified chronic gastritis without bleeding: Secondary | ICD-10-CM | POA: Diagnosis not present

## 2021-09-20 DIAGNOSIS — I10 Essential (primary) hypertension: Secondary | ICD-10-CM | POA: Diagnosis not present

## 2021-09-21 ENCOUNTER — Encounter: Payer: Self-pay | Admitting: Family Medicine

## 2021-09-21 ENCOUNTER — Other Ambulatory Visit: Payer: Self-pay

## 2021-09-21 ENCOUNTER — Ambulatory Visit (INDEPENDENT_AMBULATORY_CARE_PROVIDER_SITE_OTHER): Payer: Medicare Other | Admitting: Family Medicine

## 2021-09-21 VITALS — BP 119/72 | HR 81 | Ht 60.0 in | Wt 95.0 lb

## 2021-09-21 DIAGNOSIS — R260 Ataxic gait: Secondary | ICD-10-CM | POA: Diagnosis not present

## 2021-09-21 DIAGNOSIS — M6388 Disorders of muscle in diseases classified elsewhere, other site: Secondary | ICD-10-CM | POA: Diagnosis not present

## 2021-09-21 DIAGNOSIS — M6259 Muscle wasting and atrophy, not elsewhere classified, multiple sites: Secondary | ICD-10-CM

## 2021-09-21 NOTE — Progress Notes (Addendum)
BP 119/72   Pulse 81   Ht 5' (1.524 m)   Wt 95 lb (43.1 kg)   SpO2 97%   BMI 18.55 kg/m    Subjective:   Patient ID: Kristi Sawyer, female    DOB: August 03, 1926, 87 y.o.   MRN: [id number]  HPI: Kristi Sawyer is a 87 y.o. female presenting on 09/21/2021 for Hospitalization Follow-up (/), Weakness, and change in gait   HPI Transition Care Management Follow-up Telephone Call Date of discharge and from where: [facility] 09/10/21 Diagnosis: weakness, ataxic gait How have you been since you were released from the hospital? She feels much better Any questions or concerns? No Contacted by Adalberto Cole LPN on 26/20/3559  Transition of care office visit Patient is coming in today for transition of care office visit.  She was in [facility] on 09/09/2021 and discharged on 09/10/2021 for weakness and ataxia and balance issues.  She was found to be ataxic and normally she walks and not just fine but happened all of a sudden the morning she woke up like this.  They did do a CT scan but did not do an MRI.  She is work with physical therapy in her house and they are helping but she is still very ataxic when she normally walks in here just fine.  Relevant past medical, surgical, family and social history reviewed and updated as indicated. Interim medical history since our last visit reviewed. Allergies and medications reviewed and updated.  Review of Systems  Constitutional:  Negative for chills and fever.  Eyes:  Negative for visual disturbance.  Respiratory:  Negative for chest tightness and shortness of breath.   Cardiovascular:  Negative for chest pain and leg swelling.  Musculoskeletal:  Positive for gait problem. Negative for arthralgias and back pain.  Skin:  Negative for rash.  Neurological:  Positive for weakness. Negative for dizziness, facial asymmetry, light-headedness, numbness and headaches.  Psychiatric/Behavioral:  Negative for agitation and behavioral problems.   All other  systems reviewed and are negative.  Per HPI unless specifically indicated above   Allergies as of 09/21/2021       Reactions   Prevnar 13 [pneumococcal 13-val Conj Vacc] Hives   Red/streaking arm   Penicillins Hives   Has patient had a PCN reaction causing immediate rash, facial/tongue/throat swelling, SOB or lightheadedness with hypotension: No Has patient had a PCN reaction causing severe rash involving mucus membranes or skin necrosis: No Has patient had a PCN reaction that required hospitalization: No Has patient had a PCN reaction occurring within the last 10 years: No If all of the above answers are "NO", then may proceed with Cephalosporin use.   Tetanus Toxoids Other (See Comments)   Area of injection was red and streaked.   Codeine Nausea Only   Sulfonamide Derivatives Nausea Only        Medication List        Accurate as of September 21, 2021 11:59 PM. If you have any questions, ask your nurse or doctor.          amLODipine 5 MG tablet Commonly known as: NORVASC Take 1 tablet (5 mg total) by mouth daily.   aspirin 81 MG EC tablet Take 81 mg by mouth daily.   CALTRATE PLUS PO Take 1 tablet by mouth daily.   CENTRUM SILVER PO Take 1 tablet by mouth daily.   Fish Oil 1000 MG Caps Take 1 capsule by mouth 3 (three) times daily.  hydrALAZINE 10 MG tablet Commonly known as: APRESOLINE Take 1 tablet (10 mg total) by mouth every 8 (eight) hours.   Integra 62.5-62.5-40-3 MG Caps TAKE 1 CAPSULE BY MOUTH EVERY DAY   Inulin 1.5 g Chew Chew 5 tablets by mouth daily. FIBER CHOICE   meclizine 12.5 MG tablet Commonly known as: ANTIVERT Take 1 tablet (12.5 mg total) by mouth 3 (three) times daily as needed for dizziness.   metoCLOPramide 10 MG tablet Commonly known as: REGLAN Take 1 tablet (10 mg total) by mouth at bedtime.   mirabegron ER 50 MG Tb24 tablet Commonly known as: Myrbetriq Take 1 tablet (50 mg total) by mouth daily.   omeprazole 20 MG  capsule Commonly known as: PRILOSEC TAKE 1-2 CAPSULES (20-40 MG TOTAL) BY MOUTH DAILY.   rosuvastatin 5 MG tablet Commonly known as: CRESTOR TAKE 1 TABLET BY MOUTH ON MONDAY, WEDNESDAY, AND FRIDAY What changed: See the new instructions.   Vitamin D 50 MCG (2000 UT) Caps Take by mouth as directed. Take 1 tablet daily except none on saturdays and sundays         Objective:   BP 119/72   Pulse 81   Ht 5' (1.524 m)   Wt 95 lb (43.1 kg)   SpO2 97%   BMI 18.55 kg/m   Wt Readings from Last 3 Encounters:  09/21/21 95 lb (43.1 kg)  09/09/21 95 lb 0.3 oz (43.1 kg)  04/20/21 95 lb (43.1 kg)    Physical Exam Vitals and nursing note reviewed.  Constitutional:      General: She is not in acute distress.    Appearance: She is well-developed. She is not diaphoretic.  Eyes:     General: No visual field deficit.    Conjunctiva/sclera: Conjunctivae normal.  Cardiovascular:     Rate and Rhythm: Normal rate and regular rhythm.     Heart sounds: Normal heart sounds. No murmur heard. Pulmonary:     Effort: Pulmonary effort is normal. No respiratory distress.     Breath sounds: Normal breath sounds. No wheezing.  Musculoskeletal:        General: No swelling or tenderness. Normal range of motion.  Skin:    General: Skin is warm and dry.     Findings: No rash.  Neurological:     Mental Status: She is alert and oriented to person, place, and time.     Cranial Nerves: No facial asymmetry.     Sensory: Sensation is intact.     Motor: Weakness (3 out of 5 strength in bilateral lower extremities and 4 out of 5 strength in bilateral upper extremities) present.     Coordination: Coordination normal.     Gait: Gait abnormal and tandem walk abnormal.  Psychiatric:        Behavior: Behavior normal.      Assessment & Plan:   Problem List Items Addressed This Visit       Other   Ataxic gait - Primary   Relevant Orders   MR Brain Wo Contrast (Completed)   Other Visit Diagnoses      Disorders of muscle in diseases classified elsewhere, other site       Atrophy of muscle of multiple sites           Will order MRI brain, concerned that she possibly had a stroke and still having some balance issues, already has physical therapy.  If positive will do neurological referral and start aspirin, if negative then continue to work with physical  therapy Follow up plan: Return if symptoms worsen or fail to improve.  Counseling provided for all of the vaccine components Orders Placed This Encounter  Procedures   MR Brain Wo Contrast    [name], MD [facility] 09/23/2021, 8:02 AM

## 2021-09-22 ENCOUNTER — Ambulatory Visit (HOSPITAL_COMMUNITY)
Admission: RE | Admit: 2021-09-22 | Discharge: 2021-09-22 | Disposition: A | Discharge status: Home / Self Care | Payer: Medicare Other | Source: Ambulatory Visit | Attending: Family Medicine | Admitting: Family Medicine

## 2021-09-22 DIAGNOSIS — Z9181 History of falling: Secondary | ICD-10-CM | POA: Diagnosis not present

## 2021-09-22 DIAGNOSIS — K2950 Unspecified chronic gastritis without bleeding: Secondary | ICD-10-CM | POA: Diagnosis not present

## 2021-09-22 DIAGNOSIS — N3281 Overactive bladder: Secondary | ICD-10-CM | POA: Diagnosis not present

## 2021-09-22 DIAGNOSIS — M357 Hypermobility syndrome: Secondary | ICD-10-CM | POA: Diagnosis not present

## 2021-09-22 DIAGNOSIS — R531 Weakness: Secondary | ICD-10-CM | POA: Diagnosis not present

## 2021-09-22 DIAGNOSIS — K219 Gastro-esophageal reflux disease without esophagitis: Secondary | ICD-10-CM | POA: Diagnosis not present

## 2021-09-22 DIAGNOSIS — E785 Hyperlipidemia, unspecified: Secondary | ICD-10-CM | POA: Diagnosis not present

## 2021-09-22 DIAGNOSIS — R270 Ataxia, unspecified: Secondary | ICD-10-CM | POA: Diagnosis not present

## 2021-09-22 DIAGNOSIS — I10 Essential (primary) hypertension: Secondary | ICD-10-CM | POA: Diagnosis not present

## 2021-09-22 DIAGNOSIS — K222 Esophageal obstruction: Secondary | ICD-10-CM | POA: Diagnosis not present

## 2021-09-22 DIAGNOSIS — G319 Degenerative disease of nervous system, unspecified: Secondary | ICD-10-CM | POA: Diagnosis not present

## 2021-09-22 DIAGNOSIS — G629 Polyneuropathy, unspecified: Secondary | ICD-10-CM | POA: Diagnosis not present

## 2021-09-22 DIAGNOSIS — R260 Ataxic gait: Secondary | ICD-10-CM | POA: Insufficient documentation

## 2021-09-22 DIAGNOSIS — H43812 Vitreous degeneration, left eye: Secondary | ICD-10-CM | POA: Diagnosis not present

## 2021-09-22 DIAGNOSIS — R262 Difficulty in walking, not elsewhere classified: Secondary | ICD-10-CM | POA: Diagnosis not present

## 2021-09-22 DIAGNOSIS — M810 Age-related osteoporosis without current pathological fracture: Secondary | ICD-10-CM | POA: Diagnosis not present

## 2021-09-22 DIAGNOSIS — K449 Diaphragmatic hernia without obstruction or gangrene: Secondary | ICD-10-CM | POA: Diagnosis not present

## 2021-09-22 DIAGNOSIS — K900 Celiac disease: Secondary | ICD-10-CM | POA: Diagnosis not present

## 2021-09-22 DIAGNOSIS — M6259 Muscle wasting and atrophy, not elsewhere classified, multiple sites: Secondary | ICD-10-CM | POA: Diagnosis not present

## 2021-09-22 DIAGNOSIS — D649 Anemia, unspecified: Secondary | ICD-10-CM | POA: Diagnosis not present

## 2021-09-22 DIAGNOSIS — Z7982 Long term (current) use of aspirin: Secondary | ICD-10-CM | POA: Diagnosis not present

## 2021-09-22 DIAGNOSIS — M1990 Unspecified osteoarthritis, unspecified site: Secondary | ICD-10-CM | POA: Diagnosis not present

## 2021-09-23 ENCOUNTER — Telehealth: Payer: Self-pay | Admitting: Family Medicine

## 2021-09-23 NOTE — Telephone Encounter (Signed)
Pt calling about MRI results. Please call back and advise.

## 2021-09-23 NOTE — Telephone Encounter (Signed)
Pt has been informed of results and Dettinger's recommendations.

## 2021-09-26 DIAGNOSIS — K449 Diaphragmatic hernia without obstruction or gangrene: Secondary | ICD-10-CM | POA: Diagnosis not present

## 2021-09-26 DIAGNOSIS — M1990 Unspecified osteoarthritis, unspecified site: Secondary | ICD-10-CM | POA: Diagnosis not present

## 2021-09-26 DIAGNOSIS — G629 Polyneuropathy, unspecified: Secondary | ICD-10-CM | POA: Diagnosis not present

## 2021-09-26 DIAGNOSIS — K219 Gastro-esophageal reflux disease without esophagitis: Secondary | ICD-10-CM | POA: Diagnosis not present

## 2021-09-26 DIAGNOSIS — N3281 Overactive bladder: Secondary | ICD-10-CM | POA: Diagnosis not present

## 2021-09-26 DIAGNOSIS — D649 Anemia, unspecified: Secondary | ICD-10-CM | POA: Diagnosis not present

## 2021-09-26 DIAGNOSIS — Z7982 Long term (current) use of aspirin: Secondary | ICD-10-CM | POA: Diagnosis not present

## 2021-09-26 DIAGNOSIS — E785 Hyperlipidemia, unspecified: Secondary | ICD-10-CM | POA: Diagnosis not present

## 2021-09-26 DIAGNOSIS — Z9181 History of falling: Secondary | ICD-10-CM | POA: Diagnosis not present

## 2021-09-26 DIAGNOSIS — M357 Hypermobility syndrome: Secondary | ICD-10-CM | POA: Diagnosis not present

## 2021-09-26 DIAGNOSIS — K900 Celiac disease: Secondary | ICD-10-CM | POA: Diagnosis not present

## 2021-09-26 DIAGNOSIS — K222 Esophageal obstruction: Secondary | ICD-10-CM | POA: Diagnosis not present

## 2021-09-26 DIAGNOSIS — H43812 Vitreous degeneration, left eye: Secondary | ICD-10-CM | POA: Diagnosis not present

## 2021-09-26 DIAGNOSIS — G319 Degenerative disease of nervous system, unspecified: Secondary | ICD-10-CM | POA: Diagnosis not present

## 2021-09-26 DIAGNOSIS — K2950 Unspecified chronic gastritis without bleeding: Secondary | ICD-10-CM | POA: Diagnosis not present

## 2021-09-26 DIAGNOSIS — M810 Age-related osteoporosis without current pathological fracture: Secondary | ICD-10-CM | POA: Diagnosis not present

## 2021-09-26 DIAGNOSIS — M6259 Muscle wasting and atrophy, not elsewhere classified, multiple sites: Secondary | ICD-10-CM | POA: Diagnosis not present

## 2021-09-26 DIAGNOSIS — I10 Essential (primary) hypertension: Secondary | ICD-10-CM | POA: Diagnosis not present

## 2021-09-28 DIAGNOSIS — K449 Diaphragmatic hernia without obstruction or gangrene: Secondary | ICD-10-CM | POA: Diagnosis not present

## 2021-09-28 DIAGNOSIS — K219 Gastro-esophageal reflux disease without esophagitis: Secondary | ICD-10-CM | POA: Diagnosis not present

## 2021-09-28 DIAGNOSIS — Z7982 Long term (current) use of aspirin: Secondary | ICD-10-CM | POA: Diagnosis not present

## 2021-09-28 DIAGNOSIS — D649 Anemia, unspecified: Secondary | ICD-10-CM | POA: Diagnosis not present

## 2021-09-28 DIAGNOSIS — N3281 Overactive bladder: Secondary | ICD-10-CM | POA: Diagnosis not present

## 2021-09-28 DIAGNOSIS — K222 Esophageal obstruction: Secondary | ICD-10-CM | POA: Diagnosis not present

## 2021-09-28 DIAGNOSIS — I10 Essential (primary) hypertension: Secondary | ICD-10-CM | POA: Diagnosis not present

## 2021-09-28 DIAGNOSIS — M1990 Unspecified osteoarthritis, unspecified site: Secondary | ICD-10-CM | POA: Diagnosis not present

## 2021-09-28 DIAGNOSIS — M357 Hypermobility syndrome: Secondary | ICD-10-CM | POA: Diagnosis not present

## 2021-09-28 DIAGNOSIS — K900 Celiac disease: Secondary | ICD-10-CM | POA: Diagnosis not present

## 2021-09-28 DIAGNOSIS — Z9181 History of falling: Secondary | ICD-10-CM | POA: Diagnosis not present

## 2021-09-28 DIAGNOSIS — G629 Polyneuropathy, unspecified: Secondary | ICD-10-CM | POA: Diagnosis not present

## 2021-09-28 DIAGNOSIS — H43812 Vitreous degeneration, left eye: Secondary | ICD-10-CM | POA: Diagnosis not present

## 2021-09-28 DIAGNOSIS — K2950 Unspecified chronic gastritis without bleeding: Secondary | ICD-10-CM | POA: Diagnosis not present

## 2021-09-28 DIAGNOSIS — M6259 Muscle wasting and atrophy, not elsewhere classified, multiple sites: Secondary | ICD-10-CM | POA: Diagnosis not present

## 2021-09-28 DIAGNOSIS — G319 Degenerative disease of nervous system, unspecified: Secondary | ICD-10-CM | POA: Diagnosis not present

## 2021-09-28 DIAGNOSIS — M810 Age-related osteoporosis without current pathological fracture: Secondary | ICD-10-CM | POA: Diagnosis not present

## 2021-09-28 DIAGNOSIS — E785 Hyperlipidemia, unspecified: Secondary | ICD-10-CM | POA: Diagnosis not present

## 2021-09-29 ENCOUNTER — Ambulatory Visit: Payer: Medicare Other | Admitting: Family Medicine

## 2021-09-30 ENCOUNTER — Other Ambulatory Visit: Payer: Self-pay

## 2021-09-30 ENCOUNTER — Ambulatory Visit (INDEPENDENT_AMBULATORY_CARE_PROVIDER_SITE_OTHER): Payer: Medicare Other

## 2021-09-30 DIAGNOSIS — K222 Esophageal obstruction: Secondary | ICD-10-CM | POA: Diagnosis not present

## 2021-09-30 DIAGNOSIS — M810 Age-related osteoporosis without current pathological fracture: Secondary | ICD-10-CM

## 2021-09-30 DIAGNOSIS — G319 Degenerative disease of nervous system, unspecified: Secondary | ICD-10-CM

## 2021-09-30 DIAGNOSIS — H43812 Vitreous degeneration, left eye: Secondary | ICD-10-CM

## 2021-09-30 DIAGNOSIS — K219 Gastro-esophageal reflux disease without esophagitis: Secondary | ICD-10-CM

## 2021-09-30 DIAGNOSIS — K2950 Unspecified chronic gastritis without bleeding: Secondary | ICD-10-CM

## 2021-09-30 DIAGNOSIS — K900 Celiac disease: Secondary | ICD-10-CM | POA: Diagnosis not present

## 2021-09-30 DIAGNOSIS — N3281 Overactive bladder: Secondary | ICD-10-CM

## 2021-09-30 DIAGNOSIS — Z7982 Long term (current) use of aspirin: Secondary | ICD-10-CM

## 2021-09-30 DIAGNOSIS — D649 Anemia, unspecified: Secondary | ICD-10-CM

## 2021-09-30 DIAGNOSIS — I10 Essential (primary) hypertension: Secondary | ICD-10-CM | POA: Diagnosis not present

## 2021-09-30 DIAGNOSIS — M357 Hypermobility syndrome: Secondary | ICD-10-CM

## 2021-09-30 DIAGNOSIS — E785 Hyperlipidemia, unspecified: Secondary | ICD-10-CM

## 2021-09-30 DIAGNOSIS — M1990 Unspecified osteoarthritis, unspecified site: Secondary | ICD-10-CM | POA: Diagnosis not present

## 2021-09-30 DIAGNOSIS — K449 Diaphragmatic hernia without obstruction or gangrene: Secondary | ICD-10-CM | POA: Diagnosis not present

## 2021-09-30 DIAGNOSIS — M6250 Muscle wasting and atrophy, not elsewhere classified, unspecified site: Secondary | ICD-10-CM

## 2021-09-30 DIAGNOSIS — G629 Polyneuropathy, unspecified: Secondary | ICD-10-CM

## 2021-09-30 DIAGNOSIS — F419 Anxiety disorder, unspecified: Secondary | ICD-10-CM

## 2021-10-03 ENCOUNTER — Other Ambulatory Visit: Payer: Self-pay | Admitting: Internal Medicine

## 2021-10-04 DIAGNOSIS — K2950 Unspecified chronic gastritis without bleeding: Secondary | ICD-10-CM | POA: Diagnosis not present

## 2021-10-04 DIAGNOSIS — G629 Polyneuropathy, unspecified: Secondary | ICD-10-CM | POA: Diagnosis not present

## 2021-10-04 DIAGNOSIS — M6259 Muscle wasting and atrophy, not elsewhere classified, multiple sites: Secondary | ICD-10-CM | POA: Diagnosis not present

## 2021-10-04 DIAGNOSIS — M357 Hypermobility syndrome: Secondary | ICD-10-CM | POA: Diagnosis not present

## 2021-10-04 DIAGNOSIS — E785 Hyperlipidemia, unspecified: Secondary | ICD-10-CM | POA: Diagnosis not present

## 2021-10-04 DIAGNOSIS — N3281 Overactive bladder: Secondary | ICD-10-CM | POA: Diagnosis not present

## 2021-10-04 DIAGNOSIS — Z9181 History of falling: Secondary | ICD-10-CM | POA: Diagnosis not present

## 2021-10-04 DIAGNOSIS — I10 Essential (primary) hypertension: Secondary | ICD-10-CM | POA: Diagnosis not present

## 2021-10-04 DIAGNOSIS — D649 Anemia, unspecified: Secondary | ICD-10-CM | POA: Diagnosis not present

## 2021-10-04 DIAGNOSIS — K900 Celiac disease: Secondary | ICD-10-CM | POA: Diagnosis not present

## 2021-10-04 DIAGNOSIS — M810 Age-related osteoporosis without current pathological fracture: Secondary | ICD-10-CM | POA: Diagnosis not present

## 2021-10-04 DIAGNOSIS — K449 Diaphragmatic hernia without obstruction or gangrene: Secondary | ICD-10-CM | POA: Diagnosis not present

## 2021-10-04 DIAGNOSIS — K219 Gastro-esophageal reflux disease without esophagitis: Secondary | ICD-10-CM | POA: Diagnosis not present

## 2021-10-04 DIAGNOSIS — H43812 Vitreous degeneration, left eye: Secondary | ICD-10-CM | POA: Diagnosis not present

## 2021-10-04 DIAGNOSIS — K222 Esophageal obstruction: Secondary | ICD-10-CM | POA: Diagnosis not present

## 2021-10-04 DIAGNOSIS — G319 Degenerative disease of nervous system, unspecified: Secondary | ICD-10-CM | POA: Diagnosis not present

## 2021-10-04 DIAGNOSIS — M1990 Unspecified osteoarthritis, unspecified site: Secondary | ICD-10-CM | POA: Diagnosis not present

## 2021-10-04 DIAGNOSIS — Z7982 Long term (current) use of aspirin: Secondary | ICD-10-CM | POA: Diagnosis not present

## 2021-10-11 DIAGNOSIS — M810 Age-related osteoporosis without current pathological fracture: Secondary | ICD-10-CM | POA: Diagnosis not present

## 2021-10-11 DIAGNOSIS — N3281 Overactive bladder: Secondary | ICD-10-CM | POA: Diagnosis not present

## 2021-10-11 DIAGNOSIS — K2950 Unspecified chronic gastritis without bleeding: Secondary | ICD-10-CM | POA: Diagnosis not present

## 2021-10-11 DIAGNOSIS — I10 Essential (primary) hypertension: Secondary | ICD-10-CM | POA: Diagnosis not present

## 2021-10-11 DIAGNOSIS — E785 Hyperlipidemia, unspecified: Secondary | ICD-10-CM | POA: Diagnosis not present

## 2021-10-11 DIAGNOSIS — K449 Diaphragmatic hernia without obstruction or gangrene: Secondary | ICD-10-CM | POA: Diagnosis not present

## 2021-10-11 DIAGNOSIS — K900 Celiac disease: Secondary | ICD-10-CM | POA: Diagnosis not present

## 2021-10-11 DIAGNOSIS — Z9181 History of falling: Secondary | ICD-10-CM | POA: Diagnosis not present

## 2021-10-11 DIAGNOSIS — M1990 Unspecified osteoarthritis, unspecified site: Secondary | ICD-10-CM | POA: Diagnosis not present

## 2021-10-11 DIAGNOSIS — D649 Anemia, unspecified: Secondary | ICD-10-CM | POA: Diagnosis not present

## 2021-10-11 DIAGNOSIS — M6259 Muscle wasting and atrophy, not elsewhere classified, multiple sites: Secondary | ICD-10-CM | POA: Diagnosis not present

## 2021-10-11 DIAGNOSIS — H43812 Vitreous degeneration, left eye: Secondary | ICD-10-CM | POA: Diagnosis not present

## 2021-10-11 DIAGNOSIS — M357 Hypermobility syndrome: Secondary | ICD-10-CM | POA: Diagnosis not present

## 2021-10-11 DIAGNOSIS — G319 Degenerative disease of nervous system, unspecified: Secondary | ICD-10-CM | POA: Diagnosis not present

## 2021-10-11 DIAGNOSIS — G629 Polyneuropathy, unspecified: Secondary | ICD-10-CM | POA: Diagnosis not present

## 2021-10-11 DIAGNOSIS — K219 Gastro-esophageal reflux disease without esophagitis: Secondary | ICD-10-CM | POA: Diagnosis not present

## 2021-10-11 DIAGNOSIS — Z7982 Long term (current) use of aspirin: Secondary | ICD-10-CM | POA: Diagnosis not present

## 2021-10-11 DIAGNOSIS — K222 Esophageal obstruction: Secondary | ICD-10-CM | POA: Diagnosis not present

## 2021-10-17 ENCOUNTER — Ambulatory Visit (INDEPENDENT_AMBULATORY_CARE_PROVIDER_SITE_OTHER): Payer: Medicare Other | Admitting: Family Medicine

## 2021-10-17 ENCOUNTER — Encounter: Payer: Self-pay | Admitting: Family Medicine

## 2021-10-17 VITALS — BP 165/82 | HR 76 | Ht 60.0 in | Wt 94.0 lb

## 2021-10-17 DIAGNOSIS — R260 Ataxic gait: Secondary | ICD-10-CM

## 2021-10-17 DIAGNOSIS — E782 Mixed hyperlipidemia: Secondary | ICD-10-CM | POA: Diagnosis not present

## 2021-10-17 DIAGNOSIS — K219 Gastro-esophageal reflux disease without esophagitis: Secondary | ICD-10-CM | POA: Diagnosis not present

## 2021-10-17 DIAGNOSIS — N3281 Overactive bladder: Secondary | ICD-10-CM

## 2021-10-17 DIAGNOSIS — R5381 Other malaise: Secondary | ICD-10-CM

## 2021-10-17 DIAGNOSIS — M810 Age-related osteoporosis without current pathological fracture: Secondary | ICD-10-CM

## 2021-10-17 DIAGNOSIS — I10 Essential (primary) hypertension: Secondary | ICD-10-CM

## 2021-10-17 MED ORDER — AMLODIPINE BESYLATE 5 MG PO TABS: 5.0000 mg | ORAL_TABLET | Freq: Every day | ORAL | 3 refills | Status: DC

## 2021-10-17 MED ORDER — MIRABEGRON ER 50 MG PO TB24: 50.0000 mg | ORAL_TABLET | Freq: Every day | ORAL | 3 refills | Status: DC

## 2021-10-17 MED ORDER — OMEPRAZOLE 20 MG PO CPDR: 20.0000 mg | DELAYED_RELEASE_CAPSULE | Freq: Every day | ORAL | 3 refills | Status: DC

## 2021-10-17 MED ORDER — ROSUVASTATIN CALCIUM 5 MG PO TABS: 5.0000 mg | ORAL_TABLET | ORAL | 3 refills | Status: DC

## 2021-10-17 MED ORDER — HYDRALAZINE HCL 10 MG PO TABS: 10.0000 mg | ORAL_TABLET | Freq: Three times a day (TID) | ORAL | 3 refills | Status: DC

## 2021-10-17 NOTE — Progress Notes (Signed)
BP (!) 165/82   Pulse 76   Ht 5' (1.524 m)   Wt 94 lb (42.6 kg)   SpO2 98%   BMI 18.36 kg/m    Subjective:   Patient ID: Kristi Sawyer, female    DOB: [birth year], 87 y.o.   MRN: [id number]  HPI: Kristi Sawyer is a 87 y.o. female presenting on 10/17/2021 for balance concerns (Feels unsteady on her feet for several weeks. Denies dizziness)   HPI Hyperlipidemia Patient is coming in for recheck of his hyperlipidemia. The patient is currently taking fish oil and Crestor. They deny any issues with myalgias or history of liver damage from it. They deny any focal numbness or weakness or chest pain.   GERD Patient is currently on omeprazole.  She denies any major symptoms or abdominal pain or belching or burping. She denies any blood in her stool or lightheadedness or dizziness.    Hypertension Patient is currently on amlodipine and hydralazine, and their blood pressure today is 165/82. Patient denies any lightheadedness or dizziness. Patient denies headaches, blurred vision, chest pains, shortness of breath, or weakness. Denies any side effects from medication and is content with current medication.   Osteoporosis/osteopenia Fractures or history of fracture: None recently Medication: Vitamin D and calcium Duration of treatment: Years Last bone density scan: 10/21/2020 Last T score: -4.2  Overactive bladder Patient takes Myrbetriq for overactive bladder and says that is doing well for her  Patient still complaining of some balance issues that she has been having, she did physical therapy at home and it is better.  She had an MRI that was essentially normal for her age, will do in person physical therapy here to continue strengthening.  Relevant past medical, surgical, family and social history reviewed and updated as indicated. Interim medical history since our last visit reviewed. Allergies and medications reviewed and updated.  Review of Systems  Constitutional:  Negative for chills  and fever.  HENT:  Negative for congestion.   Eyes:  Negative for visual disturbance.  Respiratory:  Negative for chest tightness and shortness of breath.   Cardiovascular:  Negative for chest pain and leg swelling.  Musculoskeletal:  Negative for back pain and gait problem.  Skin:  Negative for rash.  Neurological:  Negative for dizziness, light-headedness and headaches.  Psychiatric/Behavioral:  Negative for agitation and behavioral problems.   All other systems reviewed and are negative.  Per HPI unless specifically indicated above   Allergies as of 10/17/2021       Reactions   Prevnar 13 [pneumococcal 13-val Conj Vacc] Hives   Red/streaking arm   Penicillins Hives   Has patient had a PCN reaction causing immediate rash, facial/tongue/throat swelling, SOB or lightheadedness with hypotension: No Has patient had a PCN reaction causing severe rash involving mucus membranes or skin necrosis: No Has patient had a PCN reaction that required hospitalization: No Has patient had a PCN reaction occurring within the last 10 years: No If all of the above answers are "NO", then may proceed with Cephalosporin use.   Tetanus Toxoids Other (See Comments)   Area of injection was red and streaked.   Codeine Nausea Only   Sulfonamide Derivatives Nausea Only        Medication List        Accurate as of October 17, 2021 11:56 AM. If you have any questions, ask your nurse or doctor.          amLODipine 5 MG tablet Commonly known as:  NORVASC Take 1 tablet (5 mg total) by mouth daily.   aspirin 81 MG EC tablet Take 81 mg by mouth daily.   CALTRATE PLUS PO Take 1 tablet by mouth daily.   CENTRUM SILVER PO Take 1 tablet by mouth daily.   Fish Oil 1000 MG Caps Take 1 capsule by mouth 3 (three) times daily.   hydrALAZINE 10 MG tablet Commonly known as: APRESOLINE Take 1 tablet (10 mg total) by mouth every 8 (eight) hours.   Integra 62.5-62.5-40-3 MG Caps TAKE 1 CAPSULE BY MOUTH  EVERY DAY   Inulin 1.5 g Chew Chew 5 tablets by mouth daily. FIBER CHOICE   meclizine 12.5 MG tablet Commonly known as: ANTIVERT Take 1 tablet (12.5 mg total) by mouth 3 (three) times daily as needed for dizziness.   metoCLOPramide 10 MG tablet Commonly known as: REGLAN Take 1 tablet (10 mg total) by mouth at bedtime.   mirabegron ER 50 MG Tb24 tablet Commonly known as: Myrbetriq Take 1 tablet (50 mg total) by mouth daily.   omeprazole 20 MG capsule Commonly known as: PRILOSEC Take 1-2 capsules (20-40 mg total) by mouth daily.   rosuvastatin 5 MG tablet Commonly known as: CRESTOR Take 1 tablet (5 mg total) by mouth 3 (three) times a week. What changed: See the new instructions. Changed by: Worthy Rancher, MD   Vitamin D 50 MCG (2000 UT) Caps Take by mouth as directed. Take 1 tablet daily except none on saturdays and sundays         Objective:   BP (!) 165/82   Pulse 76   Ht 5' (1.524 m)   Wt 94 lb (42.6 kg)   SpO2 98%   BMI 18.36 kg/m   Wt Readings from Last 3 Encounters:  10/17/21 94 lb (42.6 kg)  09/21/21 95 lb (43.1 kg)  09/09/21 95 lb 0.3 oz (43.1 kg)    Physical Exam Vitals and nursing note reviewed.  Constitutional:      General: She is not in acute distress.    Appearance: She is well-developed. She is not diaphoretic.  Eyes:     Conjunctiva/sclera: Conjunctivae normal.  Cardiovascular:     Rate and Rhythm: Normal rate and regular rhythm.     Heart sounds: Normal heart sounds. No murmur heard. Pulmonary:     Effort: Pulmonary effort is normal. No respiratory distress.     Breath sounds: Normal breath sounds. No wheezing.  Musculoskeletal:        General: No swelling or tenderness. Normal range of motion.  Skin:    General: Skin is warm and dry.     Findings: No rash.  Neurological:     Mental Status: She is alert and oriented to person, place, and time.     Coordination: Coordination normal.  Psychiatric:        Behavior: Behavior  normal.      Assessment & Plan:   Problem List Items Addressed This Visit       Cardiovascular and Mediastinum   Hypertension   Relevant Medications   amLODipine (NORVASC) 5 MG tablet   hydrALAZINE (APRESOLINE) 10 MG tablet   rosuvastatin (CRESTOR) 5 MG tablet   Other Relevant Orders   CMP14+EGFR     Digestive   GERD (gastroesophageal reflux disease)   Relevant Medications   omeprazole (PRILOSEC) 20 MG capsule   Other Relevant Orders   CBC with Differential/Platelet     Musculoskeletal and Integument   Osteoporosis without current pathological fracture -  Primary   Relevant Orders   VITAMIN D 25 Hydroxy (Vit-D Deficiency, Fractures)     Genitourinary   Overactive bladder   Relevant Medications   mirabegron ER (MYRBETRIQ) 50 MG TB24 tablet     Other   Ataxic gait   Relevant Orders   TSH   Ambulatory referral to Physical Therapy   Physical deconditioning   Relevant Orders   Ambulatory referral to Physical Therapy   Hyperlipidemia   Relevant Medications   amLODipine (NORVASC) 5 MG tablet   hydrALAZINE (APRESOLINE) 10 MG tablet   rosuvastatin (CRESTOR) 5 MG tablet   Other Relevant Orders   Lipid panel    Continue current medicine, will watch blood pressure closely, will do referral to physical therapy    Follow up plan: Return in about 6 months (around 04/17/2022), or if symptoms worsen or fail to improve, for htn and hld.  Counseling provided for all of the vaccine components Orders Placed This Encounter  Procedures   CMP14+EGFR   Lipid panel   CBC with Differential/Platelet   TSH   VITAMIN D 25 Hydroxy (Vit-D Deficiency, Fractures)   Ambulatory referral to Physical Therapy    [name], MD [facility] 10/17/2021, 11:56 AM

## 2021-10-18 ENCOUNTER — Other Ambulatory Visit: Payer: Medicare Other

## 2021-10-18 DIAGNOSIS — R260 Ataxic gait: Secondary | ICD-10-CM | POA: Diagnosis not present

## 2021-10-18 DIAGNOSIS — M810 Age-related osteoporosis without current pathological fracture: Secondary | ICD-10-CM | POA: Diagnosis not present

## 2021-10-18 DIAGNOSIS — E782 Mixed hyperlipidemia: Secondary | ICD-10-CM | POA: Diagnosis not present

## 2021-10-18 DIAGNOSIS — I10 Essential (primary) hypertension: Secondary | ICD-10-CM | POA: Diagnosis not present

## 2021-10-18 DIAGNOSIS — K219 Gastro-esophageal reflux disease without esophagitis: Secondary | ICD-10-CM | POA: Diagnosis not present

## 2021-10-19 LAB — CBC WITH DIFFERENTIAL/PLATELET
Basophils Absolute: 0 10*3/uL (ref 0.0–0.2)
Basos: 1 %
EOS (ABSOLUTE): 0.2 10*3/uL (ref 0.0–0.4)
Eos: 2 %
Hematocrit: 41.2 % (ref 34.0–46.6)
Hemoglobin: 14.5 g/dL (ref 11.1–15.9)
Immature Grans (Abs): 0 10*3/uL (ref 0.0–0.1)
Immature Granulocytes: 0 %
Lymphocytes Absolute: 3.7 10*3/uL — ABNORMAL HIGH (ref 0.7–3.1)
Lymphs: 43 %
MCH: 31.5 pg (ref 26.6–33.0)
MCHC: 35.2 g/dL (ref 31.5–35.7)
MCV: 89 fL (ref 79–97)
Monocytes Absolute: 0.9 10*3/uL (ref 0.1–0.9)
Monocytes: 10 %
Neutrophils Absolute: 3.9 10*3/uL (ref 1.4–7.0)
Neutrophils: 44 %
Platelets: 249 10*3/uL (ref 150–450)
RBC: 4.61 x10E6/uL (ref 3.77–5.28)
RDW: 11.8 % (ref 11.7–15.4)
WBC: 8.7 10*3/uL (ref 3.4–10.8)

## 2021-10-19 LAB — LIPID PANEL
Chol/HDL Ratio: 2.4 ratio (ref 0.0–4.4)
Cholesterol, Total: 204 mg/dL — ABNORMAL HIGH (ref 100–199)
HDL: 84 mg/dL (ref >39)
LDL Chol Calc (NIH): 105 mg/dL — ABNORMAL HIGH (ref 0–99)
Triglycerides: 86 mg/dL (ref 0–149)
VLDL Cholesterol Cal: 15 mg/dL (ref 5–40)

## 2021-10-19 LAB — CMP14+EGFR
ALT: 15 IU/L (ref 0–32)
AST: 19 IU/L (ref 0–40)
Albumin/Globulin Ratio: 2.1 (ref 1.2–2.2)
Albumin: 4.5 g/dL (ref 3.5–4.6)
Alkaline Phosphatase: 39 IU/L — ABNORMAL LOW (ref 44–121)
BUN/Creatinine Ratio: 16 (ref 12–28)
BUN: 12 mg/dL (ref 10–36)
Bilirubin Total: 0.4 mg/dL (ref 0.0–1.2)
CO2: 25 mmol/L (ref 20–29)
Calcium: 10.1 mg/dL (ref 8.7–10.3)
Chloride: 97 mmol/L (ref 96–106)
Creatinine, Ser: 0.73 mg/dL (ref 0.57–1.00)
Globulin, Total: 2.1 g/dL (ref 1.5–4.5)
Glucose: 110 mg/dL — ABNORMAL HIGH (ref 70–99)
Potassium: 4.2 mmol/L (ref 3.5–5.2)
Sodium: 136 mmol/L (ref 134–144)
Total Protein: 6.6 g/dL (ref 6.0–8.5)
eGFR: 77 mL/min/{1.73_m2} (ref >59)

## 2021-10-19 LAB — VITAMIN D 25 HYDROXY (VIT D DEFICIENCY, FRACTURES): Vit D, 25-Hydroxy: 54.2 ng/mL (ref 30.0–100.0)

## 2021-10-19 LAB — TSH: TSH: 3.46 u[IU]/mL (ref 0.450–4.500)

## 2021-10-24 ENCOUNTER — Ambulatory Visit: Payer: Medicare Other | Attending: Family Medicine

## 2021-10-24 ENCOUNTER — Other Ambulatory Visit: Payer: Self-pay

## 2021-10-24 DIAGNOSIS — R5381 Other malaise: Secondary | ICD-10-CM | POA: Insufficient documentation

## 2021-10-24 DIAGNOSIS — M6281 Muscle weakness (generalized): Secondary | ICD-10-CM | POA: Diagnosis not present

## 2021-10-24 DIAGNOSIS — R260 Ataxic gait: Secondary | ICD-10-CM | POA: Diagnosis not present

## 2021-10-24 DIAGNOSIS — R262 Difficulty in walking, not elsewhere classified: Secondary | ICD-10-CM

## 2021-10-24 NOTE — Therapy (Addendum)
[facility] [place], [place], [address] Phone: [phone]   Fax:  [phone]  Physical Therapy Evaluation  Patient Details  Name: Kristi Sawyer MRN: [id number] Date of Birth: July 17, 1926 Referring Provider (PT): Dettinger   Encounter Date: 10/24/2021   PT End of Session - 10/24/21 1253     Visit Number 1    Number of Visits 18    Date for PT Re-Evaluation 01/20/22    PT Start Time 1300    PT Stop Time 1354    PT Time Calculation (min) 54 min    Activity Tolerance Patient tolerated treatment well    Behavior During Therapy [facility] for tasks assessed/performed             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 benign tumor     There were no vitals filed for this visit.    Subjective Assessment - 10/24/21 1253     Subjective Patient reports that she has been having trouble balancing and walking for over a year. She notes that it got worse about 3 weeks ago when she was in the hospital for a day due to dehydration. She then had home health physical therapy for about 2 weeks at twice per week. She has needed physical therapy previously for this same condition which  helped. She notes that she has been doing her HEP at home. She feels unsteady after transferring from sitting to standing so she has to stand still for a few seconds to get her balance.    Pertinent History HTN    Limitations Walking;Standing    How long can you walk comfortably? 10-15 minutes (grocery shopping)    Patient Stated Goals walk better and improve her balance    Currently in Pain? No/denies                [facility] PT Assessment - 10/24/21 0001       Assessment   Medical Diagnosis Physical deconditioning    Referring Provider (PT) Dettinger    Onset Date/Surgical Date --   over 1 year ago   Next MD Visit --   6 months from initial evaluation   Prior Therapy Yes      Precautions   Precautions None      Restrictions   Weight Bearing Restrictions No      Balance Screen   Has the patient fallen in the past 6 months No  Has the patient had a decrease in activity level because of a fear of falling?  No    Is the patient reluctant to leave their home because of a fear of falling?  No      Home Environment   Living Environment Private residence    Living Arrangements Alone    Type of [place] to enter    Entrance Stairs-Number of Steps 2    Entrance Stairs-Rails Can reach both    [place] One level    [place] - 2 wheels;Cane - single point   does not use this equipment     Prior Function   Level of Independence Independent    Vocation Retired    Leisure Read, walk around the block(has not done in the past month)      Cognition   Overall Cognitive Status Within Functional Limits for tasks assessed    Attention Focused    Focused Attention Appears intact    Memory Appears intact    Awareness Appears intact    Problem Solving Appears intact      Sensation   Additional Comments Patient reports no numbness or tingling      ROM / Strength   AROM / PROM / Strength Strength;AROM      AROM   Overall AROM  Within  functional limits for tasks performed      Strength   Strength Assessment Site Hip;Knee;Ankle    Right/Left Hip Right;Left    Right Hip Flexion 4-/5    Right Hip Extension 4-/5    Left Hip Flexion 3+/5    Right/Left Knee Right;Left    Right Knee Flexion 4/5    Right Knee Extension 4/5    Left Knee Flexion 4-/5    Left Knee Extension 4/5    Right/Left Ankle Right;Left    Right Ankle Dorsiflexion 3/5    Left Ankle Dorsiflexion 3/5      Transfers   Transfers Sit to Stand;Stand to Sit    Sit to Stand 6: Modified independent (Device/Increase time);Without upper extremity assist;Multiple attempts    Five time sit to stand comments  34 seconds    Stand to Sit 6: Modified independent (Device/Increase time);Without upper extremity assist;Uncontrolled descent      Ambulation/Gait   Ambulation/Gait Yes    Ambulation/Gait Assistance 6: Modified independent (Device/Increase time)    Assistive device None    Gait Pattern Shuffle;Poor foot clearance - left;Poor foot clearance - right;Decreased dorsiflexion - left;Decreased dorsiflexion - right;Decreased hip/knee flexion - left;Decreased hip/knee flexion - right;Decreased stride length    Ambulation Surface Level;Indoor    Gait velocity decreased      Balance   Balance Assessed Yes   [place]: 30 seconds (increased unsteadiness and sway, extended time to get into position): Semi-tandem: 30 seconds each (significant sway and unsteadiness "very hard")     Standardized Balance Assessment   Standardized Balance Assessment Timed Up and Go Test      Timed Up and Go Test   TUG Normal TUG    Normal TUG (seconds) 41    TUG Comments difficulty avoiding cone                        Objective measurements completed on examination: See above findings.       [facility] Adult PT Treatment/Exercise - 10/24/21 0001       Exercises   Exercises Knee/Hip  Knee/Hip Exercises: Aerobic   Nustep L3 x 5 minutes                           PT Long Term Goals - 10/24/21 1255       PT LONG TERM GOAL #1   Title Patient will be independent with her HEP.    Time 6    Period Weeks    Status New    Target Date 12/05/21      PT LONG TERM GOAL #2   Title Patient will improve her TUG time from 41 seconds to 21 seconds for improved functional mobility and reduce her risk of falling.    Time 6    Period Weeks    Status New    Target Date 12/05/21      PT LONG TERM GOAL #3   Title Patient will be able to improve her 5x sit to stand time from 34 seconds to 20 seconds for improved lower extremity power needed for her functional activities.    Time 6    Period Weeks    Status New    Target Date 12/05/21                    Plan - 10/24/21 1254     Clinical Impression Statement Patient is a 87 year old female presenting to physical therapy with deconditioning and gait deviations. She is at an elevated risk of falls as evidenced by her lower extremity strength, TUG time, and five time sit to stand time. She also exhibited increased sway with today's balance assessment and she reported feeling very unsteady. She requires physical therapy to address her impairments to improve her safety and reduce her fall risk at home.    Personal Factors and Comorbidities Comorbidity 1;Age;Time since onset of injury/illness/exacerbation;Transportation;Other;Comorbidity 2;Comorbidity 3+    Comorbidities HTN, neuropathy, cataracts, osteoporosis,    Examination-Activity Limitations Locomotion Level;Transfers;Stand    Examination-Participation Restrictions Community Activity;Shop    Stability/Clinical Decision Making Evolving/Moderate complexity    Clinical Decision Making Moderate    Rehab Potential Good    PT Frequency 3x / week   2-3x/ week   PT Duration 6 weeks    PT Treatment/Interventions Gait training;Stair training;Functional mobility training;Therapeutic activities;Therapeutic exercise;Balance  training;Neuromuscular re-education;Patient/family education;Energy conservation    PT Next Visit Plan nustep, balance activities, and lower extremity strengthening    Consulted and Agree with Plan of Care Patient             Patient will benefit from skilled therapeutic intervention in order to improve the following deficits and impairments:  Abnormal gait, Difficulty walking, Decreased endurance, Decreased activity tolerance, Decreased balance, Decreased strength  Visit Diagnosis: Muscle weakness (generalized)  Difficulty in walking, not elsewhere classified     Problem List Patient Active Problem List   Diagnosis Date Noted   Ataxic gait    Physical deconditioning    Right epiretinal membrane 01/26/2021   Left epiretinal membrane 01/26/2021   Posterior vitreous detachment of left eye 01/26/2021   Osteoporosis without current pathological fracture 11/23/2020   Hypertension 10/28/2017   Facial contusion, initial encounter    Generalized weakness 10/26/2017   Overactive bladder 06/18/2015   Hyperlipidemia 09/26/2010   ANXIETY 09/26/2010   GERD (gastroesophageal reflux disease) 09/26/2010   CELIAC SPRUE 09/26/2010    Darlin Coco, PT 10/24/2021, 2:24 PM  [facility] [address] [place], [place], [address] Phone: [phone]  Fax:  [phone]  Name: Kristi Sawyer MRN: [id number] Date of Birth: 1926-10-04  PHYSICAL THERAPY DISCHARGE SUMMARY  Visits from Start of Care: 1  Current functional level related to goals / functional outcomes: Patient called after her initial evaluation discontinue physical therapy due to financial reasons.    Remaining deficits: No change from initial evaluation   Education / Equipment: HEP   Patient agrees to discharge. Patient goals were not met. Patient is being discharged due to financial reasons.

## 2021-10-26 ENCOUNTER — Ambulatory Visit: Payer: Medicare Other | Admitting: Family Medicine

## 2021-10-31 ENCOUNTER — Ambulatory Visit: Payer: Medicare Other | Admitting: Dermatology

## 2021-11-09 ENCOUNTER — Other Ambulatory Visit: Payer: Self-pay | Admitting: Internal Medicine

## 2021-11-12 ENCOUNTER — Other Ambulatory Visit: Payer: Self-pay | Admitting: Family Medicine

## 2021-11-12 DIAGNOSIS — E782 Mixed hyperlipidemia: Secondary | ICD-10-CM

## 2021-11-30 ENCOUNTER — Other Ambulatory Visit: Payer: Self-pay | Admitting: Internal Medicine

## 2021-12-08 ENCOUNTER — Ambulatory Visit (INDEPENDENT_AMBULATORY_CARE_PROVIDER_SITE_OTHER): Payer: Medicare Other | Admitting: Family Medicine

## 2021-12-08 ENCOUNTER — Encounter: Payer: Self-pay | Admitting: Family Medicine

## 2021-12-08 VITALS — BP 153/78 | HR 76 | Ht 60.0 in | Wt 97.0 lb

## 2021-12-08 DIAGNOSIS — M5430 Sciatica, unspecified side: Secondary | ICD-10-CM | POA: Diagnosis not present

## 2021-12-08 DIAGNOSIS — M5442 Lumbago with sciatica, left side: Secondary | ICD-10-CM | POA: Diagnosis not present

## 2021-12-08 MED ORDER — METHYLPREDNISOLONE ACETATE 40 MG/ML IJ SUSP
80.0000 mg | Freq: Once | INTRAMUSCULAR | Status: AC
Start: 2021-12-08 — End: 2021-12-08
  Administered 2021-12-08: 80 mg via INTRAMUSCULAR

## 2021-12-08 NOTE — Progress Notes (Signed)
BP (!) 153/78    Pulse 76    Ht 5' (1.524 m)    Wt 97 lb (44 kg)    SpO2 98%    BMI 18.94 kg/m    Subjective:   Patient ID: Kristi Sawyer, female    DOB: 05/04/1926, 88 y.o.   MRN: [id number]  HPI: Kristi Sawyer is a 88 y.o. female presenting on 12/08/2021 for Back Pain (Lower back) and Hip Pain (right)   HPI Hip pain and low back pain Patient is coming in today with low back pain and left leg pain, the pain is bilateral lower back around her waistline but that shoots down the back of her left leg and sometimes into her left calf.  She has fought this off and on a little bit that its been there constantly for years but over the past day it has significantly worsened with the weather changes.  She denies any falls.  She just is concerned that it is flared up.  She has been taking ibuprofen and it usually helps but it is not helping as much right now.  She said yesterday is the worst that for quite some time.  Relevant past medical, surgical, family and social history reviewed and updated as indicated. Interim medical history since our last visit reviewed. Allergies and medications reviewed and updated.  Review of Systems  Constitutional:  Negative for chills and fever.  Eyes:  Negative for visual disturbance.  Respiratory:  Negative for chest tightness and shortness of breath.   Cardiovascular:  Negative for chest pain and leg swelling.  Musculoskeletal:  Positive for arthralgias and back pain. Negative for gait problem, joint swelling and myalgias.  Skin:  Negative for color change and rash.  Psychiatric/Behavioral:  Negative for agitation and behavioral problems.   All other systems reviewed and are negative.  Per HPI unless specifically indicated above   Allergies as of 12/08/2021       Reactions   Prevnar 13 [pneumococcal 13-val Conj Vacc] Hives   Red/streaking arm   Penicillins Hives   Has patient had a PCN reaction causing immediate rash, facial/tongue/throat swelling, SOB or  lightheadedness with hypotension: No Has patient had a PCN reaction causing severe rash involving mucus membranes or skin necrosis: No Has patient had a PCN reaction that required hospitalization: No Has patient had a PCN reaction occurring within the last 10 years: No If all of the above answers are "NO", then may proceed with Cephalosporin use.   Tetanus Toxoids Other (See Comments)   Area of injection was red and streaked.   Codeine Nausea Only   Sulfonamide Derivatives Nausea Only        Medication List        Accurate as of December 08, 2021 11:24 AM. If you have any questions, ask your nurse or doctor.          STOP taking these medications    meclizine 12.5 MG tablet Commonly known as: ANTIVERT Stopped by: Fransisca Kaufmann Cleo Santucci, MD       TAKE these medications    amLODipine 5 MG tablet Commonly known as: NORVASC Take 1 tablet (5 mg total) by mouth daily.   aspirin 81 MG EC tablet Take 81 mg by mouth daily.   CALTRATE PLUS PO Take 1 tablet by mouth daily.   CENTRUM SILVER PO Take 1 tablet by mouth daily.   denosumab 60 MG/ML Sosy injection Commonly known as: PROLIA Inject 60 mg into the skin every  6 (six) months.   Fish Oil 1000 MG Caps Take 1 capsule by mouth 3 (three) times daily.   hydrALAZINE 10 MG tablet Commonly known as: APRESOLINE Take 1 tablet (10 mg total) by mouth every 8 (eight) hours.   Integra 62.5-62.5-40-3 MG Caps TAKE 1 CAPSULE BY MOUTH EVERY DAY   Inulin 1.5 g Chew Chew 5 tablets by mouth daily. FIBER CHOICE   metoCLOPramide 10 MG tablet Commonly known as: REGLAN TAKE 1 TABLET BY MOUTH EVERYDAY AT BEDTIME   mirabegron ER 50 MG Tb24 tablet Commonly known as: Myrbetriq Take 1 tablet (50 mg total) by mouth daily.   omeprazole 20 MG capsule Commonly known as: PRILOSEC Take 1-2 capsules (20-40 mg total) by mouth daily.   rosuvastatin 5 MG tablet Commonly known as: CRESTOR TAKE 1 TABLET BY MOUTH ON MONDAY, WEDNESDAY, AND  FRIDAY   Vitamin D 50 MCG (2000 UT) Caps Take by mouth as directed. Take 1 tablet daily except none on saturdays and sundays         Objective:   BP (!) 153/78    Pulse 76    Ht 5' (1.524 m)    Wt 97 lb (44 kg)    SpO2 98%    BMI 18.94 kg/m   Wt Readings from Last 3 Encounters:  12/08/21 97 lb (44 kg)  10/17/21 94 lb (42.6 kg)  09/21/21 95 lb (43.1 kg)    Physical Exam Vitals and nursing note reviewed.  Constitutional:      General: She is not in acute distress.    Appearance: She is well-developed. She is not diaphoretic.  Eyes:     Conjunctiva/sclera: Conjunctivae normal.  Musculoskeletal:        General: Normal range of motion.     Lumbar back: Tenderness present. No deformity or bony tenderness. Normal range of motion. Negative right straight leg raise test and negative left straight leg raise test.       Back:  Skin:    General: Skin is warm and dry.     Findings: No rash.  Neurological:     Mental Status: She is alert and oriented to person, place, and time.     Coordination: Coordination normal.  Psychiatric:        Behavior: Behavior normal.      Assessment & Plan:   Problem List Items Addressed This Visit   None Visit Diagnoses     Sciatic leg pain    -  Primary   Relevant Medications   methylPREDNISolone acetate (DEPO-MEDROL) injection 80 mg (Start on 12/08/2021 11:30 AM)   Acute bilateral low back pain with left-sided sciatica       Relevant Medications   methylPREDNISolone acetate (DEPO-MEDROL) injection 80 mg (Start on 12/08/2021 11:30 AM)       We will do steroid injection today to see if we can calm it down and then she is to let us know if its not improving or if it is worsening. Follow up plan: Return if symptoms worsen or fail to improve.  Counseling provided for all of the vaccine components No orders of the defined types were placed in this encounter.   [name], MD [facility] 12/08/2021, 11:24  AM

## 2021-12-14 ENCOUNTER — Encounter: Payer: Self-pay | Admitting: Family Medicine

## 2021-12-14 ENCOUNTER — Ambulatory Visit (INDEPENDENT_AMBULATORY_CARE_PROVIDER_SITE_OTHER): Payer: Medicare Other | Admitting: Family Medicine

## 2021-12-14 ENCOUNTER — Ambulatory Visit (INDEPENDENT_AMBULATORY_CARE_PROVIDER_SITE_OTHER): Payer: Medicare Other

## 2021-12-14 VITALS — Wt 97.0 lb

## 2021-12-14 VITALS — BP 139/77 | HR 79 | Temp 98.1°F | Ht <= 58 in | Wt 96.0 lb

## 2021-12-14 DIAGNOSIS — G8929 Other chronic pain: Secondary | ICD-10-CM

## 2021-12-14 DIAGNOSIS — Z0000 Encounter for general adult medical examination without abnormal findings: Secondary | ICD-10-CM

## 2021-12-14 DIAGNOSIS — M810 Age-related osteoporosis without current pathological fracture: Secondary | ICD-10-CM

## 2021-12-14 DIAGNOSIS — M545 Low back pain, unspecified: Secondary | ICD-10-CM | POA: Diagnosis not present

## 2021-12-14 MED ORDER — KETOROLAC TROMETHAMINE 30 MG/ML IJ SOLN
30.0000 mg | Freq: Once | INTRAMUSCULAR | Status: AC
  Administered 2021-12-14: 30 mg via INTRAMUSCULAR

## 2021-12-14 NOTE — Progress Notes (Signed)
Subjective:   Kristi Sawyer is a 88 y.o. female who presents for Medicare Annual (Subsequent) preventive examination.  Virtual Visit via Telephone Note  I connected with  [name] on 12/14/21 at 12:00 PM EST by telephone and verified that I am speaking with the correct person using two identifiers.  Location: Patient: Home Provider: [facility] Persons participating in the virtual visit: patient/Nurse Health Advisor   I discussed the limitations, risks, security and privacy concerns of performing an evaluation and management service by telephone and the availability of in person appointments. The patient expressed understanding and agreed to proceed.  Interactive audio and video telecommunications were attempted between this nurse and patient, however failed, due to patient having technical difficulties OR patient did not have access to video capability.  We continued and completed visit with audio only.  Some vital signs may be absent or patient reported.   Karalyne Nusser E Paticia Moster, LPN   Review of Systems     Cardiac Risk Factors include: advanced age (>26mn, >>51women);dyslipidemia;hypertension;sedentary lifestyle     Objective:    Today's Vitals   12/14/21 1206  Weight: 97 lb (44 kg)   Body mass index is 18.94 kg/m.  Advanced Directives 12/14/2021 10/24/2021 09/09/2021 12/07/2020 05/05/2019 05/01/2018 02/01/2018  Does Patient Have a Medical Advance Directive? Yes Yes No Yes No No No  Type of [place]amed[place] will - - [place] - - -  Does patient want to make changes to medical advance directive? - - - No - Patient declined - - -  Copy of [place] Chart? No - copy requested - - - - - -  Would patient like information on creating a medical advance directive? - - No - Patient declined - No - Patient declined No - Patient declined No - Patient declined    Current Medications (verified) Outpatient  Encounter Medications as of 12/14/2021  Medication Sig   amLODipine (NORVASC) 5 MG tablet Take 1 tablet (5 mg total) by mouth daily.   aspirin 81 MG EC tablet Take 81 mg by mouth daily.   Calcium Carbonate-Vit D-Min (CALTRATE PLUS PO) Take 1 tablet by mouth daily.   Cholecalciferol (VITAMIN D) 2000 UNITS CAPS Take by mouth as directed. Take 1 tablet daily except none on saturdays and sundays   denosumab (PROLIA) 60 MG/ML SOSY injection Inject 60 mg into the skin every 6 (six) months.   Fe Fum-FePoly-Vit C-Vit B3 (INTEGRA) 62.5-62.5-40-3 MG CAPS TAKE 1 CAPSULE BY MOUTH EVERY DAY   hydrALAZINE (APRESOLINE) 10 MG tablet Take 1 tablet (10 mg total) by mouth every 8 (eight) hours.   Inulin 1.5 g CHEW Chew 5 tablets by mouth daily. FIBER CHOICE   metoCLOPramide (REGLAN) 10 MG tablet TAKE 1 TABLET BY MOUTH EVERYDAY AT BEDTIME   mirabegron ER (MYRBETRIQ) 50 MG TB24 tablet Take 1 tablet (50 mg total) by mouth daily.   Multiple Vitamins-Minerals (CENTRUM SILVER PO) Take 1 tablet by mouth daily.   Omega-3 Fatty Acids (FISH OIL) 1000 MG CAPS Take 1 capsule by mouth 3 (three) times daily.    omeprazole (PRILOSEC) 20 MG capsule Take 1-2 capsules (20-40 mg total) by mouth daily.   rosuvastatin (CRESTOR) 5 MG tablet TAKE 1 TABLET BY MOUTH ON MONDAY, WEDNESDAY, AND FRIDAY   No facility-administered encounter medications on file as of 12/14/2021.    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 benign tumor    Family History  Problem Relation Age of Onset   Healthy Mother    Lung cancer Father    Colon cancer Neg Hx    Stomach cancer Neg Hx    Esophageal cancer Neg Hx    Social History   Socioeconomic History   Marital status: Widowed    Spouse name: Not on file   Number of children: 0   Years of education: 16   Highest education level: Bachelor's degree (e.g., BA, AB, BS)  Occupational History   Occupation: retired    Fish farm manager: RETIRED  Tobacco Use   Smoking status: Never   Smokeless tobacco: Never  Vaping Use   Vaping Use: Never used  Substance and Sexual Activity   Alcohol use: No   Drug use: No   Sexual activity: Not Currently  Other Topics Concern   Not on file  Social History Narrative   WidowNo 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 - and then [employer] x 15 yrs - retiredWalks daily - exercise   Close with her large church family   Still driving and very independent   Social Determinants of Radio broadcast assistant Strain: Low Risk    Difficulty of Paying Living Expenses: Not very hard  Food Insecurity: No Food Insecurity   Worried About Charity fundraiser in the Last Year: Never true   [place] in the Last Year: Never true  Transportation Needs: No Transportation Needs   Lack of Transportation (Medical): No   Lack of Transportation (Non-Medical): No  Physical Activity: Insufficiently Active   Days of Exercise per Week: 5 days   Minutes of Exercise per Session: 20 min  Stress: No Stress Concern Present   Feeling of Stress : Only a little  Social Connections:  Moderately Integrated   Frequency of Communication with Friends and Family: More than three times a week   Frequency of Social Gatherings with Friends and Family: More than three times a week   Attends Religious Services: More than 4 times per year   Active Member of [employer] or Organizations: Yes   Attends Archivist Meetings: More than 4 times per year   Marital Status: Widowed    Tobacco Counseling Counseling given: Not Answered   Clinical Intake:  Pre-visit preparation completed: Yes  Pain : No/denies pain     BMI - recorded: 18.94  Nutritional Status: BMI <19  Underweight Nutritional Risks: Unintentional weight loss Diabetes: No  How often do you need to have someone help you when you read instructions, pamphlets, or other written materials from your doctor or pharmacy?: 1 - Never  Diabetic? no  Interpreter Needed?: No  Information entered by :: Jacinto Keil, LPN   Activities of Daily Living In your present state of health, do you have any difficulty performing the following activities: 12/14/2021  Hearing? N  Vision? N  Difficulty concentrating or making decisions? N  Walking or climbing stairs? Y  Dressing or bathing? N  Doing errands, shopping? N  Preparing Food and eating ? N  Using the Toilet? N  In the past six months, have you accidently leaked urine? Y  Comment takes rx med - wears pads for protection  Do you have problems with loss of bowel control? N  Managing your Medications? N  Managing your Finances? N  Housekeeping or managing your Housekeeping? N  Some recent data might be hidden    Patient Care Team: Dettinger, Fransisca Kaufmann, MD as PCP - General (Family Medicine) Gatha Mayer, MD as Consulting Physician (Gastroenterology) Zadie Rhine Clent Demark, MD as Consulting Physician (Ophthalmology) Ilean China, RN as Registered Nurse Irine Seal, MD as Attending Physician (Urology) Allyn Kenner, MD (Dermatology) Celestia Khat, OD  (Optometry)  Indicate any recent Medical Services you may have received from other than [facility] providers in the past year (date may be approximate).     Assessment:   This is a routine wellness examination for Mirjana.  Hearing/Vision screen Hearing Screening - Comments:: Denies hearing difficulties  Vision Screening - Comments:: Wears rx glasses - up to date with annual eye exams with [facility]  Dietary issues and exercise activities discussed: Current Exercise Habits: Home exercise routine, Type of exercise: walking, Time (Minutes): 20, Frequency (Times/Week): 4, Weekly Exercise (Minutes/Week): 80, Intensity: Mild, Exercise limited by: orthopedic condition(s)   Goals Addressed             This Visit's Progress    DIET - INCREASE LEAN PROTEINS   On track    Exercise 150 minutes per week (moderate activity)   On track    Great job - keep up daily walking       Depression Screen PHQ 2/9 Scores 12/14/2021 12/08/2021 10/17/2021 09/21/2021 04/20/2021 12/07/2020 10/20/2020  PHQ - 2 Score 0 0 0 0 0 0 0  PHQ- 9 Score 0 0 0 - - - -    Fall Risk Fall Risk  12/14/2021 12/08/2021 10/17/2021 09/21/2021 04/20/2021  Falls in the past year? 0 0 0 0 0  Comment - - - - -  Number falls in past yr: 0 - - - -  Injury with Fall? 0 - - - -  Risk Factor Category  - - - - -  Risk for fall due to : History of fall(s) - - - -  Follow up - - - - -    FALL [place]:  Any stairs in or around the home? No  If so, are there any without handrails? No  Home free of loose throw rugs in walkways, pet beds, electrical cords, etc? Yes  Adequate lighting in your home to reduce risk of falls? Yes   ASSISTIVE DEVICES UTILIZED TO PREVENT FALLS:  Life alert? Yes  Use of a cane, walker or w/c? Yes  Grab bars in the bathroom? Yes  Shower chair or bench in  shower? Yes  Elevated toilet seat or a handicapped toilet? No   TIMED UP AND GO:  Was the test performed? No . Telephonic  visit  Cognitive Function: MMSE - Mini Mental State Exam 05/01/2018 04/16/2017 04/14/2016 04/02/2015  Orientation to time 5 5 5 5   Orientation to Place 5 5 5 5   Registration 3 - 3 3  Attention/ Calculation 4 3 5 4   Recall 2 - 3 2  Language- name 2 objects 2 2 2 2   Language- repeat 1 1 1 1   Language- follow 3 step command 2 3 3 3   Language- read & follow direction 1 1 1 1   Write a sentence 1 1 1 1   Copy design 1 1 1 1   Total score 27 - 30 28     6CIT Screen 12/14/2021 12/07/2020 05/05/2019  What Year? 0 points 0 points 0 points  What month? 0 points 0 points 0 points  What time? 0 points 0 points 0 points  Count back from 20 0 points 0 points 0 points  Months in reverse 0 points 4 points 0 points  Repeat phrase 4 points 4 points 0 points  Total Score 4 8 0    Immunizations Immunization History  Administered Date(s) Administered   Fluad Quad(high Dose 65+) 07/14/2019, 08/18/2020, 08/26/2021   Influenza, High Dose Seasonal PF 08/07/2017, 09/11/2018   Influenza,inj,Quad PF,6+ Mos 08/04/2013, 08/18/2014, 09/16/2015, 08/22/2016   Influenza-Unspecified 08/11/2009, 08/04/2010, 09/13/2011, 09/16/2015   PFIZER(Purple Top)SARS-COV-2 Vaccination 12/05/2019, 12/26/2019, 03/25/2020, 04/11/2021, 08/12/2021   Pneumococcal Conjugate-13 11/05/2013   Pneumococcal Polysaccharide-23 11/14/1995   Tdap 06/03/2013   Zoster Recombinat (Shingrix) 04/20/2021   Zoster, Live 12/27/2010    TDAP status: Up to date  Flu Vaccine status: Up to date  Pneumococcal vaccine status: Up to date  Covid-19 vaccine status: Completed vaccines  Qualifies for Shingles Vaccine? Yes   Zostavax completed Yes   Shingrix Completed?: No.    Education has been provided regarding the importance of this vaccine. Patient has been advised to call insurance company to determine out of pocket expense if they have not yet received this vaccine. Advised may also receive vaccine at local pharmacy or Health Dept. Verbalized acceptance  and understanding.  Screening Tests Health Maintenance  Topic Date Due   Zoster Vaccines- Shingrix (2 of 2) 12/22/2021 (Originally 06/15/2021)   MAMMOGRAM  05/17/2022   DEXA SCAN  10/21/2022   TETANUS/TDAP  06/04/2023   Pneumonia Vaccine 12+ Years old  Completed   INFLUENZA VACCINE  Completed   COVID-19 Vaccine  Completed   HPV VACCINES  Aged Out    Health Maintenance  There are no preventive care reminders to display for this patient.   Colorectal cancer screening: No longer required.   Mammogram status: Completed 05/19/2021. Repeat every year  Bone Density status: Completed 12/23/2019. Results reflect: Bone density results: OSTEOPOROSIS. Repeat every 2 years.  Lung Cancer Screening: (Low Dose CT Chest recommended if Age 45-80 years, 30 pack-year currently smoking OR have quit w/in 15years.) does not qualify  Additional Screening:  Hepatitis C Screening: does not qualify  Vision Screening: Recommended annual ophthalmology exams for early detection of glaucoma and other disorders of the eye. Is the patient up to date with their annual eye exam?  Yes  Who is the provider or what is the name of the office in which the patient attends annual eye exams? [place] If pt is not established with a provider, would they like to be referred to a provider to  establish care? No .   Dental Screening: Recommended annual dental exams for proper oral hygiene  Community Resource Referral / Chronic Care Management: CRR required this visit?  No   CCM required this visit?  No      Plan:     I have personally reviewed and noted the following in the patients chart:   Medical and social history Use of alcohol, tobacco or illicit drugs  Current medications and supplements including opioid prescriptions.  Functional ability and status Nutritional status Physical activity Advanced directives List of other physicians Hospitalizations, surgeries, and ER visits in previous 12  months Vitals Screenings to include cognitive, depression, and falls Referrals and appointments  In addition, I have reviewed and discussed with patient certain preventive protocols, quality metrics, and best practice recommendations. A written personalized care plan for preventive services as well as general preventive health recommendations were provided to patient.     Sandrea Hammond, LPN   06/16/7840   Nurse Notes: None

## 2021-12-14 NOTE — Patient Instructions (Addendum)
Kristi Sawyer , Thank you for taking time to come for your Medicare Wellness Visit. I appreciate your ongoing commitment to your health goals. Please review the following plan we discussed and let me know if I can assist you in the future.   Screening recommendations/referrals: Colonoscopy: no longer required Mammogram: Done 05/19/2021 - Repeat annually  Bone Density: Done 10/21/2020 - Repeat every 2 years   Recommended yearly ophthalmology/optometry visit for glaucoma screening and checkup Recommended yearly dental visit for hygiene and checkup  Vaccinations: Influenza vaccine: Done 08/26/2021 - Repeat annually  Pneumococcal vaccine: Done 1997 & 11/05/2013 Tdap vaccine: Done 06/03/2013 - Repeat in 10 years Shingles vaccine: Zostavax Done 12/27/2010 ; Shingrix done 04/20/2021 - get second dose soon Covid-19: Done 12/05/2019,12/26/2019, 03/25/2020, 04/11/2021, & 08/12/2021  Advanced directives: Please bring a copy of your health care power of attorney and living will to the office to be added to your chart at your convenience.   Conditions/risks identified: Keep up the great work! Continue walking and eating healthy - aim for 3 full meals per day plus healthy snacks.  Next appointment: Follow up in one year for your annual wellness visit    Preventive Care 65 Years and Older, Female Preventive care refers to lifestyle choices and visits with your health care provider that can promote health and wellness. What does preventive care include? A yearly physical exam. This is also called an annual well check. Dental exams once or twice a year. Routine eye exams. Ask your health care provider how often you should have your eyes checked. Personal lifestyle choices, including: Daily care of your teeth and gums. Regular physical activity. Eating a healthy diet. Avoiding tobacco and drug use. Limiting alcohol use. Practicing safe sex. Taking low-dose aspirin every day. Taking vitamin and mineral supplements  as recommended by your health care provider. What happens during an annual well check? The services and screenings done by your health care provider during your annual well check will depend on your age, overall health, lifestyle risk factors, and family history of disease. Counseling  Your health care provider may ask you questions about your: Alcohol use. Tobacco use. Drug use. Emotional well-being. Home and relationship well-being. Sexual activity. Eating habits. History of falls. Memory and ability to understand (cognition). Work and work Statistician. Reproductive health. Screening  You may have the following tests or measurements: Height, weight, and BMI. Blood pressure. Lipid and cholesterol levels. These may be checked every 5 years, or more frequently if you are over 47 years old. Skin check. Lung cancer screening. You may have this screening every year starting at age 75 if you have a 30-pack-year history of smoking and currently smoke or have quit within the past 15 years. Fecal occult blood test (FOBT) of the stool. You may have this test every year starting at age 20. Flexible sigmoidoscopy or colonoscopy. You may have a sigmoidoscopy every 5 years or a colonoscopy every 10 years starting at age 39. Hepatitis C blood test. Hepatitis B blood test. Sexually transmitted disease (STD) testing. Diabetes screening. This is done by checking your blood sugar (glucose) after you have not eaten for a while (fasting). You may have this done every 1-3 years. Bone density scan. This is done to screen for osteoporosis. You may have this done starting at age 20. Mammogram. This may be done every 1-2 years. Talk to your health care provider about how often you should have regular mammograms. Talk with your health care provider about your test results,  treatment options, and if necessary, the need for more tests. Vaccines  Your health care provider may recommend certain vaccines, such  as: Influenza vaccine. This is recommended every year. Tetanus, diphtheria, and acellular pertussis (Tdap, Td) vaccine. You may need a Td booster every 10 years. Zoster vaccine. You may need this after age 78. Pneumococcal 13-valent conjugate (PCV13) vaccine. One dose is recommended after age 34. Pneumococcal polysaccharide (PPSV23) vaccine. One dose is recommended after age 24. Talk to your health care provider about which screenings and vaccines you need and how often you need them. This information is not intended to replace advice given to you by your health care provider. Make sure you discuss any questions you have with your health care provider. Document Released: 11/26/2015 Document Revised: 07/19/2016 Document Reviewed: 08/31/2015 Elsevier Interactive Patient Education  2017 [place] Prevention in the Home Falls can cause injuries. They can happen to people of all ages. There are many things you can do to make your home safe and to help prevent falls. What can I do on the outside of my home? Regularly fix the edges of walkways and driveways and fix any cracks. Remove anything that might make you trip as you walk through a door, such as a raised step or threshold. Trim any bushes or trees on the path to your home. Use bright outdoor lighting. Clear any walking paths of anything that might make someone trip, such as rocks or tools. Regularly check to see if handrails are loose or broken. Make sure that both sides of any steps have handrails. Any raised decks and porches should have guardrails on the edges. Have any leaves, snow, or ice cleared regularly. Use sand or salt on walking paths during winter. Clean up any spills in your garage right away. This includes oil or grease spills. What can I do in the bathroom? Use night lights. Install grab bars by the toilet and in the tub and shower. Do not use towel bars as grab bars. Use non-skid mats or decals in the tub or  shower. If you need to sit down in the shower, use a plastic, non-slip stool. Keep the floor dry. Clean up any water that spills on the floor as soon as it happens. Remove soap buildup in the tub or shower regularly. Attach bath mats securely with double-sided non-slip rug tape. Do not have throw rugs and other things on the floor that can make you trip. What can I do in the bedroom? Use night lights. Make sure that you have a light by your bed that is easy to reach. Do not use any sheets or blankets that are too big for your bed. They should not hang down onto the floor. Have a firm chair that has side arms. You can use this for support while you get dressed. Do not have throw rugs and other things on the floor that can make you trip. What can I do in the kitchen? Clean up any spills right away. Avoid walking on wet floors. Keep items that you use a lot in easy-to-reach places. If you need to reach something above you, use a strong step stool that has a grab bar. Keep electrical cords out of the way. Do not use floor polish or wax that makes floors slippery. If you must use wax, use non-skid floor wax. Do not have throw rugs and other things on the floor that can make you trip. What can I do with my stairs? Do  not leave any items on the stairs. Make sure that there are handrails on both sides of the stairs and use them. Fix handrails that are broken or loose. Make sure that handrails are as long as the stairways. Check any carpeting to make sure that it is firmly attached to the stairs. Fix any carpet that is loose or worn. Avoid having throw rugs at the top or bottom of the stairs. If you do have throw rugs, attach them to the floor with carpet tape. Make sure that you have a light switch at the top of the stairs and the bottom of the stairs. If you do not have them, ask someone to add them for you. What else can I do to help prevent falls? Wear shoes that: Do not have high heels. Have  rubber bottoms. Are comfortable and fit you well. Are closed at the toe. Do not wear sandals. If you use a stepladder: Make sure that it is fully opened. Do not climb a closed stepladder. Make sure that both sides of the stepladder are locked into place. Ask someone to hold it for you, if possible. Clearly mark and make sure that you can see: Any grab bars or handrails. First and last steps. Where the edge of each step is. Use tools that help you move around (mobility aids) if they are needed. These include: Canes. Walkers. Scooters. Crutches. Turn on the lights when you go into a dark area. Replace any light bulbs as soon as they burn out. Set up your furniture so you have a clear path. Avoid moving your furniture around. If any of your floors are uneven, fix them. If there are any pets around you, be aware of where they are. Review your medicines with your doctor. Some medicines can make you feel dizzy. This can increase your chance of falling. Ask your doctor what other things that you can do to help prevent falls. This information is not intended to replace advice given to you by your health care provider. Make sure you discuss any questions you have with your health care provider. Document Released: 08/26/2009 Document Revised: 04/06/2016 Document Reviewed: 12/04/2014 Elsevier Interactive Patient Education  2017 [employer].

## 2021-12-14 NOTE — Progress Notes (Addendum)
Subjective:  Patient ID: Kristi Sawyer, female    DOB: 05/03/1926, 88 y.o.   MRN: [id number]  Patient Care Team: Dettinger, Fransisca Kaufmann, MD as PCP - General (Family Medicine) Gatha Mayer, MD as Consulting Physician (Gastroenterology) Zadie Rhine Clent Demark, MD as Consulting Physician (Ophthalmology) Ilean China, RN as Registered Nurse Irine Seal, MD as Attending Physician (Urology) Allyn Kenner, MD (Dermatology) Celestia Khat, [place] (Optometry)   Chief Complaint:  Back Pain   HPI: Kristi Sawyer is a 88 y.o. female presenting on 12/14/2021 for Back Pain   Pt returns to the office for back pain. She was treated on 12/08/21 with steroid injections for lower back pain. She describes bursts of sharp pain in the lower back. The pain does not radiate down her legs. She denies loss of control of bladder or stool, or numbness of the perineum. She has been taking iburopfen at home with moderate relief. She states tylenol has historically not helped her.   Back Pain Pertinent negatives include no abdominal pain, bladder incontinence, bowel incontinence, chest pain, dysuria, fever, headaches, leg pain, numbness, paresis, paresthesias, pelvic pain, perianal numbness, tingling, weakness or weight loss.  There are no diagnoses linked to this encounter.    Relevant past medical, surgical, family, and social history reviewed and updated as indicated.  Allergies and medications reviewed and updated. Data reviewed: Chart in Epic.   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 - and then [employer] x 15 yrs - retiredWalks daily - exercise   Close with her large church family   Still driving and very independent   Social Determinants of Health   Financial Resource Strain: Low Risk    Difficulty of Paying  Living Expenses: Not very hard  Food Insecurity: No Food Insecurity   Worried About [place] in the Last Year: Never true   Ran Out of Food in the Last Year: Never true  Transportation Needs: No Transportation Needs   Lack of Transportation (Medical): No   Lack of Transportation (Non-Medical): No  Physical Activity: Insufficiently Active   Days of Exercise per Week: 5 days   Minutes of Exercise per Session: 20 min  Stress: No Stress Concern Present   Feeling of Stress : Only a little  Social Connections: Moderately Integrated   Frequency of Communication with Friends and Family: More than three times a week   Frequency of Social Gatherings with Friends and Family: More than three times a week   Attends Religious Services: More  than 4 times per year   Active Member of [employer] or Organizations: Yes   Attends Archivist Meetings: More than 4 times per year   Marital Status: Widowed  Intimate Partner Violence: Not At Risk   Fear of Current or Ex-Partner: No   Emotionally Abused: No   Physically Abused: No   Sexually Abused: No    Outpatient Encounter Medications as of 12/14/2021  Medication Sig   amLODipine (NORVASC) 5 MG tablet Take 1 tablet (5 mg total) by mouth daily.   aspirin 81 MG EC tablet Take 81 mg by mouth daily.   Calcium Carbonate-Vit D-Min (CALTRATE PLUS PO) Take 1 tablet by mouth daily.   Cholecalciferol (VITAMIN D) 2000 UNITS CAPS Take by mouth as directed. Take 1 tablet daily except none on saturdays and sundays   denosumab (PROLIA) 60 MG/ML SOSY injection Inject 60 mg into the skin every 6 (six) months.   Fe Fum-FePoly-Vit C-Vit B3 (INTEGRA) 62.5-62.5-40-3 MG CAPS TAKE 1 CAPSULE BY MOUTH EVERY DAY   hydrALAZINE (APRESOLINE) 10 MG tablet Take 1 tablet (10 mg total) by mouth every 8 (eight) hours.   Inulin 1.5 g CHEW Chew 5 tablets by mouth daily. FIBER CHOICE   metoCLOPramide (REGLAN) 10 MG tablet TAKE 1 TABLET BY MOUTH EVERYDAY AT BEDTIME   mirabegron ER (MYRBETRIQ) 50 MG TB24 tablet Take 1 tablet (50 mg total) by mouth daily.   Multiple Vitamins-Minerals (CENTRUM SILVER PO) Take 1 tablet by mouth daily.   Omega-3 Fatty Acids (FISH OIL) 1000 MG CAPS Take 1 capsule by mouth 3 (three) times daily.    omeprazole (PRILOSEC) 20 MG capsule Take 1-2 capsules (20-40 mg total) by mouth daily.   rosuvastatin (CRESTOR) 5 MG tablet TAKE 1 TABLET BY MOUTH ON MONDAY, WEDNESDAY, AND FRIDAY   No facility-administered encounter medications on file as of 12/14/2021.    Allergies  Allergen Reactions   Prevnar 13 [Pneumococcal 13-Val Conj Vacc] Hives    Red/streaking arm   Penicillins Hives    Has patient had a PCN reaction causing immediate rash, facial/tongue/throat swelling, SOB or lightheadedness with  hypotension: No Has patient had a PCN reaction causing severe rash involving mucus membranes or skin necrosis: No Has patient had a PCN reaction that required hospitalization: No Has patient had a PCN reaction occurring within the last 10 years: No If all of the above answers are "NO", then may proceed with Cephalosporin use.    Tetanus Toxoids Other (See Comments)    Area of injection was red and streaked.   Codeine Nausea Only   Sulfonamide Derivatives Nausea Only    Review of Systems  Constitutional:  Negative for activity change, appetite change, chills,  diaphoresis, fatigue, fever, unexpected weight change and weight loss.  Cardiovascular:  Negative for chest pain.  Gastrointestinal:  Negative for abdominal pain and bowel incontinence.  Genitourinary:  Negative for bladder incontinence, decreased urine volume, difficulty urinating, dysuria, pelvic pain and urgency.  Musculoskeletal:  Positive for arthralgias (R hip), back pain and gait problem (cane/walker PRN). Negative for joint swelling and myalgias.  Neurological:  Negative for tingling, weakness, numbness, headaches and paresthesias.  Hematological:  Does not bruise/bleed easily.  All other systems reviewed and are negative.      Objective:  BP 139/77    Pulse 79    Temp 98.1 F (36.7 C) (Temporal)    Ht 4' 9"  (1.448 m)    Wt 43.5 kg    SpO2 97%    BMI 20.77 kg/m    Wt Readings from Last 3 Encounters:  12/14/21 43.5 kg  12/14/21 44 kg  12/08/21 44 kg    Physical Exam Vitals and nursing note reviewed.  Constitutional:      General: She is not in acute distress.    Appearance: Normal appearance. She is not ill-appearing, toxic-appearing or diaphoretic.  HENT:     Head: Normocephalic and atraumatic.     Mouth/Throat:     Mouth: Mucous membranes are moist.  Eyes:     Pupils: Pupils are equal, round, and reactive to light.  Cardiovascular:     Rate and Rhythm: Normal rate and regular rhythm.     Pulses: Normal  pulses.     Heart sounds: Normal heart sounds.  Pulmonary:     Effort: Pulmonary effort is normal.     Breath sounds: Normal breath sounds.  Musculoskeletal:     Thoracic back: Normal.     Lumbar back: Tenderness present. No swelling, edema, deformity, lacerations, spasms or bony tenderness. Decreased range of motion. Negative right straight leg raise test and negative left straight leg raise test. Scoliosis present.     Right hip: Normal.     Left hip: Normal.     Right lower leg: No edema.     Left lower leg: No edema.  Skin:    General: Skin is warm and dry.     Capillary Refill: Capillary refill takes less than 2 seconds.  Neurological:     Mental Status: She is alert.     Gait: Gait abnormal.  Psychiatric:        Mood and Affect: Mood normal.        Behavior: Behavior normal.        Thought Content: Thought content normal.        Judgment: Judgment normal.    Results for orders placed or performed in visit on 10/17/21  CMP14+EGFR  Result Value Ref Range   Glucose 110 (H) 70 - 99 mg/dL   BUN 12 10 - 36 mg/dL   Creatinine, Ser 0.73 0.57 - 1.00 mg/dL   eGFR 77 >59 mL/min/1.73   BUN/Creatinine Ratio 16 12 - 28   Sodium 136 134 - 144 mmol/L   Potassium 4.2 3.5 - 5.2 mmol/L   Chloride 97 96 - 106 mmol/L   CO2 25 20 - 29 mmol/L   Calcium 10.1 8.7 - 10.3 mg/dL   Total Protein 6.6 6.0 - 8.5 g/dL   Albumin 4.5 3.5 - 4.6 g/dL   Globulin, Total 2.1 1.5 - 4.5 g/dL   Albumin/Globulin Ratio 2.1 1.2 - 2.2   Bilirubin Total 0.4 0.0 - 1.2 mg/dL   Alkaline Phosphatase 39 (L)  44 - 121 IU/L   AST 19 0 - 40 IU/L   ALT 15 0 - 32 IU/L  Lipid panel  Result Value Ref Range   Cholesterol, Total 204 (H) 100 - 199 mg/dL   Triglycerides 86 0 - 149 mg/dL   HDL 84 >39 mg/dL   VLDL Cholesterol Cal 15 5 - 40 mg/dL   LDL Chol Calc (NIH) 105 (H) 0 - 99 mg/dL   Chol/HDL Ratio 2.4 0.0 - 4.4 ratio  CBC with Differential/Platelet  Result Value Ref Range   WBC 8.7 3.4 - 10.8 x10E3/uL   RBC 4.61  3.77 - 5.28 x10E6/uL   Hemoglobin 14.5 11.1 - 15.9 g/dL   Hematocrit 41.2 34.0 - 46.6 %   MCV 89 79 - 97 fL   MCH 31.5 26.6 - 33.0 pg   MCHC 35.2 31.5 - 35.7 g/dL   RDW 11.8 11.7 - 15.4 %   Platelets 249 150 - 450 x10E3/uL   Neutrophils 44 Not Estab. %   Lymphs 43 Not Estab. %   Monocytes 10 Not Estab. %   Eos 2 Not Estab. %   Basos 1 Not Estab. %   Neutrophils Absolute 3.9 1.4 - 7.0 x10E3/uL   Lymphocytes Absolute 3.7 (H) 0.7 - 3.1 x10E3/uL   Monocytes Absolute 0.9 0.1 - 0.9 x10E3/uL   EOS (ABSOLUTE) 0.2 0.0 - 0.4 x10E3/uL   Basophils Absolute 0.0 0.0 - 0.2 x10E3/uL   Immature Granulocytes 0 Not Estab. %   Immature Grans (Abs) 0.0 0.0 - 0.1 x10E3/uL  TSH  Result Value Ref Range   TSH 3.460 0.450 - 4.500 uIU/mL  VITAMIN D 25 Hydroxy (Vit-D Deficiency, Fractures)  Result Value Ref Range   Vit D, 25-Hydroxy 54.2 30.0 - 100.0 ng/mL       Pertinent labs & imaging results that were available during my care of the patient were reviewed by me and considered in my medical decision making.  Assessment & Plan:  Kristi Sawyer was seen today for back pain.  Diagnoses and all orders for this visit:  Age-related osteoporosis without current pathological fracture Chronic bilateral low back pain without sciatica - IM Toradol injection given today. Encouraged Tylenol and moist heat. Given instructions for lower back PT. Ambulatory referral to PT placed. No red flags concerning for caude equina or malignancy.     Continue all other maintenance medications.  Follow up plan: Return if symptoms worsen or fail to improve.   Continue healthy lifestyle choices, including diet (rich in fruits, vegetables, and lean proteins, and low in salt and simple carbohydrates) and exercise (at least 30 minutes of moderate physical activity daily).  Educational handout given for lumbar exercises.   The above assessment and management plan was discussed with the patient. The patient verbalized understanding of  and has agreed to the management plan. Patient is aware to call the clinic if they develop any new symptoms or if symptoms persist or worsen. Patient is aware when to return to the clinic for a follow-up visit. Patient educated on when it is appropriate to go to the emergency department.   Deidre Ala, NP-S  I personally was present during the history, physical exam, and medical decision-making activities of this visit and have verified that the services and findings are accurately documented in the nurse practitioner student's note.  Monia Pouch, FNP-C [facility] [address] [place],  [address] [phone]

## 2021-12-16 NOTE — Progress Notes (Signed)
I have reviewed and agree with the above AWV documentation.   Monia Pouch, FNP-C [facility] [address] [place], [place] [address] [phone]

## 2021-12-19 ENCOUNTER — Emergency Department (HOSPITAL_COMMUNITY): Payer: Medicare Other

## 2021-12-19 ENCOUNTER — Encounter (HOSPITAL_COMMUNITY): Payer: Self-pay | Admitting: Pharmacy Technician

## 2021-12-19 ENCOUNTER — Other Ambulatory Visit: Payer: Self-pay

## 2021-12-19 ENCOUNTER — Observation Stay (HOSPITAL_COMMUNITY)
Admission: EM | Admit: 2021-12-19 | Discharge: 2021-12-20 | Disposition: A | Discharge status: Home / Self Care | Payer: Medicare Other | Attending: Family Medicine | Admitting: Family Medicine

## 2021-12-19 DIAGNOSIS — R9431 Abnormal electrocardiogram [ECG] [EKG]: Secondary | ICD-10-CM | POA: Diagnosis not present

## 2021-12-19 DIAGNOSIS — Z20822 Contact with and (suspected) exposure to covid-19: Secondary | ICD-10-CM | POA: Insufficient documentation

## 2021-12-19 DIAGNOSIS — S0990XA Unspecified injury of head, initial encounter: Secondary | ICD-10-CM | POA: Diagnosis not present

## 2021-12-19 DIAGNOSIS — E785 Hyperlipidemia, unspecified: Secondary | ICD-10-CM | POA: Insufficient documentation

## 2021-12-19 DIAGNOSIS — R58 Hemorrhage, not elsewhere classified: Secondary | ICD-10-CM | POA: Diagnosis not present

## 2021-12-19 DIAGNOSIS — S0101XA Laceration without foreign body of scalp, initial encounter: Principal | ICD-10-CM | POA: Insufficient documentation

## 2021-12-19 DIAGNOSIS — R0689 Other abnormalities of breathing: Secondary | ICD-10-CM | POA: Diagnosis not present

## 2021-12-19 DIAGNOSIS — R531 Weakness: Secondary | ICD-10-CM

## 2021-12-19 DIAGNOSIS — Z79899 Other long term (current) drug therapy: Secondary | ICD-10-CM | POA: Insufficient documentation

## 2021-12-19 DIAGNOSIS — Z043 Encounter for examination and observation following other accident: Secondary | ICD-10-CM | POA: Diagnosis not present

## 2021-12-19 DIAGNOSIS — W1839XA Other fall on same level, initial encounter: Secondary | ICD-10-CM | POA: Diagnosis not present

## 2021-12-19 DIAGNOSIS — Z85850 Personal history of malignant neoplasm of thyroid: Secondary | ICD-10-CM | POA: Diagnosis not present

## 2021-12-19 DIAGNOSIS — G9389 Other specified disorders of brain: Secondary | ICD-10-CM | POA: Diagnosis not present

## 2021-12-19 DIAGNOSIS — U071 COVID-19: Secondary | ICD-10-CM | POA: Diagnosis present

## 2021-12-19 DIAGNOSIS — R6889 Other general symptoms and signs: Secondary | ICD-10-CM | POA: Diagnosis not present

## 2021-12-19 DIAGNOSIS — W19XXXA Unspecified fall, initial encounter: Secondary | ICD-10-CM | POA: Diagnosis not present

## 2021-12-19 DIAGNOSIS — I951 Orthostatic hypotension: Secondary | ICD-10-CM | POA: Insufficient documentation

## 2021-12-19 DIAGNOSIS — I10 Essential (primary) hypertension: Secondary | ICD-10-CM | POA: Insufficient documentation

## 2021-12-19 DIAGNOSIS — Z743 Need for continuous supervision: Secondary | ICD-10-CM | POA: Diagnosis not present

## 2021-12-19 DIAGNOSIS — G459 Transient cerebral ischemic attack, unspecified: Secondary | ICD-10-CM | POA: Diagnosis not present

## 2021-12-19 DIAGNOSIS — R404 Transient alteration of awareness: Secondary | ICD-10-CM | POA: Diagnosis not present

## 2021-12-19 LAB — CBC WITH DIFFERENTIAL/PLATELET
Abs Immature Granulocytes: 0.05 10*3/uL (ref 0.00–0.07)
Basophils Absolute: 0 10*3/uL (ref 0.0–0.1)
Basophils Relative: 0 %
Eosinophils Absolute: 0 10*3/uL (ref 0.0–0.5)
Eosinophils Relative: 0 %
HCT: 41.3 % (ref 36.0–46.0)
Hemoglobin: 14.1 g/dL (ref 12.0–15.0)
Immature Granulocytes: 0 %
Lymphocytes Relative: 3 %
Lymphs Abs: 0.4 10*3/uL — ABNORMAL LOW (ref 0.7–4.0)
MCH: 32 pg (ref 26.0–34.0)
MCHC: 34.1 g/dL (ref 30.0–36.0)
MCV: 93.7 fL (ref 80.0–100.0)
Monocytes Absolute: 1.2 10*3/uL — ABNORMAL HIGH (ref 0.1–1.0)
Monocytes Relative: 9 %
Neutro Abs: 11.3 10*3/uL — ABNORMAL HIGH (ref 1.7–7.7)
Neutrophils Relative %: 88 %
Platelets: 214 10*3/uL (ref 150–400)
RBC: 4.41 MIL/uL (ref 3.87–5.11)
RDW: 12.7 % (ref 11.5–15.5)
WBC: 12.9 10*3/uL — ABNORMAL HIGH (ref 4.0–10.5)
nRBC: 0 % (ref 0.0–0.2)

## 2021-12-19 LAB — BASIC METABOLIC PANEL
Anion gap: 9 (ref 5–15)
BUN: 16 mg/dL (ref 8–23)
CO2: 27 mmol/L (ref 22–32)
Calcium: 9.6 mg/dL (ref 8.9–10.3)
Chloride: 101 mmol/L (ref 98–111)
Creatinine, Ser: 0.6 mg/dL (ref 0.44–1.00)
GFR, Estimated: >60 mL/min (ref >60)
Glucose, Bld: 135 mg/dL — ABNORMAL HIGH (ref 70–99)
Potassium: 3.8 mmol/L (ref 3.5–5.1)
Sodium: 137 mmol/L (ref 135–145)

## 2021-12-19 LAB — URINALYSIS, ROUTINE W REFLEX MICROSCOPIC
Bilirubin Urine: NEGATIVE
Glucose, UA: NEGATIVE mg/dL
Ketones, ur: 15 mg/dL — AB
Leukocytes,Ua: NEGATIVE
Nitrite: NEGATIVE
Specific Gravity, Urine: 1.015 (ref 1.005–1.030)
pH: 7.5 (ref 5.0–8.0)

## 2021-12-19 LAB — URINALYSIS, MICROSCOPIC (REFLEX)

## 2021-12-19 LAB — PROCALCITONIN: Procalcitonin: <0.1 ng/mL

## 2021-12-19 LAB — RESP PANEL BY RT-PCR (FLU A&B, COVID) ARPGX2
Influenza A by PCR: NEGATIVE
Influenza B by PCR: NEGATIVE
SARS Coronavirus 2 by RT PCR: POSITIVE — AB

## 2021-12-19 LAB — LACTATE DEHYDROGENASE: LDH: 179 U/L (ref 98–192)

## 2021-12-19 LAB — D-DIMER, QUANTITATIVE: D-Dimer, Quant: 1.08 ug/mL-FEU — ABNORMAL HIGH (ref 0.00–0.50)

## 2021-12-19 LAB — FERRITIN: Ferritin: 137 ng/mL (ref 11–307)

## 2021-12-19 MED ORDER — ACETAMINOPHEN 650 MG RE SUPP: 650.0000 mg | Freq: Four times a day (QID) | RECTAL | Status: DC | PRN

## 2021-12-19 MED ORDER — ONDANSETRON HCL 4 MG PO TABS: 4.0000 mg | ORAL_TABLET | Freq: Four times a day (QID) | ORAL | Status: DC | PRN

## 2021-12-19 MED ORDER — ZINC SULFATE 220 (50 ZN) MG PO CAPS
220.0000 mg | ORAL_CAPSULE | Freq: Every day | ORAL | Status: DC
  Administered 2021-12-20: 220 mg via ORAL
  Filled 2021-12-19: qty 1

## 2021-12-19 MED ORDER — POLYETHYLENE GLYCOL 3350 17 G PO PACK: 17.0000 g | PACK | Freq: Every day | ORAL | Status: DC | PRN

## 2021-12-19 MED ORDER — ASCORBIC ACID 500 MG PO TABS
500.0000 mg | ORAL_TABLET | Freq: Every day | ORAL | Status: DC
  Administered 2021-12-20: 500 mg via ORAL
  Filled 2021-12-19: qty 1

## 2021-12-19 MED ORDER — ONDANSETRON HCL 4 MG/2ML IJ SOLN: 4.0000 mg | Freq: Four times a day (QID) | INTRAMUSCULAR | Status: DC | PRN

## 2021-12-19 MED ORDER — ACETAMINOPHEN 325 MG PO TABS: 650.0000 mg | ORAL_TABLET | Freq: Four times a day (QID) | ORAL | Status: DC | PRN

## 2021-12-19 MED ORDER — ALBUTEROL SULFATE HFA 108 (90 BASE) MCG/ACT IN AERS
2.0000 | INHALATION_SPRAY | Freq: Two times a day (BID) | RESPIRATORY_TRACT | Status: DC
  Administered 2021-12-20: 2 via RESPIRATORY_TRACT

## 2021-12-19 MED ORDER — ENOXAPARIN SODIUM 30 MG/0.3ML IJ SOSY
30.0000 mg | PREFILLED_SYRINGE | INTRAMUSCULAR | Status: DC
  Administered 2021-12-20: 30 mg via SUBCUTANEOUS
  Filled 2021-12-19: qty 0.3

## 2021-12-19 MED ORDER — POTASSIUM CHLORIDE IN NACL 20-0.9 MEQ/L-% IV SOLN: INTRAVENOUS | Status: DC

## 2021-12-19 MED ORDER — LORAZEPAM 2 MG/ML IJ SOLN
0.5000 mg | Freq: Once | INTRAMUSCULAR | Status: AC
Start: 2021-12-19 — End: 2021-12-19
  Administered 2021-12-19: 0.5 mg via INTRAVENOUS
  Filled 2021-12-19: qty 1

## 2021-12-19 MED ORDER — ALBUTEROL SULFATE HFA 108 (90 BASE) MCG/ACT IN AERS
2.0000 | INHALATION_SPRAY | Freq: Four times a day (QID) | RESPIRATORY_TRACT | Status: DC
  Administered 2021-12-19: 2 via RESPIRATORY_TRACT
  Filled 2021-12-19: qty 6.7

## 2021-12-19 MED ORDER — SODIUM CHLORIDE 0.9 % IV BOLUS
1000.0000 mL | Freq: Once | INTRAVENOUS | Status: AC
  Administered 2021-12-19: 1000 mL via INTRAVENOUS

## 2021-12-19 NOTE — ED Notes (Signed)
Per MRI, ok to medicate pt now.

## 2021-12-19 NOTE — H&P (Signed)
History and Physical    Kristi Sawyer QMG:[id number] DOB: 1926/07/28 DOA: 12/19/2021  PCP: Dettinger, Fransisca Kaufmann, MD   Patient coming from: Home  I have personally briefly reviewed patient's old medical records in [place]  Chief Complaint: Falls  HPI: Kristi Sawyer is a 88 y.o. female with medical history significant for HTN, presented to the ED with reports of fall.  Patient reports she fell 3 times today.  She did not hit at least once when she fell backwards.  She reports last night she was feeling a little ill, this morning she woke up with nausea without vomiting, and she had a cough.  No fevers, no chills.  No difficulty breathing.  She reports good oral intake.  No diarrhea. At baseline she uses a walker for assistance with ambulation.  She still drives.  She lives alone. She reports history of intermittent dizziness when getting up but this usually self resolves within a minute.  ED Course: Afebrile, blood pressure 140s to 171.  Temperature 98.8.  Heart rate 80-93.  Blood pressure 140s to 160s.  Orthostatic vitals positive, blood pressure systolic dropping from 326-712, diastolic dropping from 45-80. 1 L bolus given. Head CT and subsequent MRI brain-evidence of acute abnormality, shows chronic microvascular ischemic disease and cerebral atrophy.   Review of Systems: As per HPI all other systems reviewed and negative.  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 benign tumor      reports that she has never smoked. She has never used smokeless tobacco. She reports that she does not drink alcohol and does not use drugs.  Allergies  Allergen Reactions   Prevnar 13 [Pneumococcal 13-Val Conj Vacc] Hives    Red/streaking arm   Penicillins Hives    Has patient had a PCN reaction causing immediate rash, facial/tongue/throat swelling, SOB or lightheadedness with hypotension: No Has patient had a PCN reaction causing severe rash involving mucus membranes or skin necrosis: No Has patient had a PCN reaction that required hospitalization: No Has patient had a PCN reaction occurring within the last 10 years: No If all of the above answers are "NO", then may proceed with Cephalosporin use.    Tetanus Toxoids Other (See Comments)    Area of injection was red and streaked.   Codeine Nausea Only   Sulfonamide Derivatives Nausea Only    Family History  Problem Relation Age of Onset   Healthy Mother  Lung cancer Father    Colon cancer Neg Hx    Stomach cancer Neg Hx    Esophageal cancer Neg Hx    Prior to Admission medications   Medication Sig Start Date End Date Taking? Authorizing Provider  amLODipine (NORVASC) 5 MG tablet Take 1 tablet (5 mg total) by mouth daily. 10/17/21  Yes Dettinger, Fransisca Kaufmann, MD  Calcium Carbonate-Vit D-Min (CALTRATE PLUS PO) Take 1 tablet by mouth daily.   Yes [provider]  Cholecalciferol (VITAMIN D) 2000 UNITS CAPS Take by mouth as directed. Take 1 tablet daily except none on saturdays and sundays   Yes [provider]  denosumab (PROLIA) 60 MG/ML SOSY injection Inject 60 mg into the skin every 6 (six) months.   Yes [provider]  Fe Fum-FePoly-Vit C-Vit B3  (INTEGRA) 62.5-62.5-40-3 MG CAPS TAKE 1 CAPSULE BY MOUTH EVERY DAY 11/30/21  Yes Gatha Mayer, MD  hydrALAZINE (APRESOLINE) 10 MG tablet Take 1 tablet (10 mg total) by mouth every 8 (eight) hours. 10/17/21  Yes Dettinger, Fransisca Kaufmann, MD  Inulin 1.5 g CHEW Chew 5 tablets by mouth daily. FIBER CHOICE   Yes [provider]  metoCLOPramide (REGLAN) 10 MG tablet TAKE 1 TABLET BY MOUTH EVERYDAY AT BEDTIME Patient taking differently: Take 10 mg by mouth at bedtime. 11/09/21  Yes Gatha Mayer, MD  mirabegron ER (MYRBETRIQ) 50 MG TB24 tablet Take 1 tablet (50 mg total) by mouth daily. 10/17/21  Yes Dettinger, Fransisca Kaufmann, MD  Multiple Vitamins-Minerals (CENTRUM SILVER PO) Take 1 tablet by mouth daily.   Yes [provider]  Omega-3 Fatty Acids (FISH OIL) 1000 MG CAPS Take 1 capsule by mouth 3 (three) times daily.    Yes [provider]  omeprazole (PRILOSEC) 20 MG capsule Take 1-2 capsules (20-40 mg total) by mouth daily. 10/17/21  Yes Dettinger, Fransisca Kaufmann, MD  rosuvastatin (CRESTOR) 5 MG tablet TAKE 1 TABLET BY MOUTH ON MONDAY, [place], AND FRIDAY Patient taking differently: Take 5 mg by mouth daily. Monday,Wednesday and Friday 11/15/21  Yes Dettinger, Fransisca Kaufmann, MD    Physical Exam: Vitals:   12/19/21 1845 12/19/21 1930 12/19/21 2000 12/19/21 2030  BP: (!) 165/73 (!) 171/86 (!) 161/91 (!) 146/81  Pulse: 80 82 92 93  Resp: (!) 22 17 17  (!) 22  Temp:      SpO2: 96% 96% 95% 97%    Constitutional: frail appearing, calm, comfortable Vitals:   12/19/21 1845 12/19/21 1930 12/19/21 2000 12/19/21 2030  BP: (!) 165/73 (!) 171/86 (!) 161/91 (!) 146/81  Pulse: 80 82 92 93  Resp: (!) 22 17 17  (!) 22  Temp:      SpO2: 96% 96% 95% 97%   Eyes: PERRL, lids and conjunctivae normal ENMT: Mucous membranes are moist.  Neck: normal, supple, no masses, no thyromegaly Respiratory: clear to auscultation bilaterally, no wheezing, no crackles. Normal respiratory effort. No accessory muscle  use.  Cardiovascular: Regular rate and rhythm, no murmurs / rubs / gallops. No extremity edema. 2+ pedal pulses.  Abdomen: no tenderness, no masses palpated. No hepatosplenomegaly. Bowel sounds positive.  Musculoskeletal: no clubbing / cyanosis. No joint deformity upper and lower extremities. Good ROM, no contractures. Normal muscle tone.  Skin: Bruising to extremities- right upper extremity from falls, laceration to scalp, no ulcers. No induration Neurologic: No facial asymmetry, speech clear without evidence of aphasia, 4+/5 strength in all extremities Psychiatric: Normal judgment and insight. Alert and oriented x 3. Normal mood.   Labs on  Admission: I have personally reviewed following labs and imaging studies  CBC: Recent Labs  Lab 12/19/21 1425  WBC 12.9*  NEUTROABS 11.3*  HGB 14.1  HCT 41.3  MCV 93.7  PLT 224   Basic Metabolic Panel: Recent Labs  Lab 12/19/21 1526  NA 137  K 3.8  CL 101  CO2 27  GLUCOSE 135*  BUN 16  CREATININE 0.60  CALCIUM 9.6   Urine analysis:    Component Value Date/Time   COLORURINE YELLOW 12/19/2021 1420   APPEARANCEUR CLOUDY (A) 12/19/2021 1420   LABSPEC 1.015 12/19/2021 1420   PHURINE 7.5 12/19/2021 1420   GLUCOSEU NEGATIVE 12/19/2021 1420   HGBUR TRACE (A) 12/19/2021 1420   BILIRUBINUR NEGATIVE 12/19/2021 1420   BILIRUBINUR neg 07/07/2014 1033   KETONESUR 15 (A) 12/19/2021 1420   PROTEINUR TRACE (A) 12/19/2021 1420   UROBILINOGEN negative 07/07/2014 1033   NITRITE NEGATIVE 12/19/2021 1420   LEUKOCYTESUR NEGATIVE 12/19/2021 1420    Radiological Exams on Admission: DG Chest 1 View  Result Date: 12/19/2021 CLINICAL DATA:  Cough.  Fall. EXAM: CHEST  1 VIEW COMPARISON:  AP chest 10/26/2017 FINDINGS: Cardiac silhouette and mediastinal contours are within normal limits. Calcification is again seen within the aortic arch. Mild chronic bilateral interstitial thickening. No focal airspace opacity to indicate pneumonia. No pulmonary edema,  pleural effusion, or pneumothorax. Mild multilevel degenerative disc changes of the thoracic spine. IMPRESSION: No acute cardiopulmonary disease process. Electronically Signed   By: Yvonne Kendall M.D.   On: 12/19/2021 14:50   CT Head Wo Contrast  Result Date: 12/19/2021 CLINICAL DATA:  Posterior head laceration after falling backwards and hitting her head on a table. EXAM: CT HEAD WITHOUT CONTRAST TECHNIQUE: Contiguous axial images were obtained from the base of the skull through the vertex without intravenous contrast. RADIATION DOSE REDUCTION: This exam was performed according to the departmental dose-optimization program which includes automated exposure control, adjustment of the mA and/or kV according to patient size and/or use of iterative reconstruction technique. COMPARISON:  09/09/2021.  Brain MR dated 09/22/2021. FINDINGS: Brain: Stable moderately enlarged ventricles and subarachnoid spaces. Stable moderate patchy white matter low density in both cerebral hemispheres. No intracranial hemorrhage, mass lesion or CT evidence of acute infarction. Vascular: No hyperdense vessel or unexpected calcification. Skull: Normal. Negative for fracture or focal lesion. Sinuses/Orbits: Unremarkable. 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 IMPRESSION: 1. No skull fracture or intracranial hemorrhage. 2. Stable atrophy and chronic small vessel white matter ischemic changes. Electronically Signed   By: Claudie Revering M.D.   On: 12/19/2021 14:58   MR BRAIN WO CONTRAST  Result Date: 12/19/2021 CLINICAL DATA:  Transient ischemic attack (TIA) EXAM: MRI HEAD WITHOUT CONTRAST TECHNIQUE: Multiplanar, multiecho pulse sequences of the brain and surrounding structures were obtained without intravenous contrast. COMPARISON:  MRI September 22, 2021. FINDINGS: Brain: No acute infarction, hemorrhage, hydrocephalus, extra-axial collection or mass lesion. Similar mild to moderate for age scattered T2/FLAIR  hyperintensities in the white matter, nonspecific but compatible with chronic microvascular ischemic disease. Similar cerebral atrophy. Vascular: Major arterial flow voids are maintained at the skull base. Skull and upper cervical spine: Normal marrow signal. Sinuses/Orbits: Negative. Other: No mastoid effusions. IMPRESSION: 1. No evidence of acute abnormality. 2. Similar chronic microvascular ischemic disease and cerebral atrophy (ICD10-G31.9). Electronically Signed   By: Margaretha Sheffield M.D.   On: 12/19/2021 17:55    EKG: Independently reviewed.  Sinus rhythm PVCs present PACs present.  Rate 87.  QTc 459.  Assessment/Plan Principal Problem:   Orthostatic hypotension Active Problems:   Hypertension   COVID-19 virus infection   Falls   Orthostatic hypotension with falls in the setting of COVID-19 infection-  Orthostatic vitals positive, blood pressure systolic dropping from 387-564, diastolic dropping from 33-29.  Head CT MRI without acute abnormality. -1 L bolus given, continue  N/s + 20 KCL 75cc/hr x 1 day -Hold antihypertensives Norvasc 5 mg, and hydralazine 10 mg 3 times daily -May benefit from PT evaluation prior to discharge.  COVID infection-reports a cough, symptoms of 1 day.  No dyspnea, O2 sats > 95% on room air.  Chest Clear.  Received 5 doses of COVID-vaccine. -Pharmacy consult for Paxlovid -Obtain and trend inflammatory markers  Hypertension-systolic 518A to 416.  With orthostatic hypotension. -Hold antihypertensives for now  DVT prophylaxis: Lovenox Code Status: Full code Family Communication: None at bedside Disposition Plan: ~ 1 - 2 days Consults called: None Admission status:  Obs tele   Bethena Roys MD [facility]  12/19/2021, 9:15 PM

## 2021-12-19 NOTE — ED Provider Notes (Addendum)
[facility] EMERGENCY DEPARTMENT Provider Note   CSN: [id number] Arrival date & time: 12/19/21  1202     History  Chief Complaint  Patient presents with   Lytle Michaels    Kristi Sawyer is a 88 y.o. female.  HPI  Patient with medical history including gait disturbances, anxiety, neuropathy presents with complaints of falls.  Patient states today she had 3 falls.  She states the first 1 was while she was sitting on the commode she attempted to get up off the commode but was unable to as she felt too weak and fell onto her right side.  She states that she was able to get herself back up onto the commode but started to feel dizzy and then fell onto her right side.  She then army crawled into her bedroom where she called for help, her neighbor came and helped her up and walked her into the kitchen but as she was walked to the kitchen she got dizzy again and fell and struck the back of her head on the table.  There was no loss of conscious, no tongue biting did not become incontinent, patient is not on anticoag.  Patient has no complaints at this time, she has no  headaches, change in vision, paresthesias or weakness upper or lower extremities, denies any neck back pain in the upper or lower extremities.  She does note that she feels slightly dizziness when she goes from a sitting to sitting position she states this is normal for her, generally takes a minute and then resolve on its own, she does not walk with a walker, lives home alone.  Patient does state that last night she was not feeling so well, endorses a productive cough and slight nausea but denies any congestion fevers chills stomach pains vomiting constipation diarrhea or general body aches.  Reviewed patient's chart was seen here few months ago for similar presentation she had CT scan as well and MRI performed both with her negative for acute findings, did show that she was slightly dehydrated and had a UTI was treated for this and then was  discharged home.  Home Medications Prior to Admission medications   Medication Sig Start Date End Date Taking? Authorizing Provider  amLODipine (NORVASC) 5 MG tablet Take 1 tablet (5 mg total) by mouth daily. 10/17/21  Yes Dettinger, Fransisca Kaufmann, MD  Calcium Carbonate-Vit D-Min (CALTRATE PLUS PO) Take 1 tablet by mouth daily.   Yes [provider]  Cholecalciferol (VITAMIN D) 2000 UNITS CAPS Take by mouth as directed. Take 1 tablet daily except none on saturdays and sundays   Yes [provider]  denosumab (PROLIA) 60 MG/ML SOSY injection Inject 60 mg into the skin every 6 (six) months.   Yes [provider]  Fe Fum-FePoly-Vit C-Vit B3 (INTEGRA) 62.5-62.5-40-3 MG CAPS TAKE 1 CAPSULE BY MOUTH EVERY DAY 11/30/21  Yes Gatha Mayer, MD  hydrALAZINE (APRESOLINE) 10 MG tablet Take 1 tablet (10 mg total) by mouth every 8 (eight) hours. 10/17/21  Yes Dettinger, Fransisca Kaufmann, MD  Inulin 1.5 g CHEW Chew 5 tablets by mouth daily. FIBER CHOICE   Yes [provider]  metoCLOPramide (REGLAN) 10 MG tablet TAKE 1 TABLET BY MOUTH EVERYDAY AT BEDTIME Patient taking differently: Take 10 mg by mouth at bedtime. 11/09/21  Yes Gatha Mayer, MD  mirabegron ER (MYRBETRIQ) 50 MG TB24 tablet Take 1 tablet (50 mg total) by mouth daily. 10/17/21  Yes Dettinger, Fransisca Kaufmann, MD  Multiple Vitamins-Minerals (  CENTRUM SILVER PO) Take 1 tablet by mouth daily.   Yes [provider]  Omega-3 Fatty Acids (FISH OIL) 1000 MG CAPS Take 1 capsule by mouth 3 (three) times daily.    Yes [provider]  omeprazole (PRILOSEC) 20 MG capsule Take 1-2 capsules (20-40 mg total) by mouth daily. 10/17/21  Yes Dettinger, Fransisca Kaufmann, MD  rosuvastatin (CRESTOR) 5 MG tablet TAKE 1 TABLET BY MOUTH ON MONDAY, [place], AND FRIDAY Patient taking differently: Take 5 mg by mouth daily. Monday,Wednesday and Friday 11/15/21  Yes Dettinger, Fransisca Kaufmann, MD      Allergies    Prevnar 13 [pneumococcal 13-val conj  vacc], Penicillins, Tetanus toxoids, Codeine, and Sulfonamide derivatives    Review of Systems   Review of Systems  Constitutional:  Negative for chills and fever.  Eyes:  Negative for visual disturbance.  Respiratory:  Positive for cough. Negative for shortness of breath.   Cardiovascular:  Negative for chest pain.  Gastrointestinal:  Positive for nausea. Negative for abdominal pain, diarrhea and vomiting.  Neurological:  Positive for dizziness and weakness. Negative for seizures, numbness and headaches.   Physical Exam Updated Vital Signs BP (!) 153/75 (BP Location: Left Arm)    Pulse 89    Temp 98.6 F (37 C) (Oral)    Resp 16    SpO2 94%  Physical Exam Vitals and nursing note reviewed.  Constitutional:      General: She is not in acute distress.    Appearance: She is not ill-appearing.  HENT:     Head: Normocephalic and atraumatic.     Comments: Patient is a noted laceration on the right orbital region, measuring boxing 3 cm in length 1 to 2 mm in depth hemodynamically stable no foreign bodies present no other deformities noted on the head, no raccoon eyes or battle sign noted.    Nose: No congestion.     Mouth/Throat:     Mouth: Mucous membranes are moist.     Pharynx: Oropharynx is clear. No oropharyngeal exudate or posterior oropharyngeal erythema.     Comments: No trismus no torticollis no oral trauma  Eyes:     Extraocular Movements: Extraocular movements intact.     Conjunctiva/sclera: Conjunctivae normal.     Pupils: Pupils are equal, round, and reactive to light.  Cardiovascular:     Rate and Rhythm: Normal rate and regular rhythm.     Pulses: Normal pulses.     Heart sounds: No murmur heard.   No friction rub. No gallop.  Pulmonary:     Effort: No respiratory distress.     Breath sounds: No wheezing, rhonchi or rales.  Abdominal:     Palpations: Abdomen is soft.     Tenderness: There is no abdominal tenderness. There is no right CVA tenderness or left CVA  tenderness.  Musculoskeletal:     Cervical back: No tenderness.     Comments: Spine was palpated nontender to palpation no step-off or deformities noted patient is moving all 4 extremities at difficulty 5 out of 5 strength neurovascularly intact no pelvis instability no leg shortening  Skin:    General: Skin is warm and dry.  Neurological:     Mental Status: She is alert.     GCS: GCS eye subscore is 4. GCS verbal subscore is 5. GCS motor subscore is 6.     Cranial Nerves: Cranial nerves 2-12 are intact. No cranial nerve deficit.     Sensory: Sensation is intact.  Motor: No weakness.     Coordination: Romberg sign negative. Finger-Nose-Finger Test normal.     Gait: Gait abnormal.     Comments: Cranial nerves II through XII grossly intact no difficult word finding, no slurring of her words, you felt use of commands, no weakness present.  Attempted to send the patient was able to hold herself up but was extremely off balance.  Psychiatric:        Mood and Affect: Mood normal.    ED Results / Procedures / Treatments   Labs (all labs ordered are listed, but only abnormal results are displayed) Labs Reviewed  RESP PANEL BY RT-PCR (FLU A&B, COVID) ARPGX2 - Abnormal; Notable for the following components:      Result Value   SARS Coronavirus 2 by RT PCR POSITIVE (*)    All other components within normal limits  CBC WITH DIFFERENTIAL/PLATELET - Abnormal; Notable for the following components:   WBC 12.9 (*)    Neutro Abs 11.3 (*)    Lymphs Abs 0.4 (*)    Monocytes Absolute 1.2 (*)    All other components within normal limits  URINALYSIS, ROUTINE W REFLEX MICROSCOPIC - Abnormal; Notable for the following components:   APPearance CLOUDY (*)    Hgb urine dipstick TRACE (*)    Ketones, ur 15 (*)    Protein, ur TRACE (*)    All other components within normal limits  BASIC METABOLIC PANEL - Abnormal; Notable for the following components:   Glucose, Bld 135 (*)    All other components  within normal limits  URINALYSIS, MICROSCOPIC (REFLEX) - Abnormal; Notable for the following components:   Bacteria, UA FEW (*)    All other components within normal limits  D-DIMER, QUANTITATIVE - Abnormal; Notable for the following components:   D-Dimer, Quant 1.08 (*)    All other components within normal limits  LACTATE DEHYDROGENASE  FERRITIN  PROCALCITONIN  C-REACTIVE PROTEIN  CBC  CBC WITH DIFFERENTIAL/PLATELET  COMPREHENSIVE METABOLIC PANEL  C-REACTIVE PROTEIN  D-DIMER, QUANTITATIVE  FERRITIN  MAGNESIUM  PHOSPHORUS    EKG EKG Interpretation  Date/Time:  Monday December 19 2021 15:22:53 EST Ventricular Rate:  87 PR Interval:  128 QRS Duration: 98 QT Interval:  382 QTC Calculation: 459 R Axis:   63 Text Interpretation: Sinus rhythm with occasional Premature ventricular complexes and Premature atrial complexes Otherwise normal ECG When compared with ECG of 10-Sep-2021 06:06, PREVIOUS ECG IS PRESENT Confirmed by Nanda Quinton [phone]) on 12/19/2021 5:13:20 PM  Radiology DG Chest 1 View  Result Date: 12/19/2021 CLINICAL DATA:  Cough.  Fall. EXAM: CHEST  1 VIEW COMPARISON:  AP chest 10/26/2017 FINDINGS: Cardiac silhouette and mediastinal contours are within normal limits. Calcification is again seen within the aortic arch. Mild chronic bilateral interstitial thickening. No focal airspace opacity to indicate pneumonia. No pulmonary edema, pleural effusion, or pneumothorax. Mild multilevel degenerative disc changes of the thoracic spine. IMPRESSION: No acute cardiopulmonary disease process. Electronically Signed   By: Yvonne Kendall M.D.   On: 12/19/2021 14:50   CT Head Wo Contrast  Result Date: 12/19/2021 CLINICAL DATA:  Posterior head laceration after falling backwards and hitting her head on a table. EXAM: CT HEAD WITHOUT CONTRAST TECHNIQUE: Contiguous axial images were obtained from the base of the skull through the vertex without intravenous contrast. RADIATION DOSE REDUCTION:  This exam was performed according to the departmental dose-optimization program which includes automated exposure control, adjustment of the mA and/or kV according to patient size and/or use of  iterative reconstruction technique. COMPARISON:  09/09/2021.  Brain MR dated 09/22/2021. FINDINGS: Brain: Stable moderately enlarged ventricles and subarachnoid spaces. Stable moderate patchy white matter low density in both cerebral hemispheres. No intracranial hemorrhage, mass lesion or CT evidence of acute infarction. Vascular: No hyperdense vessel or unexpected calcification. Skull: Normal. Negative for fracture or focal lesion. Sinuses/Orbits: Unremarkable. 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 IMPRESSION: 1. No skull fracture or intracranial hemorrhage. 2. Stable atrophy and chronic small vessel white matter ischemic changes. Electronically Signed   By: Claudie Revering M.D.   On: 12/19/2021 14:58   MR BRAIN WO CONTRAST  Result Date: 12/19/2021 CLINICAL DATA:  Transient ischemic attack (TIA) EXAM: MRI HEAD WITHOUT CONTRAST TECHNIQUE: Multiplanar, multiecho pulse sequences of the brain and surrounding structures were obtained without intravenous contrast. COMPARISON:  MRI September 22, 2021. FINDINGS: Brain: No acute infarction, hemorrhage, hydrocephalus, extra-axial collection or mass lesion. Similar mild to moderate for age scattered T2/FLAIR hyperintensities in the white matter, nonspecific but compatible with chronic microvascular ischemic disease. Similar cerebral atrophy. Vascular: Major arterial flow voids are maintained at the skull base. Skull and upper cervical spine: Normal marrow signal. Sinuses/Orbits: Negative. Other: No mastoid effusions. IMPRESSION: 1. No evidence of acute abnormality. 2. Similar chronic microvascular ischemic disease and cerebral atrophy (ICD10-G31.9). Electronically Signed   By: Margaretha Sheffield M.D.   On: 12/19/2021 17:55    Procedures .Marland KitchenLaceration  Repair  Date/Time: 12/19/2021 11:25 PM Performed by: Marcello Fennel, PA-C Authorized by: Marcello Fennel, PA-C   Consent:    Consent obtained:  Verbal   Consent given by:  Patient   Risks discussed:  Infection, pain, retained foreign body, need for additional repair, poor cosmetic result, tendon damage, vascular damage, poor wound healing and nerve damage   Alternatives discussed:  No treatment, delayed treatment, observation and referral Universal protocol:    Patient identity confirmed:  Verbally with patient Anesthesia:    Anesthesia method:  None Laceration details:    Location:  Scalp   Scalp location:  Occipital   Length (cm):  2   Depth (mm):  2 Pre-procedure details:    Preparation:  Patient was prepped and draped in usual sterile fashion and imaging obtained to evaluate for foreign bodies Exploration:    Limited defect created (wound extended): no     Imaging obtained: x-ray     Imaging outcome: foreign body not noted     Wound exploration: wound explored through full range of motion and entire depth of wound visualized     Contaminated: no   Treatment:    Area cleansed with:  Saline   Amount of cleaning:  Standard   Irrigation solution:  Sterile saline   Irrigation method:  Pressure wash   Visualized foreign bodies/material removed: no     Debridement:  None Skin repair:    Repair method:  Staples   Number of staples:  1 Approximation:    Approximation:  Close Repair type:    Repair type:  Simple Post-procedure details:    Dressing:  Open (no dressing)   Procedure completion:  Tolerated well, no immediate complications    Medications Ordered in ED Medications  enoxaparin (LOVENOX) injection 30 mg (has no administration in time range)  0.9 % NaCl with KCl 20 mEq/ L  infusion ( Intravenous New Bag/Given 12/19/21 2305)  acetaminophen (TYLENOL) tablet 650 mg (has no administration in time range)    Or  acetaminophen (TYLENOL) suppository 650 mg (has no  administration in time range)  ondansetron (ZOFRAN) tablet 4 mg (has no administration in time range)    Or  ondansetron (ZOFRAN) injection 4 mg (has no administration in time range)  polyethylene glycol (MIRALAX / GLYCOLAX) packet 17 g (has no administration in time range)  ascorbic acid (VITAMIN C) tablet 500 mg (has no administration in time range)  zinc sulfate capsule 220 mg (has no administration in time range)  albuterol (VENTOLIN HFA) 108 (90 Base) MCG/ACT inhaler 2 puff (has no administration in time range)  LORazepam (ATIVAN) injection 0.5 mg (0.5 mg Intravenous Given 12/19/21 1556)  sodium chloride 0.9 % bolus 1,000 mL (0 mLs Intravenous Stopped 12/19/21 1936)    ED Course/ Medical Decision Making/ A&P                           Medical Decision Making Amount and/or Complexity of Data Reviewed Labs: ordered. Radiology: ordered.  Risk Prescription drug management. Decision regarding hospitalization.   This patient presents to the ED for concern of fall, this involves an extensive number of treatment options, and is a complaint that carries with it a high risk of complications and morbidity.  The differential diagnosis includes CVA, intracerebral dissection, intracranial head bleed metabolic abnormality    Additional history obtained:  Additional history obtained from electronic medical record External records from outside source obtained and reviewed including please see HPI for further detail   Co morbidities that complicate the patient evaluation  Neuropathy  Social Determinants of Health:  Age    Lab Tests:  I Ordered, and personally interpreted labs.  The pertinent results include: CBC shows leukocytosis of 12.9, BMP shows glucose of 135, respiratory panel positive for COVID, UA shows negative nitrates leukocytes and few bacteria. Positive orthostatics   Imaging Studies ordered:  I ordered imaging studies including CT head, chest x-ray, MRI brain I  independently visualized and interpreted imaging which showed CT head chest x-ray both of which negative for acute findings.  MRI brain negative for acute findings I agree with the radiologist interpretation   Cardiac Monitoring:  The patient was maintained on a cardiac monitor.  I personally viewed and interpreted the cardiac monitored which showed an underlying rhythm of: Sinus rhythm without signs of ischemia   Medicines ordered and prescription drug management:  I ordered medication including fluids for positive orthostatics I have reviewed the patients home medicines and have made adjustments as needed    Reevaluation:  Patient is updated on lab or imaging thus far due to her continued off balance will recommend MRI brain to rule out cerebellar infarction she is agreement this plan we will continue to monitor.  Patient is a laceration on the right aspect of her occipital lobe measuring 2 cm in length 1 to 2 mm in depth, hemodynamically stable, will recommend stable for improved wound healing.  Patient agreement this plan tolerated procedure well.  Patient was updated on imaging of MRI as well as her lab work she is resting comfortably vital signs remained stable she has no complaints, likely patient's weakness and dizziness are from dehydration as well as testing positive for COVID, recommend admission she is agreement this plan.  Will consult hospitalist team   Consultations Obtained:  I requested consultation with Dr. Denton Brick hospitalist team,  and discussed lab and imaging findings as well as pertinent plan - they recommend: She will admit the patient.  Rule out Low suspicion for intracranial head bleed, mass, CVA  as imaging is all negative for these findings.  Low suspicion for dissection of the vertebral or carotid artery as presentation atypical of etiology.  Low suspicion for meningitis as she has no meningeal sign present. Low Suspicion for systemic infection patient is  nontoxic-appearing vital signs are reassuring, she does have leukocytosis 12.9 likely this is coming from COVID infection.  I have low suspicion for UTI [place] or kidney stone she does not endorse any urinary symptoms, UA is negative for nitrites or leukocytes only shows few bacteria. low Suspicion for intra-abdominal abnormalities abdomen soft nontender to palpation patient tolerating p.o. passing gas having normal bowel movements.  Low  suspicion for pneumonia as lungs are clear bilaterally.  Listed for ACS she denies any chest pain or shortness of breath EKG without signs of ischemia.   Dispostion and problem list  After consideration of the diagnostic results and the patients response to treatment, I feel that the patent would benefit from admission.  Weakness-likely secondary to COVID  will need continued IV resuscitation and continued monitoring. Head laceration-patient received 1 staple in her head, will recommend patient wound care, follow-up Did not need for staple removal            Final Clinical Impression(s) / ED Diagnoses Final diagnoses:  COVID  Weakness    Rx / DC Orders ED Discharge Orders     None         Marcello Fennel, PA-C 12/19/21 2249    Marcello Fennel, PA-C 12/19/21 2327    Milton Ferguson, MD 12/20/21 [phone]

## 2021-12-19 NOTE — ED Triage Notes (Signed)
Pt bib ems from home where pt had witnessed fall. Pt reports being dizzy today and falling multiple times. The last fall pt fell backwards and hit her head on the kitchen table. Laceration to posterior scalp. Bleeding controlled. VSS with ems. Initially pt in bigeminy, now NSR with occasional PVC. Pt alert and oriented. EMS states negative for orthostatic changes.

## 2021-12-19 NOTE — ED Notes (Signed)
Purewick was placed @2000 

## 2021-12-20 ENCOUNTER — Encounter (HOSPITAL_COMMUNITY): Payer: Self-pay | Admitting: Internal Medicine

## 2021-12-20 DIAGNOSIS — S0101XA Laceration without foreign body of scalp, initial encounter: Secondary | ICD-10-CM | POA: Diagnosis present

## 2021-12-20 DIAGNOSIS — I951 Orthostatic hypotension: Secondary | ICD-10-CM | POA: Diagnosis not present

## 2021-12-20 LAB — CBC WITH DIFFERENTIAL/PLATELET
Abs Immature Granulocytes: 0.02 10*3/uL (ref 0.00–0.07)
Basophils Absolute: 0 10*3/uL (ref 0.0–0.1)
Basophils Relative: 0 %
Eosinophils Absolute: 0 10*3/uL (ref 0.0–0.5)
Eosinophils Relative: 0 %
HCT: 39 % (ref 36.0–46.0)
Hemoglobin: 13 g/dL (ref 12.0–15.0)
Immature Granulocytes: 0 %
Lymphocytes Relative: 14 %
Lymphs Abs: 1.1 10*3/uL (ref 0.7–4.0)
MCH: 31.7 pg (ref 26.0–34.0)
MCHC: 33.3 g/dL (ref 30.0–36.0)
MCV: 95.1 fL (ref 80.0–100.0)
Monocytes Absolute: 1.4 10*3/uL — ABNORMAL HIGH (ref 0.1–1.0)
Monocytes Relative: 17 %
Neutro Abs: 5.4 10*3/uL (ref 1.7–7.7)
Neutrophils Relative %: 69 %
Platelets: 194 10*3/uL (ref 150–400)
RBC: 4.1 MIL/uL (ref 3.87–5.11)
RDW: 13.1 % (ref 11.5–15.5)
WBC: 8 10*3/uL (ref 4.0–10.5)
nRBC: 0 % (ref 0.0–0.2)

## 2021-12-20 LAB — COMPREHENSIVE METABOLIC PANEL
ALT: 17 U/L (ref 0–44)
AST: 18 U/L (ref 15–41)
Albumin: 3.5 g/dL (ref 3.5–5.0)
Alkaline Phosphatase: 34 U/L — ABNORMAL LOW (ref 38–126)
Anion gap: 8 (ref 5–15)
BUN: 12 mg/dL (ref 8–23)
CO2: 24 mmol/L (ref 22–32)
Calcium: 8.8 mg/dL — ABNORMAL LOW (ref 8.9–10.3)
Chloride: 107 mmol/L (ref 98–111)
Creatinine, Ser: 0.47 mg/dL (ref 0.44–1.00)
GFR, Estimated: >60 mL/min (ref >60)
Glucose, Bld: 104 mg/dL — ABNORMAL HIGH (ref 70–99)
Potassium: 3.7 mmol/L (ref 3.5–5.1)
Sodium: 139 mmol/L (ref 135–145)
Total Bilirubin: 0.5 mg/dL (ref 0.3–1.2)
Total Protein: 5.9 g/dL — ABNORMAL LOW (ref 6.5–8.1)

## 2021-12-20 LAB — MAGNESIUM: Magnesium: 1.9 mg/dL (ref 1.7–2.4)

## 2021-12-20 LAB — PHOSPHORUS: Phosphorus: 2.9 mg/dL (ref 2.5–4.6)

## 2021-12-20 LAB — D-DIMER, QUANTITATIVE: D-Dimer, Quant: 0.53 ug/mL-FEU — ABNORMAL HIGH (ref 0.00–0.50)

## 2021-12-20 LAB — FERRITIN: Ferritin: 134 ng/mL (ref 11–307)

## 2021-12-20 LAB — C-REACTIVE PROTEIN
CRP: 1.2 mg/dL — ABNORMAL HIGH (ref <1.0)
CRP: 5.3 mg/dL — ABNORMAL HIGH (ref <1.0)

## 2021-12-20 MED ORDER — MOLNUPIRAVIR EUA 200MG CAPSULE
4.0000 | ORAL_CAPSULE | Freq: Two times a day (BID) | ORAL | Status: DC
  Administered 2021-12-20: 800 mg via ORAL
  Filled 2021-12-20: qty 4

## 2021-12-20 MED ORDER — MOLNUPIRAVIR EUA 200MG CAPSULE: 4.0000 | ORAL_CAPSULE | Freq: Two times a day (BID) | ORAL | 0 refills | Status: AC

## 2021-12-20 MED ORDER — AMLODIPINE BESYLATE 5 MG PO TABS: 5.0000 mg | ORAL_TABLET | Freq: Every day | ORAL | 3 refills | Status: DC

## 2021-12-20 MED ORDER — ASCORBIC ACID 500 MG PO TABS: 500.0000 mg | ORAL_TABLET | Freq: Every day | ORAL | 2 refills | Status: DC

## 2021-12-20 MED ORDER — ALBUTEROL SULFATE HFA 108 (90 BASE) MCG/ACT IN AERS: 2.0000 | INHALATION_SPRAY | Freq: Two times a day (BID) | RESPIRATORY_TRACT | 0 refills | Status: DC

## 2021-12-20 MED ORDER — ACETAMINOPHEN 325 MG PO TABS: 650.0000 mg | ORAL_TABLET | Freq: Four times a day (QID) | ORAL | 0 refills | Status: AC | PRN

## 2021-12-20 MED ORDER — ZINC SULFATE 220 (50 ZN) MG PO CAPS: 220.0000 mg | ORAL_CAPSULE | Freq: Every day | ORAL | 1 refills | Status: DC

## 2021-12-20 NOTE — Discharge Summary (Signed)
Kristi Sawyer, is a 88 y.o. female  DOB 09/04/1926  MRN [id number].  Admission date:  12/19/2021  Admitting Physician  Bethena Roys, MD  Discharge Date:  12/20/2021   Primary MD  Dettinger, Fransisca Kaufmann, MD  Recommendations for primary care physician for things to follow:   1)Follow-up with primary care physician (Dettinger, Fransisca Kaufmann, MD) as outpatient-- -Primary care physician to consider echocardiogram and serum cortisol level (Morning sample) if orthostatic dizziness and orthostatic hypotension reocur/persist  2) stop hydralazine for now until BP is rechecked by primary care physician  3) staple removal from scalp wound by primary care physician in about 10 days (after your COVID quarantine)  4)You are strongly advised to isolate/quarantine for at least 10 days from the date of your diagnosis with COVID-19 infection--please always wear a mask if you have to go outside the house  5)Please take  medications including your COVID medication as prescribed   Admission Diagnosis  Orthostatic hypotension [I95.1] Weakness [R53.1] COVID [U07.1]   Discharge Diagnosis  Orthostatic hypotension [I95.1] Weakness [R53.1] COVID [U07.1]    Principal Problem:   Orthostatic hypotension Active Problems:   Falls   COVID-19 virus infection   Occipital scalp laceration   Hypertension      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 benign tumor       HPI  from the history and physical done on the day of admission:     Chief Complaint: Falls   HPI: Kristi Sawyer is a 88 y.o. female with medical history significant for HTN, presented to the ED with reports of fall.  Patient reports she fell 3 times today.  She did not hit at least once when she fell backwards.  She reports last night she was feeling a little ill, this morning she woke up with nausea without vomiting, and she had a cough.  No fevers, no chills.  No difficulty breathing.  She reports good oral intake.  No  diarrhea. At baseline she uses a walker for assistance with ambulation.  She still drives.  She lives alone. She reports history of intermittent dizziness when getting up but this usually self resolves within a minute.   ED Course: Afebrile, blood pressure 140s to 171.  Temperature 98.8.  Heart rate 80-93.  Blood pressure 140s to 160s.  Orthostatic vitals positive, blood pressure systolic dropping from 852-778, diastolic dropping from 24-23. 1 L bolus given. Head CT and subsequent MRI brain-evidence of acute abnormality, shows chronic microvascular ischemic disease and cerebral atrophy.     Review of Systems: As per HPI all other systems reviewed and negative.     Hospital Course:     Assessment and Plan:   1) orthostatic hypotension leading to recurrent falls-- -Orthostatic vitals normalized after IV fluids--- please see vital sign flowsheet for orthostatic vital sign results -CT head and MRI brain without acute findings -On telemetry monitor no significant arrhythmias -Physical therapy eval appreciated recommend home health PT -Primary care physician to consider  echocardiogram and serum cortisol level (Morning sample) if orthostatic dizziness and orthostatic hypotension reocur/persist -Okay to hold hydralazine, -May resume amlodipine in the a.m. -No chest pain, no pleuritic symptoms, D-dimer when adjusted for age is not related elevated -Low clinical index of suspicion for PE  2) COVID-19 infection---  no fevers no dyspnea, no hypoxia -Chest x-ray without acute findings -Inflammatory markers noted -Patient is vaccinated and boosted -Mild cough otherwise no significant respiratory symptoms -Discharge home on Molnupiravir -No indication for steroids at this time  3)HTN--BP running higher due to discontinuation of amlodipine and hydralazine -BP medication adjust as above #1  4) social/ethics--- PTA patient lives alone, drives independently -PT eval appreciated -Okay to discharge home with home health PT given weakness and orthostatic concerns in the setting of acute COVID-19 infection   Discharge Condition: Stable, hemodynamically stable  Follow UP   Follow-up Information     Dettinger, Fransisca Kaufmann, MD Follow up in 10 day(s).   Specialties: Family Medicine, Cardiology Contact information: [place] [address] [phone]                Diet and Activity recommendation:  As advised  Discharge Instructions    Discharge Instructions     Call MD for:  difficulty breathing, headache or visual disturbances   Complete by: As directed    Call MD for:  persistant dizziness or light-headedness   Complete by: As directed    Call MD for:  persistant nausea and vomiting   Complete by: As directed    Call MD for:  redness, tenderness, or signs of infection (pain, swelling, redness, odor or green/yellow discharge around incision site)   Complete by: As directed    Call MD for:  temperature >100.4   Complete by: As directed    Diet - low sodium heart healthy   Complete by: As directed    Discharge instructions   Complete  by: As directed    1)Follow-up with primary care physician (Dettinger, Fransisca Kaufmann, MD) as outpatient-- -Primary care physician to consider echocardiogram and serum cortisol level (Morning sample) if orthostatic dizziness and orthostatic hypotension reocur/persist  2) stop hydralazine for now until BP is rechecked by primary care physician  3) staple removal from scalp wound by primary care physician in about 10 days (after your COVID quarantine)  4)You are strongly advised to isolate/quarantine for at least 10 days from the date of your diagnosis with COVID-19 infection--please always wear  a mask if you have to go outside the house  5)Please take  medications including your COVID medication as prescribed   Discharge wound care:   Complete by: As directed    Keep occipital/scalp wound clean and dry   Increase activity slowly   Complete by: As directed          Discharge Medications     Allergies as of 12/20/2021       Reactions   Prevnar 13 [pneumococcal 13-val Conj Vacc] Hives   Red/streaking arm   Penicillins Hives   Has patient had a PCN reaction causing immediate rash, facial/tongue/throat swelling, SOB or lightheadedness with hypotension: No Has patient had a PCN reaction causing severe rash involving mucus membranes or skin necrosis: No Has patient had a PCN reaction that required hospitalization: No Has patient had a PCN reaction occurring within the last 10 years: No If all of the above answers are "NO", then may proceed with Cephalosporin use.   Tetanus Toxoids Other (See Comments)   Area of injection was red and streaked.   Codeine Nausea Only   Sulfonamide Derivatives Nausea Only        Medication List     STOP taking these medications    hydrALAZINE 10 MG tablet Commonly known as: APRESOLINE       TAKE these medications    acetaminophen 325 MG tablet Commonly known as: TYLENOL Take 2 tablets (650 mg total) by mouth every 6 (six) hours as needed for  mild pain (or Fever >/= 101).   albuterol 108 (90 Base) MCG/ACT inhaler Commonly known as: VENTOLIN HFA Inhale 2 puffs into the lungs 2 (two) times daily.   amLODipine 5 MG tablet Commonly known as: NORVASC Take 1 tablet (5 mg total) by mouth daily. Start taking on: December 21, 2021   ascorbic acid 500 MG tablet Commonly known as: VITAMIN C Take 1 tablet (500 mg total) by mouth daily. Start taking on: December 21, 2021   CALTRATE PLUS PO Take 1 tablet by mouth daily.   CENTRUM SILVER PO Take 1 tablet by mouth daily.   denosumab 60 MG/ML Sosy injection Commonly known as: PROLIA Inject 60 mg into the skin every 6 (six) months.   Fish Oil 1000 MG Caps Take 1 capsule by mouth 3 (three) times daily.   Integra 62.5-62.5-40-3 MG Caps TAKE 1 CAPSULE BY MOUTH EVERY DAY   Inulin 1.5 g Chew Chew 5 tablets by mouth daily. FIBER CHOICE   metoCLOPramide 10 MG tablet Commonly known as: REGLAN TAKE 1 TABLET BY MOUTH EVERYDAY AT BEDTIME What changed: See the new instructions.   mirabegron ER 50 MG Tb24 tablet Commonly known as: Myrbetriq Take 1 tablet (50 mg total) by mouth daily.   molnupiravir EUA 200 mg Caps capsule Commonly known as: LAGEVRIO Take 4 capsules (800 mg total) by mouth 2 (two) times daily for 5 days.   omeprazole 20 MG capsule Commonly known as: PRILOSEC Take 1-2 capsules (20-40 mg total) by mouth daily.   rosuvastatin 5 MG tablet Commonly known as: CRESTOR TAKE 1 TABLET BY MOUTH ON MONDAY, WEDNESDAY, AND FRIDAY What changed: See the new instructions.   Vitamin D 50 MCG (2000 UT) Caps Take by mouth as directed. Take 1 tablet daily except none on saturdays and sundays   zinc sulfate 220 (50 Zn) MG capsule Take 1 capsule (220 mg total) by mouth daily. Start taking on: December 21, 2021  Discharge Care Instructions  (From admission, onward)           Start     Ordered   12/20/21 0000  Discharge wound care:       Comments: Keep  occipital/scalp wound clean and dry   12/20/21 1616            Major procedures and Radiology Reports - PLEASE review detailed and final reports for all details, in brief -    DG Chest 1 View  Result Date: 12/19/2021 CLINICAL DATA:  Cough.  Fall. EXAM: CHEST  1 VIEW COMPARISON:  AP chest 10/26/2017 FINDINGS: Cardiac silhouette and mediastinal contours are within normal limits. Calcification is again seen within the aortic arch. Mild chronic bilateral interstitial thickening. No focal airspace opacity to indicate pneumonia. No pulmonary edema, pleural effusion, or pneumothorax. Mild multilevel degenerative disc changes of the thoracic spine. IMPRESSION: No acute cardiopulmonary disease process. Electronically Signed   By: Yvonne Kendall M.D.   On: 12/19/2021 14:50   CT Head Wo Contrast  Result Date: 12/19/2021 CLINICAL DATA:  Posterior head laceration after falling backwards and hitting her head on a table. EXAM: CT HEAD WITHOUT CONTRAST TECHNIQUE: Contiguous axial images were obtained from the base of the skull through the vertex without intravenous contrast. RADIATION DOSE REDUCTION: This exam was performed according to the departmental dose-optimization program which includes automated exposure control, adjustment of the mA and/or kV according to patient size and/or use of iterative reconstruction technique. COMPARISON:  09/09/2021.  Brain MR dated 09/22/2021. FINDINGS: Brain: Stable moderately enlarged ventricles and subarachnoid spaces. Stable moderate patchy white matter low density in both cerebral hemispheres. No intracranial hemorrhage, mass lesion or CT evidence of acute infarction. Vascular: No hyperdense vessel or unexpected calcification. Skull: Normal. Negative for fracture or focal lesion. Sinuses/Orbits: Unremarkable. 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 IMPRESSION: 1. No skull fracture or intracranial hemorrhage. 2. Stable atrophy and chronic small  vessel white matter ischemic changes. Electronically Signed   By: Claudie Revering M.D.   On: 12/19/2021 14:58   MR BRAIN WO CONTRAST  Result Date: 12/19/2021 CLINICAL DATA:  Transient ischemic attack (TIA) EXAM: MRI HEAD WITHOUT CONTRAST TECHNIQUE: Multiplanar, multiecho pulse sequences of the brain and surrounding structures were obtained without intravenous contrast. COMPARISON:  MRI September 22, 2021. FINDINGS: Brain: No acute infarction, hemorrhage, hydrocephalus, extra-axial collection or mass lesion. Similar mild to moderate for age scattered T2/FLAIR hyperintensities in the white matter, nonspecific but compatible with chronic microvascular ischemic disease. Similar cerebral atrophy. Vascular: Major arterial flow voids are maintained at the skull base. Skull and upper cervical spine: Normal marrow signal. Sinuses/Orbits: Negative. Other: No mastoid effusions. IMPRESSION: 1. No evidence of acute abnormality. 2. Similar chronic microvascular ischemic disease and cerebral atrophy (ICD10-G31.9). Electronically Signed   By: Margaretha Sheffield M.D.   On: 12/19/2021 17:55    Micro Results   Recent Results (from the past 240 hour(s))  Resp Panel by RT-PCR (Flu A&B, Covid) Nasopharyngeal Swab     Status: Abnormal   Collection Time: 12/19/21  2:22 PM   Specimen: Nasopharyngeal Swab; Nasopharyngeal(NP) swabs in vial transport medium  Result Value Ref Range Status   SARS Coronavirus 2 by RT PCR POSITIVE (A) NEGATIVE Final    Comment: (NOTE) SARS-CoV-2 target nucleic acids are DETECTED.  The SARS-CoV-2 RNA is generally detectable in upper respiratory specimens during the acute phase of infection. Positive results are indicative of the presence of the identified virus, but do not rule  out bacterial infection or co-infection with other pathogens not detected by the test. Clinical correlation with patient history and other diagnostic information is necessary to determine patient infection status. The  expected result is Negative.  Fact Sheet for Patients: EntrepreneurPulse.com.au  Fact Sheet for Healthcare Providers: IncredibleEmployment.be  This test is not yet approved or cleared by the [place] FDA and  has been authorized for detection and/or diagnosis of SARS-CoV-2 by FDA under an Emergency Use Authorization (EUA).  This EUA will remain in effect (meaning this test can be used) for the duration of  the COVID-19 declaration under Section 564(b)(1) of the A ct, 21 U.S.C. section 360bbb-3(b)(1), unless the authorization is terminated or revoked sooner.     Influenza A by PCR NEGATIVE NEGATIVE Final   Influenza B by PCR NEGATIVE NEGATIVE Final    Comment: (NOTE) The Xpert Xpress SARS-CoV-2/FLU/RSV plus assay is intended as an aid in the diagnosis of influenza from Nasopharyngeal swab specimens and should not be used as a sole basis for treatment. Nasal washings and aspirates are unacceptable for Xpert Xpress SARS-CoV-2/FLU/RSV testing.  Fact Sheet for Patients: EntrepreneurPulse.com.au  Fact Sheet for Healthcare Providers: IncredibleEmployment.be  This test is not yet approved or cleared by the [place] FDA and has been authorized for detection and/or diagnosis of SARS-CoV-2 by FDA under an Emergency Use Authorization (EUA). This EUA will remain in effect (meaning this test can be used) for the duration of the COVID-19 declaration under Section 564(b)(1) of the Act, 21 U.S.C. section 360bbb-3(b)(1), unless the authorization is terminated or revoked.  Performed at [facility], [address], [place], [place] [address]        Today   Subjective    Tirsa Gail today has no new concerns  No fever  Or chills   No Nausea, Vomiting or Diarrhea         Patient has been seen and examined prior to discharge   Objective   Blood pressure (!) 150/74, pulse 72, temperature 99.6 F (37.6  C), temperature source Oral, resp. rate 18, height 5' (1.524 m), weight 44 kg, SpO2 98 %.   Intake/Output Summary (Last 24 hours) at 12/20/2021 1620 Last data filed at 12/20/2021 1500 Gross per 24 hour  Intake 772.52 ml  Output 550 ml  Net 222.52 ml    Exam Gen:- Awake Alert, no acute distress  HEENT:- [place].AT, No sclera icterus, hemostatic occipital area laceration with intact staple Neck-Supple Neck,No JVD,.  Lungs-  CTAB , good air movement bilaterally  CV- S1, S2 normal, regular Abd-  +ve B.Sounds, Abd Soft, No tenderness,    Extremity/Skin:- No  edema,   good pulses Psych-affect is appropriate, oriented x3 Neuro-generalized weakness, no new focal deficits, no tremors    Data Review   CBC w Diff:  Lab Results  Component Value Date   WBC 8.0 12/20/2021   HGB 13.0 12/20/2021   HGB 14.5 10/18/2021   HCT 39.0 12/20/2021   HCT 41.2 10/18/2021   PLT 194 12/20/2021   PLT 249 10/18/2021   LYMPHOPCT 14 12/20/2021   MONOPCT 17 12/20/2021   EOSPCT 0 12/20/2021   BASOPCT 0 12/20/2021    CMP:  Lab Results  Component Value Date   NA 139 12/20/2021   NA 136 10/18/2021   K 3.7 12/20/2021   CL 107 12/20/2021   CO2 24 12/20/2021   BUN 12 12/20/2021   BUN 12 10/18/2021   CREATININE 0.47 12/20/2021   CREATININE 0.65 06/03/2013  PROT 5.9 (L) 12/20/2021   PROT 6.6 10/18/2021   ALBUMIN 3.5 12/20/2021   ALBUMIN 4.5 10/18/2021   BILITOT 0.5 12/20/2021   BILITOT 0.4 10/18/2021   ALKPHOS 34 (L) 12/20/2021   AST 18 12/20/2021   ALT 17 12/20/2021  .   Total Discharge time is about 33 minutes  Roxan Hockey M.D on 12/20/2021 at 4:20 PM  Go to [email or web address] -  for contact info  [facility] - Office  [phone]

## 2021-12-20 NOTE — TOC Transition Note (Addendum)
Transition of Care [facility]) - CM/SW Discharge Note   Patient Details  Name: Kristi Sawyer MRN: [id number] Date of Birth: 1925/12/23  [facility]) CM/SW Contact:  Boneta Lucks, RN Phone Number: 12/20/2021, 4:46 PM   Clinical Narrative:   Patient discharging home. PT recommended home health. TOC referred out multiple home health agencies, waiting to hear back. Orders have been placed, TOC will follow tomorrow for  reply for home health agencies.   Addendum : [name] accepted the referral for HHPT. Start of care next week.   Final next level of care: [place] Barriers to Discharge: Barriers Resolved   Patient Goals and CMS Choice Patient states their goals for this hospitalization and ongoing recovery are:: to go home. CMS Medicare.gov Compare Post Acute Care list provided to:: Patient Choice offered to / list presented to : Patient  Discharge Placement        Name of family member notified: Friend Patient and family notified of of transfer: 12/20/21  Discharge Plan and Between

## 2021-12-20 NOTE — Evaluation (Signed)
Physical Therapy Evaluation Patient Details Name: Kristi Sawyer MRN: [id number] DOB: 02/19/1926 Today's Date: 12/20/2021  History of Present Illness  Kristi Sawyer is a 88 y.o. female with medical history significant for HTN, presented to the ED with reports of fall.  Patient reports she fell 3 times today.  She did not hit at least once when she fell backwards.  She reports last night she was feeling a little ill, this morning she woke up with nausea without vomiting, and she had a cough.  No fevers, no chills.  No difficulty breathing.  She reports good oral intake.  No diarrhea.  At baseline she uses a walker for assistance with ambulation.  She still drives.  She lives alone.  She reports history of intermittent dizziness when getting up but this usually self resolves within a minute.   Clinical Impression  Patient functioning near baseline for functional mobility and gait other than requiring use of RW due to unsteady without AD.  Patient demonstrates good return for ambulation in room using RW without loss of balance and transferring to/from [facility].  Patient will benefit from continued skilled physical therapy in hospital and recommended venue below to increase strength, balance, endurance for safe ADLs and gait.         Recommendations for follow up therapy are one component of a multi-disciplinary discharge planning process, led by the attending physician.  Recommendations may be updated based on patient status, additional functional criteria and insurance authorization.  Follow Up Recommendations Home health PT    Assistance Recommended at Discharge Set up Supervision/Assistance  Patient can return home with the following  A little help with walking and/or transfers;A little help with bathing/dressing/bathroom;Assistance with cooking/housework;Help with stairs or ramp for entrance    Equipment Recommendations None recommended by PT  Recommendations for Other Services       Functional  Status Assessment Patient has had a recent decline in their functional status and demonstrates the ability to make significant improvements in function in a reasonable and predictable amount of time.     Precautions / Restrictions Precautions Precautions: Fall Restrictions Weight Bearing Restrictions: No      Mobility  Bed Mobility Overal bed mobility: Modified Independent                  Transfers Overall transfer level: Modified independent                      Ambulation/Gait Ambulation/Gait assistance: Supervision Gait Distance (Feet): 55 Feet Assistive device: Rolling walker (2 wheels) Gait Pattern/deviations: Decreased step length - right, Decreased step length - left, Decreased stride length Gait velocity: decreased     General Gait Details: unsteady on feet having to lean on nearby objects for support, required use of RW and demonstrated good return for ambulation in room without loss of balance  Stairs            Wheelchair Mobility    Modified Rankin (Stroke Patients Only)       Balance Overall balance assessment: Needs assistance Sitting-balance support: Feet supported, No upper extremity supported Sitting balance-Leahy Scale: Good Sitting balance - Comments: seated at EOB   Standing balance support: During functional activity, No upper extremity supported Standing balance-Leahy Scale: Poor Standing balance comment: fair/poor without AD, fair/good using RW                             Pertinent  Vitals/Pain Pain Assessment Pain Assessment: No/denies pain    Home Living Family/patient expects to be discharged to:: Private residence Living Arrangements: Alone Available Help at Discharge: Friend(s);Available PRN/intermittently Type of Home: House Home Access: Stairs to enter Entrance Stairs-Rails: Right;Left;Can reach both Entrance Stairs-Number of Steps: 2-3   Home Layout: One level Home Equipment: BSC/3in1;Cane -  single point;Rolling Walker (2 wheels);Shower seat;Grab bars - tub/shower      Prior Function Prior Level of Function : Independent/Modified Independent             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, drives, shops ADLs Comments: Independent     Hand Dominance        Extremity/Trunk Assessment   Upper Extremity Assessment Upper Extremity Assessment: Overall WFL for tasks assessed    Lower Extremity Assessment Lower Extremity Assessment: Generalized weakness    Cervical / Trunk Assessment Cervical / Trunk Assessment: Normal;Other exceptions;Lordotic Cervical / Trunk Exceptions: neck  Communication   Communication: No difficulties  Cognition Arousal/Alertness: Awake/alert Behavior During Therapy: WFL for tasks assessed/performed Overall Cognitive Status: Within Functional Limits for tasks assessed                                          General Comments      Exercises     Assessment/Plan    PT Assessment Patient needs continued PT services  PT Problem List Decreased strength;Decreased activity tolerance;Decreased balance;Decreased mobility       PT Treatment Interventions DME instruction;Gait training;Stair training;Functional mobility training;Therapeutic activities;Therapeutic exercise;Balance training;Patient/family education    PT Goals (Current goals can be found in the Care Plan section)  Acute Rehab PT Goals Patient Stated Goal: return home with friends to assist PT Goal Formulation: With patient Time For Goal Achievement: 12/23/21 Potential to Achieve Goals: Good    Frequency Min 2X/week     Co-evaluation               AM-PAC PT "6 Clicks" Mobility  Outcome Measure Help needed turning from your back to your side while in a flat bed without using bedrails?: None Help needed moving from lying on your back to sitting on the side of a flat bed without using bedrails?: None Help needed moving to and from  a bed to a chair (including a wheelchair)?: A Little Help needed standing up from a chair using your arms (e.g., wheelchair or bedside chair)?: A Little Help needed to walk in hospital room?: A Little Help needed climbing 3-5 steps with a railing? : A Little 6 Click Score: 20    End of Session   Activity Tolerance: Patient tolerated treatment well;Patient limited by fatigue Patient left: in bed;with call bell/phone within reach Nurse Communication: Mobility status PT Visit Diagnosis: Unsteadiness on feet (R26.81);Other abnormalities of gait and mobility (R26.89);Muscle weakness (generalized) (M62.81)    Time: 2620-3559 PT Time Calculation (min) (ACUTE ONLY): 27 min   Charges:   PT Evaluation $PT Eval Moderate Complexity: 1 Mod PT Treatments $Therapeutic Activity: 23-37 mins        4:48 PM, 12/20/21 Lonell Grandchild, MPT Physical Therapist with [facility] [phone] office [phone] mobile phone

## 2021-12-20 NOTE — Discharge Instructions (Signed)
1)Follow-up with primary care physician (Dettinger, Fransisca Kaufmann, MD) as outpatient-- -Primary care physician to consider echocardiogram and serum cortisol level (Morning sample) if orthostatic dizziness and orthostatic hypotension reocur/persist  2) stop hydralazine for now until BP is rechecked by primary care physician  3) staple removal from scalp wound by primary care physician in about 10 days (after your COVID quarantine)  4)You are strongly advised to isolate/quarantine for at least 10 days from the date of your diagnosis with COVID-19 infection--please always wear a mask if you have to go outside the house  5)Please take  medications including your COVID medication as prescribed

## 2021-12-20 NOTE — Progress Notes (Signed)
°  Transition of Care [facility]) Screening Note   Patient Details  Name: Kristi Sawyer Date of Birth: [birth year]   [facility] CM/SW Contact:    Boneta Lucks, RN Phone Number: 12/20/2021, 2:09 PM    Transition of Care Department [facility] has reviewed patient and no TOC needs have been identified at this time. We will continue to monitor patient advancement through interdisciplinary progression rounds. If new patient transition needs arise, please place a TOC consult.

## 2021-12-20 NOTE — Plan of Care (Signed)
°  Problem: Acute Rehab PT Goals(only PT should resolve) Goal: Pt Will Go Supine/Side To Sit Outcome: Progressing Flowsheets (Taken 12/20/2021 1649) Pt will go Supine/Side to Sit:  Independently  with modified independence Goal: Patient Will Transfer Sit To/From Stand Outcome: Progressing Flowsheets (Taken 12/20/2021 1649) Patient will transfer sit to/from stand:  Independently  with modified independence Goal: Pt Will Transfer Bed To Chair/Chair To Bed Outcome: Progressing Flowsheets (Taken 12/20/2021 1649) Pt will Transfer Bed to Chair/Chair to Bed:  Independently  with modified independence Goal: Pt Will Ambulate Outcome: Progressing Flowsheets (Taken 12/20/2021 1649) Pt will Ambulate:  100 feet  with supervision  with standard walker  with modified independence   4:49 PM, 12/20/21 Lonell Grandchild, MPT Physical Therapist with [facility] [phone] office [phone] mobile phone

## 2021-12-21 ENCOUNTER — Telehealth: Payer: Self-pay

## 2021-12-21 ENCOUNTER — Telehealth: Payer: Self-pay | Admitting: Family Medicine

## 2021-12-21 ENCOUNTER — Ambulatory Visit: Payer: Medicare Other | Admitting: Family Medicine

## 2021-12-21 NOTE — Telephone Encounter (Signed)
HOSPITAL FOLLOW UP SCHEDULED

## 2021-12-21 NOTE — Telephone Encounter (Signed)
Transition Care Management Follow-up Telephone Call Date of discharge and from where:[facility] [place] 12-20-21 Dx: COVID   How have you been since you were released from the hospital? Doing ok  Any questions or concerns? No  Items Reviewed: Did the pt receive and understand the discharge instructions provided? Yes  Medications obtained and verified? Yes  Other? No  Any new allergies since your discharge? no Dietary orders reviewed? Yes Do you have support at home? Yes   Home Care and Equipment/Supplies: Were home health services ordered? yes If so, what is the name of the agency? Unknown   Has the agency set up a time to come to the patient's home? no Were any new equipment or medical supplies ordered?  No What is the name of the medical supply agency? na Were you able to get the supplies/equipment? not applicable Do you have any questions related to the use of the equipment or supplies? No  Functional Questionnaire: (I = Independent and D = Dependent) ADLs: I  Bathing/Dressing- I  Meal Prep- I  Eating- I  Maintaining continence- I  Transferring/Ambulation- I  Managing Meds- I  Follow up appointments reviewed:  PCP Hospital f/u appt confirmed? Yes  Scheduled to see Dr Blanch Media  [facility]? No . Are transportation arrangements needed? No  If their condition worsens, is the pt aware to call PCP or go to the Emergency Dept.? Yes Was the patient provided with contact information for the PCP's office or ED? Yes Was to pt encouraged to call back with questions or concerns? Yes

## 2021-12-27 DIAGNOSIS — W19XXXA Unspecified fall, initial encounter: Secondary | ICD-10-CM | POA: Diagnosis not present

## 2021-12-27 DIAGNOSIS — U071 COVID-19: Secondary | ICD-10-CM | POA: Diagnosis not present

## 2021-12-27 DIAGNOSIS — S0101XA Laceration without foreign body of scalp, initial encounter: Secondary | ICD-10-CM | POA: Diagnosis not present

## 2021-12-27 DIAGNOSIS — I951 Orthostatic hypotension: Secondary | ICD-10-CM | POA: Diagnosis not present

## 2021-12-27 DIAGNOSIS — R531 Weakness: Secondary | ICD-10-CM | POA: Diagnosis not present

## 2021-12-29 ENCOUNTER — Encounter: Payer: Self-pay | Admitting: Family Medicine

## 2021-12-29 ENCOUNTER — Ambulatory Visit (INDEPENDENT_AMBULATORY_CARE_PROVIDER_SITE_OTHER): Payer: Medicare Other | Admitting: Family Medicine

## 2021-12-29 VITALS — BP 138/84 | HR 87 | Temp 97.8°F | Ht 60.0 in | Wt 94.2 lb

## 2021-12-29 DIAGNOSIS — Z09 Encounter for follow-up examination after completed treatment for conditions other than malignant neoplasm: Secondary | ICD-10-CM

## 2021-12-29 DIAGNOSIS — R531 Weakness: Secondary | ICD-10-CM

## 2021-12-29 DIAGNOSIS — I951 Orthostatic hypotension: Secondary | ICD-10-CM

## 2021-12-29 DIAGNOSIS — U071 COVID-19: Secondary | ICD-10-CM

## 2021-12-29 DIAGNOSIS — R42 Dizziness and giddiness: Secondary | ICD-10-CM

## 2021-12-29 NOTE — Progress Notes (Signed)
Assessment & Plan:  1. COVID-19 Patient has completed antivirals and is out of quarantine.  2. Orthostatic hypotension Resolved. Echocardiogram and cortisol level not ordered.  3-4. Dizziness/Generalized weakness Encouraged adequate hydration and to continue working with physical therapy. Patient will continue to use her walker for safety.   [facility] discharge follow-up   Return if symptoms worsen or fail to improve.  Hendricks Limes, MSN, APRN, FNP-C [facility]  Subjective:    Patient ID: Kristi Sawyer, female    DOB: 11-04-1926, 88 y.o.   MRN: [id number]  Patient Care Team: Dettinger, Fransisca Kaufmann, MD as PCP - General (Family Medicine) Gatha Mayer, MD as Consulting Physician (Gastroenterology) Zadie Rhine Clent Demark, MD as Consulting Physician (Ophthalmology) Ilean China, RN as Registered Nurse Irine Seal, MD as Attending Physician (Urology) Allyn Kenner, MD (Dermatology) Celestia Khat, [place] (Optometry)   Chief Complaint:  Chief Complaint  Patient presents with   Transitions Of Care    HPI: Kristi Sawyer is a 88 y.o. female presenting on 12/29/2021 for Transitions Of Care  Patient is here for a hospital discharge follow-up. She was admitted to [facility] 12/19/2021-12/20/2021 due to orthostatic hypotension and COVID-19. Her orthostatic vitals normalized after IV fluids. It was recommended that if dizziness continues to consider an echocardiogram and serum cortisol level. Her CT head and MRI brain (completed due to multiple falls prior to admission) did not show any acute findings. It was recommended she have [facility] which have been out to the house. They came Tuesday and are suppose to come this afternoon. She completed the coarse of Molnupiravir. She has resumed amlodipine, but not hydralazine for blood pressure. She has one staple in her scalp that is due to come out today.   She reports today she feels terrible and even walking with the walker she feels  like she is going to pass out and fall. She is still experiencing dizziness.    Social history:  Relevant past medical, surgical, family and social history reviewed and updated as indicated. Interim medical history since our last visit reviewed.  Allergies and medications reviewed and updated.  DATA REVIEWED: CHART IN EPIC  ROS: Negative unless specifically indicated above in HPI.    Current Outpatient Medications:    acetaminophen (TYLENOL) 325 MG tablet, Take 2 tablets (650 mg total) by mouth every 6 (six) hours as needed for mild pain (or Fever >/= 101)., Disp: 12 tablet, Rfl: 0   albuterol (VENTOLIN HFA) 108 (90 Base) MCG/ACT inhaler, Inhale 2 puffs into the lungs 2 (two) times daily., Disp: 18 g, Rfl: 0   amLODipine (NORVASC) 5 MG tablet, Take 1 tablet (5 mg total) by mouth daily., Disp: 90 tablet, Rfl: 3   ascorbic acid (VITAMIN C) 500 MG tablet, Take 1 tablet (500 mg total) by mouth daily., Disp: 30 tablet, Rfl: 2   Calcium Carbonate-Vit D-Min (CALTRATE PLUS PO), Take 1 tablet by mouth daily., Disp: , Rfl:    Cholecalciferol (VITAMIN D) 2000 UNITS CAPS, Take by mouth as directed. Take 1 tablet daily except none on saturdays and sundays, Disp: , Rfl:    denosumab (PROLIA) 60 MG/ML SOSY injection, Inject 60 mg into the skin every 6 (six) months., Disp: , Rfl:    Fe Fum-FePoly-Vit C-Vit B3 (INTEGRA) 62.5-62.5-40-3 MG CAPS, TAKE 1 CAPSULE BY MOUTH EVERY DAY, Disp: 30 capsule, Rfl: 1   Inulin 1.5 g CHEW, Chew 5 tablets by mouth daily. FIBER CHOICE, Disp: , Rfl:  metoCLOPramide (REGLAN) 10 MG tablet, TAKE 1 TABLET BY MOUTH EVERYDAY AT BEDTIME (Patient taking differently: Take 10 mg by mouth at bedtime.), Disp: 90 tablet, Rfl: 0   mirabegron ER (MYRBETRIQ) 50 MG TB24 tablet, Take 1 tablet (50 mg total) by mouth daily., Disp: 90 tablet, Rfl: 3   Multiple Vitamins-Minerals (CENTRUM SILVER PO), Take 1 tablet by mouth daily., Disp: , Rfl:    Omega-3 Fatty Acids (FISH OIL) 1000 MG CAPS, Take  1 capsule by mouth 3 (three) times daily. , Disp: , Rfl:    omeprazole (PRILOSEC) 20 MG capsule, Take 1-2 capsules (20-40 mg total) by mouth daily., Disp: 180 capsule, Rfl: 3   rosuvastatin (CRESTOR) 5 MG tablet, TAKE 1 TABLET BY MOUTH ON MONDAY, WEDNESDAY, AND FRIDAY (Patient taking differently: Take 5 mg by mouth daily. Monday,Wednesday and Friday), Disp: 36 tablet, Rfl: 1   zinc sulfate 220 (50 Zn) MG capsule, Take 1 capsule (220 mg total) by mouth daily., Disp: 30 capsule, Rfl: 1   Allergies  Allergen Reactions   Prevnar 13 [Pneumococcal 13-Val Conj Vacc] Hives    Red/streaking arm   Penicillins Hives    Has patient had a PCN reaction causing immediate rash, facial/tongue/throat swelling, SOB or lightheadedness with hypotension: No Has patient had a PCN reaction causing severe rash involving mucus membranes or skin necrosis: No Has patient had a PCN reaction that required hospitalization: No Has patient had a PCN reaction occurring within the last 10 years: No If all of the above answers are "NO", then may proceed with Cephalosporin use.    Tetanus Toxoids Other (See Comments)    Area of injection was red and streaked.   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 [employer] 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 yrs - retiredWalks daily - exercise   Close with her large church family   Still driving and very independent   Social Determinants of Radio broadcast assistant Strain: Low Risk    Difficulty of Paying Living Expenses: Not very hard  Food Insecurity: No Food Insecurity   Worried About Charity fundraiser in the Last Year: Never true   Ran Out of Food in the Last Year: Never true  Transportation Needs: No Transportation Needs   Lack of Transportation (Medical): No   Lack of Transportation (Non-Medical): No  Physical Activity: Insufficiently Active   Days of Exercise per Week: 5 days   Minutes of Exercise per Session: 20 min  Stress: No Stress Concern Present   Feeling of Stress : Only a little  Social Connections: Moderately Integrated   Frequency of Communication with Friends and Family: More than three times a week   Frequency of Social Gatherings with Friends and Family: More than three times a week   Attends Religious Services: More than 4 times per year   Active Member of [employer] or Organizations: Yes   Attends Archivist Meetings: More than 4 times per year   Marital Status: Widowed  Human resources officer Violence: Not At Risk   Fear of Current or Ex-Partner:  No   Emotionally Abused: No   Physically Abused: No   Sexually Abused: No        Objective:    BP 138/84    Pulse 87    Temp 97.8 F (36.6 C)    Ht 5' (1.524 m)    Wt 94 lb 3.2 oz (42.7 kg)    SpO2 95%    BMI 18.40 kg/m   Orthostatic Vitals for the past 48 hrs (Last 6 readings):  Patient Position Orthostatic BP  12/29/21 1131 Standing 139/85  12/29/21 1132 Sitting (!) 151/96  12/29/21 1133 Supine (!) 156/97   Wt Readings from Last 3 Encounters:  12/29/21 94 lb 3.2 oz (42.7 kg)  12/19/21 97 lb (44 kg)  12/14/21 96 lb (43.5 kg)   Physical Exam Vitals reviewed.  Constitutional:      General: She is not in acute distress.    Appearance: Normal appearance. She is not ill-appearing, toxic-appearing or diaphoretic.  HENT:     Head: Normocephalic and atraumatic.  Eyes:     General: No scleral icterus.       Right eye: No discharge.        Left eye: No discharge.     Conjunctiva/sclera: Conjunctivae normal.  Cardiovascular:     Rate and Rhythm: Normal rate and regular rhythm.     Heart sounds: Normal heart sounds. No murmur heard.   No friction rub. No gallop.  Pulmonary:     Effort: Pulmonary effort is normal. No respiratory distress.     Breath sounds: Normal breath sounds. No stridor. No wheezing, rhonchi or rales.  Musculoskeletal:        General: Normal range of motion.     Cervical back: Normal range of motion.  Skin:    General: Skin is warm and dry.     Capillary Refill: Capillary refill takes less than 2 seconds.  Neurological:     General: No focal deficit present.     Mental Status: She is alert and oriented to person, place, and time. Mental status is at baseline.  Motor: Weakness present.     Gait: Gait abnormal (walking with walker).  Psychiatric:        Mood and Affect: Mood normal.        Behavior: Behavior normal.        Thought Content: Thought content normal.        Judgment: Judgment normal.    Lab Results  Component Value Date   TSH 3.460  10/18/2021   Lab Results  Component Value Date   WBC 8.0 12/20/2021   HGB 13.0 12/20/2021   HCT 39.0 12/20/2021   MCV 95.1 12/20/2021   PLT 194 12/20/2021   Lab Results  Component Value Date   NA 139 12/20/2021   K 3.7 12/20/2021   CO2 24 12/20/2021   GLUCOSE 104 (H) 12/20/2021   BUN 12 12/20/2021   CREATININE 0.47 12/20/2021   BILITOT 0.5 12/20/2021   ALKPHOS 34 (L) 12/20/2021   AST 18 12/20/2021   ALT 17 12/20/2021   PROT 5.9 (L) 12/20/2021   ALBUMIN 3.5 12/20/2021   CALCIUM 8.8 (L) 12/20/2021   ANIONGAP 8 12/20/2021   EGFR 77 10/18/2021   Lab Results  Component Value Date   CHOL 204 (H) 10/18/2021   Lab Results  Component Value Date   HDL 84 10/18/2021   Lab Results  Component Value Date   LDLCALC 105 (H) 10/18/2021   Lab Results  Component Value Date   TRIG 86 10/18/2021   Lab Results  Component Value Date   CHOLHDL 2.4 10/18/2021   No results found for: HGBA1C

## 2022-01-03 DIAGNOSIS — S0101XA Laceration without foreign body of scalp, initial encounter: Secondary | ICD-10-CM | POA: Diagnosis not present

## 2022-01-03 DIAGNOSIS — I951 Orthostatic hypotension: Secondary | ICD-10-CM | POA: Diagnosis not present

## 2022-01-03 DIAGNOSIS — U071 COVID-19: Secondary | ICD-10-CM | POA: Diagnosis not present

## 2022-01-03 DIAGNOSIS — R531 Weakness: Secondary | ICD-10-CM | POA: Diagnosis not present

## 2022-01-03 DIAGNOSIS — W19XXXA Unspecified fall, initial encounter: Secondary | ICD-10-CM | POA: Diagnosis not present

## 2022-01-05 DIAGNOSIS — I951 Orthostatic hypotension: Secondary | ICD-10-CM | POA: Diagnosis not present

## 2022-01-05 DIAGNOSIS — R531 Weakness: Secondary | ICD-10-CM | POA: Diagnosis not present

## 2022-01-05 DIAGNOSIS — U071 COVID-19: Secondary | ICD-10-CM | POA: Diagnosis not present

## 2022-01-05 DIAGNOSIS — S0101XA Laceration without foreign body of scalp, initial encounter: Secondary | ICD-10-CM | POA: Diagnosis not present

## 2022-01-05 DIAGNOSIS — W19XXXA Unspecified fall, initial encounter: Secondary | ICD-10-CM | POA: Diagnosis not present

## 2022-01-11 DIAGNOSIS — W19XXXA Unspecified fall, initial encounter: Secondary | ICD-10-CM | POA: Diagnosis not present

## 2022-01-11 DIAGNOSIS — S0101XA Laceration without foreign body of scalp, initial encounter: Secondary | ICD-10-CM | POA: Diagnosis not present

## 2022-01-11 DIAGNOSIS — U071 COVID-19: Secondary | ICD-10-CM | POA: Diagnosis not present

## 2022-01-11 DIAGNOSIS — R531 Weakness: Secondary | ICD-10-CM | POA: Diagnosis not present

## 2022-01-11 DIAGNOSIS — I951 Orthostatic hypotension: Secondary | ICD-10-CM | POA: Diagnosis not present

## 2022-01-16 DIAGNOSIS — I951 Orthostatic hypotension: Secondary | ICD-10-CM | POA: Diagnosis not present

## 2022-01-16 DIAGNOSIS — U071 COVID-19: Secondary | ICD-10-CM | POA: Diagnosis not present

## 2022-01-16 DIAGNOSIS — S0101XA Laceration without foreign body of scalp, initial encounter: Secondary | ICD-10-CM | POA: Diagnosis not present

## 2022-01-16 DIAGNOSIS — R531 Weakness: Secondary | ICD-10-CM | POA: Diagnosis not present

## 2022-01-16 DIAGNOSIS — W19XXXA Unspecified fall, initial encounter: Secondary | ICD-10-CM | POA: Diagnosis not present

## 2022-01-19 ENCOUNTER — Ambulatory Visit (INDEPENDENT_AMBULATORY_CARE_PROVIDER_SITE_OTHER): Payer: Medicare Other

## 2022-01-19 ENCOUNTER — Other Ambulatory Visit: Payer: Self-pay

## 2022-01-19 DIAGNOSIS — W19XXXA Unspecified fall, initial encounter: Secondary | ICD-10-CM | POA: Diagnosis not present

## 2022-01-19 DIAGNOSIS — I951 Orthostatic hypotension: Secondary | ICD-10-CM | POA: Diagnosis not present

## 2022-01-19 DIAGNOSIS — Z8616 Personal history of COVID-19: Secondary | ICD-10-CM | POA: Diagnosis not present

## 2022-01-19 DIAGNOSIS — S0101XA Laceration without foreign body of scalp, initial encounter: Secondary | ICD-10-CM | POA: Diagnosis not present

## 2022-01-19 DIAGNOSIS — R531 Weakness: Secondary | ICD-10-CM | POA: Diagnosis not present

## 2022-01-23 DIAGNOSIS — W19XXXA Unspecified fall, initial encounter: Secondary | ICD-10-CM | POA: Diagnosis not present

## 2022-01-23 DIAGNOSIS — R531 Weakness: Secondary | ICD-10-CM | POA: Diagnosis not present

## 2022-01-23 DIAGNOSIS — S0101XA Laceration without foreign body of scalp, initial encounter: Secondary | ICD-10-CM | POA: Diagnosis not present

## 2022-01-23 DIAGNOSIS — I951 Orthostatic hypotension: Secondary | ICD-10-CM | POA: Diagnosis not present

## 2022-01-23 DIAGNOSIS — U071 COVID-19: Secondary | ICD-10-CM | POA: Diagnosis not present

## 2022-01-30 ENCOUNTER — Telehealth: Payer: Self-pay

## 2022-01-30 DIAGNOSIS — W19XXXA Unspecified fall, initial encounter: Secondary | ICD-10-CM | POA: Diagnosis not present

## 2022-01-30 DIAGNOSIS — S0101XA Laceration without foreign body of scalp, initial encounter: Secondary | ICD-10-CM | POA: Diagnosis not present

## 2022-01-30 DIAGNOSIS — I951 Orthostatic hypotension: Secondary | ICD-10-CM | POA: Diagnosis not present

## 2022-01-30 DIAGNOSIS — U071 COVID-19: Secondary | ICD-10-CM | POA: Diagnosis not present

## 2022-01-30 DIAGNOSIS — R531 Weakness: Secondary | ICD-10-CM | POA: Diagnosis not present

## 2022-01-30 NOTE — Telephone Encounter (Signed)
Patient called c/o rash all over with itching. Patient states that it started Saturday. Denies any new meds, lotions, soaps, foods, etc. Patient denies any other symptoms such as difficulty breathing or any swelling anywhere. Patient is taking benadryl. The office had no openings today but patient was given an appt tomorrow at 205 with the acute provider. Patient was offer morning appointments but states that she is not able to come before lunch time as she has to get a ride.  Advised patient to call 911 if she starts to develop trouble breathing or notice any swelling in the throat, mouth, or facial area. Patient expressed understanding.  ?

## 2022-01-31 ENCOUNTER — Ambulatory Visit: Payer: Medicare Other | Admitting: Family Medicine

## 2022-01-31 ENCOUNTER — Encounter: Payer: Self-pay | Admitting: Family Medicine

## 2022-01-31 VITALS — BP 148/80 | HR 84 | Temp 96.6°F | Resp 20 | Ht 60.0 in | Wt 93.0 lb

## 2022-01-31 DIAGNOSIS — L299 Pruritus, unspecified: Secondary | ICD-10-CM | POA: Diagnosis not present

## 2022-01-31 MED ORDER — LEVOCETIRIZINE DIHYDROCHLORIDE 5 MG PO TABS: 5.0000 mg | ORAL_TABLET | Freq: Every evening | ORAL | 0 refills | Status: DC | PRN

## 2022-01-31 MED ORDER — METHYLPREDNISOLONE ACETATE 40 MG/ML IJ SUSP
40.0000 mg | Freq: Once | INTRAMUSCULAR | Status: AC
  Administered 2022-01-31: 40 mg via INTRAMUSCULAR

## 2022-01-31 NOTE — Patient Instructions (Signed)
Pruritus (itching) ?Pruritus is an itchy feeling on the skin. One of the most common causes is dry skin, but many different things can cause itching. Most cases of itching do not require medical attention. Sometimes itchy skin can turn into a rash. ?Follow these instructions at home: ?Skin care ? ?Apply moisturizing lotion to your skin as needed. Lotion that contains petroleum jelly is best. ?Take medicines or apply medicated creams only as told by your health care provider. This may include: ?Corticosteroid cream. ?Anti-itch lotions. ?Oral antihistamines. ?Apply a cool, wet cloth (cool compress) to the affected areas. ?Take baths with one of the following: ?Epsom salts. You can get these at your local pharmacy or grocery store. Follow the instructions on the packaging. ?Baking soda. Pour a small amount into the bath as told by your health care provider. ?Colloidal oatmeal. You can get this at your local pharmacy or grocery store. Follow the instructions on the packaging. ?Apply baking soda paste to your skin. To make the paste, stir water into a small amount of baking soda until it reaches a paste-like consistency. ?Do not scratch your skin. ?Do not take hot showers or baths, which can make itching worse. A cool shower may help with itching as long as you apply moisturizing lotion after the shower. ?Do not use scented soaps, detergents, perfumes, and cosmetic products. Instead, use gentle, unscented versions of these items. ?General instructions ?Avoid wearing tight clothes. ?Keep a journal to help find out what is causing your itching. Write down: ?What you eat and drink. ?What cosmetic products you use. ?What soaps or detergents you use. ?What you wear, including jewelry. ?Use a humidifier. This keeps the air moist, which helps to prevent dry skin. ?Be aware of any changes in your itchiness. ?Contact a health care provider if: ?The itching does not go away after several days. ?You are unusually thirsty or  urinating more than normal. ?Your skin tingles or feels numb. ?Your skin or the white parts of your eyes turn yellow (jaundice). ?You feel weak. ?You have any of the following: ?Night sweats. ?Tiredness (fatigue). ?Weight loss. ?Abdominal pain. ?Summary ?Pruritus is an itchy feeling on the skin. One of the most common causes is dry skin, but many different conditions and factors can cause itching. ?Apply moisturizing lotion to your skin as needed. Lotion that contains petroleum jelly is best. ?Take medicines or apply medicated creams only as told by your health care provider. ?Do not take hot showers or baths. Do not use scented soaps, detergents, perfumes, or cosmetic products. ?This information is not intended to replace advice given to you by your health care provider. Make sure you discuss any questions you have with your health care provider. ?Document Revised: 11/13/2017 Document Reviewed: 11/13/2017 ?Elsevier Patient Education ? [place]. ? ?

## 2022-01-31 NOTE — Progress Notes (Signed)
? ?Subjective: ?XU:XYBF ?PCP: Dettinger, Fransisca Kaufmann, MD ?Kristi Sawyer is a 88 y.o. female presenting to clinic today for: ? ?1. Rash ?Onset Saturday.  She reports diffuse itching that started under her axilla.  It started spreading into her chest and upper extremities.  She has some involvement in the lower abdomen and upper thighs.  She has had some hives but they overall seem to be getting a little bit better.  Though she is still extremely itchy.  Denies any contact with any new detergents, soaps, lotions, foods.  No new medications or pets.  She has not worked out in the yard or had any other chemical exposures that she can think of.  She does not report any shortness of breath, throat swelling or oral itching.  She is 50 mg of diphenhydramine at onset which did help some.  She does not report any urinary retention.  She has not had any other oral antihistamines or treatments for this issue ? ? ?ROS: Per HPI ? ?Allergies  ?Allergen Reactions  ? Prevnar 13 [Pneumococcal 13-Val Conj Vacc] Hives  ?  Red/streaking arm  ? Penicillins Hives  ?  Has patient had a PCN reaction causing immediate rash, facial/tongue/throat swelling, SOB or lightheadedness with hypotension: No ?Has patient had a PCN reaction causing severe rash involving mucus membranes or skin necrosis: No ?Has patient had a PCN reaction that required hospitalization: No ?Has patient had a PCN reaction occurring within the last 10 years: No ?If all of the above answers are "NO", then may proceed with Cephalosporin use. ?  ? Tetanus Toxoids Other (See Comments)  ?  Area of injection was red and streaked.  ? Codeine Nausea Only  ? Sulfonamide Derivatives Nausea Only  ? ?Past Medical History:  ?Diagnosis Date  ? Acid reflux   ? Anemia   ? Anxiety   ? Cataracts, bilateral   ? Celiac disease/sprue   ? Chronic gastritis   ? Esophageal stricture   ? Euthyroid   ? Gastritis   ? Hiatal hernia   ? Hyperlipidemia   ? Hypermobility syndrome   ? 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 Neuropathy   ? Osteoporosis   ? Palpitations   ? Postmenopausal HRT (hormone replacement therapy)   ? Shingles   ? x 3  ? Thyroid tumor, benign   ? ? ?Current Outpatient Medications:  ?  acetaminophen (TYLENOL) 325 MG tablet, Take 2 tablets (650 mg total) by mouth every 6 (six) hours as needed for mild pain (or Fever >/= 101)., Disp: 12 tablet, Rfl: 0 ?  albuterol (VENTOLIN HFA) 108 (90 Base) MCG/ACT inhaler, Inhale 2 puffs into the lungs 2 (two) times daily., Disp: 18 g, Rfl: 0 ?  amLODipine (NORVASC) 5 MG tablet, Take 1 tablet (5 mg total) by mouth daily., Disp: 90 tablet, Rfl: 3 ?  ascorbic acid (VITAMIN C) 500 MG tablet, Take 1 tablet (500 mg total) by mouth daily., Disp: 30 tablet, Rfl: 2 ?  Calcium Carbonate-Vit D-Min (CALTRATE PLUS PO), Take 1 tablet by mouth daily., Disp: , Rfl:  ?  Cholecalciferol (VITAMIN D) 2000 UNITS CAPS, Take by mouth as directed. Take 1 tablet daily except none on saturdays and sundays, Disp: , Rfl:  ?  denosumab (PROLIA) 60 MG/ML SOSY injection, Inject 60 mg into the skin every 6 (six) months., Disp: , Rfl:  ?  Fe Fum-FePoly-Vit C-Vit B3 (INTEGRA) 62.5-62.5-40-3 MG CAPS, TAKE 1 CAPSULE BY MOUTH EVERY DAY, Disp:  30 capsule, Rfl: 1 ?  Inulin 1.5 g CHEW, Chew 5 tablets by mouth daily. FIBER CHOICE, Disp: , Rfl:  ?  metoCLOPramide (REGLAN) 10 MG tablet, TAKE 1 TABLET BY MOUTH EVERYDAY AT BEDTIME (Patient taking differently: Take 10 mg by mouth at bedtime.), Disp: 90 tablet, Rfl: 0 ?  mirabegron ER (MYRBETRIQ) 50 MG TB24 tablet, Take 1 tablet (50 mg total) by mouth daily., Disp: 90 tablet, Rfl: 3 ?  Multiple Vitamins-Minerals (CENTRUM SILVER PO), Take 1 tablet by mouth daily., Disp: , Rfl:  ?  Omega-3 Fatty Acids (FISH OIL) 1000 MG CAPS, Take 1 capsule by mouth 3 (three) times daily. , Disp: , Rfl:  ?  omeprazole (PRILOSEC) 20 MG capsule, Take 1-2 capsules (20-40 mg total) by mouth daily., Disp: 180 capsule, Rfl: 3 ?  rosuvastatin (CRESTOR) 5 MG tablet,  TAKE 1 TABLET BY MOUTH ON MONDAY, WEDNESDAY, AND FRIDAY (Patient taking differently: Take 5 mg by mouth daily. Monday,Wednesday and Friday), Disp: 36 tablet, Rfl: 1 ?  zinc sulfate 220 (50 Zn) MG capsule, Take 1 capsule (220 mg total) by mouth daily., Disp: 30 capsule, Rfl: 1 ?Social History  ? ?Socioeconomic History  ? Marital status: Widowed  ?  Spouse name: Not on file  ? Number of children: 0  ? Years of education: 71  ? Highest education level: Bachelor's degree (e.g., BA, AB, BS)  ?Occupational History  ? Occupation: retired  ?  Employer: RETIRED  ?Tobacco Use  ? Smoking status: Never  ? Smokeless tobacco: Never  ?Vaping Use  ? Vaping Use: Never used  ?Substance and Sexual Activity  ? Alcohol use: No  ? Drug use: No  ? Sexual activity: Not Currently  ?Other Topics Concern  ? Not on file  ?Social History Narrative  ? 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 Close with her large church family  ? Still driving and very independent  ? ?Social Determinants of Health  ? ?Financial Resource Strain: Low Risk   ? Difficulty of Paying Living Expenses: Not very hard  ?Food Insecurity: No Food Insecurity  ? Worried About Charity fundraiser in the Last Year: Never true  ? Ran Out of Food in the Last Year: Never true  ?Transportation Needs: No Transportation Needs  ? Lack of Transportation (Medical): No  ? Lack of Transportation (Non-Medical): No  ?Physical Activity: Insufficiently Active  ? Days of Exercise per Week: 5 days  ? Minutes of Exercise per Session: 20 min  ?Stress: No Stress Concern Present  ? Feeling of Stress : Only a little  ?Social Connections: Moderately Integrated  ? Frequency of Communication with Friends and Family: More than three times a week  ? Frequency of Social Gatherings with Friends and Family: More than three times a week  ? Attends Religious Services: More than 4 times per year  ? Active Member of Clubs or Organizations: Yes  ?  Attends Archivist Meetings: More than 4 times per year  ? Marital Status: Widowed  ?Intimate Partner Violence: Not At Risk  ? Fear of Current or Ex-Partner: No  ? Emotionally Abused: No  ? Physically Abused: No  ? Sexually Abused: No  ? ?Family History  ?Problem Relation Age of Onset  ? Healthy Mother   ? Lung cancer Father   ? Colon cancer Neg Hx   ? Stomach cancer Neg Hx   ? Esophageal cancer Neg  Hx   ? ? ?Objective: ?Office vital signs reviewed. ?BP (!) 148/80   Pulse 84   Temp (!) 96.6 ?F (35.9 ?C)   Resp 20   Ht 5' (1.524 m)   Wt 93 lb (42.2 kg)   SpO2 95%   BMI 18.16 kg/m?  ? ?Physical Examination:  ?General: Awake, alert, thin elderly female, No acute distress ?Skin: Multiple areas of hyperpigmentation along the upper extremities.  Senile purpura noted.  She has a few areas of mild erythema and excoriation.  The axilla are fairly unremarkable except for some small areas of excoriation. ? ?Assessment/ Plan: ?88 y.o. female  ? ?Pruritus - Plan: methylPREDNISolone acetate (DEPO-MEDROL) injection 40 mg, levocetirizine (XYZAL) 5 MG tablet ? ?Uncertain etiology of pruritus.  Sounds like some type of allergic reaction but she certainly is not having any systemic involvement.  Physical exam was fairly unremarkable except for areas of excoriation.  I have prescribed her Xyzal and advised against ongoing use of Benadryl given risk in the elderly.  She will get a Depo-Medrol intramuscularly today to also help with inflammatory response.  I reviewed labs from February which showed a normal bilirubin count and normal liver enzymes.  Kidney function normal. CBC unremarkable. If she develops any worsening symptoms or signs or symptoms are not improving she is to follow-up ? ?No orders of the defined types were placed in this encounter. ? ?No orders of the defined types were placed in this encounter. ? ? ? ?Kristi Norlander, DO ?[place] ?[phone] ? ? ?

## 2022-02-01 ENCOUNTER — Other Ambulatory Visit: Payer: Self-pay | Admitting: Internal Medicine

## 2022-02-06 DIAGNOSIS — R531 Weakness: Secondary | ICD-10-CM | POA: Diagnosis not present

## 2022-02-06 DIAGNOSIS — U071 COVID-19: Secondary | ICD-10-CM | POA: Diagnosis not present

## 2022-02-06 DIAGNOSIS — I951 Orthostatic hypotension: Secondary | ICD-10-CM | POA: Diagnosis not present

## 2022-02-06 DIAGNOSIS — S0101XA Laceration without foreign body of scalp, initial encounter: Secondary | ICD-10-CM | POA: Diagnosis not present

## 2022-02-06 DIAGNOSIS — W19XXXA Unspecified fall, initial encounter: Secondary | ICD-10-CM | POA: Diagnosis not present

## 2022-02-12 ENCOUNTER — Other Ambulatory Visit: Payer: Self-pay | Admitting: Internal Medicine

## 2022-02-13 ENCOUNTER — Telehealth: Payer: Self-pay | Admitting: Family Medicine

## 2022-02-13 ENCOUNTER — Telehealth: Payer: Self-pay | Admitting: Internal Medicine

## 2022-02-13 MED ORDER — METOCLOPRAMIDE HCL 10 MG PO TABS: 10.0000 mg | ORAL_TABLET | Freq: Every day | ORAL | 0 refills | Status: DC

## 2022-02-13 NOTE — Telephone Encounter (Signed)
Generic reglan refilled, Celestine made an appointment. ?

## 2022-02-13 NOTE — Telephone Encounter (Signed)
Inbound call from patient requesting a medication refill for Metoclopramide [facility]m [facility] [place] Patient have an appt 4/20 with APP ?

## 2022-02-13 NOTE — Telephone Encounter (Signed)
Called 3 times busy each time patient needs second shingles can get at next appt or schedule with triage. ?

## 2022-02-18 DIAGNOSIS — R531 Weakness: Secondary | ICD-10-CM | POA: Diagnosis not present

## 2022-02-18 DIAGNOSIS — W19XXXA Unspecified fall, initial encounter: Secondary | ICD-10-CM | POA: Diagnosis not present

## 2022-02-18 DIAGNOSIS — I951 Orthostatic hypotension: Secondary | ICD-10-CM | POA: Diagnosis not present

## 2022-02-18 DIAGNOSIS — U071 COVID-19: Secondary | ICD-10-CM | POA: Diagnosis not present

## 2022-02-18 DIAGNOSIS — S0101XA Laceration without foreign body of scalp, initial encounter: Secondary | ICD-10-CM | POA: Diagnosis not present

## 2022-02-20 ENCOUNTER — Telehealth (INDEPENDENT_AMBULATORY_CARE_PROVIDER_SITE_OTHER): Payer: Medicare Other | Admitting: Family Medicine

## 2022-02-20 MED ORDER — ZINC SULFATE 220 (50 ZN) MG PO CAPS: 220.0000 mg | ORAL_CAPSULE | Freq: Every day | ORAL | 1 refills | Status: DC

## 2022-02-20 NOTE — Telephone Encounter (Signed)
Zinc prescription sent to pharmacy ?

## 2022-02-20 NOTE — Telephone Encounter (Signed)
?  Prescription Request ? ?02/20/2022 ? ?Is this a "Controlled Substance" medicine? no ? ?Have you seen your PCP in the last 2 weeks? no ? ?If YES, route message to pool  -  If NO, patient needs to be scheduled for appointment. ? ?What is the name of the medication or equipment? Zinc Sulfate ? ?Have you contacted your pharmacy to request a refill? yes  ? ?Which pharmacy would you like this sent to? [facility] ? ? ?Patient notified that their request is being sent to the clinical staff for review and that they should receive a response within 2 business days.  ?  ?Dettinger's pt. ? ?Does she needs to keep taking Vitamin C pill? (She is not out of this med) ? ?Just need Zinc Sulfate RX. ? ?Please call pt. ? ?

## 2022-02-21 NOTE — Telephone Encounter (Signed)
No return call will close chart  ?

## 2022-03-01 NOTE — Progress Notes (Signed)
? ? ?03/02/2022 ?Kristi Sawyer ?[id number] ?[id number] ? ?Referring provider: Dettinger, Fransisca Kaufmann, MD ?Primary GI doctor: Dr. Carlean Purl ? ?ASSESSMENT AND PLAN:  ? ?Gastroesophageal reflux disease, unspecified whether esophagitis present ?-     metoCLOPramide (REGLAN) 10 MG tablet; Take 1 tablet (10 mg total) by mouth at bedtime. ?-     omeprazole (PRILOSEC) 20 MG capsule; Take 1-2 capsules (20-40 mg total) by mouth daily. ?she reports symptoms are currently well controlled, and denies breakthrough reflux, burning in chest, hoarseness or cough. ?Lifestyle changes discussed, avoid NSAIDS ?Continue current medications ? ?CELIAC SPRUE ?Continue gluten free ? ? ? ?History of Present Illness:  ?88 y.o. female  with a past medical history of celiac disease and reflux with history of esophageal stricture and others listed below, returns to clinic today for refill of her medications. ?Seen last by Dr. Carlean Purl 09/14/2020 for reflux ?Patient has been doing well on metoclopramide 10 mg at bedtime and pantoprazole 1 times daily. ?Patient has no signs of tardive dyskinesia, and understands what to monitor for.  ?Denies GERD, dysphagia, AB pain, melena.  ?Remains gluten free.  ?No diarrhea, constipation.  ? ?Current Medications:  ? ?Current Outpatient Medications (Endocrine & Metabolic):  ?  denosumab (PROLIA) 60 MG/ML SOSY injection, Inject 60 mg into the skin every 6 (six) months. ? ?Current Outpatient Medications (Cardiovascular):  ?  amLODipine (NORVASC) 5 MG tablet, Take 1 tablet (5 mg total) by mouth daily. ?  rosuvastatin (CRESTOR) 5 MG tablet, TAKE 1 TABLET BY MOUTH ON MONDAY, WEDNESDAY, AND FRIDAY (Patient taking differently: Take 5 mg by mouth daily. Monday,Wednesday and Friday) ? ?Current Outpatient Medications (Respiratory):  ?  albuterol (VENTOLIN HFA) 108 (90 Base) MCG/ACT inhaler, Inhale 2 puffs into the lungs 2 (two) times daily. ?  levocetirizine (XYZAL) 5 MG tablet, Take 1 tablet (5 mg total) by mouth at bedtime as  needed (itching/ allergic reaction). ? ?Current Outpatient Medications (Analgesics):  ?  acetaminophen (TYLENOL) 325 MG tablet, Take 2 tablets (650 mg total) by mouth every 6 (six) hours as needed for mild pain (or Fever >/= 101). ? ?Current Outpatient Medications (Hematological):  ?  Fe Fum-FePoly-Vit C-Vit B3 (INTEGRA) 62.5-62.5-40-3 MG CAPS, TAKE 1 CAPSULE BY MOUTH EVERY DAY ? ?Current Outpatient Medications (Other):  ?  ascorbic acid (VITAMIN C) 500 MG tablet, Take 1 tablet (500 mg total) by mouth daily. ?  Calcium Carbonate-Vit D-Min (CALTRATE PLUS PO), Take 1 tablet by mouth daily. ?  Cholecalciferol (VITAMIN D) 2000 UNITS CAPS, Take by mouth as directed. Take 1 tablet daily except none on saturdays and sundays ?  Inulin 1.5 g CHEW, Chew 5 tablets by mouth daily. FIBER CHOICE ?  mirabegron ER (MYRBETRIQ) 50 MG TB24 tablet, Take 1 tablet (50 mg total) by mouth daily. ?  Multiple Vitamins-Minerals (CENTRUM SILVER PO), Take 1 tablet by mouth daily. ?  Omega-3 Fatty Acids (FISH OIL) 1000 MG CAPS, Take 1 capsule by mouth 3 (three) times daily.  ?  zinc sulfate 220 (50 Zn) MG capsule, Take 1 capsule (220 mg total) by mouth daily. ?  metoCLOPramide (REGLAN) 10 MG tablet, Take 1 tablet (10 mg total) by mouth at bedtime. ?  omeprazole (PRILOSEC) 20 MG capsule, Take 1-2 capsules (20-40 mg total) by mouth daily. ? ?Surgical History:  ?She  has a past surgical history that includes Partial hysterectomy (1974); Myomectomy; Thyroidectomy (1975); Appendectomy (1935); Cataract extraction (Bilateral); Retinal laser procedure; Colonoscopy; Esophagogastroduodenoscopy; Sigmoidoscopy; Abdominal hysterectomy; and Eye surgery. ?Family History:  ?Her family  history includes Healthy in her mother; Lung cancer in her father. ?Social History:  ? reports that she has never smoked. She has never used smokeless tobacco. She reports that she does not drink alcohol and does not use drugs. ? ?Current Medications, Allergies, Past Medical  History, Past Surgical History, Family History and Social History were reviewed in [facility] record. ? ?Physical Exam: ?BP 90/64   Pulse (!) 57   Ht 5' (1.524 m)   Wt 94 lb (42.6 kg)   BMI 18.36 kg/m?  ?General:   Pleasant,  female appears younger than stated age.  ?Heart:  Regular rate and rhythm; no murmurs ?Pulm: Clear anteriorly; no wheezing ?Abdomen:  Soft, Non-distended AB, Active bowel sounds. No tenderness . , No organomegaly appreciated. ?Extremities:    edema. ?Neurologic:  Alert and  oriented x4;  No focal deficits.  ?Psych:  Cooperative. Normal mood and affect. ? ? ?Vladimir Crofts, PA-C ?03/02/22 ?

## 2022-03-02 ENCOUNTER — Encounter: Payer: Self-pay | Admitting: Physician Assistant

## 2022-03-02 ENCOUNTER — Ambulatory Visit: Payer: Medicare Other | Admitting: Physician Assistant

## 2022-03-02 VITALS — BP 90/64 | HR 57 | Ht 60.0 in | Wt 94.0 lb

## 2022-03-02 DIAGNOSIS — K900 Celiac disease: Secondary | ICD-10-CM | POA: Diagnosis not present

## 2022-03-02 DIAGNOSIS — K219 Gastro-esophageal reflux disease without esophagitis: Secondary | ICD-10-CM | POA: Diagnosis not present

## 2022-03-02 MED ORDER — METOCLOPRAMIDE HCL 10 MG PO TABS: 10.0000 mg | ORAL_TABLET | Freq: Every day | ORAL | 3 refills | Status: DC

## 2022-03-02 MED ORDER — OMEPRAZOLE 20 MG PO CPDR: 20.0000 mg | DELAYED_RELEASE_CAPSULE | Freq: Every day | ORAL | 3 refills | Status: DC

## 2022-03-02 NOTE — Patient Instructions (Signed)
Reglan or metoclopramide  ?Can be used for gastroparesis or slow stomach, nausea, vomiting, GERD.  ? ?Continue the medication as needed up to 1 times a day, on an empty stomach 30 minutes before eating. ?It may take a few weeks for your stomach condition to start to get better. However, do not take this medicine for longer than 12 weeks.  ?The longer you take this medicine, and the more you take it, the greater your chances are of developing serious side effects. ? ?Some people may get a severe muscle problem called tardive dyskinesia. This problem may lessen or go away after stopping this drug, but it may not go away. The risk is greater with diabetes and in older adults, especially older females. The risk is greater with longer use or higher doses, but it may also occur after short-term use with low doses. Call your doctor right away if you have trouble controlling body movements or problems with your tongue, face, mouth, or jaw like tongue sticking out, puffing cheeks, mouth puckering, or chewing.  ? ?  ?Avoid spicy and acidic foods ?Avoid fatty foods ?Limit your intake of coffee, tea, alcohol, and carbonated drinks ?Work to maintain a healthy weight ?Keep the head of the bed elevated at least 3 inches with blocks or a wedge pillow if you are having any nighttime symptoms ?Stay upright for 2 hours after eating ?Avoid meals and snacks three to four hours before bedtime ? ?

## 2022-03-09 DIAGNOSIS — X32XXXD Exposure to sunlight, subsequent encounter: Secondary | ICD-10-CM | POA: Diagnosis not present

## 2022-03-09 DIAGNOSIS — Z85828 Personal history of other malignant neoplasm of skin: Secondary | ICD-10-CM | POA: Diagnosis not present

## 2022-03-09 DIAGNOSIS — Z08 Encounter for follow-up examination after completed treatment for malignant neoplasm: Secondary | ICD-10-CM | POA: Diagnosis not present

## 2022-03-09 DIAGNOSIS — L570 Actinic keratosis: Secondary | ICD-10-CM | POA: Diagnosis not present

## 2022-03-16 ENCOUNTER — Other Ambulatory Visit: Payer: Self-pay | Admitting: Family Medicine

## 2022-03-16 ENCOUNTER — Telehealth: Payer: Self-pay | Admitting: Family Medicine

## 2022-03-16 DIAGNOSIS — R42 Dizziness and giddiness: Secondary | ICD-10-CM

## 2022-03-16 MED ORDER — ASCORBIC ACID 500 MG PO TABS: 500.0000 mg | ORAL_TABLET | Freq: Every day | ORAL | 1 refills | Status: DC

## 2022-03-16 NOTE — Progress Notes (Signed)
Pt has been made aware to take Vitamin C with her iron. Informed that Dr. Warrick Parisian has sent in refills to her pharmacy. Pt has no further concerns. ?

## 2022-03-16 NOTE — Telephone Encounter (Signed)
Pt called stating that when she was in the hospital in February, she was prescribed Vitamin C. Pt says she is out of the vitamin C and wants to know if Dr Dettinger wants her to keep taking it, and if so, can [facility] ?

## 2022-03-16 NOTE — Progress Notes (Signed)
Vitamin C for her is mainly to take along with her iron to help absorption.  As long as she is taking her iron then we want her to take the vitamin C with it. ?

## 2022-04-01 ENCOUNTER — Other Ambulatory Visit: Payer: Self-pay | Admitting: Internal Medicine

## 2022-04-17 ENCOUNTER — Encounter: Payer: Self-pay | Admitting: Family Medicine

## 2022-04-17 ENCOUNTER — Ambulatory Visit (INDEPENDENT_AMBULATORY_CARE_PROVIDER_SITE_OTHER): Payer: Medicare Other | Admitting: Family Medicine

## 2022-04-17 VITALS — BP 130/72 | HR 84 | Temp 97.0°F | Ht 60.0 in | Wt 94.0 lb

## 2022-04-17 DIAGNOSIS — K219 Gastro-esophageal reflux disease without esophagitis: Secondary | ICD-10-CM

## 2022-04-17 DIAGNOSIS — I10 Essential (primary) hypertension: Secondary | ICD-10-CM | POA: Diagnosis not present

## 2022-04-17 DIAGNOSIS — Z23 Encounter for immunization: Secondary | ICD-10-CM

## 2022-04-17 DIAGNOSIS — N3281 Overactive bladder: Secondary | ICD-10-CM

## 2022-04-17 DIAGNOSIS — E782 Mixed hyperlipidemia: Secondary | ICD-10-CM

## 2022-04-17 MED ORDER — ROSUVASTATIN CALCIUM 5 MG PO TABS: ORAL_TABLET | ORAL | 1 refills | Status: DC

## 2022-04-17 NOTE — Progress Notes (Signed)
BP 130/72   Pulse 84   Temp (!) 97 F (36.1 C)   Ht 5' (1.524 m)   Wt 94 lb (42.6 kg)   SpO2 94%   BMI 18.36 kg/m    Subjective:   Patient ID: Kristi Sawyer, female    DOB: Feb 14, 1926, 88 y.o.   MRN: [id number]  HPI: Kristi Sawyer is a 88 y.o. female presenting on 04/17/2022 for Medical Management of Chronic Issues and Hypertension   HPI Hyperlipidemia Patient is coming in for recheck of his hyperlipidemia. The patient is currently taking Crestor every other day. They deny any issues with myalgias or history of liver damage from it. They deny any focal numbness or weakness or chest pain.   GERD Patient is currently on omeprazole.  She denies any major symptoms or abdominal pain or belching or burping. She denies any blood in her stool or lightheadedness or dizziness.   Hypertension Patient is currently on amlodipine, and their blood pressure today is 130/72. Patient denies any lightheadedness or dizziness. Patient denies headaches, blurred vision, chest pains, shortness of breath, or weakness. Denies any side effects from medication and is content with current medication.   Overactive bladder Patient is coming in for recheck of overactive bladder and takes Myrbetriq currently.  Relevant past medical, surgical, family and social history reviewed and updated as indicated. Interim medical history since our last visit reviewed. Allergies and medications reviewed and updated.  Review of Systems  Constitutional:  Negative for chills and fever.  Eyes:  Negative for visual disturbance.  Respiratory:  Negative for chest tightness and shortness of breath.   Cardiovascular:  Negative for chest pain and leg swelling.  Genitourinary:  Negative for difficulty urinating and dysuria.  Musculoskeletal:  Negative for back pain and gait problem.  Skin:  Negative for rash.  Neurological:  Negative for dizziness, light-headedness and headaches.  Psychiatric/Behavioral:  Negative for agitation  and behavioral problems.   All other systems reviewed and are negative.  Per HPI unless specifically indicated above   Allergies as of 04/17/2022       Reactions   Prevnar 13 [pneumococcal 13-val Conj Vacc] Hives   Red/streaking arm   Penicillins Hives   Has patient had a PCN reaction causing immediate rash, facial/tongue/throat swelling, SOB or lightheadedness with hypotension: No Has patient had a PCN reaction causing severe rash involving mucus membranes or skin necrosis: No Has patient had a PCN reaction that required hospitalization: No Has patient had a PCN reaction occurring within the last 10 years: No If all of the above answers are "NO", then may proceed with Cephalosporin use.   Tetanus Toxoids Other (See Comments)   Area of injection was red and streaked.   Codeine Nausea Only   Sulfonamide Derivatives Nausea Only        Medication List        Accurate as of April 17, 2022  1:11 PM. If you have any questions, ask your nurse or doctor.          acetaminophen 325 MG tablet Commonly known as: TYLENOL Take 2 tablets (650 mg total) by mouth every 6 (six) hours as needed for mild pain (or Fever >/= 101).   albuterol 108 (90 Base) MCG/ACT inhaler Commonly known as: VENTOLIN HFA Inhale 2 puffs into the lungs 2 (two) times daily.   amLODipine 5 MG tablet Commonly known as: NORVASC Take 1 tablet (5 mg total) by mouth daily.   ascorbic acid 500 MG tablet  Commonly known as: VITAMIN C Take 1 tablet (500 mg total) by mouth daily.   CALTRATE PLUS PO Take 1 tablet by mouth daily.   CENTRUM SILVER PO Take 1 tablet by mouth daily.   denosumab 60 MG/ML Sosy injection Commonly known as: PROLIA Inject 60 mg into the skin every 6 (six) months.   Fish Oil 1000 MG Caps Take 1 capsule by mouth 3 (three) times daily.   Integra 62.5-62.5-40-3 MG Caps TAKE 1 CAPSULE BY MOUTH EVERY DAY   Inulin 1.5 g Chew Chew 5 tablets by mouth daily. FIBER CHOICE   levocetirizine 5  MG tablet Commonly known as: XYZAL Take 1 tablet (5 mg total) by mouth at bedtime as needed (itching/ allergic reaction).   metoCLOPramide 10 MG tablet Commonly known as: REGLAN Take 1 tablet (10 mg total) by mouth at bedtime.   mirabegron ER 50 MG Tb24 tablet Commonly known as: Myrbetriq Take 1 tablet (50 mg total) by mouth daily.   omeprazole 20 MG capsule Commonly known as: PRILOSEC Take 1-2 capsules (20-40 mg total) by mouth daily.   rosuvastatin 5 MG tablet Commonly known as: CRESTOR TAKE 1 TABLET BY MOUTH ON MONDAY, WEDNESDAY, AND FRIDAY Strength: 5 mg What changed: See the new instructions. Changed by: Worthy Rancher, MD   Vitamin D 50 MCG (2000 UT) Caps Take by mouth as directed. Take 1 tablet daily except none on saturdays and sundays   zinc sulfate 220 (50 Zn) MG capsule Take 1 capsule (220 mg total) by mouth daily.         Objective:   BP 130/72   Pulse 84   Temp (!) 97 F (36.1 C)   Ht 5' (1.524 m)   Wt 94 lb (42.6 kg)   SpO2 94%   BMI 18.36 kg/m   Wt Readings from Last 3 Encounters:  04/17/22 94 lb (42.6 kg)  03/02/22 94 lb (42.6 kg)  01/31/22 93 lb (42.2 kg)    Physical Exam Vitals and nursing note reviewed.  Constitutional:      General: She is not in acute distress.    Appearance: She is well-developed. She is not diaphoretic.  Eyes:     Conjunctiva/sclera: Conjunctivae normal.  Cardiovascular:     Rate and Rhythm: Normal rate and regular rhythm.     Heart sounds: Normal heart sounds. No murmur heard. Pulmonary:     Effort: Pulmonary effort is normal. No respiratory distress.     Breath sounds: Normal breath sounds. No wheezing.  Musculoskeletal:        General: No tenderness. Normal range of motion.  Skin:    General: Skin is warm and dry.     Findings: No rash.  Neurological:     Mental Status: She is alert and oriented to person, place, and time.     Coordination: Coordination normal.  Psychiatric:        Behavior:  Behavior normal.      Assessment & Plan:   Problem List Items Addressed This Visit       Cardiovascular and Mediastinum   Hypertension - Primary   Relevant Medications   rosuvastatin (CRESTOR) 5 MG tablet   Other Relevant Orders   CBC with Differential/Platelet   CMP14+EGFR   Lipid panel     Digestive   GERD (gastroesophageal reflux disease)   Relevant Orders   CBC with Differential/Platelet   CMP14+EGFR     Genitourinary   Overactive bladder     Other   Hyperlipidemia  Relevant Medications   rosuvastatin (CRESTOR) 5 MG tablet   Other Relevant Orders   Lipid panel   Other Visit Diagnoses     Need for shingles vaccine       Relevant Orders   Varicella-zoster vaccine IM (Shingrix) (Completed)       She is using a walker now which she is not happy about but it is keeping her from falling.  She plans to continue using it.  No change in medicine, continue current treatment. Follow up plan: Return in about 6 months (around 10/17/2022), or if symptoms worsen or fail to improve, for Hypertension and cholesterol and overactive bladder.  Counseling provided for all of the vaccine components Orders Placed This Encounter  Procedures   Varicella-zoster vaccine IM (Shingrix)   CBC with Differential/Platelet   CMP14+EGFR   Lipid panel    [name], MD [facility] 04/17/2022, 1:11 PM

## 2022-04-18 ENCOUNTER — Other Ambulatory Visit: Payer: Self-pay | Admitting: Nurse Practitioner

## 2022-04-18 LAB — CMP14+EGFR
ALT: 21 IU/L (ref 0–32)
AST: 18 IU/L (ref 0–40)
Albumin/Globulin Ratio: 2.2 (ref 1.2–2.2)
Albumin: 4.4 g/dL (ref 3.5–4.6)
Alkaline Phosphatase: 51 IU/L (ref 44–121)
BUN/Creatinine Ratio: 15 (ref 12–28)
BUN: 8 mg/dL — ABNORMAL LOW (ref 10–36)
Bilirubin Total: 0.3 mg/dL (ref 0.0–1.2)
CO2: 24 mmol/L (ref 20–29)
Calcium: 9.9 mg/dL (ref 8.7–10.3)
Chloride: 98 mmol/L (ref 96–106)
Creatinine, Ser: 0.55 mg/dL — ABNORMAL LOW (ref 0.57–1.00)
Globulin, Total: 2 g/dL (ref 1.5–4.5)
Glucose: 118 mg/dL — ABNORMAL HIGH (ref 70–99)
Potassium: 4.3 mmol/L (ref 3.5–5.2)
Sodium: 137 mmol/L (ref 134–144)
Total Protein: 6.4 g/dL (ref 6.0–8.5)
eGFR: 85 mL/min/{1.73_m2} (ref >59)

## 2022-04-18 LAB — CBC WITH DIFFERENTIAL/PLATELET
Basophils Absolute: 0 10*3/uL (ref 0.0–0.2)
Basos: 0 %
EOS (ABSOLUTE): 0 10*3/uL (ref 0.0–0.4)
Eos: 0 %
Hematocrit: 40.5 % (ref 34.0–46.6)
Hemoglobin: 13.6 g/dL (ref 11.1–15.9)
Immature Grans (Abs): 0 10*3/uL (ref 0.0–0.1)
Immature Granulocytes: 0 %
Lymphocytes Absolute: 2 10*3/uL (ref 0.7–3.1)
Lymphs: 23 %
MCH: 31.6 pg (ref 26.6–33.0)
MCHC: 33.6 g/dL (ref 31.5–35.7)
MCV: 94 fL (ref 79–97)
Monocytes Absolute: 0.9 10*3/uL (ref 0.1–0.9)
Monocytes: 11 %
Neutrophils Absolute: 5.7 10*3/uL (ref 1.4–7.0)
Neutrophils: 66 %
Platelets: 256 10*3/uL (ref 150–450)
RBC: 4.31 x10E6/uL (ref 3.77–5.28)
RDW: 11.4 % — ABNORMAL LOW (ref 11.7–15.4)
WBC: 8.7 10*3/uL (ref 3.4–10.8)

## 2022-04-18 LAB — LIPID PANEL
Chol/HDL Ratio: 2.5 ratio (ref 0.0–4.4)
Cholesterol, Total: 197 mg/dL (ref 100–199)
HDL: 78 mg/dL (ref >39)
LDL Chol Calc (NIH): 99 mg/dL (ref 0–99)
Triglycerides: 118 mg/dL (ref 0–149)
VLDL Cholesterol Cal: 20 mg/dL (ref 5–40)

## 2022-05-05 ENCOUNTER — Emergency Department (HOSPITAL_COMMUNITY)
Admission: EM | Admit: 2022-05-05 | Discharge: 2022-05-06 | Disposition: A | Discharge status: Home / Self Care | Payer: Medicare Other | Attending: Emergency Medicine | Admitting: Emergency Medicine

## 2022-05-05 ENCOUNTER — Encounter (HOSPITAL_COMMUNITY): Payer: Self-pay | Admitting: Emergency Medicine

## 2022-05-05 ENCOUNTER — Emergency Department (HOSPITAL_COMMUNITY): Payer: Medicare Other

## 2022-05-05 ENCOUNTER — Other Ambulatory Visit: Payer: Self-pay

## 2022-05-05 DIAGNOSIS — R531 Weakness: Secondary | ICD-10-CM | POA: Diagnosis not present

## 2022-05-05 DIAGNOSIS — Z743 Need for continuous supervision: Secondary | ICD-10-CM | POA: Diagnosis not present

## 2022-05-05 DIAGNOSIS — G319 Degenerative disease of nervous system, unspecified: Secondary | ICD-10-CM | POA: Diagnosis not present

## 2022-05-05 DIAGNOSIS — W1839XA Other fall on same level, initial encounter: Secondary | ICD-10-CM | POA: Diagnosis not present

## 2022-05-05 DIAGNOSIS — Z79899 Other long term (current) drug therapy: Secondary | ICD-10-CM | POA: Diagnosis not present

## 2022-05-05 DIAGNOSIS — S0990XA Unspecified injury of head, initial encounter: Secondary | ICD-10-CM | POA: Diagnosis not present

## 2022-05-05 DIAGNOSIS — G930 Cerebral cysts: Secondary | ICD-10-CM | POA: Diagnosis not present

## 2022-05-05 DIAGNOSIS — Z043 Encounter for examination and observation following other accident: Secondary | ICD-10-CM | POA: Diagnosis not present

## 2022-05-05 DIAGNOSIS — D72829 Elevated white blood cell count, unspecified: Secondary | ICD-10-CM | POA: Insufficient documentation

## 2022-05-05 DIAGNOSIS — I10 Essential (primary) hypertension: Secondary | ICD-10-CM | POA: Diagnosis not present

## 2022-05-05 DIAGNOSIS — W19XXXA Unspecified fall, initial encounter: Secondary | ICD-10-CM | POA: Diagnosis not present

## 2022-05-05 LAB — BASIC METABOLIC PANEL
Anion gap: 8 (ref 5–15)
BUN: 15 mg/dL (ref 8–23)
CO2: 27 mmol/L (ref 22–32)
Calcium: 9.6 mg/dL (ref 8.9–10.3)
Chloride: 99 mmol/L (ref 98–111)
Creatinine, Ser: 0.68 mg/dL (ref 0.44–1.00)
GFR, Estimated: >60 mL/min (ref >60)
Glucose, Bld: 132 mg/dL — ABNORMAL HIGH (ref 70–99)
Potassium: 4.1 mmol/L (ref 3.5–5.1)
Sodium: 134 mmol/L — ABNORMAL LOW (ref 135–145)

## 2022-05-05 LAB — URINALYSIS, ROUTINE W REFLEX MICROSCOPIC
Bacteria, UA: NONE SEEN
Bilirubin Urine: NEGATIVE
Glucose, UA: NEGATIVE mg/dL
Hgb urine dipstick: NEGATIVE
Ketones, ur: 5 mg/dL — AB
Leukocytes,Ua: NEGATIVE
Nitrite: NEGATIVE
Protein, ur: 30 mg/dL — AB
Specific Gravity, Urine: 1.012 (ref 1.005–1.030)
pH: 7 (ref 5.0–8.0)

## 2022-05-05 LAB — CK: Total CK: 188 U/L (ref 38–234)

## 2022-05-05 LAB — CBC
HCT: 40 % (ref 36.0–46.0)
Hemoglobin: 13.6 g/dL (ref 12.0–15.0)
MCH: 32.5 pg (ref 26.0–34.0)
MCHC: 34 g/dL (ref 30.0–36.0)
MCV: 95.7 fL (ref 80.0–100.0)
Platelets: 235 10*3/uL (ref 150–400)
RBC: 4.18 MIL/uL (ref 3.87–5.11)
RDW: 12.2 % (ref 11.5–15.5)
WBC: 19 10*3/uL — ABNORMAL HIGH (ref 4.0–10.5)
nRBC: 0 % (ref 0.0–0.2)

## 2022-05-05 MED ORDER — CEPHALEXIN 500 MG PO CAPS: 500.0000 mg | ORAL_CAPSULE | Freq: Two times a day (BID) | ORAL | 0 refills | Status: DC

## 2022-05-05 MED ORDER — CEPHALEXIN 500 MG PO CAPS
500.0000 mg | ORAL_CAPSULE | Freq: Once | ORAL | Status: AC
  Administered 2022-05-05: 500 mg via ORAL
  Filled 2022-05-05: qty 1

## 2022-05-05 NOTE — ED Notes (Signed)
Pt ambulated to and from the bathroom with assistance from this nurse tech. Pt did well. Nurse aware.

## 2022-05-06 DIAGNOSIS — E162 Hypoglycemia, unspecified: Secondary | ICD-10-CM | POA: Diagnosis not present

## 2022-05-06 DIAGNOSIS — T699XXA Effect of reduced temperature, unspecified, initial encounter: Secondary | ICD-10-CM | POA: Diagnosis not present

## 2022-05-06 DIAGNOSIS — R531 Weakness: Secondary | ICD-10-CM | POA: Diagnosis not present

## 2022-05-06 DIAGNOSIS — Z743 Need for continuous supervision: Secondary | ICD-10-CM | POA: Diagnosis not present

## 2022-05-06 DIAGNOSIS — E161 Other hypoglycemia: Secondary | ICD-10-CM | POA: Diagnosis not present

## 2022-05-09 ENCOUNTER — Encounter: Payer: Self-pay | Admitting: Family Medicine

## 2022-05-09 ENCOUNTER — Ambulatory Visit (INDEPENDENT_AMBULATORY_CARE_PROVIDER_SITE_OTHER): Payer: Medicare Other | Admitting: Family Medicine

## 2022-05-09 VITALS — BP 135/78 | HR 90 | Temp 98.1°F | Ht 60.0 in | Wt 95.4 lb

## 2022-05-09 DIAGNOSIS — D72829 Elevated white blood cell count, unspecified: Secondary | ICD-10-CM

## 2022-05-09 DIAGNOSIS — W19XXXA Unspecified fall, initial encounter: Secondary | ICD-10-CM | POA: Diagnosis not present

## 2022-05-10 ENCOUNTER — Ambulatory Visit: Payer: Medicare Other | Admitting: Family Medicine

## 2022-05-10 LAB — CBC WITH DIFFERENTIAL/PLATELET
Basophils Absolute: 0 10*3/uL (ref 0.0–0.2)
Basos: 0 %
EOS (ABSOLUTE): 0 10*3/uL (ref 0.0–0.4)
Eos: 0 %
Hematocrit: 40.4 % (ref 34.0–46.6)
Hemoglobin: 13.9 g/dL (ref 11.1–15.9)
Immature Grans (Abs): 0 10*3/uL (ref 0.0–0.1)
Immature Granulocytes: 0 %
Lymphocytes Absolute: 2.8 10*3/uL (ref 0.7–3.1)
Lymphs: 26 %
MCH: 32.3 pg (ref 26.6–33.0)
MCHC: 34.4 g/dL (ref 31.5–35.7)
MCV: 94 fL (ref 79–97)
Monocytes Absolute: 1 10*3/uL — ABNORMAL HIGH (ref 0.1–0.9)
Monocytes: 10 %
Neutrophils Absolute: 6.8 10*3/uL (ref 1.4–7.0)
Neutrophils: 64 %
Platelets: 240 10*3/uL (ref 150–450)
RBC: 4.3 x10E6/uL (ref 3.77–5.28)
RDW: 11.2 % — ABNORMAL LOW (ref 11.7–15.4)
WBC: 10.7 10*3/uL (ref 3.4–10.8)

## 2022-05-10 NOTE — Progress Notes (Signed)
Hello Virgilene,  Your lab result is normal and/or stable.Some minor variations that are not significant are commonly marked abnormal, but do not represent any medical problem for you.  Best regards, Claretta Fraise, M.D.

## 2022-05-23 DIAGNOSIS — Z1231 Encounter for screening mammogram for malignant neoplasm of breast: Secondary | ICD-10-CM | POA: Diagnosis not present

## 2022-05-24 ENCOUNTER — Ambulatory Visit: Payer: Self-pay | Admitting: *Deleted

## 2022-05-24 NOTE — Chronic Care Management (AMB) (Signed)
  Chronic Care Management   Note  05/24/2022 Name: Kristi Sawyer MRN: [id number] DOB: Jun 05, 1926   Patient is stable from [facility] Management perspective or has not recently engaged with the [facility]. I am removing RN Care Manager from Care Team and closing [facility]. If patient is currently engaged with another CCM team member I will forward this encounter to inform them of my case closure. Patient may be eligible for re-engagement with RN Care Manager in the future if necessary and can discuss this with their PCP.  Chong Sicilian, BSN, RN-BC Embedded Chronic Care Manager [facility] / [facility] Direct Dial: [phone]

## 2022-05-31 ENCOUNTER — Other Ambulatory Visit: Payer: Self-pay | Admitting: Internal Medicine

## 2022-06-13 ENCOUNTER — Other Ambulatory Visit: Payer: Self-pay | Admitting: Family Medicine

## 2022-06-13 DIAGNOSIS — L299 Pruritus, unspecified: Secondary | ICD-10-CM

## 2022-06-13 DIAGNOSIS — E782 Mixed hyperlipidemia: Secondary | ICD-10-CM

## 2022-06-15 ENCOUNTER — Other Ambulatory Visit: Payer: Self-pay | Admitting: Family Medicine

## 2022-07-18 ENCOUNTER — Emergency Department (HOSPITAL_COMMUNITY): Payer: Medicare Other

## 2022-07-18 ENCOUNTER — Emergency Department (HOSPITAL_COMMUNITY)
Admission: EM | Admit: 2022-07-18 | Discharge: 2022-07-19 | Disposition: A | Discharge status: Home / Self Care | Payer: Medicare Other | Attending: Emergency Medicine | Admitting: Emergency Medicine

## 2022-07-18 ENCOUNTER — Encounter (HOSPITAL_COMMUNITY): Payer: Self-pay | Admitting: Emergency Medicine

## 2022-07-18 ENCOUNTER — Other Ambulatory Visit: Payer: Self-pay

## 2022-07-18 DIAGNOSIS — W08XXXA Fall from other furniture, initial encounter: Secondary | ICD-10-CM | POA: Insufficient documentation

## 2022-07-18 DIAGNOSIS — R531 Weakness: Secondary | ICD-10-CM | POA: Insufficient documentation

## 2022-07-18 DIAGNOSIS — R9431 Abnormal electrocardiogram [ECG] [EKG]: Secondary | ICD-10-CM | POA: Diagnosis not present

## 2022-07-18 DIAGNOSIS — S0990XA Unspecified injury of head, initial encounter: Secondary | ICD-10-CM | POA: Diagnosis not present

## 2022-07-18 DIAGNOSIS — W19XXXA Unspecified fall, initial encounter: Secondary | ICD-10-CM | POA: Diagnosis not present

## 2022-07-18 DIAGNOSIS — I6529 Occlusion and stenosis of unspecified carotid artery: Secondary | ICD-10-CM | POA: Diagnosis not present

## 2022-07-18 DIAGNOSIS — Z743 Need for continuous supervision: Secondary | ICD-10-CM | POA: Diagnosis not present

## 2022-07-18 DIAGNOSIS — R6889 Other general symptoms and signs: Secondary | ICD-10-CM | POA: Diagnosis not present

## 2022-07-18 DIAGNOSIS — G319 Degenerative disease of nervous system, unspecified: Secondary | ICD-10-CM | POA: Diagnosis not present

## 2022-07-18 LAB — URINALYSIS, ROUTINE W REFLEX MICROSCOPIC
Bilirubin Urine: NEGATIVE
Glucose, UA: NEGATIVE mg/dL
Hgb urine dipstick: NEGATIVE
Ketones, ur: NEGATIVE mg/dL
Leukocytes,Ua: NEGATIVE
Nitrite: NEGATIVE
Protein, ur: NEGATIVE mg/dL
Specific Gravity, Urine: 1.005 (ref 1.005–1.030)
pH: 7 (ref 5.0–8.0)

## 2022-07-18 LAB — BASIC METABOLIC PANEL
Anion gap: 9 (ref 5–15)
BUN: 12 mg/dL (ref 8–23)
CO2: 26 mmol/L (ref 22–32)
Calcium: 9.5 mg/dL (ref 8.9–10.3)
Chloride: 96 mmol/L — ABNORMAL LOW (ref 98–111)
Creatinine, Ser: 0.58 mg/dL (ref 0.44–1.00)
GFR, Estimated: >60 mL/min (ref >60)
Glucose, Bld: 130 mg/dL — ABNORMAL HIGH (ref 70–99)
Potassium: 4.1 mmol/L (ref 3.5–5.1)
Sodium: 131 mmol/L — ABNORMAL LOW (ref 135–145)

## 2022-07-18 LAB — CBC
HCT: 40.1 % (ref 36.0–46.0)
Hemoglobin: 13.7 g/dL (ref 12.0–15.0)
MCH: 32.6 pg (ref 26.0–34.0)
MCHC: 34.2 g/dL (ref 30.0–36.0)
MCV: 95.5 fL (ref 80.0–100.0)
Platelets: 251 10*3/uL (ref 150–400)
RBC: 4.2 MIL/uL (ref 3.87–5.11)
RDW: 13.3 % (ref 11.5–15.5)
WBC: 8 10*3/uL (ref 4.0–10.5)
nRBC: 0 % (ref 0.0–0.2)

## 2022-07-18 NOTE — ED Provider Notes (Incomplete)
  [facility] EMERGENCY DEPARTMENT Provider Note   CSN: [id number] Arrival date & time: 07/18/22  2007     History Chief Complaint  Patient presents with  . Fall    HPI Kristi Sawyer is a 88 y.o. female presenting for near fall event.   Patient's recorded medical, surgical, social, medication list and allergies were reviewed in the Snapshot window as part of the initial history.   Review of Systems   Review of Systems  Physical Exam Updated Vital Signs BP (!) 155/84   Pulse 92   Temp 99.1 F (37.3 C) (Rectal)   Resp 17   Ht 5' (1.524 m)   Wt 43.3 kg   SpO2 97%   BMI 18.64 kg/m  Physical Exam   ED Course/ Medical Decision Making/ A&P Clinical Course as of 07/18/22 2117  Tue Jul 18, 2022  2043 Need to see  [CC]    Clinical Course User Index [CC] Tretha Sciara, MD    Procedures Procedures   Medications Ordered in ED Medications - No data to display  Medical Decision Making:    Kristi Sawyer is a 88 y.o. female who presented to the ED today with *** detailed above.     {crccomplexity:27900}  Complete initial physical exam performed, notably the patient  was ***.      Reviewed and confirmed nursing documentation for past medical history, family history, social history.    Initial Assessment:   With the patient's presentation of ***, most likely diagnosis is ***. Other diagnoses were considered including (but not limited to) ***. These are considered less likely due to history of present illness and physical exam findings.   {crccopa:27899}  Initial Plan:  ***  ***Screening labs including CBC and Metabolic panel to evaluate for infectious or metabolic etiology of disease.  ***Urinalysis with reflex culture ordered to evaluate for UTI or relevant urologic/nephrologic pathology.  ***CXR to evaluate for structural/infectious intrathoracic pathology.  ***EKG to evaluate for cardiac pathology. Objective evaluation as below reviewed with plan for close  reassessment  Initial Study Results:   Laboratory  All laboratory results reviewed without evidence of clinically relevant pathology.   ***Exceptions include: ***   ***EKG EKG was reviewed independently. Rate, rhythm, axis, intervals all examined and without medically relevant abnormality. ST segments without concerns for elevations.    Radiology  All images reviewed independently. ***Agree with radiology report at this time.   No results found.   Consults:  Case discussed with ***.   Final Assessment and Plan:   ***   ***  Clinical Impression: No diagnosis found.   Data Unavailable   Final Clinical Impression(s) / ED Diagnoses Final diagnoses:  None    Rx / DC Orders ED Discharge Orders     None

## 2022-07-18 NOTE — ED Provider Notes (Signed)
  [facility] EMERGENCY DEPARTMENT Provider Note   CSN: [id number] Arrival date & time: 07/18/22  2007     History Chief Complaint  Patient presents with  . Fall    HPI Kristi Sawyer is a 88 y.o. female presenting for ***.   Patient's recorded medical, surgical, social, medication list and allergies were reviewed in the Snapshot window as part of the initial history.   Review of Systems   Review of Systems  Physical Exam Updated Vital Signs BP (!) 155/84   Pulse 92   Temp 99.1 F (37.3 C) (Rectal)   Resp 17   Ht 5' (1.524 m)   Wt 43.3 kg   SpO2 97%   BMI 18.64 kg/m  Physical Exam   ED Course/ Medical Decision Making/ A&P Clinical Course as of 07/18/22 2117  Tue Jul 18, 2022  2043 Need to see  [CC]    Clinical Course User Index [CC] Tretha Sciara, MD    Procedures Procedures   Medications Ordered in ED Medications - No data to display  Medical Decision Making:    Kristi Sawyer is a 88 y.o. female who presented to the ED today with *** detailed above.     {crccomplexity:27900}  Complete initial physical exam performed, notably the patient  was ***.      Reviewed and confirmed nursing documentation for past medical history, family history, social history.    Initial Assessment:   With the patient's presentation of ***, most likely diagnosis is ***. Other diagnoses were considered including (but not limited to) ***. These are considered less likely due to history of present illness and physical exam findings.   {crccopa:27899}  Initial Plan:  ***  ***Screening labs including CBC and Metabolic panel to evaluate for infectious or metabolic etiology of disease.  ***Urinalysis with reflex culture ordered to evaluate for UTI or relevant urologic/nephrologic pathology.  ***CXR to evaluate for structural/infectious intrathoracic pathology.  ***EKG to evaluate for cardiac pathology. Objective evaluation as below reviewed with plan for close  reassessment  Initial Study Results:   Laboratory  All laboratory results reviewed without evidence of clinically relevant pathology.   ***Exceptions include: ***   ***EKG EKG was reviewed independently. Rate, rhythm, axis, intervals all examined and without medically relevant abnormality. ST segments without concerns for elevations.    Radiology  All images reviewed independently. ***Agree with radiology report at this time.   No results found.   Consults:  Case discussed with ***.   Final Assessment and Plan:   ***   ***  Clinical Impression: No diagnosis found.   Data Unavailable   Final Clinical Impression(s) / ED Diagnoses Final diagnoses:  None    Rx / DC Orders ED Discharge Orders     None

## 2022-07-18 NOTE — ED Triage Notes (Signed)
Ems called out for fall. Per ems pt fell/stumbled onto couch. Pt states she has felt weak all day.

## 2022-07-19 NOTE — ED Notes (Signed)
Pt ambulated down the hall and back. With standby assist Pt walks with erect posture, no leaning forward or backward Pt O2- 95-98% Pt HR 113-124 Pt states she feels wobble and . States does not not feel safe even with her walker

## 2022-07-19 NOTE — ED Notes (Signed)
Pt friend in chart called. Friend states that she does not drive at night and that she will not be able to pick pt up until the morning. Friend asked in convo was able to take pt home and this RN told friend that pt does not meet criteria for convo transportation. Friend states that if pt is still in hospital in the morning then she will pick her up.

## 2022-07-20 ENCOUNTER — Encounter: Payer: Self-pay | Admitting: Family Medicine

## 2022-07-20 ENCOUNTER — Ambulatory Visit (INDEPENDENT_AMBULATORY_CARE_PROVIDER_SITE_OTHER): Payer: Medicare Other | Admitting: Family Medicine

## 2022-07-20 VITALS — BP 125/77 | HR 82 | Temp 97.8°F | Ht 60.0 in | Wt 95.0 lb

## 2022-07-20 DIAGNOSIS — W19XXXD Unspecified fall, subsequent encounter: Secondary | ICD-10-CM | POA: Diagnosis not present

## 2022-07-20 DIAGNOSIS — I10 Essential (primary) hypertension: Secondary | ICD-10-CM

## 2022-07-20 NOTE — Progress Notes (Signed)
   Established Patient Office Visit  Subjective   Patient ID: KHARMA SAMPSEL, female    DOB: 1926-07-02  Age: 88 y.o. MRN: [id number]  Chief Complaint  Patient presents with   Hospitalization Follow-up    HPI Jacquelyne is here for an ER follow up. She was seen in the ER on 07/18/22 at [facility] following a near fall. She was using her walker when starting sliding. She was close to the cough and she was able to sit down on the cough. She did not have any injuries. She did not have a head injury. She does wear a life alert when at home. She uses a walker at baseline. Her caregiver recommended that she be seen in the ER. While there she had labs and a CT head done. CT was negative for acute findings and labs were unremarkable. She was observed for 12 hours and released. She reports that she feels fine since. She denies any other falls or near falls. Her blood pressure was elevated in the ER.      Review of Systems  Constitutional:  Negative for chills, fever and malaise/fatigue.  Respiratory:  Negative for shortness of breath.   Cardiovascular:  Negative for chest pain, palpitations and leg swelling.  Neurological:  Negative for sensory change, speech change, focal weakness, seizures and loss of consciousness.      Objective:     BP 125/77   Pulse 82   Temp 97.8 F (36.6 C)   Ht 5' (1.524 m)   Wt 95 lb (43.1 kg)   SpO2 95%   BMI 18.55 kg/m    Physical Exam Vitals and nursing note reviewed.  Constitutional:      General: She is not in acute distress.    Appearance: She is not ill-appearing, toxic-appearing or diaphoretic.  Cardiovascular:     Rate and Rhythm: Normal rate and regular rhythm.     Heart sounds: Normal heart sounds. No murmur heard. Pulmonary:     Effort: Pulmonary effort is normal. No respiratory distress.     Breath sounds: Normal breath sounds.  Musculoskeletal:     Right lower leg: No edema.     Left lower leg: No edema.  Skin:    General: Skin is warm and dry.   Neurological:     General: No focal deficit present.     Mental Status: She is alert and oriented to person, place, and time.     Gait: Gait abnormal (rolling walker).  Psychiatric:        Mood and Affect: Mood normal.        Behavior: Behavior normal.      No results found for any visits on 07/20/22.    The ASCVD Risk score (Arnett DK, et al., 2019) failed to calculate for the following reasons:   The 2019 ASCVD risk score is only valid for ages 38 to 19    Assessment & Plan:   Dotti was seen today for hospitalization follow-up.  Diagnoses and all orders for this visit:  Fall, subsequent encounter Near fall. Reviewed ER records and imaging report. Discussed fall prevention.   Primary hypertension Well controlled on current regimen.   Follow up with PCP as scheduled, sooner if needed.   The patient indicates understanding of these issues and agrees with the plan.   Gwenlyn Perking, FNP

## 2022-07-28 ENCOUNTER — Emergency Department (HOSPITAL_COMMUNITY)
Admission: EM | Admit: 2022-07-28 | Discharge: 2022-07-29 | Disposition: A | Discharge status: Home / Self Care | Payer: Medicare Other | Attending: Emergency Medicine | Admitting: Emergency Medicine

## 2022-07-28 ENCOUNTER — Emergency Department (HOSPITAL_COMMUNITY): Payer: Medicare Other

## 2022-07-28 ENCOUNTER — Encounter (HOSPITAL_COMMUNITY): Payer: Self-pay | Admitting: *Deleted

## 2022-07-28 ENCOUNTER — Other Ambulatory Visit: Payer: Self-pay

## 2022-07-28 DIAGNOSIS — R269 Unspecified abnormalities of gait and mobility: Secondary | ICD-10-CM | POA: Diagnosis not present

## 2022-07-28 DIAGNOSIS — D170 Benign lipomatous neoplasm of skin and subcutaneous tissue of head, face and neck: Secondary | ICD-10-CM | POA: Diagnosis not present

## 2022-07-28 DIAGNOSIS — R262 Difficulty in walking, not elsewhere classified: Secondary | ICD-10-CM | POA: Insufficient documentation

## 2022-07-28 DIAGNOSIS — R29818 Other symptoms and signs involving the nervous system: Secondary | ICD-10-CM | POA: Diagnosis not present

## 2022-07-28 DIAGNOSIS — I739 Peripheral vascular disease, unspecified: Secondary | ICD-10-CM | POA: Diagnosis not present

## 2022-07-28 DIAGNOSIS — R6889 Other general symptoms and signs: Secondary | ICD-10-CM | POA: Diagnosis not present

## 2022-07-28 DIAGNOSIS — G319 Degenerative disease of nervous system, unspecified: Secondary | ICD-10-CM | POA: Diagnosis not present

## 2022-07-28 DIAGNOSIS — Z743 Need for continuous supervision: Secondary | ICD-10-CM | POA: Diagnosis not present

## 2022-07-28 DIAGNOSIS — R519 Headache, unspecified: Secondary | ICD-10-CM | POA: Diagnosis not present

## 2022-07-28 DIAGNOSIS — I10 Essential (primary) hypertension: Secondary | ICD-10-CM | POA: Diagnosis not present

## 2022-07-28 LAB — URINALYSIS, ROUTINE W REFLEX MICROSCOPIC
Bacteria, UA: NONE SEEN
Bilirubin Urine: NEGATIVE
Glucose, UA: NEGATIVE mg/dL
Ketones, ur: NEGATIVE mg/dL
Leukocytes,Ua: NEGATIVE
Nitrite: NEGATIVE
Protein, ur: NEGATIVE mg/dL
Specific Gravity, Urine: 1.002 — ABNORMAL LOW (ref 1.005–1.030)
pH: 8 (ref 5.0–8.0)

## 2022-07-28 LAB — BASIC METABOLIC PANEL
Anion gap: 10 (ref 5–15)
BUN: 10 mg/dL (ref 8–23)
CO2: 28 mmol/L (ref 22–32)
Calcium: 9.9 mg/dL (ref 8.9–10.3)
Chloride: 98 mmol/L (ref 98–111)
Creatinine, Ser: 0.54 mg/dL (ref 0.44–1.00)
GFR, Estimated: >60 mL/min (ref >60)
Glucose, Bld: 134 mg/dL — ABNORMAL HIGH (ref 70–99)
Potassium: 3.9 mmol/L (ref 3.5–5.1)
Sodium: 136 mmol/L (ref 135–145)

## 2022-07-28 LAB — CBC
HCT: 45.2 % (ref 36.0–46.0)
Hemoglobin: 15.4 g/dL — ABNORMAL HIGH (ref 12.0–15.0)
MCH: 32.9 pg (ref 26.0–34.0)
MCHC: 34.1 g/dL (ref 30.0–36.0)
MCV: 96.6 fL (ref 80.0–100.0)
Platelets: 298 10*3/uL (ref 150–400)
RBC: 4.68 MIL/uL (ref 3.87–5.11)
RDW: 13.5 % (ref 11.5–15.5)
WBC: 10.9 10*3/uL — ABNORMAL HIGH (ref 4.0–10.5)
nRBC: 0 % (ref 0.0–0.2)

## 2022-07-28 NOTE — ED Notes (Signed)
Pt ambulatory to bathroom with walker- standby assist- pt has steady gait with walker.

## 2022-07-28 NOTE — ED Notes (Signed)
Pedal pulses palpable. Denies redness, pain, bruising.

## 2022-07-28 NOTE — ED Provider Notes (Signed)
[facility] EMERGENCY DEPARTMENT Provider Note   CSN: [id number] Arrival date & time: 07/28/22  1619     History  Chief Complaint  Patient presents with  . Leg Problem    Kristi Sawyer is a 88 y.o. female.  HPI   Patient has a history of anxiety, GERD, palpitations, hyperlipidemia, neuropathy, osteoporosis, anemia.  Patient presents to the ED for evaluation of occultly walking.  Patient states she uses a walker normally at home.  She went out to go grocery shopping.  Patient was not using her walker but was holding onto the shopping cart.  She suddenly felt that her legs had stiffened up and it was hard for her to keep walking.  She continued to hold onto the cart.  People at the store brought her chair to sit down.  EMS was called.  Patient denies having any falls.  She was not having any chest pain.  No headache.  Since waiting in the ED she has been able to walk to the bathroom on her own.  Patient did have a similar episode 10 days ago.  She was evaluated in the ED and released.  Home Medications Prior to Admission medications   Medication Sig Start Date End Date Taking? Authorizing Provider  acetaminophen (TYLENOL) 325 MG tablet Take 2 tablets (650 mg total) by mouth every 6 (six) hours as needed for mild pain (or Fever >/= 101). 12/20/21   Roxan Hockey, MD  amLODipine (NORVASC) 5 MG tablet Take 1 tablet (5 mg total) by mouth daily. 12/21/21   Roxan Hockey, MD  ascorbic acid (VITAMIN C) 500 MG tablet Take 1 tablet (500 mg total) by mouth daily. 03/16/22   Dettinger, Fransisca Kaufmann, MD  Calcium Carbonate-Vit D-Min (CALTRATE PLUS PO) Take 1 tablet by mouth daily.    [provider]  cephALEXin (KEFLEX) 500 MG capsule Take 1 capsule (500 mg total) by mouth 2 (two) times daily. Patient not taking: Reported on 07/18/2022 05/05/22   Varney Biles, MD  Cholecalciferol (VITAMIN D) 2000 UNITS CAPS Take by mouth as directed. Take 1 tablet daily except none on saturdays and sundays     [provider]  denosumab (PROLIA) 60 MG/ML SOSY injection Inject 60 mg into the skin every 6 (six) months. Patient not taking: Reported on 07/18/2022    [provider]  Fe Fum-FePoly-Vit C-Vit B3 (INTEGRA) 62.5-62.5-40-3 MG CAPS TAKE 1 CAPSULE BY MOUTH EVERY DAY 05/31/22   Gatha Mayer, MD  GEMTESA 75 MG TABS Take 1 tablet by mouth daily. 07/14/22   [provider]  Inulin 1.5 g CHEW Chew 5 tablets by mouth daily. FIBER CHOICE    [provider]  levocetirizine (XYZAL) 5 MG tablet TAKE 1 TABLET (5 MG TOTAL) BY MOUTH AT BEDTIME AS NEEDED (ITCHING/ ALLERGIC REACTION). Patient not taking: Reported on 07/18/2022 06/13/22   Janora Norlander, DO  metoCLOPramide (REGLAN) 10 MG tablet Take 1 tablet (10 mg total) by mouth at bedtime. 03/02/22   Vladimir Crofts, PA-C  Multiple Vitamins-Minerals (CENTRUM SILVER PO) Take 1 tablet by mouth daily.    [provider]  Omega-3 Fatty Acids (FISH OIL) 1000 MG CAPS Take 1 capsule by mouth 3 (three) times daily.     [provider]  omeprazole (PRILOSEC) 20 MG capsule Take 1-2 capsules (20-40 mg total) by mouth daily. 03/02/22   Vladimir Crofts, PA-C  rosuvastatin (CRESTOR) 5 MG tablet TAKE 1 TABLET BY MOUTH ON MONDAY, [place], AND FRIDAY 06/13/22  Dettinger, Fransisca Kaufmann, MD  triamcinolone cream (KENALOG) 0.1 % Apply 1 Application topically See admin instructions. Apply 2-2 applications daily as needed for itching 03/09/22   [provider]  zinc sulfate 220 (50 Zn) MG capsule TAKE 1 CAPSULE BY MOUTH EVERY DAY 06/15/22   Dettinger, Fransisca Kaufmann, MD      Allergies    Prevnar 13 [pneumococcal 13-val conj vacc], Penicillins, Tetanus toxoids, Codeine, and Sulfonamide derivatives    Review of Systems   Review of Systems  Physical Exam Updated Vital Signs BP (!) 185/94 (BP Location: Left Arm)   Pulse 87   Temp 98.1 F (36.7 C) (Oral)   Resp 16   Ht 1.524 m (5')   Wt 43.1 kg   SpO2 96%   BMI 18.55 kg/m   Physical Exam Vitals and nursing note reviewed.  Constitutional:      General: She is not in acute distress.    Appearance: She is well-developed.  HENT:     Head: Normocephalic and atraumatic.     Right Ear: External ear normal.     Left Ear: External ear normal.  Eyes:     General: No visual field deficit or scleral icterus.       Right eye: No discharge.        Left eye: No discharge.     Conjunctiva/sclera: Conjunctivae normal.  Neck:     Trachea: No tracheal deviation.  Cardiovascular:     Rate and Rhythm: Normal rate and regular rhythm.  Pulmonary:     Effort: Pulmonary effort is normal. No respiratory distress.     Breath sounds: Normal breath sounds. No stridor. No wheezing or rales.  Abdominal:     General: Bowel sounds are normal. There is no distension.     Palpations: Abdomen is soft.     Tenderness: There is no abdominal tenderness. There is no guarding or rebound.  Musculoskeletal:        General: No tenderness.     Cervical back: Neck supple.  Skin:    General: Skin is warm and dry.     Findings: No rash.  Neurological:     Mental Status: She is alert and oriented to person, place, and time.     Cranial Nerves: No cranial nerve deficit, dysarthria or facial asymmetry.     Sensory: No sensory deficit.     Motor: No abnormal muscle tone, seizure activity or pronator drift.     Coordination: Coordination normal.     Comments:  able to hold both legs off bed for 5 seconds, sensation intact in all extremities,  no left or right sided neglect, , no nystagmus noted   Psychiatric:        Mood and Affect: Mood normal.     ED Results / Procedures / Treatments   Labs (all labs ordered are listed, but only abnormal results are displayed) Labs Reviewed  CBC - Abnormal; Notable for the following components:      Result Value   WBC 10.9 (*)    Hemoglobin 15.4 (*)    All other components within normal limits  BASIC METABOLIC PANEL - Abnormal; Notable for the  following components:   Glucose, Bld 134 (*)    All other components within normal limits  URINALYSIS, ROUTINE W REFLEX MICROSCOPIC - Abnormal; Notable for the following components:   Color, Urine COLORLESS (*)    Specific Gravity, Urine 1.002 (*)    Hgb urine dipstick SMALL (*)  All other components within normal limits    EKG EKG Interpretation  Date/Time:  Friday July 28 2022 20:12:26 EDT Ventricular Rate:  77 PR Interval:  128 QRS Duration: 75 QT Interval:  410 QTC Calculation: 464 R Axis:   86 Text Interpretation: Sinus rhythm Borderline right axis deviation Nonspecific T abnrm, anterolateral leads No significant change since last tracing Confirmed by Dorie Rank [phone]) on 07/28/2022 8:30:43 PM  Radiology CT Head Wo Contrast  Result Date: 07/28/2022 CLINICAL DATA:  Neurological deficit EXAM: CT HEAD WITHOUT CONTRAST TECHNIQUE: Contiguous axial images were obtained from the base of the skull through the vertex without intravenous contrast. RADIATION DOSE REDUCTION: This exam was performed according to the departmental dose-optimization program which includes automated exposure control, adjustment of the mA and/or kV according to patient size and/or use of iterative reconstruction technique. COMPARISON:  07/18/2022 FINDINGS: Brain: No acute intracranial findings are seen. There are no signs of bleeding within the cranium. Cortical sulci are prominent. There is decreased density in periventricular and subcortical white matter. Small lipoma is seen in the falx. Vascular: Unremarkable. Skull: No focal abnormalities are seen in calvarium. There is possible sebaceous cyst in the right frontal scalp. Sinuses/Orbits: Unremarkable. Other: No significant interval changes are noted. IMPRESSION: No acute intracranial findings are seen. Atrophy. Small vessel disease. Electronically Signed   By: Elmer Picker M.D.   On: 07/28/2022 19:54    Procedures Procedures    Medications Ordered  in ED Medications - No data to display  ED Course/ Medical Decision Making/ A&P Clinical Course as of 07/28/22 2154  Fri Jul 28, 2022  2151 Labs unremarkable.  No signs of anemia.  No signs of dehydration.  Urinalysis without any abnormalities [JK]  2151 CT does not show any acute findings. [JK]    Clinical Course User Index [JK] Dorie Rank, MD                           Medical Decision Making Patient had an episode of leg stiffness.  Sounds more like she suddenly had difficulty walking further.  More consistent with weakness and gait disturbance.  Differential does include the possibility of anemia, electrolyte abnormality dehydration, less likely stroke TIA.  Problems Addressed: Difficulty walking: chronic illness or injury with exacerbation, progression, or side effects of treatment  Amount and/or Complexity of Data Reviewed Labs: ordered. Decision-making details documented in ED Course. Radiology: ordered and independent interpretation performed.   Patient presented to the ER for evaluation of difficulty walking while she was at the grocery store.  Patient felt like her legs stopped moving properly.  Patient at baseline does need to use a walker.  Consider the possibility of stroke TIA however the patient does not have any deficits on exam.  She is able to walk in the emergency room.  Labs do not show any signs of anemia or dehydration.  Her vitals are normal without signs of bradycardia or hypotension.  Consider the possibly of claudication but she is not having any leg pain or signs of decreased perfusion.  She is not having any back pain to suggest spinal stenosis.  Evaluation and diagnostic testing in the emergency department does not suggest an emergent condition requiring admission or immediate intervention beyond what has been performed at this time.  The patient is safe for discharge and has been instructed to return immediately for worsening symptoms, change in symptoms or any  other concerns.  Recommend follow-up with her  primary care doctor.  Consider physical therapy for strengthening.  Also consider using a seated walker that she can use when trying to go out shopping.         Final Clinical Impression(s) / ED Diagnoses Final diagnoses:  Difficulty walking    Rx / DC Orders ED Discharge Orders          Ordered    For home use only DME 4 wheeled rolling walker with seat        07/28/22 2154              Dorie Rank, MD 07/28/22 2154

## 2022-07-28 NOTE — Discharge Instructions (Signed)
Follow-up with your primary care doctor.  Discussed possible physical therapy to help with leg strengthening.  Consider getting a seated walker that you can use if necessary when shopping.

## 2022-07-28 NOTE — ED Triage Notes (Signed)
Pt brought in by [facility] from [employer] when she was shopping and all of a sudden her legs "stiffened up". Hx of same 10 days ago with no diagnosis per EMS. EMS reports negative for any neuro deficits. EMS Vitals-BP 162/74, HR 92, RR 18, O2 sat 98% RA. Pt A&O.

## 2022-07-29 NOTE — ED Notes (Signed)
Pt ambulatory to bathroom with walker- standby assist- pt has steady gait with walker.

## 2022-07-29 NOTE — ED Notes (Signed)
Pt has been discharged, just sleeping in hallway bed as she does not have ride home until in the morning.

## 2022-07-30 ENCOUNTER — Other Ambulatory Visit: Payer: Self-pay | Admitting: Internal Medicine

## 2022-08-02 ENCOUNTER — Encounter: Payer: Self-pay | Admitting: Family Medicine

## 2022-08-02 ENCOUNTER — Ambulatory Visit: Payer: Medicare Other | Admitting: Family Medicine

## 2022-08-02 VITALS — BP 146/76 | HR 78 | Ht 60.0 in | Wt 95.0 lb

## 2022-08-02 DIAGNOSIS — R29898 Other symptoms and signs involving the musculoskeletal system: Secondary | ICD-10-CM

## 2022-08-02 DIAGNOSIS — R260 Ataxic gait: Secondary | ICD-10-CM | POA: Diagnosis not present

## 2022-08-02 DIAGNOSIS — M6388 Disorders of muscle in diseases classified elsewhere, other site: Secondary | ICD-10-CM

## 2022-08-02 DIAGNOSIS — R296 Repeated falls: Secondary | ICD-10-CM | POA: Diagnosis not present

## 2022-08-02 NOTE — Progress Notes (Signed)
BP (!) 146/76   Pulse 78   Ht 5' (1.524 m)   Wt 95 lb (43.1 kg)   SpO2 98%   BMI 18.55 kg/m    Subjective:   Patient ID: Kristi Sawyer, female    DOB: [birth year], 88 y.o.   MRN: [id number]  HPI: Kristi Sawyer is a 88 y.o. female presenting on 08/02/2022 for ER follow up   HPI Patient comes in today for ER follow-up for falls and weakness.  She was in the ER on 07/28/2022.  She had been in the grocery store walking without her walker and admits that she started feeling weak in her lower extremities and then went down.  She says that she did not pass out or blackout.  She says that mainly she felt her legs give out and since she did not have her walker she went down.  She denies any lightheadedness or dizziness or weakness regularly but she does admit that she does not walk as well with her age and feeling somewhat weaker.  She was using a grocery cart there and some of her walker at the time she denies any chest pain or palpitations.  She was taken to the emergency department and evaluated and found to be within normal thank you find and more suspicious towards weakness.  Relevant past medical, surgical, family and social history reviewed and updated as indicated. Interim medical history since our last visit reviewed. Allergies and medications reviewed and updated.  Review of Systems  Constitutional:  Negative for chills and fever.  Eyes:  Negative for redness and visual disturbance.  Respiratory:  Negative for chest tightness and shortness of breath.   Cardiovascular:  Negative for chest pain and leg swelling.  Musculoskeletal:  Positive for gait problem. Negative for back pain.  Skin:  Negative for rash.  Neurological:  Positive for weakness. Negative for light-headedness, numbness and headaches.  Psychiatric/Behavioral:  Negative for agitation and behavioral problems.   All other systems reviewed and are negative.   Per HPI unless specifically indicated above   Allergies as of  08/02/2022       Reactions   Prevnar 13 [pneumococcal 13-val Conj Vacc] Hives   Red/streaking arm   Penicillins Hives   Has patient had a PCN reaction causing immediate rash, facial/tongue/throat swelling, SOB or lightheadedness with hypotension: No Has patient had a PCN reaction causing severe rash involving mucus membranes or skin necrosis: No Has patient had a PCN reaction that required hospitalization: No Has patient had a PCN reaction occurring within the last 10 years: No If all of the above answers are "NO", then may proceed with Cephalosporin use.   Tetanus Toxoids Other (See Comments)   Area of injection was red and streaked.   Codeine Nausea Only   Sulfonamide Derivatives Nausea Only        Medication List        Accurate as of August 02, 2022 12:32 PM. If you have any questions, ask your nurse or doctor.          STOP taking these medications    cephALEXin 500 MG capsule Commonly known as: KEFLEX Stopped by: Fransisca Kaufmann Aminata [place], MD       TAKE these medications    acetaminophen 325 MG tablet Commonly known as: TYLENOL Take 2 tablets (650 mg total) by mouth every 6 (six) hours as needed for mild pain (or Fever >/= 101).   amLODipine 5 MG tablet Commonly known as: NORVASC Take 1  tablet (5 mg total) by mouth daily.   ascorbic acid 500 MG tablet Commonly known as: VITAMIN C Take 1 tablet (500 mg total) by mouth daily.   CALTRATE PLUS PO Take 1 tablet by mouth daily.   CENTRUM SILVER PO Take 1 tablet by mouth daily.   denosumab 60 MG/ML Sosy injection Commonly known as: PROLIA Inject 60 mg into the skin every 6 (six) months.   Fish Oil 1000 MG Caps Take 1 capsule by mouth 3 (three) times daily.   Gemtesa 75 MG Tabs Generic drug: Vibegron Take 1 tablet by mouth daily.   Integra 62.5-62.5-40-3 MG Caps TAKE 1 CAPSULE BY MOUTH EVERY DAY   Inulin 1.5 g Chew Chew 5 tablets by mouth daily. FIBER CHOICE   levocetirizine 5 MG tablet Commonly  known as: XYZAL TAKE 1 TABLET (5 MG TOTAL) BY MOUTH AT BEDTIME AS NEEDED (ITCHING/ ALLERGIC REACTION).   metoCLOPramide 10 MG tablet Commonly known as: REGLAN Take 1 tablet (10 mg total) by mouth at bedtime.   omeprazole 20 MG capsule Commonly known as: PRILOSEC Take 1-2 capsules (20-40 mg total) by mouth daily.   rosuvastatin 5 MG tablet Commonly known as: CRESTOR TAKE 1 TABLET BY MOUTH ON MONDAY, WEDNESDAY, AND FRIDAY   triamcinolone cream 0.1 % Commonly known as: KENALOG Apply 1 Application topically See admin instructions. Apply 2-2 applications daily as needed for itching   Vitamin D 50 MCG (2000 UT) Caps Take by mouth as directed. Take 1 tablet daily except none on saturdays and sundays   zinc sulfate 220 (50 Zn) MG capsule TAKE 1 CAPSULE BY MOUTH EVERY DAY         Objective:   BP (!) 146/76   Pulse 78   Ht 5' (1.524 m)   Wt 95 lb (43.1 kg)   SpO2 98%   BMI 18.55 kg/m   Wt Readings from Last 3 Encounters:  08/02/22 95 lb (43.1 kg)  07/28/22 95 lb (43.1 kg)  07/20/22 95 lb (43.1 kg)    Physical Exam Vitals and nursing note reviewed.  Constitutional:      General: She is not in acute distress.    Appearance: She is well-developed. She is not diaphoretic.  Eyes:     Conjunctiva/sclera: Conjunctivae normal.  Cardiovascular:     Rate and Rhythm: Normal rate and regular rhythm.     Heart sounds: Normal heart sounds. No murmur heard. Pulmonary:     Effort: Pulmonary effort is normal. No respiratory distress.     Breath sounds: Normal breath sounds. No wheezing.  Musculoskeletal:        General: No tenderness. Normal range of motion.  Skin:    General: Skin is warm and dry.     Findings: No rash.  Neurological:     Mental Status: She is alert and oriented to person, place, and time.     Coordination: Coordination normal.  Psychiatric:        Behavior: Behavior normal.       Assessment & Plan:   Problem List Items Addressed This Visit        Other   Ataxic gait   Relevant Orders   CBC with Differential/Platelet   CMP14+EGFR   Ambulatory referral to Physical Therapy   Other Visit Diagnoses     Recurrent falls    -  Primary   Relevant Orders   CBC with Differential/Platelet   CMP14+EGFR   Ambulatory referral to Physical Therapy   Weakness of both lower  extremities       Relevant Orders   CBC with Differential/Platelet   CMP14+EGFR   Ambulatory referral to Physical Therapy   Disorders of muscle in diseases classified elsewhere, other site       Relevant Orders   CBC with Differential/Platelet   CMP14+EGFR   Ambulatory referral to Physical Therapy       We will do blood work and order physical therapy.  She seems to be doing okay today here in the office but want to try and prevent future falls. Follow up plan: Return if symptoms worsen or fail to improve.  Counseling provided for all of the vaccine components Orders Placed This Encounter  Procedures   CBC with Differential/Platelet   CMP14+EGFR   Ambulatory referral to Physical Therapy    [name], MD [facility] 08/02/2022, 12:32 PM

## 2022-08-09 ENCOUNTER — Ambulatory Visit: Payer: Medicare Other | Attending: Family Medicine

## 2022-08-09 ENCOUNTER — Other Ambulatory Visit: Payer: Self-pay

## 2022-08-09 DIAGNOSIS — R29898 Other symptoms and signs involving the musculoskeletal system: Secondary | ICD-10-CM | POA: Insufficient documentation

## 2022-08-09 DIAGNOSIS — M6281 Muscle weakness (generalized): Secondary | ICD-10-CM | POA: Insufficient documentation

## 2022-08-09 DIAGNOSIS — M6388 Disorders of muscle in diseases classified elsewhere, other site: Secondary | ICD-10-CM | POA: Diagnosis not present

## 2022-08-09 DIAGNOSIS — R296 Repeated falls: Secondary | ICD-10-CM | POA: Insufficient documentation

## 2022-08-09 DIAGNOSIS — Z9181 History of falling: Secondary | ICD-10-CM | POA: Insufficient documentation

## 2022-08-09 DIAGNOSIS — R260 Ataxic gait: Secondary | ICD-10-CM | POA: Diagnosis not present

## 2022-08-09 NOTE — Therapy (Signed)
OUTPATIENT PHYSICAL THERAPY LOWER EXTREMITY EVALUATION   Patient Name: Kristi Sawyer MRN: [id number] DOB:July 28, 1926, 88 y.o., female Today's Date: 08/09/2022   PT End of Session - 08/09/22 0947     Visit Number 1    Number of Visits 1    Date for PT Re-Evaluation 08/10/22    PT Start Time 0947    PT Stop Time 1027    PT Time Calculation (min) 40 min    Activity Tolerance Patient tolerated treatment well    Behavior During Therapy WFL for tasks assessed/performed             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 benign tumor    Patient Active Problem List   Diagnosis Date Noted   Falls 12/19/2021   Ataxic gait    Physical deconditioning    Osteoporosis without current pathological fracture 11/23/2020   Hypertension 10/28/2017   Overactive bladder 06/18/2015   Hyperlipidemia 09/26/2010   ANXIETY 09/26/2010   GERD (gastroesophageal reflux disease) 09/26/2010   CELIAC SPRUE 09/26/2010   REFERRING PROVIDER: Dettinger, Fransisca Kaufmann, MD  REFERRING DIAG: Recurrent falls; Weakness of both lower extremities; Disorders of muscle in diseases classified elsewhere, other site; Ataxic gait  THERAPY DIAG:  History of  falling  Muscle weakness (generalized)  Rationale for Evaluation and Treatment Rehabilitation  ONSET DATE: chronic  SUBJECTIVE:   SUBJECTIVE STATEMENT: Patient reports that her legs have been giving out on her causing her to fall. She notes that most of her fall have been when she falls into her walker. She thinks she has fallen about 3 or 4 times in the past six months, but she has not been injured with any of these falls. She has an alert button in case she falls and can't get up, but she has not had to use it yet. She has not had any therapy before for her falls.   PERTINENT HISTORY: HTN, osteoporosis, and history of falling   PAIN:  Are you having pain? No  PRECAUTIONS: Fall  WEIGHT BEARING RESTRICTIONS No  FALLS:  Has patient fallen in last 6 months? Yes. Number of falls 3 or 4; most recent fall: she was walking in the grocery store using the buggy and not the walker when her legs gave out  LIVING ENVIRONMENT: Lives with: lives alone Lives in: House/apartment Stairs: Yes: External: 2 steps; can reach both Has following equipment at home: Gilford Rile - 2 wheeled and Grab bars  OCCUPATION: retired  PLOF: Independent with basic ADLs  PATIENT GOALS improved safety and strength   OBJECTIVE:  COGNITION:  Overall cognitive status: Within functional limits for tasks assessed     SENSATION: Patient reported no numbness or tingling  POSTURE: rounded shoulders, forward head, and decreased lumbar lordosis  LOWER EXTREMITY ROM: WFL for activities assessed  LOWER EXTREMITY MMT:  MMT Right eval Left eval  Hip flexion 3/5 3/5  Hip extension    Hip abduction    Hip adduction    Hip internal rotation    Hip external rotation    Knee flexion 3+/5 4-/5  Knee extension 3/5 3/5  Ankle dorsiflexion 3/5 3/5  Ankle plantarflexion    Ankle inversion    Ankle eversion     (Blank rows = not tested)  FUNCTIONAL TESTS:  5 times sit to stand: 22.37 seconds; intermittent UE  support from armrests, multiple attempts to go from sitting to standing Timed up and go (TUG): 29.62 seconds with rolling walker  GAIT: Assistive device utilized: Environmental consultant - 2 wheeled Level of assistance: Modified independence Comments: decreased gait speed, shuffling pattern   TODAY'S TREATMENT:                                   9/27 EXERCISE LOG  Exercise Repetitions and Resistance Comments  LAQ 10 reps   Seated marching 10 reps each   Seated heel/toe raises 10 reps each             Blank cell = exercise not performed today    PATIENT EDUCATION:  Education details: HEP, risk of falling, recommend that she continue with physical therapy  Person educated: Patient Education method: Explanation Education comprehension: verbalized understanding   HOME EXERCISE PROGRAM: [id number]  ASSESSMENT:  CLINICAL IMPRESSION: Patient is a 88 y.o. female who was seen today for physical therapy evaluation and treatment for a history of multiple falls. She is at a high risk of falling as evidenced by her history of multiple falls, gait mechanics, muscular weakness, and timed up and go and five time sit to stand assessments. She was provided a HEP which she was able to safely and properly perform. She was encouraged to continue with skilled physical therapy, but she reported that she was unable to do this due to financial reasons. She will be discharged at this time with a HEP.   PHYSICAL THERAPY DISCHARGE SUMMARY  Visits from Start of Care: 1  Current functional level related to goals / functional outcomes: Patient requested to be discharged at this time due to financial reasons.    Remaining deficits: See evaluation   Education / Equipment: HEP   Patient agrees to discharge. Patient goals were not met. Patient is being discharged due to financial reasons.    OBJECTIVE IMPAIRMENTS Abnormal gait, decreased activity tolerance, decreased balance, decreased mobility, difficulty walking, and  decreased strength.   ACTIVITY LIMITATIONS carrying, lifting, bending, standing, squatting, stairs, transfers, and locomotion level  PARTICIPATION LIMITATIONS: meal prep, cleaning, laundry, shopping, community activity, and yard work  PERSONAL FACTORS Age, Fitness, Time since onset of injury/illness/exacerbation, and 3+ comorbidities: HTN, osteoporosis, and history of falling   are also affecting patient's functional outcome.   REHAB POTENTIAL: Poor    CLINICAL DECISION MAKING: Evolving/moderate complexity  EVALUATION COMPLEXITY: Moderate   GOALS: Goals reviewed with patient? No   None provided as patient requested to be discharged from physical therapy  PLAN: PT FREQUENCY: one time visit  PLANNED INTERVENTIONS: Therapeutic exercises and Re-evaluation  Darlin Coco, PT 08/09/2022, 3:31 PM

## 2022-09-07 ENCOUNTER — Other Ambulatory Visit: Payer: Self-pay | Admitting: Family Medicine

## 2022-09-07 DIAGNOSIS — R42 Dizziness and giddiness: Secondary | ICD-10-CM

## 2022-09-28 ENCOUNTER — Other Ambulatory Visit: Payer: Self-pay | Admitting: Internal Medicine

## 2022-10-18 ENCOUNTER — Ambulatory Visit (INDEPENDENT_AMBULATORY_CARE_PROVIDER_SITE_OTHER): Payer: Medicare Other | Admitting: Family Medicine

## 2022-10-18 ENCOUNTER — Encounter: Payer: Self-pay | Admitting: Family Medicine

## 2022-10-18 VITALS — BP 147/72 | HR 81 | Ht 61.0 in | Wt 95.0 lb

## 2022-10-18 DIAGNOSIS — I10 Essential (primary) hypertension: Secondary | ICD-10-CM | POA: Diagnosis not present

## 2022-10-18 DIAGNOSIS — E782 Mixed hyperlipidemia: Secondary | ICD-10-CM | POA: Diagnosis not present

## 2022-10-18 DIAGNOSIS — N3281 Overactive bladder: Secondary | ICD-10-CM | POA: Diagnosis not present

## 2022-10-18 MED ORDER — ROSUVASTATIN CALCIUM 5 MG PO TABS: ORAL_TABLET | ORAL | 1 refills | Status: DC

## 2022-10-18 NOTE — Progress Notes (Signed)
BP (!) 147/72   Pulse 81   Ht _0  (1.549 m)   Wt 95 lb (43.1 kg)   SpO2 96%   BMI 17.95 kg/m    Subjective:   Patient ID: Kristi Sawyer, female    DOB: Mar 27, 1926, 88 y.o.   MRN: [id number]  HPI: Kristi Sawyer is a 88 y.o. female presenting on 10/18/2022 for Medical Management of Chronic Issues and Hypertension   HPI Hypertension Patient is currently on amlodipine, and their blood pressure today is 158/72. Patient denies any lightheadedness or dizziness. Patient denies headaches, blurred vision, chest pains, shortness of breath, or weakness. Denies any side effects from medication and is content with current medication.   Hyperlipidemia Patient is coming in for recheck of his hyperlipidemia. The patient is currently taking Crestor. They deny any issues with myalgias or history of liver damage from it. They deny any focal numbness or weakness or chest pain.   GERD Patient is currently on Reglan and omeprazole.  She denies any major symptoms or abdominal pain or belching or burping. She denies any blood in her stool or lightheadedness or dizziness.   Relevant past medical, surgical, family and social history reviewed and updated as indicated. Interim medical history since our last visit reviewed. Allergies and medications reviewed and updated.  Review of Systems  Constitutional:  Negative for chills and fever.  Eyes:  Negative for visual disturbance.  Respiratory:  Negative for chest tightness and shortness of breath.   Cardiovascular:  Negative for chest pain and leg swelling.  Gastrointestinal:  Negative for abdominal pain and constipation.  Genitourinary:  Negative for difficulty urinating and dysuria.  Musculoskeletal:  Positive for gait problem (Uses walker now but does pretty well with that.  She says she still doing exercises from therapy at home). Negative for arthralgias and back pain.  Skin:  Negative for rash.  Neurological:  Negative for dizziness, light-headedness  and headaches.  Psychiatric/Behavioral:  Negative for agitation and behavioral problems.   All other systems reviewed and are negative.   Per HPI unless specifically indicated above   Allergies as of 10/18/2022       Reactions   Prevnar 13 [pneumococcal 13-val Conj Vacc] Hives   Red/streaking arm   Penicillins Hives   Has patient had a PCN reaction causing immediate rash, facial/tongue/throat swelling, SOB or lightheadedness with hypotension: No Has patient had a PCN reaction causing severe rash involving mucus membranes or skin necrosis: No Has patient had a PCN reaction that required hospitalization: No Has patient had a PCN reaction occurring within the last 10 years: No If all of the above answers are "NO", then may proceed with Cephalosporin use.   Tetanus Toxoids Other (See Comments)   Area of injection was red and streaked.   Codeine Nausea Only   Sulfonamide Derivatives Nausea Only        Medication List        Accurate as of October 18, 2022  1:13 PM. If you have any questions, ask your nurse or doctor.          acetaminophen 325 MG tablet Commonly known as: TYLENOL Take 2 tablets (650 mg total) by mouth every 6 (six) hours as needed for mild pain (or Fever >/= 101).   amLODipine 5 MG tablet Commonly known as: NORVASC Take 1 tablet (5 mg total) by mouth daily.   CALTRATE PLUS PO Take 1 tablet by mouth daily.   CENTRUM SILVER PO Take 1 tablet by  mouth daily.   [facility] Vitamin C 500 MG tablet Generic drug: ascorbic acid TAKE 1 TABLET (500 MG TOTAL) BY MOUTH DAILY.   denosumab 60 MG/ML Sosy injection Commonly known as: PROLIA Inject 60 mg into the skin every 6 (six) months.   Fish Oil 1000 MG Caps Take 1 capsule by mouth 3 (three) times daily.   Gemtesa 75 MG Tabs Generic drug: Vibegron Take 1 tablet by mouth daily.   Integra 62.5-62.5-40-3 MG Caps TAKE 1 CAPSULE BY MOUTH EVERY DAY   Inulin 1.5 g Chew Chew 5 tablets by mouth daily. FIBER CHOICE    levocetirizine 5 MG tablet Commonly known as: XYZAL TAKE 1 TABLET (5 MG TOTAL) BY MOUTH AT BEDTIME AS NEEDED (ITCHING/ ALLERGIC REACTION).   metoCLOPramide 10 MG tablet Commonly known as: REGLAN Take 1 tablet (10 mg total) by mouth at bedtime.   omeprazole 20 MG capsule Commonly known as: PRILOSEC Take 1-2 capsules (20-40 mg total) by mouth daily.   rosuvastatin 5 MG tablet Commonly known as: CRESTOR TAKE 1 TABLET BY MOUTH ON MONDAY, WEDNESDAY, AND FRIDAY   triamcinolone cream 0.1 % Commonly known as: KENALOG Apply 1 Application topically See admin instructions. Apply 2-2 applications daily as needed for itching   Vitamin D 50 MCG (2000 UT) Caps Take by mouth as directed. Take 1 tablet daily except none on saturdays and sundays   zinc sulfate 220 (50 Zn) MG capsule TAKE 1 CAPSULE BY MOUTH EVERY DAY         Objective:   Ht _0  (1.549 m)   Wt 95 lb (43.1 kg)   BMI 17.95 kg/m   Wt Readings from Last 3 Encounters:  10/18/22 95 lb (43.1 kg)  08/02/22 95 lb (43.1 kg)  07/28/22 95 lb (43.1 kg)    Physical Exam Vitals and nursing note reviewed.  Constitutional:      General: She is not in acute distress.    Appearance: She is well-developed. She is not diaphoretic.  Eyes:     Conjunctiva/sclera: Conjunctivae normal.  Cardiovascular:     Rate and Rhythm: Normal rate and regular rhythm.     Heart sounds: Normal heart sounds. No murmur heard. Pulmonary:     Effort: Pulmonary effort is normal. No respiratory distress.     Breath sounds: Normal breath sounds. No wheezing.  Musculoskeletal:        General: No swelling or tenderness. Normal range of motion.  Skin:    General: Skin is warm and dry.     Findings: No rash.  Neurological:     Mental Status: She is alert and oriented to person, place, and time.     Coordination: Coordination normal.  Psychiatric:        Behavior: Behavior normal.       Assessment & Plan:   Problem List Items Addressed This  Visit       Cardiovascular and Mediastinum   Hypertension - Primary   Relevant Medications   rosuvastatin (CRESTOR) 5 MG tablet   Other Relevant Orders   CBC with Differential/Platelet   CMP14+EGFR   Lipid panel     Genitourinary   Overactive bladder   Relevant Orders   CBC with Differential/Platelet   CMP14+EGFR   Lipid panel     Other   Hyperlipidemia   Relevant Medications   rosuvastatin (CRESTOR) 5 MG tablet   Other Relevant Orders   CBC with Differential/Platelet   CMP14+EGFR   Lipid panel    Will do blood  work, will keep a close eye on the blood pressure, allowing permissive hypertension but it is slightly higher than would like but is also concerned because of her risk for falls and her age.  Monitor in the future. Follow up plan: Return in about 6 months (around 04/19/2023), or if symptoms worsen or fail to improve, for Hypertension recheck.  Counseling provided for all of the vaccine components Orders Placed This Encounter  Procedures   CBC with Differential/Platelet   CMP14+EGFR   Lipid panel    [name], MD [facility] 10/18/2022, 1:13 PM

## 2022-10-19 ENCOUNTER — Other Ambulatory Visit: Payer: Self-pay

## 2022-10-19 ENCOUNTER — Encounter (HOSPITAL_COMMUNITY): Payer: Self-pay | Admitting: *Deleted

## 2022-10-19 ENCOUNTER — Emergency Department (HOSPITAL_COMMUNITY): Payer: Medicare Other

## 2022-10-19 ENCOUNTER — Emergency Department (HOSPITAL_COMMUNITY)
Admission: EM | Admit: 2022-10-19 | Discharge: 2022-10-20 | Disposition: A | Discharge status: Home / Self Care | Payer: Medicare Other | Attending: Emergency Medicine | Admitting: Emergency Medicine

## 2022-10-19 DIAGNOSIS — R6889 Other general symptoms and signs: Secondary | ICD-10-CM | POA: Diagnosis not present

## 2022-10-19 DIAGNOSIS — W19XXXA Unspecified fall, initial encounter: Secondary | ICD-10-CM | POA: Diagnosis not present

## 2022-10-19 DIAGNOSIS — R531 Weakness: Secondary | ICD-10-CM | POA: Diagnosis not present

## 2022-10-19 DIAGNOSIS — Z794 Long term (current) use of insulin: Secondary | ICD-10-CM | POA: Diagnosis not present

## 2022-10-19 DIAGNOSIS — Z743 Need for continuous supervision: Secondary | ICD-10-CM | POA: Diagnosis not present

## 2022-10-19 DIAGNOSIS — R0902 Hypoxemia: Secondary | ICD-10-CM | POA: Diagnosis not present

## 2022-10-19 DIAGNOSIS — Z79899 Other long term (current) drug therapy: Secondary | ICD-10-CM | POA: Diagnosis not present

## 2022-10-19 DIAGNOSIS — R2689 Other abnormalities of gait and mobility: Secondary | ICD-10-CM | POA: Insufficient documentation

## 2022-10-19 LAB — CBC WITH DIFFERENTIAL/PLATELET
Abs Immature Granulocytes: 0.03 10*3/uL (ref 0.00–0.07)
Basophils Absolute: 0.1 10*3/uL (ref 0.0–0.2)
Basophils Absolute: 0 10*3/uL (ref 0.0–0.1)
Basophils Relative: 1 %
Basos: 1 %
EOS (ABSOLUTE): 0 10*3/uL (ref 0.0–0.4)
Eos: 1 %
Eosinophils Absolute: 0 10*3/uL (ref 0.0–0.5)
Eosinophils Relative: 0 %
HCT: 41.6 % (ref 36.0–46.0)
Hematocrit: 41 % (ref 34.0–46.6)
Hemoglobin: 14.1 g/dL (ref 11.1–15.9)
Hemoglobin: 13.9 g/dL (ref 12.0–15.0)
Immature Grans (Abs): 0 10*3/uL (ref 0.0–0.1)
Immature Granulocytes: 0 %
Immature Granulocytes: 0 %
Lymphocytes Absolute: 2.7 10*3/uL (ref 0.7–3.1)
Lymphocytes Relative: 11 %
Lymphs Abs: 0.9 10*3/uL (ref 0.7–4.0)
Lymphs: 32 %
MCH: 32.8 pg (ref 26.6–33.0)
MCH: 32.2 pg (ref 26.0–34.0)
MCHC: 34.4 g/dL (ref 31.5–35.7)
MCHC: 33.4 g/dL (ref 30.0–36.0)
MCV: 95 fL (ref 79–97)
MCV: 96.3 fL (ref 80.0–100.0)
Monocytes Absolute: 1.1 10*3/uL — ABNORMAL HIGH (ref 0.1–0.9)
Monocytes Absolute: 0.8 10*3/uL (ref 0.1–1.0)
Monocytes Relative: 10 %
Monocytes: 13 %
Neutro Abs: 6.5 10*3/uL (ref 1.7–7.7)
Neutrophils Absolute: 4.6 10*3/uL (ref 1.4–7.0)
Neutrophils Relative %: 78 %
Neutrophils: 53 %
Platelets: 265 10*3/uL (ref 150–450)
Platelets: 245 10*3/uL (ref 150–400)
RBC: 4.3 x10E6/uL (ref 3.77–5.28)
RBC: 4.32 MIL/uL (ref 3.87–5.11)
RDW: 11.2 % — ABNORMAL LOW (ref 11.7–15.4)
RDW: 12 % (ref 11.5–15.5)
WBC: 8.4 10*3/uL (ref 3.4–10.8)
WBC: 8.2 10*3/uL (ref 4.0–10.5)
nRBC: 0 % (ref 0.0–0.2)

## 2022-10-19 LAB — URINALYSIS, ROUTINE W REFLEX MICROSCOPIC
Bacteria, UA: NONE SEEN
Bilirubin Urine: NEGATIVE
Glucose, UA: NEGATIVE mg/dL
Hgb urine dipstick: NEGATIVE
Ketones, ur: NEGATIVE mg/dL
Leukocytes,Ua: NEGATIVE
Nitrite: NEGATIVE
Protein, ur: 100 mg/dL — AB
Specific Gravity, Urine: 1.015 (ref 1.005–1.030)
pH: 7 (ref 5.0–8.0)

## 2022-10-19 LAB — COMPREHENSIVE METABOLIC PANEL
ALT: 23 U/L (ref 0–44)
AST: 21 U/L (ref 15–41)
Albumin: 4 g/dL (ref 3.5–5.0)
Alkaline Phosphatase: 43 U/L (ref 38–126)
Anion gap: 11 (ref 5–15)
BUN: 15 mg/dL (ref 8–23)
CO2: 26 mmol/L (ref 22–32)
Calcium: 9.8 mg/dL (ref 8.9–10.3)
Chloride: 96 mmol/L — ABNORMAL LOW (ref 98–111)
Creatinine, Ser: 0.6 mg/dL (ref 0.44–1.00)
GFR, Estimated: >60 mL/min (ref >60)
Glucose, Bld: 133 mg/dL — ABNORMAL HIGH (ref 70–99)
Potassium: 4 mmol/L (ref 3.5–5.1)
Sodium: 133 mmol/L — ABNORMAL LOW (ref 135–145)
Total Bilirubin: 0.6 mg/dL (ref 0.3–1.2)
Total Protein: 6.9 g/dL (ref 6.5–8.1)

## 2022-10-19 LAB — CMP14+EGFR
ALT: 18 IU/L (ref 0–32)
AST: 18 IU/L (ref 0–40)
Albumin/Globulin Ratio: 2.7 — ABNORMAL HIGH (ref 1.2–2.2)
Albumin: 4.8 g/dL — ABNORMAL HIGH (ref 3.6–4.6)
Alkaline Phosphatase: 49 IU/L (ref 44–121)
BUN/Creatinine Ratio: 16 (ref 12–28)
BUN: 10 mg/dL (ref 10–36)
Bilirubin Total: <0.2 mg/dL (ref 0.0–1.2)
CO2: 26 mmol/L (ref 20–29)
Calcium: 10 mg/dL (ref 8.7–10.3)
Chloride: 96 mmol/L (ref 96–106)
Creatinine, Ser: 0.61 mg/dL (ref 0.57–1.00)
Globulin, Total: 1.8 g/dL (ref 1.5–4.5)
Glucose: 86 mg/dL (ref 70–99)
Potassium: 4.4 mmol/L (ref 3.5–5.2)
Sodium: 137 mmol/L (ref 134–144)
Total Protein: 6.6 g/dL (ref 6.0–8.5)
eGFR: 83 mL/min/{1.73_m2} (ref >59)

## 2022-10-19 LAB — LIPID PANEL
Chol/HDL Ratio: 2.1 ratio (ref 0.0–4.4)
Cholesterol, Total: 188 mg/dL (ref 100–199)
HDL: 91 mg/dL (ref >39)
LDL Chol Calc (NIH): 82 mg/dL (ref 0–99)
Triglycerides: 84 mg/dL (ref 0–149)
VLDL Cholesterol Cal: 15 mg/dL (ref 5–40)

## 2022-10-19 MED ORDER — AMLODIPINE BESYLATE 5 MG PO TABS: 5.0000 mg | ORAL_TABLET | Freq: Every day | ORAL | Status: DC

## 2022-10-19 MED ORDER — PANTOPRAZOLE SODIUM 40 MG PO TBEC: 40.0000 mg | DELAYED_RELEASE_TABLET | Freq: Every day | ORAL | Status: DC

## 2022-10-19 MED ORDER — PANTOPRAZOLE SODIUM 40 MG PO TBEC
40.0000 mg | DELAYED_RELEASE_TABLET | Freq: Every day | ORAL | Status: DC
  Administered 2022-10-20: 40 mg via ORAL
  Filled 2022-10-19: qty 1

## 2022-10-19 MED ORDER — MIRABEGRON ER 25 MG PO TB24
25.0000 mg | ORAL_TABLET | Freq: Every day | ORAL | Status: DC
  Administered 2022-10-20: 25 mg via ORAL
  Filled 2022-10-19: qty 1

## 2022-10-19 MED ORDER — METOCLOPRAMIDE HCL 10 MG PO TABS
10.0000 mg | ORAL_TABLET | Freq: Every day | ORAL | Status: DC
  Administered 2022-10-19: 10 mg via ORAL
  Filled 2022-10-19: qty 1

## 2022-10-19 MED ORDER — AMLODIPINE BESYLATE 5 MG PO TABS
5.0000 mg | ORAL_TABLET | Freq: Every day | ORAL | Status: DC
  Administered 2022-10-20: 5 mg via ORAL
  Filled 2022-10-19: qty 1

## 2022-10-19 MED ORDER — MIRABEGRON ER 25 MG PO TB24: 25.0000 mg | ORAL_TABLET | Freq: Every day | ORAL | Status: DC

## 2022-10-19 MED ORDER — ROSUVASTATIN CALCIUM 5 MG PO TABS
5.0000 mg | ORAL_TABLET | ORAL | Status: DC
  Administered 2022-10-20: 5 mg via ORAL
  Filled 2022-10-19: qty 1

## 2022-10-19 NOTE — ED Provider Notes (Signed)
[facility] EMERGENCY DEPARTMENT Provider Note   CSN: [id number] Arrival date & time: 10/19/22  4765     History  Chief Complaint  Patient presents with   Weakness    Kristi Sawyer is a 88 y.o. female.  HPI Patient presents with weakness.  She notes that she has had similar prior episodes.  Today she felt weak in her lower extremities, no asymmetry, had difficulty standing and she slipped from her chair.  She denies pain, denies blood thinner use, denies recent change in medication, diet, activity.    Home Medications Prior to Admission medications   Medication Sig Start Date End Date Taking? Authorizing Provider  acetaminophen (TYLENOL) 325 MG tablet Take 2 tablets (650 mg total) by mouth every 6 (six) hours as needed for mild pain (or Fever >/= 101). 12/20/21  Yes Emokpae, Courage, MD  amLODipine (NORVASC) 5 MG tablet Take 1 tablet (5 mg total) by mouth daily. 12/21/21  Yes Emokpae, Courage, MD  Calcium Carbonate-Vit D-Min (CALTRATE PLUS PO) Take 1 tablet by mouth daily.   Yes [provider]  Cholecalciferol (VITAMIN D) 2000 UNITS CAPS Take by mouth as directed. Take 1 tablet daily except none on saturdays and sundays   Yes [provider]  [facility] VITAMIN C 500 MG tablet TAKE 1 TABLET (500 MG TOTAL) BY MOUTH DAILY. 09/07/22  Yes Dettinger, Fransisca Kaufmann, MD  Fe Fum-FePoly-Vit C-Vit B3 (INTEGRA) 62.5-62.5-40-3 MG CAPS TAKE 1 CAPSULE BY MOUTH EVERY DAY 09/28/22  Yes Gatha Mayer, MD  GEMTESA 75 MG TABS Take 1 tablet by mouth daily. 07/14/22  Yes [provider]  Inulin 1.5 g CHEW Chew 5 tablets by mouth daily. FIBER CHOICE   Yes [provider]  levocetirizine (XYZAL) 5 MG tablet TAKE 1 TABLET (5 MG TOTAL) BY MOUTH AT BEDTIME AS NEEDED (ITCHING/ ALLERGIC REACTION). 06/13/22  Yes Gottschalk, Leatrice Jewels M, DO  metoCLOPramide (REGLAN) 10 MG tablet Take 1 tablet (10 mg total) by mouth at bedtime. 03/02/22  Yes Vladimir Crofts, PA-C  Multiple Vitamins-Minerals (CENTRUM  SILVER PO) Take 1 tablet by mouth daily.   Yes [provider]  Omega-3 Fatty Acids (FISH OIL) 1000 MG CAPS Take 1 capsule by mouth 3 (three) times daily.    Yes [provider]  omeprazole (PRILOSEC) 20 MG capsule Take 1-2 capsules (20-40 mg total) by mouth daily. Patient taking differently: Take 20-40 mg by mouth 2 (two) times daily before a meal. 03/02/22  Yes Vicie Mutters R, PA-C  rosuvastatin (CRESTOR) 5 MG tablet TAKE 1 TABLET BY MOUTH ON MONDAY, WEDNESDAY, AND FRIDAY 10/18/22  Yes Dettinger, Fransisca Kaufmann, MD  triamcinolone cream (KENALOG) 0.1 % Apply 1 Application topically See admin instructions. Apply 2-2 applications daily as needed for itching 03/09/22  Yes [provider]  zinc sulfate 220 (50 Zn) MG capsule TAKE 1 CAPSULE BY MOUTH EVERY DAY 06/15/22  Yes Dettinger, Fransisca Kaufmann, MD      Allergies    Prevnar 13 [pneumococcal 13-val conj vacc], Penicillins, Tetanus toxoids, Codeine, and Sulfonamide derivatives    Review of Systems   Review of Systems  All other systems reviewed and are negative.   Physical Exam Updated Vital Signs BP (!) 146/74   Pulse 80   Temp 98.6 F (37 C) (Oral)   Resp 20   Ht 5' (1.524 m)   Wt 43.1 kg   SpO2 96%   BMI 18.55 kg/m  Physical Exam Vitals and nursing note reviewed.  Constitutional:  Appearance: She is well-developed.     Comments: Thin elderly female awake alert speaking clearly  HENT:     Head: Normocephalic and atraumatic.  Eyes:     Conjunctiva/sclera: Conjunctivae normal.  Cardiovascular:     Rate and Rhythm: Normal rate and regular rhythm.  Pulmonary:     Effort: Pulmonary effort is normal. No respiratory distress.     Breath sounds: Normal breath sounds. No stridor.  Abdominal:     General: There is no distension.  Musculoskeletal:     Comments: No deformities.  She flexes each hip independently, with full range of motion and strength, pelvis is stable  Skin:    General: Skin is warm and dry.   Neurological:     Mental Status: She is alert and oriented to person, place, and time.     Cranial Nerves: No cranial nerve deficit.     Motor: Atrophy present. No tremor or abnormal muscle tone.  Psychiatric:        Mood and Affect: Mood normal.     ED Results / Procedures / Treatments   Labs (all labs ordered are listed, but only abnormal results are displayed) Labs Reviewed  COMPREHENSIVE METABOLIC PANEL - Abnormal; Notable for the following components:      Result Value   Sodium 133 (*)    Chloride 96 (*)    Glucose, Bld 133 (*)    All other components within normal limits  URINALYSIS, ROUTINE W REFLEX MICROSCOPIC - Abnormal; Notable for the following components:   APPearance HAZY (*)    Protein, ur 100 (*)    All other components within normal limits  CBC WITH DIFFERENTIAL/PLATELET    EKG EKG Interpretation  Date/Time:  Thursday October 19 2022 18:59:28 EST Ventricular Rate:  92 PR Interval:  118 QRS Duration: 100 QT Interval:  341 QTC Calculation: 422 R Axis:   124 Text Interpretation: Sinus tachycardia Multiple premature complexes, vent & supraven Borderline short PR interval Probable right ventricular hypertrophy Probable lateral infarct, age indeterminate Abnormal ECG Confirmed by Carmin Muskrat [phone]) on 10/19/2022 7:29:37 PM  Radiology CT Head Wo Contrast  Result Date: 10/19/2022 CLINICAL DATA:  Stroke suspected.  Bilateral leg weakness. EXAM: CT HEAD WITHOUT CONTRAST TECHNIQUE: Contiguous axial images were obtained from the base of the skull through the vertex without intravenous contrast. RADIATION DOSE REDUCTION: This exam was performed according to the departmental dose-optimization program which includes automated exposure control, adjustment of the mA and/or kV according to patient size and/or use of iterative reconstruction technique. COMPARISON:  CT Head 07/28/22 FINDINGS: Brain: No evidence of hemorrhage. No CT evidence of a new infarct. Sequela of  moderate chronic microvascular ischemic change with advanced generalized volume loss. No hydrocephalus. No extra-axial fluid collection. Small lipoma along the falx. Vascular: No hyperdense vessel or unexpected calcification. Skull: Normal. Negative for fracture or focal lesion. Sinuses/Orbits: Bilateral lens replacements. Paranasal sinuses and mastoid air cells are clear. Other: Unchanged 1.5 cm rounded subcutaneous lesion along the right frontal scalp, likely a sebaceous cyst. IMPRESSION: No acute intracranial abnormality. Electronically Signed   By: Marin Roberts M.D.   On: 10/19/2022 19:46    Procedures Procedures    Medications Ordered in ED Medications  amLODipine (NORVASC) tablet 5 mg (has no administration in time range)  mirabegron ER (MYRBETRIQ) tablet 25 mg (has no administration in time range)  metoCLOPramide (REGLAN) tablet 10 mg (has no administration in time range)  pantoprazole (PROTONIX) EC tablet 40 mg (has no administration in time  range)  rosuvastatin (CRESTOR) tablet 5 mg (has no administration in time range)    ED Course/ Medical Decision Making/ A&P                           Medical Decision Making Elderly female presents with lower extremity weakness, difficulty with ambulation as she was able to do so with assistance when she arrived to the ED.  On exam, while patient is supine her findings are reassuring, no focal neurodeficits, no pain, no deformity.  Differential including stroke, less likely, electrolyte abnormalities, spinal stenosis, dehydration, infection all considered.  Amount and/or Complexity of Data Reviewed External Data Reviewed: notes.    Details: Eval for gait difficulty in September of this year with similar presentation Labs: ordered. Decision-making details documented in ED Course. Radiology: ordered. Decision-making details documented in ED Course. ECG/medicine tests: independent interpretation performed. Decision-making details documented in ED  Course.  Risk OTC drugs. Decision regarding hospitalization.   9:27 PM Patient awake and alert.  Labs reviewed, discussed, no evidence for urinary tract infection, electrolytes essentially unremarkable, aside from mild hyperglycemia.  X-ray without evidence for acute fracture head CT without intracranial abnormality.  Though she can move her lower extremities, she is diminished capacity for ambulation, she requests skilled nursing facility evaluation.  We discussed this, the fact that this cannot currently available and she will have to stay overnight, she is amenable to this, and will be placed in [facility] boarding status, pending social work, PT eval.        Final Clinical Impression(s) / ED Diagnoses Final diagnoses:  Fall, initial encounter  Weakness     Carmin Muskrat, MD 10/19/22 2127

## 2022-10-19 NOTE — ED Notes (Signed)
Family updated as to patient's status.

## 2022-10-19 NOTE — ED Triage Notes (Signed)
Pt brought in from home by [facility] with c/o bilateral leg weakness that started today around 1430. EMS reports pt was sitting in her kitchen chair and her legs became so weak that she slipped out of her chair. Pt denies injury and use of blood thinners.

## 2022-10-20 NOTE — ED Notes (Signed)
Pt has friends at bedside

## 2022-10-20 NOTE — ED Notes (Signed)
Pt asked to make phn call had to access pts info to retrieve [facility] number

## 2022-10-20 NOTE — Discharge Instructions (Addendum)
Home health request have been submitted.  Follow-up with your doctor.  Return for any new or worse symptoms.

## 2022-10-20 NOTE — ED Provider Notes (Signed)
Patient cleared by Education officer, museum and case manager for discharge home with home health physical therapy occupational therapy and home health nurse.   Fredia Sorrow, MD 10/20/22 1525

## 2022-10-20 NOTE — Progress Notes (Signed)
Transition of Care [facility]) - Emergency Department Mini Assessment   Patient Details  Name: Kristi Sawyer MRN: [id number] Date of Birth: May 22, 1926  Transition of Care [facility]) CM/SW Contact:    Joaquin Courts, RN Phone Number: 10/20/2022, 11:20 AM   Clinical Narrative: CM met with patient at bedside to discuss discharge planning after being notified by PT that patient is recommended for [facility].  Patient reports she lives at home alone but has friends that assist her and she would like to return home.  Patient is interested in [facility] and agreeable to services with no agency preference.  Patient set up with [facility] for HHPT/OT/RN.  Agency rep Marjory Lies provided with referral and accepted.  Bedside RN and MD notified (orders requested).  No further TOC needs.    ED Mini Assessment: What brought you to the Emergency Department? : weakness  Barriers to Discharge: No Barriers Identified  Barrier interventions: [place] set up  Means of departure: Ambulance  Interventions which prevented an admission or readmission: [place] or Services    Patient Contact and Communications     Spoke with: patient Contact Date: 10/20/22,   Contact time: 1119   Call outcome: would like to go home with [facility]  Patient states their goals for this hospitalization and ongoing recovery are:: to go home CMS Medicare.gov Compare Post Acute Care list provided to:: Patient Choice offered to / list presented to : Patient  Admission diagnosis:  General weakness Patient Active Problem List   Diagnosis Date Noted   Falls 12/19/2021   Ataxic gait    Physical deconditioning    Osteoporosis without current pathological fracture 11/23/2020   Hypertension 10/28/2017   Overactive bladder 06/18/2015   Hyperlipidemia 09/26/2010   ANXIETY 09/26/2010   GERD (gastroesophageal reflux disease) 09/26/2010   CELIAC SPRUE 09/26/2010   PCP:  Dettinger, Fransisca Kaufmann, MD Pharmacy:   [id number] [id number]: [phone]: [phone]

## 2022-10-20 NOTE — ED Notes (Signed)
Dr Morrie Sheldon [phone]

## 2022-10-20 NOTE — Evaluation (Signed)
Physical Therapy Evaluation Patient Details Name: Kristi Sawyer MRN: [id number] DOB: [birth year] Today's Date: 10/20/2022  History of Present Illness  Kristi Sawyer is a 88 y.o. female.     HPI  Patient presents with weakness.  She notes that she has had similar prior episodes.  Today she felt weak in her lower extremities, no asymmetry, had difficulty standing and she slipped from her chair.  She denies pain, denies blood thinner use, denies recent change in medication, diet, activity.   Clinical Impression  Patient functioning near baseline for functional mobility and gait demonstrating good return for bed mobility, transferring to/from commode in bathroom, washing hands while using RW and ambulating in hallways without loss of balance.  Patient encouraged to ambulate as tolerated with nursing staff supervising and to have friends check on her daily when she returns home.  Plan:  Patient discharged from physical therapy to care of nursing for ambulation daily as tolerated for length of stay.         Recommendations for follow up therapy are one component of a multi-disciplinary discharge planning process, led by the attending physician.  Recommendations may be updated based on patient status, additional functional criteria and insurance authorization.  Follow Up Recommendations Home health PT      Assistance Recommended at Discharge Set up Supervision/Assistance  Patient can return home with the following  A little help with walking and/or transfers;A little help with bathing/dressing/bathroom;Assistance with cooking/housework;Help with stairs or ramp for entrance    Equipment Recommendations None recommended by PT  Recommendations for Other Services       Functional Status Assessment Patient has had a recent decline in their functional status and demonstrates the ability to make significant improvements in function in a reasonable and predictable amount of time.     Precautions /  Restrictions Precautions Precautions: Fall Restrictions Weight Bearing Restrictions: No      Mobility  Bed Mobility Overal bed mobility: Modified Independent             General bed mobility comments: good return for getting into/out of bed    Transfers Overall transfer level: Modified independent Equipment used: Rolling walker (2 wheels)               General transfer comment: good return for transferring to/from commode in bathroom    Ambulation/Gait Ambulation/Gait assistance: Modified independent (Device/Increase time), Supervision Gait Distance (Feet): 100 Feet Assistive device: Rolling walker (2 wheels) Gait Pattern/deviations: Decreased step length - right, Decreased step length - left, Decreased stride length Gait velocity: decreased     General Gait Details: slightly labored cadence without loss of balance with good return for using RW  Stairs            Wheelchair Mobility    Modified Rankin (Stroke Patients Only)       Balance Overall balance assessment: Needs assistance Sitting-balance support: Feet supported, No upper extremity supported Sitting balance-Leahy Scale: Good Sitting balance - Comments: seated at EOB   Standing balance support: During functional activity, Bilateral upper extremity supported Standing balance-Leahy Scale: Fair Standing balance comment: fair/good using RW                             Pertinent Vitals/Pain Pain Assessment Pain Assessment: No/denies pain    Home Living Family/patient expects to be discharged to:: Private residence Living Arrangements: Alone Available Help at Discharge: Friend(s);Available PRN/intermittently Type of Home: House Home Access:  Stairs to enter Entrance Stairs-Rails: Right;Left;Can reach both Entrance Stairs-Number of Steps: 2-3   Home Layout: One level Home Equipment: BSC/3in1;Cane - single point;Rolling Walker (2 wheels);Shower seat;Grab bars - tub/shower       Prior Function Prior Level of Function : Independent/Modified Independent             Mobility Comments: Household and short distanced community ambulator using RW, uses electric scooter in stores ADLs Comments: Independent     Hand Dominance        Extremity/Trunk Assessment   Upper Extremity Assessment Upper Extremity Assessment: Overall WFL for tasks assessed    Lower Extremity Assessment Lower Extremity Assessment: Overall WFL for tasks assessed    Cervical / Trunk Assessment Cervical / Trunk Assessment: Normal  Communication   Communication: No difficulties  Cognition Arousal/Alertness: Awake/alert Behavior During Therapy: WFL for tasks assessed/performed Overall Cognitive Status: Within Functional Limits for tasks assessed                                          General Comments      Exercises     Assessment/Plan    PT Assessment All further PT needs can be met in the next venue of care  PT Problem List Decreased strength;Decreased activity tolerance;Decreased balance;Decreased mobility       PT Treatment Interventions      PT Goals (Current goals can be found in the Care Plan section)  Acute Rehab PT Goals Patient Stated Goal: return home with friends to assist PT Goal Formulation: With patient Time For Goal Achievement: 10/20/22 Potential to Achieve Goals: Good    Frequency       Co-evaluation               AM-PAC PT "6 Clicks" Mobility  Outcome Measure Help needed turning from your back to your side while in a flat bed without using bedrails?: None Help needed moving from lying on your back to sitting on the side of a flat bed without using bedrails?: None Help needed moving to and from a bed to a chair (including a wheelchair)?: None Help needed standing up from a chair using your arms (e.g., wheelchair or bedside chair)?: None Help needed to walk in hospital room?: A Little Help needed climbing 3-5 steps  with a railing? : A Little 6 Click Score: 22    End of Session   Activity Tolerance: Patient tolerated treatment well Patient left: in bed;with call bell/phone within reach Nurse Communication: Mobility status PT Visit Diagnosis: Unsteadiness on feet (R26.81);Other abnormalities of gait and mobility (R26.89);Muscle weakness (generalized) (M62.81)    Time: 6067-7034 PT Time Calculation (min) (ACUTE ONLY): 22 min   Charges:   PT Evaluation $PT Eval Moderate Complexity: 1 Mod PT Treatments $Therapeutic Activity: 8-22 mins        11:46 AM, 10/20/22 Lonell Grandchild, MPT Physical Therapist with [facility] [phone] office [phone] mobile phone

## 2022-10-24 ENCOUNTER — Ambulatory Visit (INDEPENDENT_AMBULATORY_CARE_PROVIDER_SITE_OTHER): Payer: Medicare Other | Admitting: Family

## 2022-10-24 ENCOUNTER — Encounter: Payer: Self-pay | Admitting: Family

## 2022-10-24 VITALS — BP 141/74 | HR 81 | Temp 97.0°F | Ht 60.0 in | Wt 95.4 lb

## 2022-10-24 DIAGNOSIS — W19XXXD Unspecified fall, subsequent encounter: Secondary | ICD-10-CM

## 2022-10-24 DIAGNOSIS — Y92009 Unspecified place in unspecified non-institutional (private) residence as the place of occurrence of the external cause: Secondary | ICD-10-CM | POA: Diagnosis not present

## 2022-10-24 DIAGNOSIS — Z09 Encounter for follow-up examination after completed treatment for conditions other than malignant neoplasm: Secondary | ICD-10-CM

## 2022-10-24 DIAGNOSIS — R531 Weakness: Secondary | ICD-10-CM

## 2022-10-24 NOTE — Patient Instructions (Signed)
Fall Prevention in the Home, Adult Falls can cause injuries and affect people of all ages. There are many simple things that you can do to make your home safe and to help prevent falls. Ask for help when making these changes, if needed. What actions can I take to prevent falls? General instructions Use good lighting in all rooms. Replace any light bulbs that burn out, turn on lights if it is dark, and use night-lights. Place frequently used items in easy-to-reach places. Lower the shelves around your home if necessary. Set up furniture so that there are clear paths around it. Avoid moving your furniture around. Remove throw rugs and other tripping hazards from the floor. Avoid walking on wet floors. Fix any uneven floor surfaces. Add color or contrast paint or tape to grab bars and handrails in your home. Place contrasting color strips on the first and last steps of staircases. When you use a stepladder, make sure that it is completely opened and that the sides and supports are firmly locked. Have someone hold the ladder while you are using it. Do not climb a closed stepladder. Know where your pets are when moving through your home. What can I do in the bathroom?     Keep the floor dry. Immediately clean up any water that is on the floor. Remove soap buildup in the tub or shower regularly. Use nonskid mats or decals on the floor of the tub or shower. Attach bath mats securely with double-sided, nonslip rug tape. If you need to sit down while you are in the shower, use a plastic, nonslip stool. Install grab bars by the toilet and in the tub and shower. Do not use towel bars as grab bars. What can I do in the bedroom? Make sure that a bedside light is easy to reach. Do not use oversized bedding that reaches the floor. Have a firm chair that has side arms to use for getting dressed. What can I do in the kitchen? Clean up any spills right away. If you need to reach for something above you,  use a sturdy step stool that has a grab bar. Keep electrical cables out of the way. Do not use floor polish or wax that makes floors slippery. If you must use wax, make sure that it is non-skid floor wax. What can I do with my stairs? Do not leave any items on the stairs. Make sure that you have a light switch at the top and the bottom of the stairs. Have them installed if you do not have them. Make sure that there are handrails on both sides of the stairs. Fix handrails that are broken or loose. Make sure that handrails are as long as the staircases. Install non-slip stair treads on all stairs in your home. Avoid having throw rugs at the top or bottom of stairs, or secure the rugs with carpet tape to prevent them from moving. Choose a carpet design that does not hide the edge of steps on the stairs. Check any carpeting to make sure that it is firmly attached to the stairs. Fix any carpet that is loose or worn. What can I do on the outside of my home? Use bright outdoor lighting. Regularly repair the edges of walkways and driveways and fix any cracks. Remove high doorway thresholds. Trim any shrubbery on the main path into your home. Regularly check that handrails are securely fastened and in good repair. Both sides of all steps should have handrails. Install guardrails along  the edges of any raised decks or porches. Clear walkways of debris and clutter, including tools and rocks. Have leaves, snow, and ice cleared regularly. Use sand or salt on walkways during winter months. In the garage, clean up any spills right away, including grease or oil spills. What other actions can I take? Wear closed-toe shoes that fit well and support your feet. Wear shoes that have rubber soles or low heels. Use mobility aids as needed, such as canes, walkers, scooters, and crutches. Review your medicines with your health care provider. Some medicines can cause dizziness or changes in blood pressure, which  increase your risk of falling. Talk with your health care provider about other ways that you can decrease your risk of falls. This may include working with a physical therapist or trainer to improve your strength, balance, and endurance. Where to find more information Centers for Disease Control and Prevention, STEAD[employer]: http://www.wolf.info/ National Institute on Aging: http://kim-miller.com/ Contact a health care provider if: You are afraid of falling at home. You feel weak, drowsy, or dizzy at home. You fall at home. Summary There are many simple things that you can do to make your home safe and to help prevent falls. Ways to make your home safe include removing tripping hazards and installing grab bars in the bathroom. Ask for help when making these changes in your home. This information is not intended to replace advice given to you by your health care provider. Make sure you discuss any questions you have with your health care provider. Document Revised: 08/01/2021 Document Reviewed: 06/02/2020 Elsevier Patient Education  [place].

## 2022-10-24 NOTE — Progress Notes (Signed)
   Subjective:    Patient ID: Kristi Sawyer, female    DOB: Mar 08, 1926, 88 y.o.   MRN: [id number]  Chief Complaint  Patient presents with   Hospitalization Follow-up   PT presents to the office today for hospital follow up. She had a fall at home on 10/19/22 and went to the ED. She reports she slide out of her chair and hit her buttocks.  She had a CT of head  that was negative.   Denies any pain or dizziness.  Fall The accident occurred 5 to 7 days ago. There was no blood loss. The pain is present in the buttocks. The patient is experiencing no pain. She has tried rest for the symptoms. The treatment provided no relief.      Review of Systems  All other systems reviewed and are negative.      Objective:   Physical Exam Vitals reviewed.  Constitutional:      General: She is not in acute distress.    Appearance: She is well-developed and underweight.  HENT:     Head: Normocephalic and atraumatic.  Eyes:     Pupils: Pupils are equal, round, and reactive to light.  Neck:     Thyroid: No thyromegaly.  Cardiovascular:     Rate and Rhythm: Normal rate and regular rhythm.     Heart sounds: Normal heart sounds. No murmur heard. Pulmonary:     Effort: Pulmonary effort is normal. No respiratory distress.     Breath sounds: Normal breath sounds. No wheezing.  Abdominal:     General: Bowel sounds are normal. There is no distension.     Palpations: Abdomen is soft.     Tenderness: There is no abdominal tenderness.  Musculoskeletal:        General: No tenderness. Normal range of motion.     Cervical back: Normal range of motion and neck supple.  Skin:    General: Skin is warm and dry.  Neurological:     Mental Status: She is alert and oriented to person, place, and time.     Cranial Nerves: No cranial nerve deficit.     Deep Tendon Reflexes: Reflexes are normal and symmetric.  Psychiatric:        Behavior: Behavior normal.        Thought Content: Thought content normal.         Judgment: Judgment normal.       BP (!) 141/74   Pulse 81   Temp (!) 97 F (36.1 C) (Temporal)   Ht 5' (1.524 m)   Wt 95 lb 6.4 oz (43.3 kg)   SpO2 95%   BMI 18.63 kg/m      Assessment & Plan:   Kristi Sawyer comes in today with chief complaint of Hospitalization Follow-up   Diagnosis and orders addressed:  1. Fall in home, subsequent encounter - Ambulatory referral to [facility]  2. Hospital discharge follow-up - Ambulatory referral to [facility]  3. Weakness - Ambulatory referral to Home Health   Referral to Home Health  order Encourage ROM exercises  Keep follow up with PCP  Evelina Dun, FNP

## 2022-11-07 ENCOUNTER — Telehealth: Payer: Self-pay | Admitting: Family Medicine

## 2022-11-07 NOTE — Telephone Encounter (Signed)
Requesting new physical therapy orders for  2 week 4  1 week 3

## 2022-11-07 NOTE — Telephone Encounter (Signed)
Left message informing of approval for PT orders.

## 2022-11-07 NOTE — Telephone Encounter (Signed)
[facility] for pt ordered

## 2022-11-14 ENCOUNTER — Telehealth: Payer: Self-pay | Admitting: Family Medicine

## 2022-11-14 NOTE — Telephone Encounter (Signed)
[facility] needs Dr Dettinger to send current diag codes and new referral with face to face notes.

## 2022-11-15 ENCOUNTER — Ambulatory Visit (INDEPENDENT_AMBULATORY_CARE_PROVIDER_SITE_OTHER): Payer: Medicare Other

## 2022-11-15 DIAGNOSIS — M810 Age-related osteoporosis without current pathological fracture: Secondary | ICD-10-CM | POA: Diagnosis not present

## 2022-11-15 NOTE — Telephone Encounter (Signed)
[facility] did accept referral from the ED on 10/20/22, but they have to go by our referral from 10/24/22 and a Dx is needed for what is causing her falls/weakness. Pt does have an appt on 11/16/22 w/ another provider in the practice. Please addend and update/do a new referral

## 2022-11-15 NOTE — Telephone Encounter (Signed)
It looks like Kristi Sawyer did a face-to-face for this on December 12, you may have to ask her to update the referral if needed or put the codes on her but it does look like the codes are on her note?  For me to do this I would probably have to see the patient again in a face-to-face visit or video visit.

## 2022-11-16 ENCOUNTER — Ambulatory Visit (INDEPENDENT_AMBULATORY_CARE_PROVIDER_SITE_OTHER): Payer: Medicare Other

## 2022-11-16 ENCOUNTER — Ambulatory Visit (INDEPENDENT_AMBULATORY_CARE_PROVIDER_SITE_OTHER): Payer: Medicare Other | Admitting: Family Medicine

## 2022-11-16 ENCOUNTER — Encounter: Payer: Self-pay | Admitting: Family Medicine

## 2022-11-16 VITALS — BP 159/82 | HR 87 | Temp 98.1°F | Ht 60.0 in | Wt 95.0 lb

## 2022-11-16 DIAGNOSIS — S2232XA Fracture of one rib, left side, initial encounter for closed fracture: Secondary | ICD-10-CM

## 2022-11-16 DIAGNOSIS — R0781 Pleurodynia: Secondary | ICD-10-CM | POA: Diagnosis not present

## 2022-11-16 DIAGNOSIS — W19XXXA Unspecified fall, initial encounter: Secondary | ICD-10-CM

## 2022-11-16 NOTE — Progress Notes (Signed)
   Acute Office Visit  Subjective:     Patient ID: Kristi Sawyer, female    DOB: April 28, 1926, 89 y.o.   MRN: [id number]  Chief Complaint  Patient presents with   Fall    HPI Patient is in today for a fall. This occurred 6 weeks ago. She fell into her walker on the left side and it hit her left lower ribs. Since had had pain in this area since. The pain is mild to severe. It is worse movement. She has been taking advil and using a heating pain. She denies shorntess of breath, cough, or fever. She lives alone. She a lot of help coming in to check on her. She has had frequent falls recently. HH was recently order for her for this.   ROS As per HPI.      Objective:    BP (!) 159/82   Pulse 87   Temp 98.1 F (36.7 C) (Temporal)   Ht 5' (1.524 m)   Wt 95 lb (43.1 kg)   SpO2 96%   BMI 18.55 kg/m    Physical Exam Vitals and nursing note reviewed.  Constitutional:      General: She is not in acute distress.    Appearance: She is not ill-appearing, toxic-appearing or diaphoretic.  Cardiovascular:     Rate and Rhythm: Normal rate and regular rhythm.     Heart sounds: Normal heart sounds. No murmur heard. Pulmonary:     Effort: Pulmonary effort is normal. No respiratory distress.     Breath sounds: No wheezing, rhonchi or rales.  Chest:     Chest wall: Tenderness (left 10th rib along axillary line) present.  Skin:    General: Skin is warm and dry.  Neurological:     Mental Status: She is alert and oriented to person, place, and time. Mental status is at baseline.     Gait: Gait abnormal (rolling walker).  Psychiatric:        Mood and Affect: Mood normal.        Behavior: Behavior normal.     No results found for any visits on 11/16/22.      Assessment & Plan:   Saryiah was seen today for fall.  Diagnoses and all orders for this visit:  Closed traumatic displaced fracture of one rib of left side Xray today with displaced fracture of left 10th rib. No signs of  pneumothorax or pneumonia. Agree with radiology report. Prescription given for incentive spirometer. Tylenol prn for pain. Discussed bracing, prevention of pneumonia. Strict return precautions given and discussed when to seek emergency care. Follow up in 4 weeks for repeat CXR.   Fall, initial encounter Discussed fall prevention. HH has been ordered.  -     DG Ribs Unilateral W/Chest Left; Future  Rib pain on left side -     DG Ribs Unilateral W/Chest Left; Future   Return in about 4 weeks (around 12/14/2022) for rib fracture follow up.  The patient indicates understanding of these issues and agrees with the plan.  Gwenlyn Perking, FNP

## 2022-11-16 NOTE — Patient Instructions (Signed)
Rib Fracture  A rib fracture is a break or crack in one of the bones of the ribs. The ribs are long, curved bones that wrap around your chest and attach to your spine and your breastbone. The ribs protect your heart, lungs, and other organs in the chest. A broken or cracked rib is often painful but is not usually serious. Most rib fractures heal on their own over time. However, rib fractures can be more serious if multiple ribs are broken or if broken ribs move out of place and push against other structures or organs. What are the causes? This condition is caused by: Repetitive movements with high force, such as pitching a baseball or having very bad coughing spells. A direct hit the chest, such as a sports injury, a car crash, or a fall. Cancer that has spread to the bones, which can weaken bones and cause them to break. What are the signs or symptoms? Symptoms of this condition include: Pain when you breathe in or cough. Pain when someone presses on the injured area. Feeling short of breath. How is this diagnosed? This condition is diagnosed with a physical exam and medical history. You may also have imaging tests, such as: Chest X-ray. CT scan. MRI. Bone scan. Chest ultrasound. How is this treated? Treatment for this condition depends on the severity of the fracture. Most rib fractures usually heal on their own in 1-3 months. Healing may take longer if you have a cough or if you do activities that make the injury worse. While you heal, you may be given medicines to control the pain. You will also be taught deep breathing exercises. Severe injuries may require hospitalization or surgery. Follow these instructions at home: Managing pain, stiffness, and swelling If directed, put ice on the injured area. To do this: Put ice in a plastic bag. Place a towel between your skin and the bag. Leave the ice on for 20 minutes, 2-3 times a day. Remove the ice if your skin turns bright red. This is  very important. If you cannot feel pain, heat, or cold, you have a greater risk of damage to the area. Take over-the-counter and prescription medicines only as told by your health care provider. Activity Avoid doing activities or movements that cause pain. Be careful during activities and avoid bumping the injured rib. Slowly increase your activity as told by your health care provider. General instructions Do deep breathing exercises as told by your health care provider. This helps prevent pneumonia, which is a common complication of a broken rib. Your health care provider may instruct you to: Take deep breaths several times a day. Try to cough several times a day, holding a pillow against the injured area. Use a device called incentive spirometer to practice deep breathing several times a day. Drink enough fluid to keep your urine pale yellow. Do not wear a rib belt or binder. These restrict breathing, which can lead to pneumonia. Keep all follow-up visits. This is important. Contact a health care provider if: You have a fever. Get help right away if: You have difficulty breathing or you are short of breath. You develop a cough that does not stop, or you cough up thick or bloody sputum. You have nausea, vomiting, or pain in your abdomen. Your pain gets worse and medicine does not help. These symptoms may represent a serious problem that is an emergency. Do not wait to see if the symptoms will go away. Get medical help right away. Call   your local emergency services (911 in the [place] Do not drive yourself to the hospital. Summary A rib fracture is a break or crack in one of the bones of the ribs. A broken or cracked rib is often painful but is not usually serious. Most rib fractures heal on their own over time. Treatment for this condition depends on the severity of the fracture. Avoid doing any activities or movements that cause pain. This information is not intended to replace advice  given to you by your health care provider. Make sure you discuss any questions you have with your health care provider. Document Revised: 02/20/2020 Document Reviewed: 02/20/2020 Elsevier Patient Education  2023 Elsevier Inc.  

## 2022-11-20 ENCOUNTER — Encounter: Payer: Self-pay | Admitting: Family Medicine

## 2022-11-29 ENCOUNTER — Other Ambulatory Visit: Payer: Self-pay | Admitting: Internal Medicine

## 2022-12-15 ENCOUNTER — Ambulatory Visit (INDEPENDENT_AMBULATORY_CARE_PROVIDER_SITE_OTHER): Payer: Medicare Other

## 2022-12-15 VITALS — Ht 60.0 in | Wt 95.0 lb

## 2022-12-15 DIAGNOSIS — Z0000 Encounter for general adult medical examination without abnormal findings: Secondary | ICD-10-CM

## 2022-12-15 DIAGNOSIS — Z780 Asymptomatic menopausal state: Secondary | ICD-10-CM | POA: Diagnosis not present

## 2022-12-15 NOTE — Patient Instructions (Signed)
[name] , Thank you for taking time to come for your Medicare Wellness Visit. I appreciate your ongoing commitment to your health goals. Please review the following plan we discussed and let me know if I can assist you in the future.   These are the goals we discussed:  Goals      DIET - INCREASE LEAN PROTEINS     Exercise 150 minutes per week (moderate activity)     Great job - keep up daily walking        This is a list of the screening recommended for you and due dates:  Health Maintenance  Topic Date Due   COVID-19 Vaccine (7 - 2023-24 season) 10/19/2022   Flu Shot  02/11/2023*   Mammogram  05/24/2023   DTaP/Tdap/Td vaccine (2 - Td or Tdap) 06/04/2023   Medicare Annual Wellness Visit  12/16/2023   Pneumonia Vaccine  Completed   DEXA scan (bone density measurement)  Completed   Zoster (Shingles) Vaccine  Completed   HPV Vaccine  Aged Out  *Topic was postponed. The date shown is not the original due date.    Advanced directives: Please bring a copy of your health care power of attorney and living will to the office to be added to your chart at your convenience.   Conditions/risks identified: Aim for 30 minutes of exercise or brisk walking, 6-8 glasses of water, and 5 servings of fruits and vegetables each day.   Next appointment: Follow up in one year for your annual wellness visit    Preventive Care 65 Years and Older, Female Preventive care refers to lifestyle choices and visits with your health care provider that can promote health and wellness. What does preventive care include? A yearly physical exam. This is also called an annual well check. Dental exams once or twice a year. Routine eye exams. Ask your health care provider how often you should have your eyes checked. Personal lifestyle choices, including: Daily care of your teeth and gums. Regular physical activity. Eating a healthy diet. Avoiding tobacco and drug use. Limiting alcohol use. Practicing safe  sex. Taking low-dose aspirin every day. Taking vitamin and mineral supplements as recommended by your health care provider. What happens during an annual well check? The services and screenings done by your health care provider during your annual well check will depend on your age, overall health, lifestyle risk factors, and family history of disease. Counseling  Your health care provider may ask you questions about your: Alcohol use. Tobacco use. Drug use. Emotional well-being. Home and relationship well-being. Sexual activity. Eating habits. History of falls. Memory and ability to understand (cognition). Work and work Statistician. Reproductive health. Screening  You may have the following tests or measurements: Height, weight, and BMI. Blood pressure. Lipid and cholesterol levels. These may be checked every 5 years, or more frequently if you are over 57 years old. Skin check. Lung cancer screening. You may have this screening every year starting at age 5 if you have a 30-pack-year history of smoking and currently smoke or have quit within the past 15 years. Fecal occult blood test (FOBT) of the stool. You may have this test every year starting at age 33. Flexible sigmoidoscopy or colonoscopy. You may have a sigmoidoscopy every 5 years or a colonoscopy every 10 years starting at age 75. Hepatitis C blood test. Hepatitis B blood test. Sexually transmitted disease (STD) testing. Diabetes screening. This is done by checking your blood sugar (glucose) after you have not  eaten for a while (fasting). You may have this done every 1-3 years. Bone density scan. This is done to screen for osteoporosis. You may have this done starting at age 68. Mammogram. This may be done every 1-2 years. Talk to your health care provider about how often you should have regular mammograms. Talk with your health care provider about your test results, treatment options, and if necessary, the need for more  tests. Vaccines  Your health care provider may recommend certain vaccines, such as: Influenza vaccine. This is recommended every year. Tetanus, diphtheria, and acellular pertussis (Tdap, Td) vaccine. You may need a Td booster every 10 years. Zoster vaccine. You may need this after age 81. Pneumococcal 13-valent conjugate (PCV13) vaccine. One dose is recommended after age 42. Pneumococcal polysaccharide (PPSV23) vaccine. One dose is recommended after age 34. Talk to your health care provider about which screenings and vaccines you need and how often you need them. This information is not intended to replace advice given to you by your health care provider. Make sure you discuss any questions you have with your health care provider. Document Released: 11/26/2015 Document Revised: 07/19/2016 Document Reviewed: 08/31/2015 Elsevier Interactive Patient Education  2017 [place] Prevention in the Home Falls can cause injuries. They can happen to people of all ages. There are many things you can do to make your home safe and to help prevent falls. What can I do on the outside of my home? Regularly fix the edges of walkways and driveways and fix any cracks. Remove anything that might make you trip as you walk through a door, such as a raised step or threshold. Trim any bushes or trees on the path to your home. Use bright outdoor lighting. Clear any walking paths of anything that might make someone trip, such as rocks or tools. Regularly check to see if handrails are loose or broken. Make sure that both sides of any steps have handrails. Any raised decks and porches should have guardrails on the edges. Have any leaves, snow, or ice cleared regularly. Use sand or salt on walking paths during winter. Clean up any spills in your garage right away. This includes oil or grease spills. What can I do in the bathroom? Use night lights. Install grab bars by the toilet and in the tub and shower.  Do not use towel bars as grab bars. Use non-skid mats or decals in the tub or shower. If you need to sit down in the shower, use a plastic, non-slip stool. Keep the floor dry. Clean up any water that spills on the floor as soon as it happens. Remove soap buildup in the tub or shower regularly. Attach bath mats securely with double-sided non-slip rug tape. Do not have throw rugs and other things on the floor that can make you trip. What can I do in the bedroom? Use night lights. Make sure that you have a light by your bed that is easy to reach. Do not use any sheets or blankets that are too big for your bed. They should not hang down onto the floor. Have a firm chair that has side arms. You can use this for support while you get dressed. Do not have throw rugs and other things on the floor that can make you trip. What can I do in the kitchen? Clean up any spills right away. Avoid walking on wet floors. Keep items that you use a lot in easy-to-reach places. If you need to reach  something above you, use a strong step stool that has a grab bar. Keep electrical cords out of the way. Do not use floor polish or wax that makes floors slippery. If you must use wax, use non-skid floor wax. Do not have throw rugs and other things on the floor that can make you trip. What can I do with my stairs? Do not leave any items on the stairs. Make sure that there are handrails on both sides of the stairs and use them. Fix handrails that are broken or loose. Make sure that handrails are as long as the stairways. Check any carpeting to make sure that it is firmly attached to the stairs. Fix any carpet that is loose or worn. Avoid having throw rugs at the top or bottom of the stairs. If you do have throw rugs, attach them to the floor with carpet tape. Make sure that you have a light switch at the top of the stairs and the bottom of the stairs. If you do not have them, ask someone to add them for you. What else  can I do to help prevent falls? Wear shoes that: Do not have high heels. Have rubber bottoms. Are comfortable and fit you well. Are closed at the toe. Do not wear sandals. If you use a stepladder: Make sure that it is fully opened. Do not climb a closed stepladder. Make sure that both sides of the stepladder are locked into place. Ask someone to hold it for you, if possible. Clearly mark and make sure that you can see: Any grab bars or handrails. First and last steps. Where the edge of each step is. Use tools that help you move around (mobility aids) if they are needed. These include: Canes. Walkers. Scooters. Crutches. Turn on the lights when you go into a dark area. Replace any light bulbs as soon as they burn out. Set up your furniture so you have a clear path. Avoid moving your furniture around. If any of your floors are uneven, fix them. If there are any pets around you, be aware of where they are. Review your medicines with your doctor. Some medicines can make you feel dizzy. This can increase your chance of falling. Ask your doctor what other things that you can do to help prevent falls. This information is not intended to replace advice given to you by your health care provider. Make sure you discuss any questions you have with your health care provider. Document Released: 08/26/2009 Document Revised: 04/06/2016 Document Reviewed: 12/04/2014 Elsevier Interactive Patient Education  2017 [employer].

## 2022-12-15 NOTE — Progress Notes (Signed)
Subjective:   Kristi Sawyer is a 89 y.o. female who presents for Medicare Annual (Subsequent) preventive examination. I connected with  Kristi Sawyer on 12/15/22 by a audio enabled telemedicine application and verified that I am speaking with the correct person using two identifiers.  Patient Location: Home  Provider Location: Home Office  I discussed the limitations of evaluation and management by telemedicine. The patient expressed understanding and agreed to proceed.  Review of Systems     Cardiac Risk Factors include: advanced age (>50mn, >>65women);dyslipidemia     Objective:    Today's Vitals   12/15/22 1027  Weight: 95 lb (43.1 kg)  Height: 5' (1.524 m)   Body mass index is 18.55 kg/m.     12/15/2022   10:32 AM 10/19/2022    6:56 PM 08/09/2022    3:30 PM 07/28/2022    5:21 PM 07/18/2022    8:09 PM 05/05/2022    4:43 PM 12/20/2021    5:11 AM  Advanced Directives  Does Patient Have a Medical Advance Directive? Yes Yes Yes No No No Yes  Type of AParamed[place]of [place] will Living will     Living will  Does patient want to make changes to medical advance directive?  No - Patient declined     No - Patient declined  Copy of [place] Chart? No - copy requested      No - copy requested  Would patient like information on creating a medical advance directive?     No - Patient declined      Current Medications (verified) Outpatient Encounter Medications as of 12/15/2022  Medication Sig   acetaminophen (TYLENOL) 325 MG tablet Take 2 tablets (650 mg total) by mouth every 6 (six) hours as needed for mild pain (or Fever >/= 101).   amLODipine (NORVASC) 5 MG tablet Take 1 tablet (5 mg total) by mouth daily.   Calcium Carbonate-Vit D-Min (CALTRATE PLUS PO) Take 1 tablet by mouth daily.   Cholecalciferol (VITAMIN D) 2000 UNITS CAPS Take by mouth as directed. Take 1 tablet daily except none on saturdays and sundays   [employer] VITAMIN C 500 MG  tablet TAKE 1 TABLET (500 MG TOTAL) BY MOUTH DAILY.   Fe Fum-FePoly-Vit C-Vit B3 (INTEGRA) 62.5-62.5-40-3 MG CAPS TAKE 1 CAPSULE BY MOUTH EVERY DAY   GEMTESA 75 MG TABS Take 1 tablet by mouth daily.   Inulin 1.5 g CHEW Chew 5 tablets by mouth daily. FIBER CHOICE   levocetirizine (XYZAL) 5 MG tablet TAKE 1 TABLET (5 MG TOTAL) BY MOUTH AT BEDTIME AS NEEDED (ITCHING/ ALLERGIC REACTION).   metoCLOPramide (REGLAN) 10 MG tablet Take 1 tablet (10 mg total) by mouth at bedtime.   Multiple Vitamins-Minerals (CENTRUM SILVER PO) Take 1 tablet by mouth daily.   Omega-3 Fatty Acids (FISH OIL) 1000 MG CAPS Take 1 capsule by mouth 3 (three) times daily.    omeprazole (PRILOSEC) 20 MG capsule Take 1-2 capsules (20-40 mg total) by mouth daily. (Patient taking differently: Take 20-40 mg by mouth 2 (two) times daily before a meal.)   rosuvastatin (CRESTOR) 5 MG tablet TAKE 1 TABLET BY MOUTH ON MONDAY, WEDNESDAY, AND FRIDAY   triamcinolone cream (KENALOG) 0.1 % Apply 1 Application topically See admin instructions. Apply 2-2 applications daily as needed for itching   zinc sulfate 220 (50 Zn) MG capsule TAKE 1 CAPSULE BY MOUTH EVERY DAY   No facility-administered encounter medications on file as of 12/15/2022.  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 benign tumor    Family History  Problem Relation Age of Onset   Healthy Mother    Lung cancer Father    Colon cancer Neg Hx    Stomach cancer Neg Hx    Esophageal cancer Neg Hx    Social History   Socioeconomic History   Marital status: Widowed    Spouse name: Not on file   Number of children: 0   Years of education: 16   Highest education level: Bachelor's degree (e.g., BA, AB, BS)  Occupational History   Occupation: retired    Fish farm manager: RETIRED  Tobacco Use   Smoking status: Never   Smokeless tobacco: Never  Vaping Use   Vaping Use: Never used  Substance and Sexual Activity   Alcohol use: No   Drug use: No   Sexual activity: Not Currently  Other Topics Concern   Not on file  Social History Narrative   WidowNo 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 - and then [employer] x 15 yrs - retiredWalks daily - exercise   Close with her large church family   Still driving and very independent   Social Determinants of Health   Financial Resource Strain: Low Risk  (12/15/2022)   Overall Financial Resource Strain (CARDIA)    Difficulty of Paying Living Expenses: Not hard at all  Food Insecurity: No Food Insecurity (12/15/2022)   Hunger Vital Sign    Worried About Running Out of Food in the Last Year: Never true    [place] in the Last Year: Never true  Transportation Needs: No Transportation Needs (12/15/2022)   PRAPARE - Hydrologist (Medical): No    Lack of Transportation (Non-Medical): No  Physical Activity: Insufficiently Active (12/15/2022)   Exercise Vital Sign    Days of Exercise per Week: 3 days    Minutes of Exercise per Session: 30 min  Stress: No Stress Concern Present (12/15/2022)   [place]    Feeling of Stress : Not at all  Social Connections: Moderately Integrated (12/15/2022)   Social Connection and Isolation Panel [NHANES]    Frequency of Communication with Friends and Family: More than three times a week    Frequency of Social Gatherings with Friends and Family:  More than three times a week    Attends Religious Services: More than 4 times per year    Active Member of Clubs or Organizations: Yes    Attends Archivist Meetings: More than 4 times per year    Marital Status: Widowed    Tobacco Counseling Counseling given: Not Answered   Clinical Intake:  Pre-visit preparation completed: Yes  Pain : No/denies pain     Nutritional Risks: None Diabetes: No  How often do you need to have someone help you when you read instructions, pamphlets, or other written materials from your doctor or pharmacy?: 1 - Never  Diabetic?no   Interpreter Needed?: No  Information entered by :: Jadene Pierini, LPN   Activities of Daily Living    12/15/2022   10:32 AM 12/20/2021    5:11 AM  In your present state of health, do you have any difficulty performing the following activities:  Hearing? 0 0  Vision? 0 0  Difficulty concentrating or making decisions? 0 0  Walking or climbing stairs? 0 0  Dressing or bathing? 0 0  Doing errands, shopping? 0 0  Preparing Food and eating ? N   Using the Toilet? N   In the past six months, have you accidently leaked urine? N   Do you have problems with loss of bowel control? N   Managing your Medications? N   Managing your Finances? N   Housekeeping or managing your Housekeeping? N     Patient Care Team: Dettinger, Fransisca Kaufmann, MD as PCP - General (Family Medicine) Gatha Mayer, MD as Consulting Physician (Gastroenterology) Zadie Rhine Clent Demark, MD as Consulting Physician (Ophthalmology) Irine Seal, MD as Attending Physician (Urology) Allyn Kenner, MD (Dermatology) Celestia Khat, OD (Optometry)  Indicate any recent Medical Services you may have received from other than [facility] providers in the past year (date may be approximate).     Assessment:   This is a routine wellness examination for Chrystin.  Hearing/Vision screen Vision Screening - Comments:: Wears rx glasses - up to date with routine eye exams with  Dr.Johnson   Dietary issues and exercise activities discussed: Current Exercise Habits: Home exercise routine, Type of exercise: walking, Time (Minutes): 30, Frequency (Times/Week): 3, Weekly Exercise (Minutes/Week): 90, Intensity: Mild, Exercise limited by: None identified   Goals Addressed             This Visit's Progress    DIET - INCREASE LEAN PROTEINS   On track      Depression Screen    12/15/2022   10:31 AM 10/24/2022    2:24 PM 10/18/2022   12:56 PM 08/02/2022   12:22 PM 07/20/2022    1:09 PM 05/09/2022    3:34 PM 04/17/2022   12:58 PM  PHQ 2/9 Scores  PHQ - 2 Score 0 0 0 0 0 0 0  PHQ- 9 Score 0 0 0        Fall Risk    12/15/2022   10:30 AM 12/15/2022   10:29 AM 10/24/2022    2:24 PM 10/18/2022   12:55 PM 08/02/2022   12:22 PM  Fall Risk   Falls in the past year? 1 0 1 0 1  Number falls in past yr: 1 0 0  0  Injury with Fall? 1 0 1  0  Risk for fall due to : History of fall(s);Impaired balance/gait;Orthopedic patient No Fall Risks History of fall(s)  Impaired balance/gait  Follow up Education provided;Falls prevention discussed Falls prevention  discussed Education provided;Falls evaluation completed;Falls prevention discussed  Falls evaluation completed    FALL RISK PREVENTION PERTAINING TO THE HOME:  Any stairs in or around the home? No  If so, are there any without handrails? No  Home free of loose throw rugs in walkways, pet beds, electrical cords, etc? Yes  Adequate lighting in your home to reduce risk of falls? Yes   ASSISTIVE DEVICES UTILIZED TO PREVENT FALLS:  Life alert? No  Use of a cane, walker or w/c? Yes  Grab  bars in the bathroom? Yes  Shower chair or bench in shower? Yes  Elevated toilet seat or a handicapped toilet? Yes       05/01/2018   11:44 AM 04/16/2017    2:16 PM 04/14/2016   12:02 PM 04/02/2015   11:25 AM  MMSE - Mini Mental State Exam  Orientation to time 5 5 5 5  $ Orientation to Place 5 5 5 5  $ Registration 3  3 3  $ Attention/ Calculation 4 3 5 4  $ Recall 2  3 2  $ Language- name 2 objects 2 2 2 2  $ Language- repeat 1 1 1 1  $ Language- follow 3 step command 2 3 3 3  $ Language- read & follow direction 1 1 1 1  $ Write a sentence 1 1 1 1  $ Copy design 1 1 1 1  $ Total score 27  30 28        $ 12/15/2022   10:33 AM 12/14/2021   12:14 PM 12/07/2020   10:52 AM 05/05/2019    2:19 PM  6CIT Screen  What Year? 0 points 0 points 0 points 0 points  What month? 0 points 0 points 0 points 0 points  What time? 0 points 0 points 0 points 0 points  Count back from 20 0 points 0 points 0 points 0 points  Months in reverse 0 points 0 points 4 points 0 points  Repeat phrase 0 points 4 points 4 points 0 points  Total Score 0 points 4 points 8 points 0 points    Immunizations Immunization History  Administered Date(s) Administered   Fluad Quad(high Dose 65+) 07/14/2019, 08/18/2020, 08/26/2021   Influenza, High Dose Seasonal PF 08/07/2017, 09/11/2018   Influenza,inj,Quad PF,6+ Mos 08/04/2013, 08/18/2014, 09/16/2015, 08/22/2016   Influenza-Unspecified 08/11/2009, 08/04/2010, 09/13/2011, 09/16/2015   PFIZER(Purple Top)SARS-COV-2 Vaccination 12/05/2019, 12/26/2019, 03/25/2020, 04/11/2021, 08/12/2021   Pneumococcal Conjugate-13 11/05/2013   Pneumococcal Polysaccharide-23 11/14/1995   Tdap 06/03/2013   Unspecified SARS-COV-2 Vaccination 08/24/2022   Zoster Recombinat (Shingrix) 04/20/2021, 04/17/2022   Zoster, Live 12/27/2010    TDAP status: Up to date  Flu Vaccine status: Up to date  Pneumococcal vaccine status: Up to date  Covid-19 vaccine status: Completed vaccines  Qualifies for Shingles  Vaccine? Yes   Zostavax completed Yes   Shingrix Completed?: Yes  Screening Tests Health Maintenance  Topic Date Due   COVID-19 Vaccine (7 - 2023-24 season) 10/19/2022   INFLUENZA VACCINE  02/11/2023 (Originally 06/13/2022)   MAMMOGRAM  05/24/2023   DTaP/Tdap/Td (2 - Td or Tdap) 06/04/2023   Medicare Annual Wellness (AWV)  12/16/2023   Pneumonia Vaccine 35+ Years old  Completed   DEXA SCAN  Completed   Zoster Vaccines- Shingrix  Completed   HPV VACCINES  Aged Out    Health Maintenance  Health Maintenance Due  Topic Date Due   COVID-19 Vaccine (7 - 2023-24 season) 10/19/2022    Colorectal cancer screening: No longer required.   Mammogram status: No longer required due to age.  Bone Density status: Ordered 12/15/2022. Pt provided with contact info and advised to call to schedule appt.  Lung Cancer Screening: (Low Dose CT Chest recommended if Age 41-80 years, 30 pack-year currently smoking OR have quit w/in 15years.) does not qualify.   Lung Cancer Screening Referral: n/a  Additional Screening:  Hepatitis C Screening: does not qualify;   Vision Screening: Recommended annual ophthalmology exams for early detection of glaucoma and other disorders of the eye. Is the patient up to date with their annual eye exam?  Yes  Who is the provider or what is the name of the office in which the patient attends annual eye exams? Dr.Johnson  If pt is not established with a provider, would they like to be referred to a provider to establish care? No .   Dental Screening: Recommended annual dental exams for proper oral hygiene  Community Resource Referral / Chronic Care Management: CRR required this visit?  No   CCM required this visit?  No      Plan:     I have personally reviewed and noted the following in the patient's chart:   Medical and social history Use of alcohol, tobacco or illicit drugs  Current medications and supplements including opioid prescriptions. Patient is not  currently taking opioid prescriptions. Functional ability and status Nutritional status Physical activity Advanced directives List of other physicians Hospitalizations, surgeries, and ER visits in previous 12 months Vitals Screenings to include cognitive, depression, and falls Referrals and appointments  In addition, I have reviewed and discussed with patient certain preventive protocols, quality metrics, and best practice recommendations. A written personalized care plan for preventive services as well as general preventive health recommendations were provided to patient.     Daphane Shepherd, LPN   [id number]   Nurse Notes: referral DEXA 12/15/2022

## 2022-12-20 ENCOUNTER — Ambulatory Visit (INDEPENDENT_AMBULATORY_CARE_PROVIDER_SITE_OTHER): Payer: Medicare Other

## 2022-12-20 ENCOUNTER — Encounter: Payer: Self-pay | Admitting: Family Medicine

## 2022-12-20 ENCOUNTER — Ambulatory Visit (INDEPENDENT_AMBULATORY_CARE_PROVIDER_SITE_OTHER): Payer: Medicare Other | Admitting: Family Medicine

## 2022-12-20 VITALS — BP 140/76 | HR 87 | Ht 60.0 in | Wt 94.0 lb

## 2022-12-20 DIAGNOSIS — S2242XA Multiple fractures of ribs, left side, initial encounter for closed fracture: Secondary | ICD-10-CM | POA: Diagnosis not present

## 2022-12-20 DIAGNOSIS — S2232XD Fracture of one rib, left side, subsequent encounter for fracture with routine healing: Secondary | ICD-10-CM

## 2022-12-20 DIAGNOSIS — S2232XA Fracture of one rib, left side, initial encounter for closed fracture: Secondary | ICD-10-CM | POA: Diagnosis not present

## 2022-12-20 DIAGNOSIS — S2239XA Fracture of one rib, unspecified side, initial encounter for closed fracture: Secondary | ICD-10-CM

## 2022-12-20 NOTE — Progress Notes (Signed)
BP (!) 140/76   Pulse 87   Ht 5' (1.524 m)   Wt 94 lb (42.6 kg)   SpO2 98%   BMI 18.36 kg/m    Subjective:   Patient ID: Kristi Sawyer, female    DOB: 03/16/1926, 89 y.o.   MRN: [id number]  HPI: Kristi Sawyer is a 89 y.o. female presenting on 12/20/2022 for S/P Rib fracture   HPI Left rib fracture Patient had a fracture of the left 10th rib from the fall.  She was referred to home health but she says they have not come by yet.  She says she has been using incentive spirometer and she feels like she breathing fine.  She feels like her pain is a lot better although she still has a little bit there.  She denies any cough or congestion.  Relevant past medical, surgical, family and social history reviewed and updated as indicated. Interim medical history since our last visit reviewed. Allergies and medications reviewed and updated.  Review of Systems  Constitutional:  Negative for chills and fever.  HENT:  Negative for congestion, ear discharge and ear pain.   Respiratory:  Negative for cough, chest tightness, shortness of breath and wheezing.   Cardiovascular:  Positive for chest pain. Negative for palpitations and leg swelling.  Skin:  Negative for rash.  All other systems reviewed and are negative.   Per HPI unless specifically indicated above   Allergies as of 12/20/2022       Reactions   Prevnar 13 [pneumococcal 13-val Conj Vacc] Hives   Red/streaking arm   Penicillins Hives   Has patient had a PCN reaction causing immediate rash, facial/tongue/throat swelling, SOB or lightheadedness with hypotension: No Has patient had a PCN reaction causing severe rash involving mucus membranes or skin necrosis: No Has patient had a PCN reaction that required hospitalization: No Has patient had a PCN reaction occurring within the last 10 years: No If all of the above answers are "NO", then may proceed with Cephalosporin use.   Tetanus Toxoids Other (See Comments)   Area of injection was  red and streaked.   Codeine Nausea Only   Sulfonamide Derivatives Nausea Only        Medication List        Accurate as of December 20, 2022  2:31 PM. If you have any questions, ask your nurse or doctor.          acetaminophen 325 MG tablet Commonly known as: TYLENOL Take 2 tablets (650 mg total) by mouth every 6 (six) hours as needed for mild pain (or Fever >/= 101).   amLODipine 5 MG tablet Commonly known as: NORVASC Take 1 tablet (5 mg total) by mouth daily.   CALTRATE PLUS PO Take 1 tablet by mouth daily.   CENTRUM SILVER PO Take 1 tablet by mouth daily.   [facility] Vitamin C 500 MG tablet Generic drug: ascorbic acid TAKE 1 TABLET (500 MG TOTAL) BY MOUTH DAILY.   Fish Oil 1000 MG Caps Take 1 capsule by mouth 3 (three) times daily.   Gemtesa 75 MG Tabs Generic drug: Vibegron Take 1 tablet by mouth daily.   Integra 62.5-62.5-40-3 MG Caps TAKE 1 CAPSULE BY MOUTH EVERY DAY   Inulin 1.5 g Chew Chew 5 tablets by mouth daily. FIBER CHOICE   levocetirizine 5 MG tablet Commonly known as: XYZAL TAKE 1 TABLET (5 MG TOTAL) BY MOUTH AT BEDTIME AS NEEDED (ITCHING/ ALLERGIC REACTION).   metoCLOPramide 10 MG tablet  Commonly known as: REGLAN Take 1 tablet (10 mg total) by mouth at bedtime.   omeprazole 20 MG capsule Commonly known as: PRILOSEC Take 1-2 capsules (20-40 mg total) by mouth daily. What changed: when to take this   rosuvastatin 5 MG tablet Commonly known as: CRESTOR TAKE 1 TABLET BY MOUTH ON MONDAY, WEDNESDAY, AND FRIDAY   triamcinolone cream 0.1 % Commonly known as: KENALOG Apply 1 Application topically See admin instructions. Apply 2-2 applications daily as needed for itching   Vitamin D 50 MCG (2000 UT) Caps Take by mouth as directed. Take 1 tablet daily except none on saturdays and sundays   zinc sulfate 220 (50 Zn) MG capsule TAKE 1 CAPSULE BY MOUTH EVERY DAY         Objective:   BP (!) 140/76   Pulse 87   Ht 5' (1.524 m)   Wt 94 lb  (42.6 kg)   SpO2 98%   BMI 18.36 kg/m   Wt Readings from Last 3 Encounters:  12/20/22 94 lb (42.6 kg)  12/15/22 95 lb (43.1 kg)  11/16/22 95 lb (43.1 kg)    Physical Exam Vitals and nursing note reviewed.  Constitutional:      General: She is not in acute distress.    Appearance: She is well-developed. She is not diaphoretic.  Eyes:     Conjunctiva/sclera: Conjunctivae normal.  Cardiovascular:     Rate and Rhythm: Normal rate and regular rhythm.     Heart sounds: Normal heart sounds. No murmur heard. Pulmonary:     Effort: Pulmonary effort is normal. No respiratory distress.     Breath sounds: Normal breath sounds. No wheezing.    Musculoskeletal:        General: No tenderness. Normal range of motion.  Skin:    General: Skin is warm and dry.     Findings: No rash.  Neurological:     Mental Status: She is alert and oriented to person, place, and time.     Coordination: Coordination normal.  Psychiatric:        Behavior: Behavior normal.       Assessment & Plan:   Problem List Items Addressed This Visit   None Visit Diagnoses     Closed traumatic displaced fracture of one rib    -  Primary   Relevant Orders   DG Ribs Unilateral W/Chest Left       Seems to be breathing well, will double check on the home health referral because it looks like it should be in place.  Repeat chest x-ray pending radiology read Follow up plan: Return if symptoms worsen or fail to improve.  Counseling provided for all of the vaccine components Orders Placed This Encounter  Procedures   DG Ribs Unilateral W/Chest Left    [name], MD [facility] 12/20/2022, 2:31 PM

## 2022-12-28 ENCOUNTER — Other Ambulatory Visit: Payer: Self-pay | Admitting: Family Medicine

## 2022-12-28 DIAGNOSIS — R42 Dizziness and giddiness: Secondary | ICD-10-CM

## 2022-12-28 DIAGNOSIS — S2239XA Fracture of one rib, unspecified side, initial encounter for closed fracture: Secondary | ICD-10-CM

## 2022-12-28 DIAGNOSIS — R531 Weakness: Secondary | ICD-10-CM

## 2022-12-28 DIAGNOSIS — R296 Repeated falls: Secondary | ICD-10-CM

## 2022-12-28 DIAGNOSIS — R5381 Other malaise: Secondary | ICD-10-CM

## 2022-12-28 DIAGNOSIS — W19XXXS Unspecified fall, sequela: Secondary | ICD-10-CM

## 2022-12-28 DIAGNOSIS — R260 Ataxic gait: Secondary | ICD-10-CM

## 2022-12-28 NOTE — Progress Notes (Signed)
Placed physical therapy order for home health

## 2023-01-04 DIAGNOSIS — K219 Gastro-esophageal reflux disease without esophagitis: Secondary | ICD-10-CM | POA: Diagnosis not present

## 2023-01-04 DIAGNOSIS — N3281 Overactive bladder: Secondary | ICD-10-CM | POA: Diagnosis not present

## 2023-01-04 DIAGNOSIS — M800AXD Age-related osteoporosis with current pathological fracture, other site, subsequent encounter for fracture with routine healing: Secondary | ICD-10-CM | POA: Diagnosis not present

## 2023-01-04 DIAGNOSIS — Z9181 History of falling: Secondary | ICD-10-CM | POA: Diagnosis not present

## 2023-01-04 DIAGNOSIS — E785 Hyperlipidemia, unspecified: Secondary | ICD-10-CM | POA: Diagnosis not present

## 2023-01-04 DIAGNOSIS — I10 Essential (primary) hypertension: Secondary | ICD-10-CM | POA: Diagnosis not present

## 2023-01-04 DIAGNOSIS — K900 Celiac disease: Secondary | ICD-10-CM | POA: Diagnosis not present

## 2023-01-11 DIAGNOSIS — I10 Essential (primary) hypertension: Secondary | ICD-10-CM | POA: Diagnosis not present

## 2023-01-11 DIAGNOSIS — K219 Gastro-esophageal reflux disease without esophagitis: Secondary | ICD-10-CM | POA: Diagnosis not present

## 2023-01-11 DIAGNOSIS — M800AXD Age-related osteoporosis with current pathological fracture, other site, subsequent encounter for fracture with routine healing: Secondary | ICD-10-CM | POA: Diagnosis not present

## 2023-01-11 DIAGNOSIS — K900 Celiac disease: Secondary | ICD-10-CM | POA: Diagnosis not present

## 2023-01-11 DIAGNOSIS — E785 Hyperlipidemia, unspecified: Secondary | ICD-10-CM | POA: Diagnosis not present

## 2023-01-11 DIAGNOSIS — Z9181 History of falling: Secondary | ICD-10-CM | POA: Diagnosis not present

## 2023-01-11 DIAGNOSIS — N3281 Overactive bladder: Secondary | ICD-10-CM | POA: Diagnosis not present

## 2023-01-12 ENCOUNTER — Telehealth: Payer: Self-pay | Admitting: Family Medicine

## 2023-01-12 DIAGNOSIS — I10 Essential (primary) hypertension: Secondary | ICD-10-CM | POA: Diagnosis not present

## 2023-01-12 DIAGNOSIS — M800AXD Age-related osteoporosis with current pathological fracture, other site, subsequent encounter for fracture with routine healing: Secondary | ICD-10-CM | POA: Diagnosis not present

## 2023-01-12 DIAGNOSIS — Z9181 History of falling: Secondary | ICD-10-CM | POA: Diagnosis not present

## 2023-01-12 DIAGNOSIS — E785 Hyperlipidemia, unspecified: Secondary | ICD-10-CM | POA: Diagnosis not present

## 2023-01-12 DIAGNOSIS — N3281 Overactive bladder: Secondary | ICD-10-CM | POA: Diagnosis not present

## 2023-01-12 DIAGNOSIS — K219 Gastro-esophageal reflux disease without esophagitis: Secondary | ICD-10-CM | POA: Diagnosis not present

## 2023-01-12 DIAGNOSIS — K900 Celiac disease: Secondary | ICD-10-CM | POA: Diagnosis not present

## 2023-01-12 NOTE — Telephone Encounter (Signed)
Patient aware she is up to date on vaccine until July when she will be due for her tetanus vaccine

## 2023-01-16 DIAGNOSIS — K219 Gastro-esophageal reflux disease without esophagitis: Secondary | ICD-10-CM | POA: Diagnosis not present

## 2023-01-16 DIAGNOSIS — K900 Celiac disease: Secondary | ICD-10-CM | POA: Diagnosis not present

## 2023-01-16 DIAGNOSIS — Z9181 History of falling: Secondary | ICD-10-CM | POA: Diagnosis not present

## 2023-01-16 DIAGNOSIS — M800AXD Age-related osteoporosis with current pathological fracture, other site, subsequent encounter for fracture with routine healing: Secondary | ICD-10-CM | POA: Diagnosis not present

## 2023-01-16 DIAGNOSIS — N3281 Overactive bladder: Secondary | ICD-10-CM | POA: Diagnosis not present

## 2023-01-16 DIAGNOSIS — I10 Essential (primary) hypertension: Secondary | ICD-10-CM | POA: Diagnosis not present

## 2023-01-16 DIAGNOSIS — E785 Hyperlipidemia, unspecified: Secondary | ICD-10-CM | POA: Diagnosis not present

## 2023-01-17 DIAGNOSIS — M800AXD Age-related osteoporosis with current pathological fracture, other site, subsequent encounter for fracture with routine healing: Secondary | ICD-10-CM | POA: Diagnosis not present

## 2023-01-17 DIAGNOSIS — I10 Essential (primary) hypertension: Secondary | ICD-10-CM | POA: Diagnosis not present

## 2023-01-17 DIAGNOSIS — K900 Celiac disease: Secondary | ICD-10-CM | POA: Diagnosis not present

## 2023-01-17 DIAGNOSIS — Z9181 History of falling: Secondary | ICD-10-CM | POA: Diagnosis not present

## 2023-01-17 DIAGNOSIS — K219 Gastro-esophageal reflux disease without esophagitis: Secondary | ICD-10-CM | POA: Diagnosis not present

## 2023-01-17 DIAGNOSIS — E785 Hyperlipidemia, unspecified: Secondary | ICD-10-CM | POA: Diagnosis not present

## 2023-01-17 DIAGNOSIS — N3281 Overactive bladder: Secondary | ICD-10-CM | POA: Diagnosis not present

## 2023-01-23 DIAGNOSIS — K219 Gastro-esophageal reflux disease without esophagitis: Secondary | ICD-10-CM | POA: Diagnosis not present

## 2023-01-23 DIAGNOSIS — E785 Hyperlipidemia, unspecified: Secondary | ICD-10-CM | POA: Diagnosis not present

## 2023-01-23 DIAGNOSIS — M800AXD Age-related osteoporosis with current pathological fracture, other site, subsequent encounter for fracture with routine healing: Secondary | ICD-10-CM | POA: Diagnosis not present

## 2023-01-23 DIAGNOSIS — K900 Celiac disease: Secondary | ICD-10-CM | POA: Diagnosis not present

## 2023-01-23 DIAGNOSIS — I10 Essential (primary) hypertension: Secondary | ICD-10-CM | POA: Diagnosis not present

## 2023-01-23 DIAGNOSIS — Z9181 History of falling: Secondary | ICD-10-CM | POA: Diagnosis not present

## 2023-01-23 DIAGNOSIS — N3281 Overactive bladder: Secondary | ICD-10-CM | POA: Diagnosis not present

## 2023-01-25 DIAGNOSIS — Z9181 History of falling: Secondary | ICD-10-CM | POA: Diagnosis not present

## 2023-01-25 DIAGNOSIS — K219 Gastro-esophageal reflux disease without esophagitis: Secondary | ICD-10-CM | POA: Diagnosis not present

## 2023-01-25 DIAGNOSIS — M800AXD Age-related osteoporosis with current pathological fracture, other site, subsequent encounter for fracture with routine healing: Secondary | ICD-10-CM | POA: Diagnosis not present

## 2023-01-25 DIAGNOSIS — N3281 Overactive bladder: Secondary | ICD-10-CM | POA: Diagnosis not present

## 2023-01-25 DIAGNOSIS — K900 Celiac disease: Secondary | ICD-10-CM | POA: Diagnosis not present

## 2023-01-25 DIAGNOSIS — E785 Hyperlipidemia, unspecified: Secondary | ICD-10-CM | POA: Diagnosis not present

## 2023-01-25 DIAGNOSIS — I10 Essential (primary) hypertension: Secondary | ICD-10-CM | POA: Diagnosis not present

## 2023-01-26 ENCOUNTER — Other Ambulatory Visit: Payer: Self-pay | Admitting: Internal Medicine

## 2023-01-29 ENCOUNTER — Ambulatory Visit (INDEPENDENT_AMBULATORY_CARE_PROVIDER_SITE_OTHER): Payer: Medicare Other

## 2023-01-29 DIAGNOSIS — Z9181 History of falling: Secondary | ICD-10-CM | POA: Diagnosis not present

## 2023-01-29 DIAGNOSIS — E785 Hyperlipidemia, unspecified: Secondary | ICD-10-CM | POA: Diagnosis not present

## 2023-01-29 DIAGNOSIS — K900 Celiac disease: Secondary | ICD-10-CM | POA: Diagnosis not present

## 2023-01-29 DIAGNOSIS — N3281 Overactive bladder: Secondary | ICD-10-CM | POA: Diagnosis not present

## 2023-01-29 DIAGNOSIS — M800AXD Age-related osteoporosis with current pathological fracture, other site, subsequent encounter for fracture with routine healing: Secondary | ICD-10-CM | POA: Diagnosis not present

## 2023-01-29 DIAGNOSIS — K219 Gastro-esophageal reflux disease without esophagitis: Secondary | ICD-10-CM | POA: Diagnosis not present

## 2023-01-29 DIAGNOSIS — F419 Anxiety disorder, unspecified: Secondary | ICD-10-CM | POA: Diagnosis not present

## 2023-01-29 DIAGNOSIS — I10 Essential (primary) hypertension: Secondary | ICD-10-CM | POA: Diagnosis not present

## 2023-01-31 ENCOUNTER — Encounter (INDEPENDENT_AMBULATORY_CARE_PROVIDER_SITE_OTHER): Payer: Medicare Other | Admitting: Ophthalmology

## 2023-02-01 ENCOUNTER — Other Ambulatory Visit: Payer: Self-pay | Admitting: Family Medicine

## 2023-02-01 DIAGNOSIS — I10 Essential (primary) hypertension: Secondary | ICD-10-CM

## 2023-02-06 DIAGNOSIS — N3281 Overactive bladder: Secondary | ICD-10-CM | POA: Diagnosis not present

## 2023-02-06 DIAGNOSIS — E785 Hyperlipidemia, unspecified: Secondary | ICD-10-CM | POA: Diagnosis not present

## 2023-02-06 DIAGNOSIS — I10 Essential (primary) hypertension: Secondary | ICD-10-CM | POA: Diagnosis not present

## 2023-02-06 DIAGNOSIS — Z9181 History of falling: Secondary | ICD-10-CM | POA: Diagnosis not present

## 2023-02-06 DIAGNOSIS — K219 Gastro-esophageal reflux disease without esophagitis: Secondary | ICD-10-CM | POA: Diagnosis not present

## 2023-02-06 DIAGNOSIS — M800AXD Age-related osteoporosis with current pathological fracture, other site, subsequent encounter for fracture with routine healing: Secondary | ICD-10-CM | POA: Diagnosis not present

## 2023-02-06 DIAGNOSIS — K900 Celiac disease: Secondary | ICD-10-CM | POA: Diagnosis not present

## 2023-02-09 DIAGNOSIS — E785 Hyperlipidemia, unspecified: Secondary | ICD-10-CM | POA: Diagnosis not present

## 2023-02-09 DIAGNOSIS — I10 Essential (primary) hypertension: Secondary | ICD-10-CM | POA: Diagnosis not present

## 2023-02-09 DIAGNOSIS — K900 Celiac disease: Secondary | ICD-10-CM | POA: Diagnosis not present

## 2023-02-09 DIAGNOSIS — M800AXD Age-related osteoporosis with current pathological fracture, other site, subsequent encounter for fracture with routine healing: Secondary | ICD-10-CM | POA: Diagnosis not present

## 2023-02-09 DIAGNOSIS — N3281 Overactive bladder: Secondary | ICD-10-CM | POA: Diagnosis not present

## 2023-02-09 DIAGNOSIS — Z9181 History of falling: Secondary | ICD-10-CM | POA: Diagnosis not present

## 2023-02-09 DIAGNOSIS — K219 Gastro-esophageal reflux disease without esophagitis: Secondary | ICD-10-CM | POA: Diagnosis not present

## 2023-02-16 DIAGNOSIS — Z9181 History of falling: Secondary | ICD-10-CM | POA: Diagnosis not present

## 2023-02-16 DIAGNOSIS — M800AXD Age-related osteoporosis with current pathological fracture, other site, subsequent encounter for fracture with routine healing: Secondary | ICD-10-CM | POA: Diagnosis not present

## 2023-02-16 DIAGNOSIS — N3281 Overactive bladder: Secondary | ICD-10-CM | POA: Diagnosis not present

## 2023-02-16 DIAGNOSIS — E785 Hyperlipidemia, unspecified: Secondary | ICD-10-CM | POA: Diagnosis not present

## 2023-02-16 DIAGNOSIS — K900 Celiac disease: Secondary | ICD-10-CM | POA: Diagnosis not present

## 2023-02-16 DIAGNOSIS — I10 Essential (primary) hypertension: Secondary | ICD-10-CM | POA: Diagnosis not present

## 2023-02-16 DIAGNOSIS — K219 Gastro-esophageal reflux disease without esophagitis: Secondary | ICD-10-CM | POA: Diagnosis not present

## 2023-02-20 DIAGNOSIS — Z9181 History of falling: Secondary | ICD-10-CM | POA: Diagnosis not present

## 2023-02-20 DIAGNOSIS — E785 Hyperlipidemia, unspecified: Secondary | ICD-10-CM | POA: Diagnosis not present

## 2023-02-20 DIAGNOSIS — K219 Gastro-esophageal reflux disease without esophagitis: Secondary | ICD-10-CM | POA: Diagnosis not present

## 2023-02-20 DIAGNOSIS — K900 Celiac disease: Secondary | ICD-10-CM | POA: Diagnosis not present

## 2023-02-20 DIAGNOSIS — M800AXD Age-related osteoporosis with current pathological fracture, other site, subsequent encounter for fracture with routine healing: Secondary | ICD-10-CM | POA: Diagnosis not present

## 2023-02-20 DIAGNOSIS — N3281 Overactive bladder: Secondary | ICD-10-CM | POA: Diagnosis not present

## 2023-02-20 DIAGNOSIS — I10 Essential (primary) hypertension: Secondary | ICD-10-CM | POA: Diagnosis not present

## 2023-02-23 DIAGNOSIS — E785 Hyperlipidemia, unspecified: Secondary | ICD-10-CM | POA: Diagnosis not present

## 2023-02-23 DIAGNOSIS — N3281 Overactive bladder: Secondary | ICD-10-CM | POA: Diagnosis not present

## 2023-02-23 DIAGNOSIS — K219 Gastro-esophageal reflux disease without esophagitis: Secondary | ICD-10-CM | POA: Diagnosis not present

## 2023-02-23 DIAGNOSIS — K900 Celiac disease: Secondary | ICD-10-CM | POA: Diagnosis not present

## 2023-02-23 DIAGNOSIS — Z9181 History of falling: Secondary | ICD-10-CM | POA: Diagnosis not present

## 2023-02-23 DIAGNOSIS — M800AXD Age-related osteoporosis with current pathological fracture, other site, subsequent encounter for fracture with routine healing: Secondary | ICD-10-CM | POA: Diagnosis not present

## 2023-02-23 DIAGNOSIS — I10 Essential (primary) hypertension: Secondary | ICD-10-CM | POA: Diagnosis not present

## 2023-02-26 DIAGNOSIS — N3281 Overactive bladder: Secondary | ICD-10-CM | POA: Diagnosis not present

## 2023-02-26 DIAGNOSIS — K219 Gastro-esophageal reflux disease without esophagitis: Secondary | ICD-10-CM | POA: Diagnosis not present

## 2023-02-26 DIAGNOSIS — M800AXD Age-related osteoporosis with current pathological fracture, other site, subsequent encounter for fracture with routine healing: Secondary | ICD-10-CM | POA: Diagnosis not present

## 2023-02-26 DIAGNOSIS — K900 Celiac disease: Secondary | ICD-10-CM | POA: Diagnosis not present

## 2023-02-26 DIAGNOSIS — I10 Essential (primary) hypertension: Secondary | ICD-10-CM | POA: Diagnosis not present

## 2023-02-26 DIAGNOSIS — E785 Hyperlipidemia, unspecified: Secondary | ICD-10-CM | POA: Diagnosis not present

## 2023-02-26 DIAGNOSIS — Z9181 History of falling: Secondary | ICD-10-CM | POA: Diagnosis not present

## 2023-03-06 DIAGNOSIS — N3281 Overactive bladder: Secondary | ICD-10-CM | POA: Diagnosis not present

## 2023-03-06 DIAGNOSIS — I10 Essential (primary) hypertension: Secondary | ICD-10-CM | POA: Diagnosis not present

## 2023-03-06 DIAGNOSIS — K219 Gastro-esophageal reflux disease without esophagitis: Secondary | ICD-10-CM | POA: Diagnosis not present

## 2023-03-06 DIAGNOSIS — E785 Hyperlipidemia, unspecified: Secondary | ICD-10-CM | POA: Diagnosis not present

## 2023-03-06 DIAGNOSIS — Z9181 History of falling: Secondary | ICD-10-CM | POA: Diagnosis not present

## 2023-03-06 DIAGNOSIS — M800AXD Age-related osteoporosis with current pathological fracture, other site, subsequent encounter for fracture with routine healing: Secondary | ICD-10-CM | POA: Diagnosis not present

## 2023-03-06 DIAGNOSIS — K900 Celiac disease: Secondary | ICD-10-CM | POA: Diagnosis not present

## 2023-03-07 ENCOUNTER — Other Ambulatory Visit: Payer: Self-pay | Admitting: Family Medicine

## 2023-03-07 ENCOUNTER — Ambulatory Visit (INDEPENDENT_AMBULATORY_CARE_PROVIDER_SITE_OTHER): Payer: Medicare Other | Admitting: Family Medicine

## 2023-03-07 ENCOUNTER — Encounter: Payer: Self-pay | Admitting: Family Medicine

## 2023-03-07 VITALS — BP 149/72 | HR 83 | Temp 98.5°F | Ht 60.0 in | Wt 95.6 lb

## 2023-03-07 DIAGNOSIS — L03211 Cellulitis of face: Secondary | ICD-10-CM

## 2023-03-07 MED ORDER — CEPHALEXIN 500 MG PO CAPS: 500.0000 mg | ORAL_CAPSULE | Freq: Three times a day (TID) | ORAL | 0 refills | Status: DC

## 2023-03-07 NOTE — Progress Notes (Signed)
No chief complaint on file.   HPI  Patient presents today for several days of pain at the right external ear/pinna. Not getting better. Tried using vaseline. No fever, congestion, sore throat or cough.  PMH: Smoking status noted ROS: Per HPI  Objective: BP (!) 149/72   Pulse 83   Temp 98.5 F (36.9 C)   Ht 5' (1.524 m)   Wt 95 lb 9.6 oz (43.4 kg)   SpO2 94%   BMI 18.67 kg/m  Gen: NAD, alert, cooperative with exam HEENT: NCAT, EOMI, PERRL. Nasal passages clear. Tms nml. The right pinna has 1.5 X 1 cm moderate erythema withh tenderness for palpation. No deformity of the auricle.  Resp: CTABL, no wheezes, non-labored Abd: SNTND, BS present, no guarding or organomegaly Ext: No edema, warm Neuro: Alert and oriented, No gross deficits  Assessment and plan:  1. Cellulitis of face     Meds ordered this encounter  Medications   cephALEXin (KEFLEX) 500 MG capsule    Sig: Take 1 capsule (500 mg total) by mouth 3 (three) times daily.    Dispense:  30 capsule    Refill:  0      Follow up as needed.  Mechele Claude, MD

## 2023-03-08 DIAGNOSIS — M800AXD Age-related osteoporosis with current pathological fracture, other site, subsequent encounter for fracture with routine healing: Secondary | ICD-10-CM | POA: Diagnosis not present

## 2023-03-08 DIAGNOSIS — K900 Celiac disease: Secondary | ICD-10-CM | POA: Diagnosis not present

## 2023-03-08 DIAGNOSIS — E785 Hyperlipidemia, unspecified: Secondary | ICD-10-CM | POA: Diagnosis not present

## 2023-03-08 DIAGNOSIS — N3281 Overactive bladder: Secondary | ICD-10-CM | POA: Diagnosis not present

## 2023-03-08 DIAGNOSIS — Z9181 History of falling: Secondary | ICD-10-CM | POA: Diagnosis not present

## 2023-03-08 DIAGNOSIS — K219 Gastro-esophageal reflux disease without esophagitis: Secondary | ICD-10-CM | POA: Diagnosis not present

## 2023-03-08 DIAGNOSIS — I10 Essential (primary) hypertension: Secondary | ICD-10-CM | POA: Diagnosis not present

## 2023-03-14 DIAGNOSIS — K219 Gastro-esophageal reflux disease without esophagitis: Secondary | ICD-10-CM | POA: Diagnosis not present

## 2023-03-14 DIAGNOSIS — K900 Celiac disease: Secondary | ICD-10-CM | POA: Diagnosis not present

## 2023-03-14 DIAGNOSIS — E785 Hyperlipidemia, unspecified: Secondary | ICD-10-CM | POA: Diagnosis not present

## 2023-03-14 DIAGNOSIS — Z9181 History of falling: Secondary | ICD-10-CM | POA: Diagnosis not present

## 2023-03-14 DIAGNOSIS — I10 Essential (primary) hypertension: Secondary | ICD-10-CM | POA: Diagnosis not present

## 2023-03-14 DIAGNOSIS — M800AXD Age-related osteoporosis with current pathological fracture, other site, subsequent encounter for fracture with routine healing: Secondary | ICD-10-CM | POA: Diagnosis not present

## 2023-03-14 DIAGNOSIS — N3281 Overactive bladder: Secondary | ICD-10-CM | POA: Diagnosis not present

## 2023-03-16 DIAGNOSIS — K219 Gastro-esophageal reflux disease without esophagitis: Secondary | ICD-10-CM | POA: Diagnosis not present

## 2023-03-16 DIAGNOSIS — E785 Hyperlipidemia, unspecified: Secondary | ICD-10-CM | POA: Diagnosis not present

## 2023-03-16 DIAGNOSIS — I10 Essential (primary) hypertension: Secondary | ICD-10-CM | POA: Diagnosis not present

## 2023-03-16 DIAGNOSIS — N3281 Overactive bladder: Secondary | ICD-10-CM | POA: Diagnosis not present

## 2023-03-16 DIAGNOSIS — Z9181 History of falling: Secondary | ICD-10-CM | POA: Diagnosis not present

## 2023-03-16 DIAGNOSIS — M800AXD Age-related osteoporosis with current pathological fracture, other site, subsequent encounter for fracture with routine healing: Secondary | ICD-10-CM | POA: Diagnosis not present

## 2023-03-16 DIAGNOSIS — K900 Celiac disease: Secondary | ICD-10-CM | POA: Diagnosis not present

## 2023-03-19 ENCOUNTER — Other Ambulatory Visit: Payer: Self-pay | Admitting: Physician Assistant

## 2023-03-19 DIAGNOSIS — K219 Gastro-esophageal reflux disease without esophagitis: Secondary | ICD-10-CM

## 2023-03-20 DIAGNOSIS — M800AXD Age-related osteoporosis with current pathological fracture, other site, subsequent encounter for fracture with routine healing: Secondary | ICD-10-CM | POA: Diagnosis not present

## 2023-03-20 DIAGNOSIS — K219 Gastro-esophageal reflux disease without esophagitis: Secondary | ICD-10-CM | POA: Diagnosis not present

## 2023-03-20 DIAGNOSIS — E785 Hyperlipidemia, unspecified: Secondary | ICD-10-CM | POA: Diagnosis not present

## 2023-03-20 DIAGNOSIS — Z9181 History of falling: Secondary | ICD-10-CM | POA: Diagnosis not present

## 2023-03-20 DIAGNOSIS — K900 Celiac disease: Secondary | ICD-10-CM | POA: Diagnosis not present

## 2023-03-20 DIAGNOSIS — N3281 Overactive bladder: Secondary | ICD-10-CM | POA: Diagnosis not present

## 2023-03-20 DIAGNOSIS — I10 Essential (primary) hypertension: Secondary | ICD-10-CM | POA: Diagnosis not present

## 2023-03-22 DIAGNOSIS — I10 Essential (primary) hypertension: Secondary | ICD-10-CM | POA: Diagnosis not present

## 2023-03-22 DIAGNOSIS — Z9181 History of falling: Secondary | ICD-10-CM | POA: Diagnosis not present

## 2023-03-22 DIAGNOSIS — K900 Celiac disease: Secondary | ICD-10-CM | POA: Diagnosis not present

## 2023-03-22 DIAGNOSIS — E785 Hyperlipidemia, unspecified: Secondary | ICD-10-CM | POA: Diagnosis not present

## 2023-03-22 DIAGNOSIS — M800AXD Age-related osteoporosis with current pathological fracture, other site, subsequent encounter for fracture with routine healing: Secondary | ICD-10-CM | POA: Diagnosis not present

## 2023-03-22 DIAGNOSIS — N3281 Overactive bladder: Secondary | ICD-10-CM | POA: Diagnosis not present

## 2023-03-22 DIAGNOSIS — K219 Gastro-esophageal reflux disease without esophagitis: Secondary | ICD-10-CM | POA: Diagnosis not present

## 2023-03-26 DIAGNOSIS — K219 Gastro-esophageal reflux disease without esophagitis: Secondary | ICD-10-CM | POA: Diagnosis not present

## 2023-03-26 DIAGNOSIS — K900 Celiac disease: Secondary | ICD-10-CM | POA: Diagnosis not present

## 2023-03-26 DIAGNOSIS — E785 Hyperlipidemia, unspecified: Secondary | ICD-10-CM | POA: Diagnosis not present

## 2023-03-26 DIAGNOSIS — I10 Essential (primary) hypertension: Secondary | ICD-10-CM | POA: Diagnosis not present

## 2023-03-26 DIAGNOSIS — N3281 Overactive bladder: Secondary | ICD-10-CM | POA: Diagnosis not present

## 2023-03-26 DIAGNOSIS — M800AXD Age-related osteoporosis with current pathological fracture, other site, subsequent encounter for fracture with routine healing: Secondary | ICD-10-CM | POA: Diagnosis not present

## 2023-03-26 DIAGNOSIS — Z9181 History of falling: Secondary | ICD-10-CM | POA: Diagnosis not present

## 2023-03-28 ENCOUNTER — Other Ambulatory Visit: Payer: Self-pay | Admitting: Internal Medicine

## 2023-03-29 ENCOUNTER — Telehealth: Payer: Self-pay | Admitting: *Deleted

## 2023-03-29 NOTE — Telephone Encounter (Signed)
TC from [facility] w/ [facility] Has done really well w/ PT being discharged from services Just FYI has some redness on back of head, does blanch but is not tender

## 2023-04-06 ENCOUNTER — Telehealth: Payer: Self-pay | Admitting: Family Medicine

## 2023-04-06 NOTE — Telephone Encounter (Signed)
I think she is fine at her age and should not have to do mammograms anymore unless she feels or has any problems that she notices.

## 2023-04-06 NOTE — Telephone Encounter (Signed)
Tried calling pt. Number busy x2.

## 2023-04-06 NOTE — Telephone Encounter (Signed)
Spoke with patient. She does not have any breast concerns. Pt just wants to know in general does she need to have mammograms?

## 2023-04-06 NOTE — Telephone Encounter (Signed)
Pt informed and has no further concerns. 

## 2023-04-11 ENCOUNTER — Other Ambulatory Visit: Payer: Self-pay | Admitting: Physician Assistant

## 2023-04-11 DIAGNOSIS — K219 Gastro-esophageal reflux disease without esophagitis: Secondary | ICD-10-CM

## 2023-04-15 ENCOUNTER — Other Ambulatory Visit: Payer: Self-pay | Admitting: Physician Assistant

## 2023-04-15 ENCOUNTER — Other Ambulatory Visit: Payer: Self-pay | Admitting: Internal Medicine

## 2023-04-15 DIAGNOSIS — K219 Gastro-esophageal reflux disease without esophagitis: Secondary | ICD-10-CM

## 2023-04-18 ENCOUNTER — Encounter: Payer: Self-pay | Admitting: Family Medicine

## 2023-04-18 ENCOUNTER — Ambulatory Visit (INDEPENDENT_AMBULATORY_CARE_PROVIDER_SITE_OTHER): Payer: Medicare Other | Admitting: Family Medicine

## 2023-04-18 VITALS — BP 138/92 | HR 87 | Ht 60.0 in | Wt 97.0 lb

## 2023-04-18 DIAGNOSIS — E782 Mixed hyperlipidemia: Secondary | ICD-10-CM | POA: Diagnosis not present

## 2023-04-18 DIAGNOSIS — I10 Essential (primary) hypertension: Secondary | ICD-10-CM

## 2023-04-18 DIAGNOSIS — K219 Gastro-esophageal reflux disease without esophagitis: Secondary | ICD-10-CM

## 2023-04-18 MED ORDER — ROSUVASTATIN CALCIUM 5 MG PO TABS: ORAL_TABLET | ORAL | 3 refills | Status: DC

## 2023-04-18 MED ORDER — AMLODIPINE BESYLATE 5 MG PO TABS: 5.0000 mg | ORAL_TABLET | Freq: Every day | ORAL | 3 refills | Status: AC

## 2023-04-18 NOTE — Progress Notes (Signed)
BP (!) 138/92   Pulse 87   Ht 5' (1.524 m)   Wt 97 lb (44 kg)   SpO2 96%   BMI 18.94 kg/m    Subjective:   Patient ID: Kristi Sawyer, female    DOB: 1926/10/15, 89 y.o.   MRN: [id number]  HPI: Kristi Sawyer is a 89 y.o. female presenting on 04/18/2023 for No chief complaint on file.   HPI Hypertension Patient is currently on amlodipine, and their blood pressure today is 138/92. Patient denies any lightheadedness or dizziness. Patient denies headaches, blurred vision, chest pains, shortness of breath, or weakness. Denies any side effects from medication and is content with current medication.   Hyperlipidemia Patient is coming in for recheck of his hyperlipidemia. The patient is currently taking fish oils and Crestor. They deny any issues with myalgias or history of liver damage from it. They deny any focal numbness or weakness or chest pain.   GERD Patient is currently on omeprazole.  She denies any major symptoms or abdominal pain or belching or burping. She denies any blood in her stool or lightheadedness or dizziness.   Relevant past medical, surgical, family and social history reviewed and updated as indicated. Interim medical history since our last visit reviewed. Allergies and medications reviewed and updated.  Review of Systems  Constitutional:  Negative for chills and fever.  Eyes:  Negative for visual disturbance.  Respiratory:  Negative for chest tightness and shortness of breath.   Cardiovascular:  Negative for chest pain and leg swelling.  Genitourinary:  Negative for difficulty urinating and dysuria.  Musculoskeletal:  Negative for back pain and gait problem.  Skin:  Negative for rash.  Neurological:  Negative for light-headedness and headaches.  Psychiatric/Behavioral:  Negative for agitation and behavioral problems.   All other systems reviewed and are negative.   Per HPI unless specifically indicated above   Allergies as of 04/18/2023       Reactions    Prevnar 13 [pneumococcal 13-val Conj Vacc] Hives   Red/streaking arm   Penicillins Hives   Has patient had a PCN reaction causing immediate rash, facial/tongue/throat swelling, SOB or lightheadedness with hypotension: No Has patient had a PCN reaction causing severe rash involving mucus membranes or skin necrosis: No Has patient had a PCN reaction that required hospitalization: No Has patient had a PCN reaction occurring within the last 10 years: No If all of the above answers are "NO", then may proceed with Cephalosporin use.   Tetanus Toxoids Other (See Comments)   Area of injection was red and streaked.   Codeine Nausea Only   Sulfonamide Derivatives Nausea Only        Medication List        Accurate as of April 18, 2023  1:17 PM. If you have any questions, ask your nurse or doctor.          STOP taking these medications    cephALEXin 500 MG capsule Commonly known as: KEFLEX Stopped by: Elige Radon Jaynia Fendley, MD       TAKE these medications    acetaminophen 325 MG tablet Commonly known as: TYLENOL Take 2 tablets (650 mg total) by mouth every 6 (six) hours as needed for mild pain (or Fever >/= 101).   amLODipine 5 MG tablet Commonly known as: NORVASC Take 1 tablet (5 mg total) by mouth daily.   CALTRATE PLUS PO Take 1 tablet by mouth daily.   CENTRUM SILVER PO Take 1 tablet by mouth daily.  [facility] Vitamin C 500 MG tablet Generic drug: ascorbic acid TAKE 1 TABLET (500 MG TOTAL) BY MOUTH DAILY.   Fish Oil 1000 MG Caps Take 1 capsule by mouth 3 (three) times daily.   Gemtesa 75 MG Tabs Generic drug: Vibegron Take 1 tablet by mouth daily.   Integra 62.5-62.5-40-3 MG Caps TAKE 1 CAPSULE BY MOUTH EVERY DAY   Inulin 1.5 g Chew Chew 5 tablets by mouth daily. FIBER CHOICE   levocetirizine 5 MG tablet Commonly known as: XYZAL TAKE 1 TABLET (5 MG TOTAL) BY MOUTH AT BEDTIME AS NEEDED (ITCHING/ ALLERGIC REACTION).   metoCLOPramide 10 MG tablet Commonly known as:  REGLAN Take 1 tablet (10 mg total) by mouth at bedtime.   omeprazole 20 MG capsule Commonly known as: PRILOSEC PLEASE MAKE A FOLLOW UP APPOINTMENT. (TAKE1-2 CAPSULES BY MOUTH DAILY(20-40MG )   rosuvastatin 5 MG tablet Commonly known as: CRESTOR TAKE 1 TABLET BY MOUTH ON MONDAY, WEDNESDAY, AND FRIDAY   triamcinolone cream 0.1 % Commonly known as: KENALOG Apply 1 Application topically See admin instructions. Apply 2-2 applications daily as needed for itching   Vitamin D 50 MCG (2000 UT) Caps Take by mouth as directed. Take 1 tablet daily except none on saturdays and sundays   zinc sulfate 220 (50 Zn) MG capsule TAKE 1 CAPSULE BY MOUTH EVERY DAY         Objective:   BP (!) 138/92   Pulse 87   Ht 5' (1.524 m)   Wt 97 lb (44 kg)   SpO2 96%   BMI 18.94 kg/m   Wt Readings from Last 3 Encounters:  04/18/23 97 lb (44 kg)  03/07/23 95 lb 9.6 oz (43.4 kg)  12/20/22 94 lb (42.6 kg)    Physical Exam Vitals and nursing note reviewed.  Constitutional:      General: She is not in acute distress.    Appearance: She is well-developed. She is not diaphoretic.  Eyes:     Conjunctiva/sclera: Conjunctivae normal.  Cardiovascular:     Rate and Rhythm: Normal rate and regular rhythm.     Heart sounds: Normal heart sounds. No murmur heard. Pulmonary:     Effort: Pulmonary effort is normal. No respiratory distress.     Breath sounds: Normal breath sounds. No wheezing.  Musculoskeletal:        General: No swelling. Normal range of motion.  Skin:    General: Skin is warm and dry.     Findings: No rash.  Neurological:     Mental Status: She is alert and oriented to person, place, and time.     Coordination: Coordination normal.  Psychiatric:        Behavior: Behavior normal.       Assessment & Plan:   Problem List Items Addressed This Visit       Cardiovascular and Mediastinum   Hypertension   Relevant Medications   amLODipine (NORVASC) 5 MG tablet   rosuvastatin  (CRESTOR) 5 MG tablet   Other Relevant Orders   CBC with Differential/Platelet   CMP14+EGFR   Lipid panel     Digestive   GERD (gastroesophageal reflux disease)   Relevant Orders   CBC with Differential/Platelet   CMP14+EGFR   Lipid panel     Other   Hyperlipidemia - Primary   Relevant Medications   amLODipine (NORVASC) 5 MG tablet   rosuvastatin (CRESTOR) 5 MG tablet   Other Relevant Orders   CBC with Differential/Platelet   CMP14+EGFR   Lipid panel  Continue current medicine, seems to be doing well, will check blood work today. Follow up plan: Return in about 6 months (around 10/18/2023), or if symptoms worsen or fail to improve, for Hypertension and cholesterol recheck.  Counseling provided for all of the vaccine components Orders Placed This Encounter  Procedures   CBC with Differential/Platelet   CMP14+EGFR   Lipid panel    Arville Care, MD [facility] 04/18/2023, 1:17 PM

## 2023-04-19 LAB — CBC WITH DIFFERENTIAL/PLATELET
Basophils Absolute: 0 10*3/uL (ref 0.0–0.2)
Basos: 1 %
EOS (ABSOLUTE): 0.1 10*3/uL (ref 0.0–0.4)
Eos: 1 %
Hematocrit: 41.5 % (ref 34.0–46.6)
Hemoglobin: 13.7 g/dL (ref 11.1–15.9)
Immature Grans (Abs): 0 10*3/uL (ref 0.0–0.1)
Immature Granulocytes: 0 %
Lymphocytes Absolute: 2.7 10*3/uL (ref 0.7–3.1)
Lymphs: 32 %
MCH: 30.3 pg (ref 26.6–33.0)
MCHC: 33 g/dL (ref 31.5–35.7)
MCV: 92 fL (ref 79–97)
Monocytes Absolute: 1 10*3/uL — ABNORMAL HIGH (ref 0.1–0.9)
Monocytes: 12 %
Neutrophils Absolute: 4.6 10*3/uL (ref 1.4–7.0)
Neutrophils: 54 %
Platelets: 240 10*3/uL (ref 150–450)
RBC: 4.52 x10E6/uL (ref 3.77–5.28)
RDW: 12.2 % (ref 11.7–15.4)
WBC: 8.5 10*3/uL (ref 3.4–10.8)

## 2023-04-19 LAB — CMP14+EGFR
ALT: 18 IU/L (ref 0–32)
AST: 20 IU/L (ref 0–40)
Albumin/Globulin Ratio: 2.3 — ABNORMAL HIGH (ref 1.2–2.2)
Albumin: 4.4 g/dL (ref 3.6–4.6)
Alkaline Phosphatase: 55 IU/L (ref 44–121)
BUN/Creatinine Ratio: 22 (ref 12–28)
BUN: 12 mg/dL (ref 10–36)
Bilirubin Total: 0.2 mg/dL (ref 0.0–1.2)
CO2: 26 mmol/L (ref 20–29)
Calcium: 10.1 mg/dL (ref 8.7–10.3)
Chloride: 100 mmol/L (ref 96–106)
Creatinine, Ser: 0.54 mg/dL — ABNORMAL LOW (ref 0.57–1.00)
Globulin, Total: 1.9 g/dL (ref 1.5–4.5)
Glucose: 87 mg/dL (ref 70–99)
Potassium: 4.7 mmol/L (ref 3.5–5.2)
Sodium: 137 mmol/L (ref 134–144)
Total Protein: 6.3 g/dL (ref 6.0–8.5)
eGFR: 85 mL/min/{1.73_m2} (ref >59)

## 2023-04-19 LAB — LIPID PANEL
Chol/HDL Ratio: 2.1 ratio (ref 0.0–4.4)
Cholesterol, Total: 201 mg/dL — ABNORMAL HIGH (ref 100–199)
HDL: 96 mg/dL (ref >39)
LDL Chol Calc (NIH): 94 mg/dL (ref 0–99)
Triglycerides: 63 mg/dL (ref 0–149)
VLDL Cholesterol Cal: 11 mg/dL (ref 5–40)

## 2023-04-26 ENCOUNTER — Encounter: Payer: Self-pay | Admitting: Nurse Practitioner

## 2023-04-26 ENCOUNTER — Ambulatory Visit (INDEPENDENT_AMBULATORY_CARE_PROVIDER_SITE_OTHER): Payer: Medicare Other | Admitting: Nurse Practitioner

## 2023-04-26 VITALS — BP 147/76 | HR 75 | Temp 98.0°F | Resp 20 | Ht 60.0 in | Wt 98.0 lb

## 2023-04-26 DIAGNOSIS — L989 Disorder of the skin and subcutaneous tissue, unspecified: Secondary | ICD-10-CM | POA: Diagnosis not present

## 2023-04-26 MED ORDER — DOXYCYCLINE HYCLATE 100 MG PO TABS: 100.0000 mg | ORAL_TABLET | Freq: Two times a day (BID) | ORAL | 0 refills | Status: DC

## 2023-04-26 NOTE — Progress Notes (Signed)
   Subjective:    Patient ID: Kristi Sawyer, female    DOB: 24-Jan-1926, 89 y.o.   MRN: [id number]   Chief Complaint: Sore on right leg (For 6 weeks/)   Patient has a sore place on her right lower leg. Has been using lotion on it but no better. Denies injury. Doe snot recall any bug biting her.    Patient Active Problem List   Diagnosis Date Noted   Falls 12/19/2021   Ataxic gait    Physical deconditioning    Osteoporosis without current pathological fracture 11/23/2020   Hypertension 10/28/2017   Overactive bladder 06/18/2015   Hyperlipidemia 09/26/2010   ANXIETY 09/26/2010   GERD (gastroesophageal reflux disease) 09/26/2010   CELIAC SPRUE 09/26/2010       Review of Systems  Constitutional:  Negative for diaphoresis.  Eyes:  Negative for pain.  Respiratory:  Negative for shortness of breath.   Cardiovascular:  Negative for chest pain, palpitations and leg swelling.  Gastrointestinal:  Negative for abdominal pain.  Endocrine: Negative for polydipsia.  Skin:  Negative for rash.  Neurological:  Negative for dizziness, weakness and headaches.  Hematological:  Does not bruise/bleed easily.  All other systems reviewed and are negative.      Objective:   Physical Exam Vitals reviewed.  Constitutional:      Appearance: Normal appearance.  Cardiovascular:     Rate and Rhythm: Normal rate and regular rhythm.  Pulmonary:     Breath sounds: Normal breath sounds.  Skin:    Comments: 3cm erythematous annular lesion with central scabbing  Neurological:     General: No focal deficit present.     Mental Status: She is alert and oriented to person, place, and time.  Psychiatric:        Behavior: Behavior normal.    BP (!) 147/76   Pulse 75   Temp 98 F (36.7 C) (Temporal)   Resp 20   Ht 5' (1.524 m)   Wt 98 lb (44.5 kg)   SpO2 97%   BMI 19.14 kg/m         Assessment & Plan:   Kristi Sawyer in today with chief complaint of Sore on right leg (For 6  weeks/)   1. Benign skin lesion of thigh Do not pick or scratch at area RTO if not improving Meds ordered this encounter  Medications   doxycycline (VIBRA-TABS) 100 MG tablet    Sig: Take 1 tablet (100 mg total) by mouth 2 (two) times daily. 1 po bid    Dispense:  20 tablet    Refill:  0    Order Specific Question:   Supervising Provider    Answer:   Arville Care A [[id number]]       The above assessment and management plan was discussed with the patient. The patient verbalized understanding of and has agreed to the management plan. Patient is aware to call the clinic if symptoms persist or worsen. Patient is aware when to return to the clinic for a follow-up visit. Patient educated on when it is appropriate to go to the emergency department.   Mary-Margaret Daphine Deutscher, FNP

## 2023-05-15 ENCOUNTER — Other Ambulatory Visit: Payer: Self-pay | Admitting: Physician Assistant

## 2023-05-15 DIAGNOSIS — K219 Gastro-esophageal reflux disease without esophagitis: Secondary | ICD-10-CM

## 2023-05-21 ENCOUNTER — Telehealth: Payer: Self-pay | Admitting: Physician Assistant

## 2023-05-21 ENCOUNTER — Other Ambulatory Visit: Payer: Self-pay | Admitting: Physician Assistant

## 2023-05-21 DIAGNOSIS — K219 Gastro-esophageal reflux disease without esophagitis: Secondary | ICD-10-CM

## 2023-05-21 MED ORDER — METOCLOPRAMIDE HCL 10 MG PO TABS: ORAL_TABLET | ORAL | 0 refills | Status: DC

## 2023-05-21 NOTE — Telephone Encounter (Signed)
Okay for me to refill Sir? Last seen by Quentin Mulling PA-C in April 2023.

## 2023-05-21 NOTE — Telephone Encounter (Signed)
I spoke with Kristi Sawyer and we set her up an appointment for 08/09/2023 at 11:30AM with Quentin Mulling PA-C. I will send in her metoclopramide refill.

## 2023-05-21 NOTE — Telephone Encounter (Signed)
Needs an appointment w/ an APP Marchelle Folks) and refill until she sees her

## 2023-05-21 NOTE — Telephone Encounter (Signed)
Patient needing medication refill on [facility]. Please advise.  [facility] [place]

## 2023-05-27 ENCOUNTER — Other Ambulatory Visit: Payer: Self-pay | Admitting: Internal Medicine

## 2023-05-29 ENCOUNTER — Ambulatory Visit (INDEPENDENT_AMBULATORY_CARE_PROVIDER_SITE_OTHER): Payer: Medicare Other

## 2023-05-29 ENCOUNTER — Encounter: Payer: Self-pay | Admitting: Family Medicine

## 2023-05-29 ENCOUNTER — Ambulatory Visit (INDEPENDENT_AMBULATORY_CARE_PROVIDER_SITE_OTHER): Payer: Medicare Other | Admitting: Family Medicine

## 2023-05-29 VITALS — BP 152/90 | HR 80 | Temp 98.7°F | Ht 60.0 in | Wt 96.8 lb

## 2023-05-29 DIAGNOSIS — I700 Atherosclerosis of aorta: Secondary | ICD-10-CM | POA: Diagnosis not present

## 2023-05-29 DIAGNOSIS — I10 Essential (primary) hypertension: Secondary | ICD-10-CM

## 2023-05-29 DIAGNOSIS — R0781 Pleurodynia: Secondary | ICD-10-CM | POA: Diagnosis not present

## 2023-05-29 DIAGNOSIS — S299XXA Unspecified injury of thorax, initial encounter: Secondary | ICD-10-CM

## 2023-05-29 NOTE — Progress Notes (Signed)
Acute Office Visit  Subjective:  Patient ID: Kristi Sawyer, female    DOB: 23-Apr-1926, 89 y.o.   MRN: [id number]  Chief Complaint  Patient presents with   Rib Injury    Right side rib pain Patient received Heimlich maneuver   HPI Patient is in today for possible broken ribs. States that Sunday at lunch she was choked on a piece of pork roast. Heimlich maneuver was performed and she has pain since then. Pain is located at right rib cage. Rates it as a 10/10 today. States that she does not get choked on food often. States that she does not have trouble swallowing medications. She has been using tylenol and ibuprofen.  Reports a history of broken rib on left side from December, from a fall.  Has appt with GI scheduled.   Hypertension  States that she does not have a monitor at home. States that she has taken her medications today. She does not see primary care until December.    ROS As per HPI  Objective:  BP (!) 152/90   Pulse 80   Temp 98.7 F (37.1 C)   Ht 5' (1.524 m)   Wt 96 lb 12.8 oz (43.9 kg)   SpO2 (!) 9%   BMI 18.90 kg/m   Physical Exam Constitutional:      General: She is awake. She is not in acute distress.    Appearance: Normal appearance. She is well-developed and well-groomed. She is not ill-appearing, toxic-appearing or diaphoretic.  Cardiovascular:     Rate and Rhythm: Normal rate.     Pulses: Normal pulses.          Radial pulses are 2+ on the right side and 2+ on the left side.       Posterior tibial pulses are 2+ on the right side and 2+ on the left side.     Heart sounds: Normal heart sounds. No murmur heard.    No gallop.  Pulmonary:     Effort: Pulmonary effort is normal. No respiratory distress.     Breath sounds: Normal breath sounds. No stridor. No wheezing, rhonchi or rales.  Musculoskeletal:     Cervical back: Full passive range of motion without pain and neck supple.     Right lower leg: No edema.     Left lower leg: No edema.      Comments: Frail, walks with a walker.   Skin:    General: Skin is warm.     Capillary Refill: Capillary refill takes less than 2 seconds.  Neurological:     General: No focal deficit present.     Mental Status: She is alert, oriented to person, place, and time and easily aroused. Mental status is at baseline.     GCS: GCS eye subscore is 4. GCS verbal subscore is 5. GCS motor subscore is 6.     Motor: No weakness.  Psychiatric:        Attention and Perception: Attention and perception normal.        Mood and Affect: Mood and affect normal.        Speech: Speech normal.        Behavior: Behavior normal. Behavior is cooperative.        Thought Content: Thought content normal. Thought content does not include homicidal or suicidal ideation. Thought content does not include homicidal or suicidal plan.        Cognition and Memory: Cognition and memory normal.  Judgment: Judgment normal.       05/29/2023   11:54 AM 04/26/2023   11:59 AM 04/18/2023    1:17 PM  Depression screen PHQ 2/9  Decreased Interest 0 0 0  Down, Depressed, Hopeless 0 0 0  PHQ - 2 Score 0 0 0  Altered sleeping 0 0 0  Tired, decreased energy 0 0 0  Change in appetite 0 0 0  Feeling bad or failure about yourself  0 0 0  Trouble concentrating 0 0 0  Moving slowly or fidgety/restless 0 0 0  Suicidal thoughts 0 0 0  PHQ-9 Score 0 0 0  Difficult doing work/chores Not difficult at all Not difficult at all Not difficult at all      05/29/2023   11:54 AM 04/26/2023   11:59 AM 12/20/2022    2:07 PM 10/18/2022   12:56 PM  GAD 7 : Generalized Anxiety Score  Nervous, Anxious, on Edge 0 0 0 0  Control/stop worrying 0 0 0 0  Worry too much - different things 0 0 0 0  Trouble relaxing 0 0 0 0  Restless 0 0 0 0  Easily annoyed or irritable 0 0 0 0  Afraid - awful might happen 0 0 0 0  Total GAD 7 Score 0 0 0 0  Anxiety Difficulty Not difficult at all Not difficult at all Not difficult at all Not difficult at all     Assessment & Plan:  1. Rib injury Imaging as below. Will await results to determine next steps.  - DG Ribs Unilateral W/Chest Right  2. Primary hypertension Elevated BP in office today. Reports that she does not have a monitor at home. Instructed patient to follow up with PCP earlier than planned for BP management.   The above assessment and management plan was discussed with the patient. The patient verbalized understanding of and has agreed to the management plan using shared-decision making. Patient is aware to call the clinic if they develop any new symptoms or if symptoms fail to improve or worsen. Patient is aware when to return to the clinic for a follow-up visit. Patient educated on when it is appropriate to go to the emergency department.   Return if symptoms worsen or fail to improve.  Neale Burly, DNP-FNP [facility] [address] [place], [place] [address] [phone]

## 2023-05-31 ENCOUNTER — Telehealth: Payer: Self-pay | Admitting: Family Medicine

## 2023-05-31 DIAGNOSIS — S299XXA Unspecified injury of thorax, initial encounter: Secondary | ICD-10-CM

## 2023-05-31 MED ORDER — NAPROXEN 500 MG PO TABS: 500.0000 mg | ORAL_TABLET | Freq: Two times a day (BID) | ORAL | 0 refills | Status: DC

## 2023-05-31 NOTE — Telephone Encounter (Signed)
Would like to reiterate home management. Recommend NSAIDs for pain control. Patient declined Naproxen during visit, I can send in if she would like now. Recommend "pulmonary hygiene" to prevent complications such as pneumonia. This includes coughing while holding a pillow across chest and deep breathing. Can use an incentive spirometer to assist with deep breathing.

## 2023-05-31 NOTE — Addendum Note (Signed)
Addended by: Neale Burly on: 05/31/2023 11:39 AM   Modules accepted: Orders

## 2023-05-31 NOTE — Telephone Encounter (Signed)
Patient aware and verbalizes understanding.  [facility] sent to [facility].  Aware it will be sent in for her.

## 2023-06-01 NOTE — Telephone Encounter (Signed)
Contacted patient. Notified patient. Patient verbalized understanding 

## 2023-06-01 NOTE — Telephone Encounter (Signed)
PT WANTS to know if it is safe for her to use a heating pad to get some relief. Please call back

## 2023-06-06 NOTE — Progress Notes (Signed)
No evidence of fracture or pneumonia. There is evidence of osteopenia. Recommend Vit D 800 IU daily, 1200mg  of Calcium daily. Weight bearing exercise as tolerated and can complete safely.

## 2023-06-26 ENCOUNTER — Other Ambulatory Visit: Payer: Self-pay | Admitting: Internal Medicine

## 2023-06-26 ENCOUNTER — Other Ambulatory Visit: Payer: Self-pay | Admitting: Family Medicine

## 2023-06-26 DIAGNOSIS — S299XXA Unspecified injury of thorax, initial encounter: Secondary | ICD-10-CM

## 2023-07-02 ENCOUNTER — Other Ambulatory Visit: Payer: Self-pay | Admitting: Family Medicine

## 2023-07-02 DIAGNOSIS — E782 Mixed hyperlipidemia: Secondary | ICD-10-CM

## 2023-07-26 ENCOUNTER — Encounter (HOSPITAL_COMMUNITY): Payer: Self-pay

## 2023-07-26 ENCOUNTER — Emergency Department (HOSPITAL_COMMUNITY): Payer: Medicare Other

## 2023-07-26 ENCOUNTER — Other Ambulatory Visit: Payer: Self-pay

## 2023-07-26 ENCOUNTER — Observation Stay (HOSPITAL_COMMUNITY)
Admission: EM | Admit: 2023-07-26 | Discharge: 2023-07-29 | Disposition: A | Discharge status: Home / Self Care | Payer: Medicare Other | Attending: Internal Medicine | Admitting: Internal Medicine

## 2023-07-26 DIAGNOSIS — B9620 Unspecified Escherichia coli [E. coli] as the cause of diseases classified elsewhere: Secondary | ICD-10-CM | POA: Diagnosis not present

## 2023-07-26 DIAGNOSIS — N3001 Acute cystitis with hematuria: Secondary | ICD-10-CM | POA: Diagnosis not present

## 2023-07-26 DIAGNOSIS — Z23 Encounter for immunization: Secondary | ICD-10-CM | POA: Diagnosis not present

## 2023-07-26 DIAGNOSIS — R262 Difficulty in walking, not elsewhere classified: Secondary | ICD-10-CM | POA: Diagnosis not present

## 2023-07-26 DIAGNOSIS — S22069A Unspecified fracture of T7-T8 vertebra, initial encounter for closed fracture: Secondary | ICD-10-CM | POA: Diagnosis not present

## 2023-07-26 DIAGNOSIS — S0990XA Unspecified injury of head, initial encounter: Secondary | ICD-10-CM | POA: Diagnosis not present

## 2023-07-26 DIAGNOSIS — M47816 Spondylosis without myelopathy or radiculopathy, lumbar region: Secondary | ICD-10-CM | POA: Diagnosis not present

## 2023-07-26 DIAGNOSIS — Z79899 Other long term (current) drug therapy: Secondary | ICD-10-CM | POA: Insufficient documentation

## 2023-07-26 DIAGNOSIS — E876 Hypokalemia: Secondary | ICD-10-CM | POA: Diagnosis not present

## 2023-07-26 DIAGNOSIS — E785 Hyperlipidemia, unspecified: Secondary | ICD-10-CM | POA: Diagnosis present

## 2023-07-26 DIAGNOSIS — S2241XA Multiple fractures of ribs, right side, initial encounter for closed fracture: Principal | ICD-10-CM | POA: Insufficient documentation

## 2023-07-26 DIAGNOSIS — E871 Hypo-osmolality and hyponatremia: Secondary | ICD-10-CM | POA: Diagnosis not present

## 2023-07-26 DIAGNOSIS — W19XXXA Unspecified fall, initial encounter: Principal | ICD-10-CM | POA: Diagnosis present

## 2023-07-26 DIAGNOSIS — R6889 Other general symptoms and signs: Secondary | ICD-10-CM | POA: Diagnosis not present

## 2023-07-26 DIAGNOSIS — W01190A Fall on same level from slipping, tripping and stumbling with subsequent striking against furniture, initial encounter: Secondary | ICD-10-CM | POA: Diagnosis not present

## 2023-07-26 DIAGNOSIS — I10 Essential (primary) hypertension: Secondary | ICD-10-CM | POA: Diagnosis not present

## 2023-07-26 DIAGNOSIS — R531 Weakness: Secondary | ICD-10-CM | POA: Diagnosis not present

## 2023-07-26 DIAGNOSIS — S22079A Unspecified fracture of T9-T10 vertebra, initial encounter for closed fracture: Secondary | ICD-10-CM | POA: Insufficient documentation

## 2023-07-26 DIAGNOSIS — I700 Atherosclerosis of aorta: Secondary | ICD-10-CM | POA: Diagnosis not present

## 2023-07-26 DIAGNOSIS — N390 Urinary tract infection, site not specified: Secondary | ICD-10-CM | POA: Diagnosis present

## 2023-07-26 DIAGNOSIS — Z043 Encounter for examination and observation following other accident: Secondary | ICD-10-CM | POA: Diagnosis not present

## 2023-07-26 DIAGNOSIS — R5381 Other malaise: Secondary | ICD-10-CM

## 2023-07-26 DIAGNOSIS — Z743 Need for continuous supervision: Secondary | ICD-10-CM | POA: Diagnosis not present

## 2023-07-26 DIAGNOSIS — R079 Chest pain, unspecified: Secondary | ICD-10-CM | POA: Diagnosis present

## 2023-07-26 DIAGNOSIS — K219 Gastro-esophageal reflux disease without esophagitis: Secondary | ICD-10-CM | POA: Diagnosis present

## 2023-07-26 DIAGNOSIS — I499 Cardiac arrhythmia, unspecified: Secondary | ICD-10-CM | POA: Diagnosis not present

## 2023-07-26 DIAGNOSIS — E860 Dehydration: Secondary | ICD-10-CM | POA: Insufficient documentation

## 2023-07-26 DIAGNOSIS — R9089 Other abnormal findings on diagnostic imaging of central nervous system: Secondary | ICD-10-CM | POA: Diagnosis not present

## 2023-07-26 DIAGNOSIS — I6523 Occlusion and stenosis of bilateral carotid arteries: Secondary | ICD-10-CM | POA: Diagnosis not present

## 2023-07-26 LAB — I-STAT CHEM 8, ED
BUN: 8 mg/dL (ref 8–23)
Calcium, Ion: 1.14 mmol/L — ABNORMAL LOW (ref 1.15–1.40)
Chloride: 99 mmol/L (ref 98–111)
Creatinine, Ser: 0.5 mg/dL (ref 0.44–1.00)
Glucose, Bld: 113 mg/dL — ABNORMAL HIGH (ref 70–99)
HCT: 41 % (ref 36.0–46.0)
Hemoglobin: 13.9 g/dL (ref 12.0–15.0)
Potassium: 3.8 mmol/L (ref 3.5–5.1)
Sodium: 136 mmol/L (ref 135–145)
TCO2: 28 mmol/L (ref 22–32)

## 2023-07-26 LAB — CBC
HCT: 39.8 % (ref 36.0–46.0)
Hemoglobin: 13.5 g/dL (ref 12.0–15.0)
MCH: 31.7 pg (ref 26.0–34.0)
MCHC: 33.9 g/dL (ref 30.0–36.0)
MCV: 93.4 fL (ref 80.0–100.0)
Platelets: 237 10*3/uL (ref 150–400)
RBC: 4.26 MIL/uL (ref 3.87–5.11)
RDW: 13.7 % (ref 11.5–15.5)
WBC: 13.2 10*3/uL — ABNORMAL HIGH (ref 4.0–10.5)
nRBC: 0 % (ref 0.0–0.2)

## 2023-07-26 LAB — URINALYSIS, ROUTINE W REFLEX MICROSCOPIC
Bilirubin Urine: NEGATIVE
Glucose, UA: NEGATIVE mg/dL
Hgb urine dipstick: NEGATIVE
Ketones, ur: 20 mg/dL — AB
Nitrite: POSITIVE — AB
Protein, ur: 30 mg/dL — AB
Specific Gravity, Urine: 1.012 (ref 1.005–1.030)
pH: 6 (ref 5.0–8.0)

## 2023-07-26 MED ORDER — CIPROFLOXACIN HCL 250 MG PO TABS: 250.0000 mg | ORAL_TABLET | Freq: Two times a day (BID) | ORAL | 0 refills | Status: DC

## 2023-07-26 MED ORDER — CIPROFLOXACIN HCL 250 MG PO TABS: 250.0000 mg | ORAL_TABLET | Freq: Once | ORAL | Status: DC

## 2023-07-26 MED ORDER — SODIUM CHLORIDE 0.9 % IV SOLN
1.0000 g | INTRAVENOUS | Status: DC
  Administered 2023-07-26 – 2023-07-29 (×3): 1 g via INTRAVENOUS
  Filled 2023-07-26 (×3): qty 10

## 2023-07-26 NOTE — ED Triage Notes (Signed)
Pt BIB EMS after having a unwitnessed fall this evening. Pt was sitting in a chair and then fell in the floor from the chair. Per pt, she was on the floor for about an hour. Pt endorses hitting her head. Pt denies neck pain or blood thinners. Per pt, family is telling her that she when she ambulates she is leaning backwards, but feel like she is ambulating normally. Pt a&ox4.

## 2023-07-26 NOTE — ED Provider Notes (Signed)
[place] EMERGENCY DEPARTMENT AT [facility] Provider Note   CSN: [id number] Arrival date & time: 07/26/23  1818     History  Chief Complaint  Patient presents with   Kristi Sawyer    Kristi Sawyer is a 89 y.o. female.  Patient states that she fell by slipping out of a chair and hit her head.  She was unable to get up.  Patient has a history of hypertension.  The history is provided by the patient and medical records. No language interpreter was used.  Fall This is a new problem. The current episode started 12 to 24 hours ago. The problem occurs constantly. The problem has not changed since onset.Pertinent negatives include no chest pain, no abdominal pain and no headaches. Nothing aggravates the symptoms. Nothing relieves the symptoms. She has tried nothing for the symptoms. The treatment provided no relief.       Home Medications Prior to Admission medications   Medication Sig Start Date End Date Taking? Authorizing Provider  ciprofloxacin (CIPRO) 250 MG tablet Take 1 tablet (250 mg total) by mouth 2 (two) times daily. One po bid x 7 days 07/26/23  Yes Bethann Berkshire, MD  acetaminophen (TYLENOL) 325 MG tablet Take 2 tablets (650 mg total) by mouth every 6 (six) hours as needed for mild pain (or Fever >/= 101). 12/20/21   Shon Hale, MD  amLODipine (NORVASC) 5 MG tablet Take 1 tablet (5 mg total) by mouth daily. 04/18/23   Dettinger, Elige Radon, MD  Calcium Carbonate-Vit D-Min (CALTRATE PLUS PO) Take 1 tablet by mouth daily.    [provider]  Cholecalciferol (VITAMIN D) 2000 UNITS CAPS Take by mouth as directed. Take 1 tablet daily except none on saturdays and sundays    [provider]  [facility] VITAMIN C 500 MG tablet TAKE 1 TABLET (500 MG TOTAL) BY MOUTH DAILY. 12/28/22   Dettinger, Elige Radon, MD  doxycycline (VIBRA-TABS) 100 MG tablet Take 1 tablet (100 mg total) by mouth 2 (two) times daily. 1 po bid 04/26/23   Bennie Pierini, FNP  Fe Fum-FePoly-Vit C-Vit  B3 (INTEGRA) 62.5-62.5-40-3 MG CAPS TAKE 1 CAPSULE BY MOUTH EVERY DAY 06/26/23   [name], MD  GEMTESA 75 MG TABS Take 1 tablet by mouth daily. 07/14/22   [provider]  Inulin 1.5 g CHEW Chew 5 tablets by mouth daily. FIBER CHOICE    [provider]  levocetirizine (XYZAL) 5 MG tablet TAKE 1 TABLET (5 MG TOTAL) BY MOUTH AT BEDTIME AS NEEDED (ITCHING/ ALLERGIC REACTION). 06/13/22   Delynn Flavin M, DO  metoCLOPramide (REGLAN) 10 MG tablet TAKE 1 TABLET BY MOUTH EVERYDAY AT BEDTIME 05/21/23   [name], MD  Multiple Vitamins-Minerals (CENTRUM SILVER PO) Take 1 tablet by mouth daily.    [provider]  naproxen (NAPROSYN) 500 MG tablet TAKE 1 TABLET BY MOUTH 2 TIMES DAILY WITH A MEAL. 06/26/23   Junie Spencer, FNP  Omega-3 Fatty Acids (FISH OIL) 1000 MG CAPS Take 1 capsule by mouth 3 (three) times daily.     [provider]  omeprazole (PRILOSEC) 20 MG capsule PLEASE MAKE A FOLLOW UP APPOINTMENT. (TAKE1-2 CAPSULES BY MOUTH DAILY(20-40MG ) 04/16/23   Quentin Mulling R, PA-C  rosuvastatin (CRESTOR) 5 MG tablet TAKE 1 TABLET BY MOUTH ON MONDAY, WEDNESDAY, AND FRIDAY 04/18/23   Dettinger, Elige Radon, MD  triamcinolone cream (KENALOG) 0.1 % Apply 1 Application topically See admin instructions. Apply 2-2 applications daily as needed for itching  03/09/22   [provider]  zinc sulfate 220 (50 Zn) MG capsule TAKE 1 CAPSULE BY MOUTH EVERY DAY 03/07/23   Dettinger, Elige Radon, MD      Allergies    Prevnar 13 [pneumococcal 13-val conj vacc], Penicillins, Tetanus toxoids, Codeine, and Sulfonamide derivatives    Review of Systems   Review of Systems  Constitutional:  Positive for fatigue. Negative for appetite change.  HENT:  Negative for congestion, ear discharge and sinus pressure.   Eyes:  Negative for discharge.  Respiratory:  Negative for cough.   Cardiovascular:  Negative for chest pain.  Gastrointestinal:  Negative for abdominal pain and diarrhea.   Genitourinary:  Negative for frequency and hematuria.  Musculoskeletal:  Negative for back pain.  Skin:  Negative for rash.  Neurological:  Negative for seizures and headaches.  Psychiatric/Behavioral:  Negative for hallucinations.     Physical Exam Updated Vital Signs BP (!) 138/95   Pulse 85   Temp 98 F (36.7 C) (Oral)   Resp 18   Ht 5' (1.524 m)   Wt 43.1 kg   SpO2 95%   BMI 18.55 kg/m  Physical Exam Vitals and nursing note reviewed.  Constitutional:      Appearance: She is well-developed.  HENT:     Head: Normocephalic.     Nose: Nose normal.  Eyes:     General: No scleral icterus.    Conjunctiva/sclera: Conjunctivae normal.  Neck:     Thyroid: No thyromegaly.  Cardiovascular:     Rate and Rhythm: Normal rate and regular rhythm.     Heart sounds: No murmur heard.    No friction rub. No gallop.  Pulmonary:     Breath sounds: No stridor. No wheezing or rales.  Chest:     Chest wall: No tenderness.  Abdominal:     General: There is no distension.     Tenderness: There is no abdominal tenderness. There is no rebound.  Musculoskeletal:        General: Normal range of motion.     Cervical back: Neck supple.     Comments: Patient has general weakness in her legs and has an ability to walk  Lymphadenopathy:     Cervical: No cervical adenopathy.  Skin:    Findings: No erythema or rash.  Neurological:     Mental Status: She is alert and oriented to person, place, and time.     Motor: No abnormal muscle tone.     Coordination: Coordination normal.  Psychiatric:        Behavior: Behavior normal.     ED Results / Procedures / Treatments   Labs (all labs ordered are listed, but only abnormal results are displayed) Labs Reviewed  URINALYSIS, ROUTINE W REFLEX MICROSCOPIC - Abnormal; Notable for the following components:      Result Value   APPearance HAZY (*)    Ketones, ur 20 (*)    Protein, ur 30 (*)    Nitrite POSITIVE (*)    Leukocytes,Ua MODERATE (*)     Bacteria, UA FEW (*)    All other components within normal limits  URINE CULTURE    EKG None  Radiology CT Head Wo Contrast  Result Date: 07/26/2023 CLINICAL DATA:  Fall EXAM: CT HEAD WITHOUT CONTRAST CT CERVICAL SPINE WITHOUT CONTRAST TECHNIQUE: Multidetector CT imaging of the head and cervical spine was performed following the standard protocol without intravenous contrast. Multiplanar CT image reconstructions of the cervical spine were also generated. RADIATION DOSE REDUCTION:  This exam was performed according to the departmental dose-optimization program which includes automated exposure control, adjustment of the mA and/or kV according to patient size and/or use of iterative reconstruction technique. COMPARISON:  10/19/2022 FINDINGS: CT HEAD FINDINGS Brain: No mass, hemorrhage or extra-axial collection. There is generalized volume loss. There is hypoattenuation of the bilateral supratentorial white matter. Vascular: There is atherosclerotic calcification of both internal carotid arteries at the skull base. Skull: Normal. Unchanged trichilemmal cyst of the right anterior scalp. Sinuses/Orbits: Paranasal sinuses are clear. No mastoid effusion. Normal orbits. Other: None CT CERVICAL SPINE FINDINGS Alignment: Reversal of normal cervical lordosis. Skull base and vertebrae: No fracture. Soft tissues and spinal canal: No prevertebral fluid or swelling. No visible canal hematoma. Disc levels: Multilevel disc height loss with endplate remodeling. No high-grade spinal canal stenosis. Upper chest: Visualized lung apices are clear. Other: Mild carotid vascular calcifications.  Heterogeneous thyroid. IMPRESSION: 1. No acute intracranial abnormality. 2. No acute fracture or static subluxation of the cervical spine. Electronically Signed   By: Deatra Robinson M.D.   On: 07/26/2023 20:44   CT Cervical Spine Wo Contrast  Result Date: 07/26/2023 CLINICAL DATA:  Fall EXAM: CT HEAD WITHOUT CONTRAST CT CERVICAL  SPINE WITHOUT CONTRAST TECHNIQUE: Multidetector CT imaging of the head and cervical spine was performed following the standard protocol without intravenous contrast. Multiplanar CT image reconstructions of the cervical spine were also generated. RADIATION DOSE REDUCTION: This exam was performed according to the departmental dose-optimization program which includes automated exposure control, adjustment of the mA and/or kV according to patient size and/or use of iterative reconstruction technique. COMPARISON:  10/19/2022 FINDINGS: CT HEAD FINDINGS Brain: No mass, hemorrhage or extra-axial collection. There is generalized volume loss. There is hypoattenuation of the bilateral supratentorial white matter. Vascular: There is atherosclerotic calcification of both internal carotid arteries at the skull base. Skull: Normal. Unchanged trichilemmal cyst of the right anterior scalp. Sinuses/Orbits: Paranasal sinuses are clear. No mastoid effusion. Normal orbits. Other: None CT CERVICAL SPINE FINDINGS Alignment: Reversal of normal cervical lordosis. Skull base and vertebrae: No fracture. Soft tissues and spinal canal: No prevertebral fluid or swelling. No visible canal hematoma. Disc levels: Multilevel disc height loss with endplate remodeling. No high-grade spinal canal stenosis. Upper chest: Visualized lung apices are clear. Other: Mild carotid vascular calcifications.  Heterogeneous thyroid. IMPRESSION: 1. No acute intracranial abnormality. 2. No acute fracture or static subluxation of the cervical spine. Electronically Signed   By: Deatra Robinson M.D.   On: 07/26/2023 20:44   DG Pelvis 1-2 Views  Result Date: 07/26/2023 CLINICAL DATA:  Unwitnessed fall. EXAM: PELVIS - 1-2 VIEW COMPARISON:  10/29/2015. FINDINGS: There is no evidence of pelvic fracture or diastasis. No pelvic bone lesions are seen. Degenerative changes are noted in the lower lumbar spine. IMPRESSION: No acute fracture or dislocation. Electronically Signed    By: Thornell Sartorius M.D.   On: 07/26/2023 20:43   DG Chest Port 1 View  Result Date: 07/26/2023 CLINICAL DATA:  Unwitnessed fall. EXAM: PORTABLE CHEST 1 VIEW COMPARISON:  05/29/2023. FINDINGS: The heart size and mediastinal contours are within normal limits. There is atherosclerotic calcification of the aorta. Hyperinflation of the lungs is noted. Chronic interstitial thickening and apical pleural scarring is noted bilaterally. No consolidation, effusion, or pneumothorax. There fractures of the T7, T8, and T9 ribs on the right. IMPRESSION: 1. Fractures of the T7, T8, T9 ribs on the right. 2. Aortic atherosclerosis. Electronically Signed   By: Thornell Sartorius M.D.   On:  07/26/2023 20:42    Procedures Procedures    Medications Ordered in ED Medications  ciprofloxacin (CIPRO) tablet 250 mg (has no administration in time range)    ED Course/ Medical Decision Making/ A&P                                 Medical Decision Making Amount and/or Complexity of Data Reviewed Labs: ordered. Radiology: ordered.  Risk Prescription drug management. Decision regarding hospitalization.  This patient presents to the ED for concern of fall, this involves an extensive number of treatment options, and is a complaint that carries with it a high risk of complications and morbidity.  The differential diagnosis includes head injury, pelvic injury   Co morbidities that complicate the patient evaluation  Hypertension   Additional history obtained:  Additional history obtained from patient External records from outside source obtained and reviewed including hospital records   Lab Tests:  I Ordered, and personally interpreted labs.  The pertinent results include: White count 11.8   Imaging Studies ordered:  I ordered imaging studies including CT head and cervical spine along with chest x-ray and pelvis x-ray I independently visualized and interpreted imaging which showed 3 broken ribs on the  right I agree with the radiologist interpretation   Cardiac Monitoring: / EKG:  The patient was maintained on a cardiac monitor.  I personally viewed and interpreted the cardiac monitored which showed an underlying rhythm of: Normal sinus rhythm   Consultations Obtained:  I requested consultation with the hospitalist,  and discussed lab and imaging findings as well as pertinent plan - they recommend: Admit   Problem List / ED Course / Critical interventions / Medication management  Fall with broken ribs and hypertension we will give her pain I ordered medication including Cipro for possible UTI Reevaluation of the patient after these medicines showed that the patient stayed the same I have reviewed the patients home medicines and have made adjustments as needed   Social Determinants of Health:  None   Test / Admission - Considered:     Patient with urinary tract infection, 3 broken ribs and general weakness so she cannot walk.  She is admitted to medicine        Final Clinical Impression(s) / ED Diagnoses Final diagnoses:  Fall, initial encounter  Acute cystitis with hematuria  Closed fracture of multiple ribs of right side, initial encounter    Rx / DC Orders ED Discharge Orders          Ordered    ciprofloxacin (CIPRO) 250 MG tablet  2 times daily        07/26/23 2215              Bethann Berkshire, MD 07/27/23 1229

## 2023-07-27 DIAGNOSIS — E876 Hypokalemia: Secondary | ICD-10-CM | POA: Diagnosis not present

## 2023-07-27 DIAGNOSIS — K219 Gastro-esophageal reflux disease without esophagitis: Secondary | ICD-10-CM

## 2023-07-27 DIAGNOSIS — R531 Weakness: Secondary | ICD-10-CM

## 2023-07-27 DIAGNOSIS — I10 Essential (primary) hypertension: Secondary | ICD-10-CM

## 2023-07-27 DIAGNOSIS — W19XXXA Unspecified fall, initial encounter: Secondary | ICD-10-CM | POA: Diagnosis not present

## 2023-07-27 DIAGNOSIS — E782 Mixed hyperlipidemia: Secondary | ICD-10-CM | POA: Diagnosis not present

## 2023-07-27 DIAGNOSIS — S2231XA Fracture of one rib, right side, initial encounter for closed fracture: Secondary | ICD-10-CM

## 2023-07-27 LAB — COMPREHENSIVE METABOLIC PANEL
ALT: 18 U/L (ref 0–44)
ALT: 18 U/L (ref 0–44)
AST: 20 U/L (ref 15–41)
AST: 18 U/L (ref 15–41)
Albumin: 3.7 g/dL (ref 3.5–5.0)
Albumin: 3.6 g/dL (ref 3.5–5.0)
Alkaline Phosphatase: 49 U/L (ref 38–126)
Alkaline Phosphatase: 47 U/L (ref 38–126)
Anion gap: 8 (ref 5–15)
Anion gap: 9 (ref 5–15)
BUN: 9 mg/dL (ref 8–23)
BUN: 9 mg/dL (ref 8–23)
CO2: 26 mmol/L (ref 22–32)
CO2: 28 mmol/L (ref 22–32)
Calcium: 9.5 mg/dL (ref 8.9–10.3)
Calcium: 9.3 mg/dL (ref 8.9–10.3)
Chloride: 100 mmol/L (ref 98–111)
Chloride: 99 mmol/L (ref 98–111)
Creatinine, Ser: 0.46 mg/dL (ref 0.44–1.00)
Creatinine, Ser: 0.49 mg/dL (ref 0.44–1.00)
GFR, Estimated: >60 mL/min (ref >60)
GFR, Estimated: >60 mL/min (ref >60)
Glucose, Bld: 119 mg/dL — ABNORMAL HIGH (ref 70–99)
Glucose, Bld: 110 mg/dL — ABNORMAL HIGH (ref 70–99)
Potassium: 3.4 mmol/L — ABNORMAL LOW (ref 3.5–5.1)
Potassium: 3.4 mmol/L — ABNORMAL LOW (ref 3.5–5.1)
Sodium: 134 mmol/L — ABNORMAL LOW (ref 135–145)
Sodium: 136 mmol/L (ref 135–145)
Total Bilirubin: 0.7 mg/dL (ref 0.3–1.2)
Total Bilirubin: 0.7 mg/dL (ref 0.3–1.2)
Total Protein: 6.3 g/dL — ABNORMAL LOW (ref 6.5–8.1)
Total Protein: 6.3 g/dL — ABNORMAL LOW (ref 6.5–8.1)

## 2023-07-27 LAB — CBC WITH DIFFERENTIAL/PLATELET
Abs Immature Granulocytes: 0.04 10*3/uL (ref 0.00–0.07)
Basophils Absolute: 0 10*3/uL (ref 0.0–0.1)
Basophils Relative: 0 %
Eosinophils Absolute: 0 10*3/uL (ref 0.0–0.5)
Eosinophils Relative: 0 %
HCT: 39.6 % (ref 36.0–46.0)
Hemoglobin: 13.4 g/dL (ref 12.0–15.0)
Immature Granulocytes: 0 %
Lymphocytes Relative: 22 %
Lymphs Abs: 2.6 10*3/uL (ref 0.7–4.0)
MCH: 31.3 pg (ref 26.0–34.0)
MCHC: 33.8 g/dL (ref 30.0–36.0)
MCV: 92.5 fL (ref 80.0–100.0)
Monocytes Absolute: 1.2 10*3/uL — ABNORMAL HIGH (ref 0.1–1.0)
Monocytes Relative: 10 %
Neutro Abs: 7.9 10*3/uL — ABNORMAL HIGH (ref 1.7–7.7)
Neutrophils Relative %: 68 %
Platelets: 237 10*3/uL (ref 150–400)
RBC: 4.28 MIL/uL (ref 3.87–5.11)
RDW: 13.5 % (ref 11.5–15.5)
WBC: 11.8 10*3/uL — ABNORMAL HIGH (ref 4.0–10.5)
nRBC: 0 % (ref 0.0–0.2)

## 2023-07-27 LAB — VITAMIN B12: Vitamin B-12: 1250 pg/mL — ABNORMAL HIGH (ref 180–914)

## 2023-07-27 LAB — MAGNESIUM: Magnesium: 2 mg/dL (ref 1.7–2.4)

## 2023-07-27 LAB — VITAMIN D 25 HYDROXY (VIT D DEFICIENCY, FRACTURES): Vit D, 25-Hydroxy: 64.26 ng/mL (ref 30–100)

## 2023-07-27 MED ORDER — ACETAMINOPHEN 650 MG RE SUPP: 650.0000 mg | Freq: Four times a day (QID) | RECTAL | Status: DC | PRN

## 2023-07-27 MED ORDER — IPRATROPIUM-ALBUTEROL 0.5-2.5 (3) MG/3ML IN SOLN: 3.0000 mL | RESPIRATORY_TRACT | Status: DC | PRN

## 2023-07-27 MED ORDER — ONDANSETRON HCL 4 MG/2ML IJ SOLN
4.0000 mg | Freq: Four times a day (QID) | INTRAMUSCULAR | Status: DC | PRN
  Administered 2023-07-27: 4 mg via INTRAVENOUS
  Filled 2023-07-27: qty 2

## 2023-07-27 MED ORDER — PANTOPRAZOLE SODIUM 40 MG PO TBEC
40.0000 mg | DELAYED_RELEASE_TABLET | Freq: Every day | ORAL | Status: DC
  Administered 2023-07-27 – 2023-07-29 (×3): 40 mg via ORAL
  Filled 2023-07-27 (×3): qty 1

## 2023-07-27 MED ORDER — GUAIFENESIN 100 MG/5ML PO LIQD: 5.0000 mL | ORAL | Status: DC | PRN

## 2023-07-27 MED ORDER — ROSUVASTATIN CALCIUM 10 MG PO TABS
5.0000 mg | ORAL_TABLET | ORAL | Status: DC
  Administered 2023-07-27: 5 mg via ORAL
  Filled 2023-07-27: qty 1

## 2023-07-27 MED ORDER — OXYCODONE HCL 5 MG PO TABS: 5.0000 mg | ORAL_TABLET | ORAL | Status: DC | PRN

## 2023-07-27 MED ORDER — POTASSIUM CHLORIDE 10 MEQ/100ML IV SOLN
10.0000 meq | INTRAVENOUS | Status: AC
  Administered 2023-07-27 (×2): 10 meq via INTRAVENOUS
  Filled 2023-07-27 (×2): qty 100

## 2023-07-27 MED ORDER — TRAZODONE HCL 50 MG PO TABS
50.0000 mg | ORAL_TABLET | Freq: Every evening | ORAL | Status: DC | PRN
  Filled 2023-07-27: qty 1

## 2023-07-27 MED ORDER — ONDANSETRON HCL 4 MG PO TABS: 4.0000 mg | ORAL_TABLET | Freq: Four times a day (QID) | ORAL | Status: DC | PRN

## 2023-07-27 MED ORDER — SODIUM CHLORIDE 0.9 % IV SOLN: INTRAVENOUS | Status: AC

## 2023-07-27 MED ORDER — HYDRALAZINE HCL 20 MG/ML IJ SOLN: 10.0000 mg | INTRAMUSCULAR | Status: DC | PRN

## 2023-07-27 MED ORDER — HEPARIN SODIUM (PORCINE) 5000 UNIT/ML IJ SOLN
5000.0000 [IU] | Freq: Three times a day (TID) | INTRAMUSCULAR | Status: DC
  Administered 2023-07-27 – 2023-07-29 (×7): 5000 [IU] via SUBCUTANEOUS
  Filled 2023-07-27 (×7): qty 1

## 2023-07-27 MED ORDER — LIDOCAINE 5 % EX PTCH
1.0000 | MEDICATED_PATCH | CUTANEOUS | Status: DC
  Administered 2023-07-27 – 2023-07-28 (×2): 1 via TRANSDERMAL
  Filled 2023-07-27 (×2): qty 1

## 2023-07-27 MED ORDER — INFLUENZA VAC A&B SURF ANT ADJ 0.5 ML IM SUSY
0.5000 mL | PREFILLED_SYRINGE | INTRAMUSCULAR | Status: AC
  Administered 2023-07-28: 0.5 mL via INTRAMUSCULAR
  Filled 2023-07-27: qty 0.5

## 2023-07-27 MED ORDER — ACETAMINOPHEN 325 MG PO TABS: 650.0000 mg | ORAL_TABLET | Freq: Four times a day (QID) | ORAL | Status: DC | PRN

## 2023-07-27 MED ORDER — AMLODIPINE BESYLATE 5 MG PO TABS
5.0000 mg | ORAL_TABLET | Freq: Every day | ORAL | Status: DC
  Administered 2023-07-27 – 2023-07-29 (×3): 5 mg via ORAL
  Filled 2023-07-27 (×3): qty 1

## 2023-07-27 MED ORDER — MIRABEGRON ER 25 MG PO TB24
25.0000 mg | ORAL_TABLET | Freq: Every day | ORAL | Status: DC
  Administered 2023-07-27 – 2023-07-29 (×3): 25 mg via ORAL
  Filled 2023-07-27 (×3): qty 1

## 2023-07-27 MED ORDER — SENNOSIDES-DOCUSATE SODIUM 8.6-50 MG PO TABS: 1.0000 | ORAL_TABLET | Freq: Every evening | ORAL | Status: DC | PRN

## 2023-07-27 MED ORDER — POLYETHYLENE GLYCOL 3350 17 G PO PACK: 17.0000 g | PACK | Freq: Two times a day (BID) | ORAL | Status: DC | PRN

## 2023-07-27 NOTE — ED Notes (Signed)
Pt assisted to the [facility].

## 2023-07-27 NOTE — Evaluation (Signed)
Physical Therapy Evaluation Patient Details Name: Kristi Sawyer MRN: [id number] DOB: Feb 01, 1926 Today's Date: 07/27/2023  History of Present Illness  Kristi Sawyer is a 89 y.o. female with medical history significant of acid reflux, anxiety, hyperlipidemia, hypertension, and more presents the ED with a chief complaint of fall.  Patient is 89 years old and lives alone at home.  She reports she was preparing her medications when she went to spin around in her chair and accidentally slid out the bottom of the chair.  She fell on the ground and she hit her head.  She is not on blood thinners.  She had no loss of consciousness.  Workup in the ED revealed broken ribs that patient thinks happened 3 or 4 weeks ago.  She describes the pain from them is moderate but tolerable.  Patient did require help to get up off the floor she could not get up by herself.  She reports that she did not feel weak before the fall but only since the fall happened.   Clinical Impression  Patient demonstrates good return for bed mobility, transferring to/from chair and commode in bathroom with slightly labored movement without loss of balance.  Patient able to ambulate in room/hallway with slightly lordotic posture possibly due to recent thoracic spine fractures, no loss of balance and limited mostly due to c/o fatigue.  Patient tolerated sitting up in chair after therapy with visitor in room.  Patient will benefit from continued skilled physical therapy in hospital and recommended venue below to increase strength, balance, endurance for safe ADLs and gait.          If plan is discharge home, recommend the following: Help with stairs or ramp for entrance;Assistance with cooking/housework;A little help with bathing/dressing/bathroom;A little help with walking and/or transfers   Can travel by private vehicle        Equipment Recommendations None recommended by PT  Recommendations for Other Services       Functional Status  Assessment Patient has had a recent decline in their functional status and demonstrates the ability to make significant improvements in function in a reasonable and predictable amount of time.     Precautions / Restrictions Precautions Precautions: Fall Restrictions Weight Bearing Restrictions: No      Mobility  Bed Mobility Overal bed mobility: Modified Independent                  Transfers Overall transfer level: Needs assistance Equipment used: Rolling walker (2 wheels) Transfers: Sit to/from Stand, Bed to chair/wheelchair/BSC Sit to Stand: Supervision   Step pivot transfers: Supervision       General transfer comment: slightly labored movement with good return for transferring to/from chair and commode in bathroom    Ambulation/Gait Ambulation/Gait assistance: Supervision, Contact guard assist Gait Distance (Feet): 55 Feet Assistive device: Rolling walker (2 wheels) Gait Pattern/deviations: Decreased step length - right, Decreased step length - left, Decreased stride length Gait velocity: decreased     General Gait Details: slow slightly labored cadence with mild posterior leaning, required increased time for making turns, no loss of balance and limited mostly due to c/o fatigue  Stairs            Wheelchair Mobility     Tilt Bed    Modified Rankin (Stroke Patients Only)       Balance Overall balance assessment: Needs assistance Sitting-balance support: Feet supported, No upper extremity supported Sitting balance-Leahy Scale: Good Sitting balance - Comments: seated at EOB  Standing balance support: Reliant on assistive device for balance, During functional activity, Bilateral upper extremity supported Standing balance-Leahy Scale: Fair Standing balance comment: fair/good using RW                             Pertinent Vitals/Pain Pain Assessment Pain Assessment: No/denies pain    Home Living Family/patient expects to be  discharged to:: Private residence Living Arrangements: Alone Available Help at Discharge: Friend(s);Available PRN/intermittently Type of Home: House Home Access: Stairs to enter Entrance Stairs-Rails: Right;Left;Can reach both Entrance Stairs-Number of Steps: 2-3   Home Layout: One level Home Equipment: BSC/3in1;Cane - single point;Rolling Walker (2 wheels);Shower seat;Grab bars - tub/shower      Prior Function Prior Level of Function : Independent/Modified Independent             Mobility Comments: Household and short distanced community ambulator using RW, uses electric scooter in stores ADLs Comments: Independent     Extremity/Trunk Assessment   Upper Extremity Assessment Upper Extremity Assessment: Overall WFL for tasks assessed    Lower Extremity Assessment Lower Extremity Assessment: Generalized weakness    Cervical / Trunk Assessment Cervical / Trunk Assessment: Lordotic  Communication   Communication Communication: No apparent difficulties  Cognition Arousal: Alert Behavior During Therapy: WFL for tasks assessed/performed Overall Cognitive Status: Within Functional Limits for tasks assessed                                          General Comments      Exercises     Assessment/Plan    PT Assessment Patient needs continued PT services  PT Problem List Decreased strength;Decreased activity tolerance;Decreased balance;Decreased mobility       PT Treatment Interventions DME instruction;Gait training;Stair training;Functional mobility training;Therapeutic activities;Therapeutic exercise;Balance training;Patient/family education    PT Goals (Current goals can be found in the Care Plan section)  Acute Rehab PT Goals Patient Stated Goal: return home with friends to assist PT Goal Formulation: With patient Time For Goal Achievement: 07/30/23 Potential to Achieve Goals: Good    Frequency Min 3X/week     Co-evaluation                AM-PAC PT "6 Clicks" Mobility  Outcome Measure Help needed turning from your back to your side while in a flat bed without using bedrails?: None Help needed moving from lying on your back to sitting on the side of a flat bed without using bedrails?: None Help needed moving to and from a bed to a chair (including a wheelchair)?: A Little Help needed standing up from a chair using your arms (e.g., wheelchair or bedside chair)?: None Help needed to walk in hospital room?: A Little Help needed climbing 3-5 steps with a railing? : A Little 6 Click Score: 21    End of Session   Activity Tolerance: Patient tolerated treatment well;Patient limited by fatigue Patient left: in chair;with call bell/phone within reach;with family/visitor present Nurse Communication: Mobility status PT Visit Diagnosis: Unsteadiness on feet (R26.81);Other abnormalities of gait and mobility (R26.89);Muscle weakness (generalized) (M62.81)    Time: 1610-9604 PT Time Calculation (min) (ACUTE ONLY): 25 min   Charges:   PT Evaluation $PT Eval Moderate Complexity: 1 Mod PT Treatments $Therapeutic Activity: 23-37 mins PT General Charges $$ ACUTE PT VISIT: 1 Visit  2:00 PM, 07/27/23 Ocie Bob, MPT Physical Therapist with [facility] [phone] office [phone] mobile phone

## 2023-07-27 NOTE — Assessment & Plan Note (Signed)
Continue PPI ?

## 2023-07-27 NOTE — Progress Notes (Signed)
   07/27/23 1424  Spiritual Encounters  Type of Visit Initial  Care provided to: Patient  Conversation partners present during encounter Nurse  Referral source Clinical staff  Reason for visit Routine spiritual support  OnCall Visit No  Spiritual Framework  Presenting Themes Values and beliefs  Community/Connection Faith community;Friend(s)  Patient Stress Factors None identified  Family Stress Factors None identified  Interventions  Spiritual Care Interventions Made Prayer;Meaning making  Intervention Outcomes  Outcomes Connected to spiritual community;Connection to spiritual care   Chaplain responded to a consult placed by medical team, to visit Pt and provide spiritual support and prayer to Pt. Pt greeted and welcomed Chaplain and had an opportunity to talk and share with Chaplain about her life. Pt was able to share with Chaplain about her strong faith belief and how much she appreciates the care of her faith community, which considers a family more over 70 years. Pt is proud of being considered the oldest member of her church. Chaplain and Pt explored the meaning of her staying at the hospital and how the future may look like for her. Pt then asked Chaplain to pray with her. Chaplain and Pt prayed together. Chaplain offered Pt to give this office a call if she needed time to talk more. Pt was grateful for Chaplains' visit.

## 2023-07-27 NOTE — Assessment & Plan Note (Signed)
-  Potassium 3.4 -Replace and recheck

## 2023-07-27 NOTE — ED Notes (Signed)
ED TO INPATIENT HANDOFF REPORT  ED Nurse Name and Phone #: Wandra Mannan, Paramedic  S Name/Age/Gender Kristi Sawyer 89 y.o. female Room/Bed: APA14/APA14  Code Status   Code Status: Limited: Do not attempt resuscitation (DNR) -DNR-LIMITED -Do Not Intubate/DNI   Home/SNF/Other Home Patient oriented to: self, place, time, and situation Is this baseline? Yes   Triage Complete: Triage complete  Chief Complaint Generalized weakness [R53.1]  Triage Note Pt BIB EMS after having a unwitnessed fall this evening. Pt was sitting in a chair and then fell in the floor from the chair. Per pt, she was on the floor for about an hour. Pt endorses hitting her head. Pt denies neck pain or blood thinners. Per pt, family is telling her that she when she ambulates she is leaning backwards, but feel like she is ambulating normally. Pt a&ox4.    Allergies Allergies  Allergen Reactions   Prevnar 13 [Pneumococcal 13-Val Conj Vacc] Hives    Red/streaking arm   Penicillins Hives    Has patient had a PCN reaction causing immediate rash, facial/tongue/throat swelling, SOB or lightheadedness with hypotension: No Has patient had a PCN reaction causing severe rash involving mucus membranes or skin necrosis: No Has patient had a PCN reaction that required hospitalization: No Has patient had a PCN reaction occurring within the last 10 years: No If all of the above answers are "NO", then may proceed with Cephalosporin use.    Tetanus Toxoids Other (See Comments)    Area of injection was red and streaked.   Codeine Nausea Only   Sulfonamide Derivatives Nausea Only    Level of Care/Admitting Diagnosis ED Disposition     ED Disposition  Admit   Condition  --   Comment  Hospital Area: [facility] [[id number]]  Level of Care: Telemetry [5]  Covid Evaluation: Asymptomatic - no recent exposure (last 10 days) testing not required  Diagnosis: Generalized weakness [540981]  Admitting Physician:  Lilyan Gilford [[id number]]  Attending Physician: Lilyan Gilford [[id number]]          B Medical/Surgery History 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 benign tumor      A IV Location/Drains/Wounds Patient Lines/Drains/Airways Status     Active Line/Drains/Airways     Name Placement date Placement time Site Days   Peripheral IV 07/26/23 18 G Left Antecubital 07/26/23  2100  Antecubital  1  Intake/Output Last 24 hours  Intake/Output Summary (Last 24 hours) at 07/27/2023 9629 Last data filed at 07/27/2023 0458 Gross per 24 hour  Intake 148.36 ml  Output --  Net 148.36 ml    Labs/Imaging Results for orders placed or performed during the hospital encounter of 07/26/23 (from the past 48 hour(s))  Urinalysis, Routine w reflex microscopic -Urine, Clean Catch     Status: Abnormal   Collection Time: 07/26/23  6:45 PM  Result Value Ref Range   Color, Urine YELLOW YELLOW   APPearance HAZY (A) CLEAR   Specific Gravity, Urine 1.012 1.005 - 1.030   pH 6.0 5.0 - 8.0   Glucose, UA NEGATIVE NEGATIVE mg/dL   Hgb urine dipstick NEGATIVE NEGATIVE   Bilirubin Urine NEGATIVE NEGATIVE   Ketones, ur 20 (A) NEGATIVE  mg/dL   Protein, ur 30 (A) NEGATIVE mg/dL   Nitrite POSITIVE (A) NEGATIVE   Leukocytes,Ua MODERATE (A) NEGATIVE   RBC / HPF 0-5 0 - 5 RBC/hpf   WBC, UA 11-20 0 - 5 WBC/hpf   Bacteria, UA FEW (A) NONE SEEN   Squamous Epithelial / HPF 0-5 0 - 5 /HPF   Mucus PRESENT     Comment: Performed at [facility], [address] [place], [place] [address]  I-stat chem 8, ED (not at [facility], [facility] or [facility])     Status: Abnormal   Collection Time: 07/26/23 10:33 PM  Result Value Ref Range   Sodium 136 135 - 145 mmol/L   Potassium 3.8 3.5 - 5.1 mmol/L   Chloride 99 98 - 111 mmol/L   BUN 8 8 - 23 mg/dL   Creatinine, Ser 3.24 0.44 - 1.00 mg/dL   Glucose, Bld 401 (H) 70 - 99 mg/dL    Comment: Glucose reference range applies only to samples taken after fasting for at least 8 hours.   Calcium, Ion 1.14 (L) 1.15 - 1.40 mmol/L   TCO2 28 22 - 32 mmol/L   Hemoglobin 13.9 12.0 - 15.0 g/dL   HCT 02.7 25.3 - 66.4 %  CBC     Status: Abnormal   Collection Time: 07/26/23 11:30 PM  Result Value Ref Range   WBC 13.2 (H) 4.0 - 10.5 K/uL   RBC 4.26 3.87 - 5.11 MIL/uL   Hemoglobin 13.5 12.0 - 15.0 g/dL   HCT 40.3 47.4 - 25.9 %   MCV 93.4 80.0 - 100.0 fL   MCH 31.7 26.0 - 34.0 pg   MCHC 33.9 30.0 - 36.0 g/dL   RDW 56.3 87.5 - 64.3 %   Platelets 237 150 - 400 K/uL   nRBC 0.0 0.0 - 0.2 %    Comment: Performed at [facility], [address] [place], [place] [address]  Comprehensive metabolic panel     Status: Abnormal   Collection Time: 07/26/23 11:30 PM  Result Value Ref Range   Sodium 134 (L) 135 - 145 mmol/L   Potassium 3.4 (L) 3.5 - 5.1 mmol/L   Chloride 100 98 - 111 mmol/L   CO2 26 22 - 32 mmol/L   Glucose, Bld 119 (H) 70 - 99 mg/dL    Comment: Glucose reference range applies only to samples taken after fasting for at least 8 hours.   BUN 9 8 - 23 mg/dL   Creatinine, Ser 8.84 0.44 - 1.00 mg/dL   Calcium 9.5 8.9 - 16.6 mg/dL   Total Protein 6.3 (L) 6.5 - 8.1 g/dL   Albumin 3.7 3.5 - 5.0 g/dL   AST 20 15 -  41 U/L   ALT 18 0 - 44 U/L   Alkaline Phosphatase 49 38 - 126 U/L   Total Bilirubin 0.7 0.3 - 1.2 mg/dL   GFR, Estimated >63 >87 mL/min    Comment: (NOTE) Calculated using the CKD-EPI Creatinine Equation (2021)    Anion gap 8 5 - 15    Comment: Performed at [facility], [address], [place], [place] [address]  Comprehensive metabolic panel     Status: Abnormal   Collection Time: 07/27/23  2:43 AM  Result Value Ref Range   Sodium 136 135 - 145 mmol/L   Potassium 3.4 (L) 3.5 - 5.1 mmol/L   Chloride 99 98 - 111 mmol/L   CO2 28 22 - 32 mmol/L   Glucose, Bld 110 (H) 70 - 99 mg/dL    Comment: Glucose reference range applies only to samples taken after fasting for at least 8 hours.   BUN 9 8 - 23 mg/dL   Creatinine, Ser 2.95 0.44 - 1.00 mg/dL   Calcium 9.3 8.9 - 18.8 mg/dL   Total Protein 6.3 (L) 6.5 - 8.1 g/dL   Albumin 3.6 3.5 - 5.0 g/dL   AST 18 15 - 41 U/L   ALT 18 0 - 44 U/L   Alkaline Phosphatase 47 38 - 126 U/L   Total Bilirubin 0.7 0.3 - 1.2 mg/dL   GFR, Estimated >41 >66 mL/min    Comment: (NOTE) Calculated using the CKD-EPI Creatinine Equation (2021)    Anion gap 9 5 - 15    Comment: Performed at [facility], [address] [place], [place] [address]  Magnesium     Status: None   Collection Time: 07/27/23  2:43 AM  Result Value Ref Range   Magnesium 2.0 1.7 - 2.4 mg/dL    Comment: Performed at [facility], [address] [place], [place] [address]  CBC with Differential/Platelet     Status: Abnormal   Collection Time: 07/27/23  2:43 AM  Result Value Ref Range   WBC 11.8 (H) 4.0 - 10.5 K/uL   RBC 4.28 3.87 - 5.11 MIL/uL   Hemoglobin 13.4 12.0 - 15.0 g/dL   HCT 32.3 55.7 - 32.2 %   MCV 92.5 80.0 - 100.0 fL   MCH 31.3 26.0 - 34.0 pg   MCHC 33.8 30.0 - 36.0 g/dL   RDW 02.5 42.7 - 06.2 %   Platelets 237 150 - 400 K/uL   nRBC 0.0 0.0 - 0.2 %   Neutrophils Relative % 68 %   Neutro Abs 7.9 (H) 1.7 - 7.7 K/uL   Lymphocytes Relative 22 %   Lymphs Abs 2.6 0.7 - 4.0  K/uL   Monocytes Relative 10 %   Monocytes Absolute 1.2 (H) 0.1 - 1.0 K/uL   Eosinophils Relative 0 %   Eosinophils Absolute 0.0 0.0 - 0.5 K/uL   Basophils Relative 0 %   Basophils Absolute 0.0 0.0 - 0.1 K/uL   Immature Granulocytes 0 %   Abs Immature Granulocytes 0.04 0.00 - 0.07 K/uL    Comment: Performed at [facility], [address], [place], [place] [address]   CT Head Wo Contrast  Result Date: 07/26/2023 CLINICAL DATA:  Fall EXAM: CT HEAD WITHOUT CONTRAST CT CERVICAL SPINE WITHOUT CONTRAST TECHNIQUE: Multidetector CT imaging of the head and cervical spine was performed following the standard protocol without intravenous contrast. Multiplanar CT image reconstructions of the cervical spine were also generated. RADIATION DOSE REDUCTION: This exam was performed according to the departmental dose-optimization  program which includes automated exposure control, adjustment of the mA and/or kV according to patient size and/or use of iterative reconstruction technique. COMPARISON:  10/19/2022 FINDINGS: CT HEAD FINDINGS Brain: No mass, hemorrhage or extra-axial collection. There is generalized volume loss. There is hypoattenuation of the bilateral supratentorial white matter. Vascular: There is atherosclerotic calcification of both internal carotid arteries at the skull base. Skull: Normal. Unchanged trichilemmal cyst of the right anterior scalp. Sinuses/Orbits: Paranasal sinuses are clear. No mastoid effusion. Normal orbits. Other: None CT CERVICAL SPINE FINDINGS Alignment: Reversal of normal cervical lordosis. Skull base and vertebrae: No fracture. Soft tissues and spinal canal: No prevertebral fluid or swelling. No visible canal hematoma. Disc levels: Multilevel disc height loss with endplate remodeling. No high-grade spinal canal stenosis. Upper chest: Visualized lung apices are clear. Other: Mild carotid vascular calcifications.  Heterogeneous thyroid. IMPRESSION: 1. No acute intracranial abnormality.  2. No acute fracture or static subluxation of the cervical spine. Electronically Signed   By: Deatra Robinson M.D.   On: 07/26/2023 20:44   CT Cervical Spine Wo Contrast  Result Date: 07/26/2023 CLINICAL DATA:  Fall EXAM: CT HEAD WITHOUT CONTRAST CT CERVICAL SPINE WITHOUT CONTRAST TECHNIQUE: Multidetector CT imaging of the head and cervical spine was performed following the standard protocol without intravenous contrast. Multiplanar CT image reconstructions of the cervical spine were also generated. RADIATION DOSE REDUCTION: This exam was performed according to the departmental dose-optimization program which includes automated exposure control, adjustment of the mA and/or kV according to patient size and/or use of iterative reconstruction technique. COMPARISON:  10/19/2022 FINDINGS: CT HEAD FINDINGS Brain: No mass, hemorrhage or extra-axial collection. There is generalized volume loss. There is hypoattenuation of the bilateral supratentorial white matter. Vascular: There is atherosclerotic calcification of both internal carotid arteries at the skull base. Skull: Normal. Unchanged trichilemmal cyst of the right anterior scalp. Sinuses/Orbits: Paranasal sinuses are clear. No mastoid effusion. Normal orbits. Other: None CT CERVICAL SPINE FINDINGS Alignment: Reversal of normal cervical lordosis. Skull base and vertebrae: No fracture. Soft tissues and spinal canal: No prevertebral fluid or swelling. No visible canal hematoma. Disc levels: Multilevel disc height loss with endplate remodeling. No high-grade spinal canal stenosis. Upper chest: Visualized lung apices are clear. Other: Mild carotid vascular calcifications.  Heterogeneous thyroid. IMPRESSION: 1. No acute intracranial abnormality. 2. No acute fracture or static subluxation of the cervical spine. Electronically Signed   By: Deatra Robinson M.D.   On: 07/26/2023 20:44   DG Pelvis 1-2 Views  Result Date: 07/26/2023 CLINICAL DATA:  Unwitnessed fall. EXAM:  PELVIS - 1-2 VIEW COMPARISON:  10/29/2015. FINDINGS: There is no evidence of pelvic fracture or diastasis. No pelvic bone lesions are seen. Degenerative changes are noted in the lower lumbar spine. IMPRESSION: No acute fracture or dislocation. Electronically Signed   By: Thornell Sartorius M.D.   On: 07/26/2023 20:43   DG Chest Port 1 View  Result Date: 07/26/2023 CLINICAL DATA:  Unwitnessed fall. EXAM: PORTABLE CHEST 1 VIEW COMPARISON:  05/29/2023. FINDINGS: The heart size and mediastinal contours are within normal limits. There is atherosclerotic calcification of the aorta. Hyperinflation of the lungs is noted. Chronic interstitial thickening and apical pleural scarring is noted bilaterally. No consolidation, effusion, or pneumothorax. There fractures of the T7, T8, and T9 ribs on the right. IMPRESSION: 1. Fractures of the T7, T8, T9 ribs on the right. 2. Aortic atherosclerosis. Electronically Signed   By: Thornell Sartorius M.D.   On: 07/26/2023 20:42    Pending Labs [employer] (  From admission, onward)     Start     Ordered   07/28/23 0500  Basic metabolic panel  Daily,   R      07/27/23 0937   07/28/23 0500  CBC  Daily,   R      07/27/23 0937   07/28/23 0500  Magnesium  Daily,   R      07/27/23 0937   07/27/23 0937  Vitamin B12  Once,   R        07/27/23 0937   07/27/23 0937  VITAMIN D 25 Hydroxy (Vit-D Deficiency, Fractures)  Once,   R        07/27/23 0937   07/26/23 2206  Urine Culture  Once,   URGENT       Question:  Indication  Answer:  Dysuria   07/26/23 2205            Vitals/Pain Today's Vitals   07/27/23 0200 07/27/23 0400 07/27/23 0500 07/27/23 0600  BP: 138/82 135/79 125/74 (!) 148/75  Pulse: 69 68 67 74  Resp: 18 18 18 18   Temp:  98 F (36.7 C)    TempSrc:  Oral    SpO2: 93% 94% 92% 94%  Weight:      Height:      PainSc:        Isolation Precautions No active isolations  Medications Medications  cefTRIAXone (ROCEPHIN) 1 g in sodium chloride 0.9 % 100 mL  IVPB (0 g Intravenous Stopped 07/27/23 0012)  amLODipine (NORVASC) tablet 5 mg (has no administration in time range)  rosuvastatin (CRESTOR) tablet 5 mg (has no administration in time range)  pantoprazole (PROTONIX) EC tablet 40 mg (has no administration in time range)  mirabegron ER (MYRBETRIQ) tablet 25 mg (has no administration in time range)  heparin injection 5,000 Units (5,000 Units Subcutaneous Given 07/27/23 0551)  0.9 %  sodium chloride infusion (0 mLs Intravenous Stopped 07/27/23 0458)  acetaminophen (TYLENOL) tablet 650 mg (has no administration in time range)    Or  acetaminophen (TYLENOL) suppository 650 mg (has no administration in time range)  oxyCODONE (Oxy IR/ROXICODONE) immediate release tablet 5 mg (has no administration in time range)  ondansetron (ZOFRAN) tablet 4 mg ( Oral See Alternative 07/27/23 0710)    Or  ondansetron (ZOFRAN) injection 4 mg (4 mg Intravenous Given 07/27/23 0710)  potassium chloride 10 mEq in 100 mL IVPB (10 mEq Intravenous New Bag/Given 07/27/23 0551)  ipratropium-albuterol (DUONEB) 0.5-2.5 (3) MG/3ML nebulizer solution 3 mL (has no administration in time range)  senna-docusate (Senokot-S) tablet 1 tablet (has no administration in time range)  guaiFENesin (ROBITUSSIN) 100 MG/5ML liquid 5 mL (has no administration in time range)  traZODone (DESYREL) tablet 50 mg (has no administration in time range)  hydrALAZINE (APRESOLINE) injection 10 mg (has no administration in time range)  polyethylene glycol (MIRALAX / GLYCOLAX) packet 17 g (has no administration in time range)    Mobility walks with person assist     Focused Assessments Cardiac Assessment Handoff:    Lab Results  Component Value Date   CKTOTAL 188 05/05/2022   TROPONINI <0.03 10/26/2017   Lab Results  Component Value Date   DDIMER 0.53 (H) 12/20/2021   Does the Patient currently have chest pain? No    R Recommendations: See Admitting Provider Note  Report given to:    Additional Notes: 18ga LAC. Ambulatory with 1 person assist. Continent. Aox4. Right side rib Fx

## 2023-07-27 NOTE — Assessment & Plan Note (Signed)
Continue Crestor

## 2023-07-27 NOTE — Assessment & Plan Note (Signed)
-   Reports normal appetite - Possible UTI may be contributing - Consult PT eval and treat - Monitor electrolytes and replace as indicated -

## 2023-07-27 NOTE — Progress Notes (Signed)
PROGRESS NOTE    Kristi Sawyer  BTD:[id number] DOB: Oct 19, 1926 DOA: 07/26/2023 PCP: Dettinger, Elige Radon, MD   Brief Narrative:  89 year old with history of GERD, anxiety, HLD, HTN, celiac's comes to the hospital after a fall.  Lives alone at home.  She is not on any anticoagulation.  Workup in the ED revealed T7-T9 fracture of right ribs.   Assessment & Plan:  Active Problems:   Hyperlipidemia   GERD (gastroesophageal reflux disease)   Hypertension   UTI (urinary tract infection)   Falls   Generalized weakness   Hypokalemia   Hypokalemia As needed repletion.  Check magnesium, phosphorus, calcium.   Generalized weakness with mechanical fall Right-sided T7-T9 fracture. At this time we will provide supportive care, pain control.  PT/OT.  Will check vitamin D.  Incentive spirometer. Trauma workup including CT head, cervical spine and pelvic x-ray are negative.   UTI (urinary tract infection) Urine culture.  Empiric Rocephin.   Hypertension Norvasc.  IV as needed   GERD (gastroesophageal reflux disease) Daily PPI   Hyperlipidemia Crestor  DVT prophylaxis: Subcu heparin Code Status: Full code Family Communication:   Continue hospital stay for pain control, PT/OT    Subjective:  Seen and examined at bedside.  Reporting of right sided chest discomfort as expected over her broken/fractured ribs.  Examination:  General exam: Appears calm and comfortable, elderly frail Respiratory system: Clear to auscultation. Respiratory effort normal. Cardiovascular system: S1 & S2 heard, RRR. No JVD, murmurs, rubs, gallops or clicks. No pedal edema. Gastrointestinal system: Abdomen is nondistended, soft and nontender. No organomegaly or masses felt. Normal bowel sounds heard. Central nervous system: Alert and oriented. No focal neurological deficits. Extremities: Symmetric 5 x 5 power. Skin: No rashes, lesions or ulcers Psychiatry: Judgement and insight appear normal. Mood &  affect appropriate.      Diet Orders (From admission, onward)     Start     Ordered   07/27/23 0235  Diet Heart Room service appropriate? Yes; Fluid consistency: Thin  Diet effective now       Question Answer Comment  Room service appropriate? Yes   Fluid consistency: Thin      07/27/23 0234            Objective: Vitals:   07/27/23 0200 07/27/23 0400 07/27/23 0500 07/27/23 0600  BP: 138/82 135/79 125/74 (!) 148/75  Pulse: 69 68 67 74  Resp: 18 18 18 18   Temp:  98 F (36.7 C)    TempSrc:  Oral    SpO2: 93% 94% 92% 94%  Weight:      Height:        Intake/Output Summary (Last 24 hours) at 07/27/2023 0932 Last data filed at 07/27/2023 0458 Gross per 24 hour  Intake 148.36 ml  Output --  Net 148.36 ml   Filed Weights   07/26/23 1834  Weight: 43.1 kg    Scheduled Meds:  amLODipine  5 mg Oral Daily   heparin  5,000 Units Subcutaneous Q8H   mirabegron ER  25 mg Oral Daily   pantoprazole  40 mg Oral Daily   rosuvastatin  5 mg Oral Q M,W,F   Continuous Infusions:  sodium chloride Stopped (07/27/23 0458)   cefTRIAXone (ROCEPHIN)  IV Stopped (07/27/23 0012)    Nutritional status     Body mass index is 18.55 kg/m.  Data Reviewed:   CBC: Recent Labs  Lab 07/26/23 2233 07/26/23 2330 07/27/23 0243  WBC  --  13.2* 11.8*  NEUTROABS  --   --  7.9*  HGB 13.9 13.5 13.4  HCT 41.0 39.8 39.6  MCV  --  93.4 92.5  PLT  --  237 237   Basic Metabolic Panel: Recent Labs  Lab 07/26/23 2233 07/26/23 2330 07/27/23 0243  NA 136 134* 136  K 3.8 3.4* 3.4*  CL 99 100 99  CO2  --  26 28  GLUCOSE 113* 119* 110*  BUN 8 9 9   CREATININE 0.50 0.46 0.49  CALCIUM  --  9.5 9.3  MG  --   --  2.0   GFR: Estimated Creatinine Clearance: 29.3 mL/min (by C-G formula based on SCr of 0.49 mg/dL). Liver Function Tests: Recent Labs  Lab 07/26/23 2330 07/27/23 0243  AST 20 18  ALT 18 18  ALKPHOS 49 47  BILITOT 0.7 0.7  PROT 6.3* 6.3*  ALBUMIN 3.7 3.6   No results  for input(s): "LIPASE", "AMYLASE" in the last 168 hours. No results for input(s): "AMMONIA" in the last 168 hours. Coagulation Profile: No results for input(s): "INR", "PROTIME" in the last 168 hours. Cardiac Enzymes: No results for input(s): "CKTOTAL", "CKMB", "CKMBINDEX", "TROPONINI" in the last 168 hours. BNP (last 3 results) No results for input(s): "PROBNP" in the last 8760 hours. HbA1C: No results for input(s): "HGBA1C" in the last 72 hours. CBG: No results for input(s): "GLUCAP" in the last 168 hours. Lipid Profile: No results for input(s): "CHOL", "HDL", "LDLCALC", "TRIG", "CHOLHDL", "LDLDIRECT" in the last 72 hours. Thyroid Function Tests: No results for input(s): "TSH", "T4TOTAL", "FREET4", "T3FREE", "THYROIDAB" in the last 72 hours. Anemia Panel: No results for input(s): "VITAMINB12", "FOLATE", "FERRITIN", "TIBC", "IRON", "RETICCTPCT" in the last 72 hours. Sepsis Labs: No results for input(s): "PROCALCITON", "LATICACIDVEN" in the last 168 hours.  No results found for this or any previous visit (from the past 240 hour(s)).       Radiology Studies: CT Head Wo Contrast  Result Date: 07/26/2023 CLINICAL DATA:  Fall EXAM: CT HEAD WITHOUT CONTRAST CT CERVICAL SPINE WITHOUT CONTRAST TECHNIQUE: Multidetector CT imaging of the head and cervical spine was performed following the standard protocol without intravenous contrast. Multiplanar CT image reconstructions of the cervical spine were also generated. RADIATION DOSE REDUCTION: This exam was performed according to the departmental dose-optimization program which includes automated exposure control, adjustment of the mA and/or kV according to patient size and/or use of iterative reconstruction technique. COMPARISON:  10/19/2022 FINDINGS: CT HEAD FINDINGS Brain: No mass, hemorrhage or extra-axial collection. There is generalized volume loss. There is hypoattenuation of the bilateral supratentorial white matter. Vascular: There is  atherosclerotic calcification of both internal carotid arteries at the skull base. Skull: Normal. Unchanged trichilemmal cyst of the right anterior scalp. Sinuses/Orbits: Paranasal sinuses are clear. No mastoid effusion. Normal orbits. Other: None CT CERVICAL SPINE FINDINGS Alignment: Reversal of normal cervical lordosis. Skull base and vertebrae: No fracture. Soft tissues and spinal canal: No prevertebral fluid or swelling. No visible canal hematoma. Disc levels: Multilevel disc height loss with endplate remodeling. No high-grade spinal canal stenosis. Upper chest: Visualized lung apices are clear. Other: Mild carotid vascular calcifications.  Heterogeneous thyroid. IMPRESSION: 1. No acute intracranial abnormality. 2. No acute fracture or static subluxation of the cervical spine. Electronically Signed   By: Deatra Robinson M.D.   On: 07/26/2023 20:44   CT Cervical Spine Wo Contrast  Result Date: 07/26/2023 CLINICAL DATA:  Fall EXAM: CT HEAD WITHOUT CONTRAST CT CERVICAL SPINE WITHOUT CONTRAST TECHNIQUE: Multidetector CT imaging of the head and cervical spine was performed following the  standard protocol without intravenous contrast. Multiplanar CT image reconstructions of the cervical spine were also generated. RADIATION DOSE REDUCTION: This exam was performed according to the departmental dose-optimization program which includes automated exposure control, adjustment of the mA and/or kV according to patient size and/or use of iterative reconstruction technique. COMPARISON:  10/19/2022 FINDINGS: CT HEAD FINDINGS Brain: No mass, hemorrhage or extra-axial collection. There is generalized volume loss. There is hypoattenuation of the bilateral supratentorial white matter. Vascular: There is atherosclerotic calcification of both internal carotid arteries at the skull base. Skull: Normal. Unchanged trichilemmal cyst of the right anterior scalp. Sinuses/Orbits: Paranasal sinuses are clear. No mastoid effusion. Normal  orbits. Other: None CT CERVICAL SPINE FINDINGS Alignment: Reversal of normal cervical lordosis. Skull base and vertebrae: No fracture. Soft tissues and spinal canal: No prevertebral fluid or swelling. No visible canal hematoma. Disc levels: Multilevel disc height loss with endplate remodeling. No high-grade spinal canal stenosis. Upper chest: Visualized lung apices are clear. Other: Mild carotid vascular calcifications.  Heterogeneous thyroid. IMPRESSION: 1. No acute intracranial abnormality. 2. No acute fracture or static subluxation of the cervical spine. Electronically Signed   By: Deatra Robinson M.D.   On: 07/26/2023 20:44   DG Pelvis 1-2 Views  Result Date: 07/26/2023 CLINICAL DATA:  Unwitnessed fall. EXAM: PELVIS - 1-2 VIEW COMPARISON:  10/29/2015. FINDINGS: There is no evidence of pelvic fracture or diastasis. No pelvic bone lesions are seen. Degenerative changes are noted in the lower lumbar spine. IMPRESSION: No acute fracture or dislocation. Electronically Signed   By: Thornell Sartorius M.D.   On: 07/26/2023 20:43   DG Chest Port 1 View  Result Date: 07/26/2023 CLINICAL DATA:  Unwitnessed fall. EXAM: PORTABLE CHEST 1 VIEW COMPARISON:  05/29/2023. FINDINGS: The heart size and mediastinal contours are within normal limits. There is atherosclerotic calcification of the aorta. Hyperinflation of the lungs is noted. Chronic interstitial thickening and apical pleural scarring is noted bilaterally. No consolidation, effusion, or pneumothorax. There fractures of the T7, T8, and T9 ribs on the right. IMPRESSION: 1. Fractures of the T7, T8, T9 ribs on the right. 2. Aortic atherosclerosis. Electronically Signed   By: Thornell Sartorius M.D.   On: 07/26/2023 20:42           LOS: 0 days   Time spent= 35 mins    Miguel Rota, MD [facility]  If 7PM-7AM, please contact night-coverage  07/27/2023, 9:32 AM

## 2023-07-27 NOTE — Plan of Care (Signed)
  Problem: Acute Rehab PT Goals(only PT should resolve) Goal: Pt Will Go Supine/Side To Sit Outcome: Progressing Flowsheets (Taken 07/27/2023 1401) Pt will go Supine/Side to Sit:  Independently  with modified independence Goal: Patient Will Transfer Sit To/From Stand Outcome: Progressing Flowsheets (Taken 07/27/2023 1401) Patient will transfer sit to/from stand:  with modified independence  with supervision Goal: Pt Will Transfer Bed To Chair/Chair To Bed Outcome: Progressing Flowsheets (Taken 07/27/2023 1401) Pt will Transfer Bed to Chair/Chair to Bed:  with modified independence  with supervision Goal: Pt Will Ambulate Outcome: Progressing Flowsheets (Taken 07/27/2023 1401) Pt will Ambulate:  100 feet  with rolling walker  with modified independence  with supervision   2:02 PM, 07/27/23 Ocie Bob, MPT Physical Therapist with [facility] 336 [phone] office [phone] mobile phone

## 2023-07-27 NOTE — H&P (Signed)
History and Physical    Patient: Kristi Sawyer ZOX:[id number] DOB: [birth year] DOA: 07/26/2023 DOS: the patient was seen and examined on 07/27/2023 PCP: Dettinger, Elige Radon, MD  Patient coming from: Home  Chief Complaint:  Chief Complaint  Patient presents with   Fall   HPI: LAZARIA KUDRICK is a 89 y.o. female with medical history significant of acid reflux, anxiety, hyperlipidemia, hypertension, and more presents the ED with a chief complaint of fall.  Patient is 89 years old and lives alone at home.  She reports she was preparing her medications when she went to spin around in her chair and accidentally slid out the bottom of the chair.  She fell on the ground and she hit her head.  She is not on blood thinners.  She had no loss of consciousness.  Workup in the ED revealed broken ribs that patient thinks happened 3 or 4 weeks ago.  She describes the pain from them is moderate but tolerable.  Patient did require help to get up off the floor she could not get up by herself.  She reports that she did not feel weak before the fall but only since the fall happened.  Patient reports that she would want to have a natural passing with her time came Review of Systems: As mentioned in the history of present illness. All other systems reviewed and are negative. 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 benign tumor    Social History:  reports that she has never smoked. She has never used smokeless tobacco. She reports that she does not drink alcohol and does not use drugs.  Allergies  Allergen Reactions   Prevnar 13 [Pneumococcal 13-Val Conj Vacc] Hives    Red/streaking arm   Penicillins Hives    Has patient had a PCN reaction causing immediate rash, facial/tongue/throat swelling, SOB or lightheadedness with hypotension: No Has patient had a PCN reaction causing severe rash involving mucus membranes or skin necrosis: No Has patient had a PCN reaction that required hospitalization: No Has patient had a PCN reaction occurring within the last 10 years: No If all of the above answers are "NO", then may proceed with Cephalosporin use.    Tetanus Toxoids Other (See Comments)    Area of injection was red and streaked.   Codeine Nausea Only   Sulfonamide Derivatives Nausea Only    Family History  Problem Relation Age of Onset   Healthy Mother  Lung cancer Father    Colon cancer Neg Hx    Stomach cancer Neg Hx    Esophageal cancer Neg Hx     Prior to Admission medications   Medication Sig Start Date End Date Taking? Authorizing Provider  acetaminophen (TYLENOL) 325 MG tablet Take 2 tablets (650 mg total) by mouth every 6 (six) hours as needed for mild pain (or Fever >/= 101). 12/20/21   Shon Hale, MD  amLODipine (NORVASC) 5 MG tablet Take 1 tablet (5 mg total) by mouth daily. 04/18/23   Dettinger, Elige Radon, MD  Calcium Carbonate-Vit D-Min (CALTRATE PLUS PO) Take 1 tablet by mouth daily.    [provider]  Cholecalciferol (VITAMIN D) 2000 UNITS CAPS Take by mouth as directed. Take 1 tablet daily except none on saturdays and sundays    [provider]  [facility] VITAMIN C 500 MG tablet TAKE 1 TABLET (500 MG TOTAL) BY  MOUTH DAILY. 12/28/22   Dettinger, Elige Radon, MD  doxycycline (VIBRA-TABS) 100 MG tablet Take 1 tablet (100 mg total) by mouth 2 (two) times daily. 1 po bid 04/26/23   Bennie Pierini, FNP  Fe Fum-FePoly-Vit C-Vit B3 (INTEGRA) 62.5-62.5-40-3 MG CAPS TAKE 1 CAPSULE BY MOUTH EVERY DAY 06/26/23   [name], MD  GEMTESA 75 MG TABS Take 1 tablet by mouth daily. 07/14/22   [provider]  Inulin 1.5 g CHEW Chew 5 tablets by mouth daily. FIBER CHOICE    [provider]  levocetirizine (XYZAL) 5 MG tablet TAKE 1 TABLET (5 MG TOTAL) BY MOUTH AT BEDTIME AS NEEDED (ITCHING/ ALLERGIC REACTION). 06/13/22   Delynn Flavin M, DO  metoCLOPramide (REGLAN) 10 MG tablet TAKE 1 TABLET BY MOUTH EVERYDAY AT BEDTIME 05/21/23   [name], MD  Multiple Vitamins-Minerals (CENTRUM SILVER PO) Take 1 tablet by mouth daily.    [provider]  naproxen (NAPROSYN) 500 MG tablet TAKE 1 TABLET BY MOUTH 2 TIMES DAILY WITH A MEAL. 06/26/23   Junie Spencer, FNP  Omega-3 Fatty Acids (FISH OIL) 1000 MG CAPS Take 1 capsule by mouth 3 (three) times daily.     [provider]  omeprazole (PRILOSEC) 20 MG capsule PLEASE MAKE A FOLLOW UP APPOINTMENT. (TAKE1-2 CAPSULES BY MOUTH DAILY(20-40MG ) 04/16/23   Quentin Mulling R, PA-C  rosuvastatin (CRESTOR) 5 MG tablet TAKE 1 TABLET BY MOUTH ON MONDAY, WEDNESDAY, AND FRIDAY 04/18/23   Dettinger, Elige Radon, MD  triamcinolone cream (KENALOG) 0.1 % Apply 1 Application topically See admin instructions. Apply 2-2 applications daily as needed for itching 03/09/22   [provider]  zinc sulfate 220 (50 Zn) MG capsule TAKE 1 CAPSULE BY MOUTH EVERY DAY 03/07/23   Dettinger, Elige Radon, MD    Physical Exam: Vitals:   07/26/23 2230 07/27/23 0012 07/27/23 0200 07/27/23 0400  BP: (!) 168/83  138/82 135/79  Pulse: 91 76 69 68  Resp:  15 18 18   Temp:  98.2 F (36.8 C)  98 F (36.7 C)  TempSrc:  Oral  Oral  SpO2: 96% 98% 93% 94%  Weight:      Height:        1.  General: Patient lying supine in bed,  no acute distress   2. Psychiatric: Alert and oriented x 3, mood and behavior normal for situation, very pleasant and cooperative with exam   3. Neurologic: Speech and language are normal, face is symmetric, moves all 4 extremities voluntarily, at baseline without acute deficits on limited exam  4. HEENMT:  Head is atraumatic, normocephalic, pupils reactive to light, neck is supple, trachea is midline, mucous membranes are moist   5. Respiratory : Lungs are clear to auscultation bilaterally without wheezing, rhonchi, rales, no cyanosis, no increase in work of breathing or accessory muscle use   6. Cardiovascular : Heart rate normal, rhythm is regular, no murmurs, rubs or gallops, no peripheral edema, peripheral pulses palpated   7. Gastrointestinal:  Abdomen is soft, nondistended, nontender to palpation bowel sounds active, no masses or organomegaly palpated   8. Skin:  Skin is warm, dry and intact without rashes, acute lesions, or ulcers on limited exam   9.Musculoskeletal:  No acute deformities or trauma, no asymmetry in tone, no peripheral edema, peripheral pulses palpated, no tenderness to palpation in the extremities  Data Reviewed: In the ED Temp 98, heart rate 76-88, respiratory rate 18-26, blood pressure 132/79-167/100, satting 95% Patient has a leukocytosis with a white blood cell count of 13.2 Potassium 3.4, replace and recheck Admission requested generalized weakness and PT evaluation  Assessment and Plan: Hypokalemia - Potassium 3.4 - Replace and recheck  Generalized weakness - Reports normal appetite - Possible UTI may be contributing - Consult PT eval and treat - Monitor electrolytes and replace as indicated -  Falls - Mechanical fall - Reported weakness since fall - PT eval and treat - Broken ribs on CT the patient thinks are actually from 3-4 weeks ago - Incentive spirometer - Pain control - Continue  to monitor  UTI (urinary tract infection) - Generalized weakness - Urinary frequency - UA indicative of UTI - Urine culture pending - Continue Rocephin  Hypertension - Continue Norvasc  GERD (gastroesophageal reflux disease) - Continue PPI  Hyperlipidemia - Continue Crestor      Advance Care Planning:   Code Status: Limited: Do not attempt resuscitation (DNR) -DNR-LIMITED -Do Not Intubate/DNI   Consults:  Family Communication: None  Severity of Illness: The appropriate patient status for this patient is OBSERVATION. Observation status is judged to be reasonable and necessary in order to provide the required intensity of service to ensure the patient's safety. The patient's presenting symptoms, physical exam findings, and initial radiographic and laboratory data in the context of their medical condition is felt to place them at decreased risk for further clinical deterioration. Furthermore, it is anticipated that the patient will be medically stable for discharge from the hospital within 2 midnights of admission.   Author: Lilyan Gilford, DO 07/27/2023 5:02 AM  For on call review [email or web address].

## 2023-07-27 NOTE — ED Notes (Signed)
Pt at full breakfast tray.

## 2023-07-27 NOTE — Progress Notes (Signed)
Patient arrived to 335 from ED. GCS 15, room air, no pain. Skin assessment completed. Bed in lowest position, bed alarm on, call bell in reach and educated how to use.

## 2023-07-27 NOTE — Assessment & Plan Note (Signed)
Continue Norvasc

## 2023-07-27 NOTE — Care Management Obs Status (Signed)
MEDICARE OBSERVATION STATUS NOTIFICATION   Patient Details  Name: CLAIR NICKLAS MRN: [id number] Date of Birth: Jul 30, 1926   Medicare Observation Status Notification Given:  Yes    Corey Harold 07/27/2023, 3:51 PM

## 2023-07-27 NOTE — TOC Initial Note (Addendum)
Transition of Care [facility]) - Initial/Assessment Note    Patient Details  Name: Kristi Sawyer MRN: [id number] Date of Birth: [birth year]  Transition of Care [facility]) CM/SW Contact:    Villa Herb, LCSWA Phone Number: 07/27/2023, 2:00 PM  Clinical Narrative:                 CSW spoke with pt at bedside to complete assessment. Pt states that she has friends to assist her when needed. Pt states that she has a walker in the home to use when needed. Pt is agreeable to [facility] PT/OT and has no agency preference. CSW reached out to [place] with [place], referral accepted. CSW also added number for [employer] private pay care giving services as spoken with pt and friend at bedside about. CSW to request MD [facility] PT/OT orders. TOC to follow.   Expected Discharge Plan: Home w Home Health Services Barriers to Discharge: Continued Medical Work up   Patient Goals and CMS Choice Patient states their goals for this hospitalization and ongoing recovery are:: return home CMS Medicare.gov Compare Post Acute Care list provided to:: Patient Choice offered to / list presented to : Patient      Expected Discharge Plan and Services In-house Referral: Clinical Social Work Discharge Planning Services: CM Consult Post Acute Care Choice: Home Health Living arrangements for the past 2 months: Single Family Home                                      Prior Living Arrangements/Services Living arrangements for the past 2 months: Single Family Home Lives with:: Self Patient language and need for interpreter reviewed:: Yes Do you feel safe going back to the place where you live?: Yes      Need for Family Participation in Patient Care: Yes (Comment) Care giver support system in place?: Yes (comment) Current home services: DME Criminal Activity/Legal Involvement Pertinent to Current Situation/Hospitalization: No - Comment as needed  Activities of Daily Living Home Assistive Devices/Equipment: Eyeglasses, Dan Humphreys  (specify type) ADL Screening (condition at time of admission) Patient's cognitive ability adequate to safely complete daily activities?: Yes Is the patient deaf or have difficulty hearing?: No Does the patient have difficulty seeing, even when wearing glasses/contacts?: No Does the patient have difficulty concentrating, remembering, or making decisions?: No Patient able to express need for assistance with ADLs?: Yes Does the patient have difficulty dressing or bathing?: No Independently performs ADLs?: Yes (appropriate for developmental age) Does the patient have difficulty walking or climbing stairs?: Yes Weakness of Legs: Both Weakness of Arms/Hands: None  Permission Sought/Granted                  Emotional Assessment Appearance:: Appears stated age Attitude/Demeanor/Rapport: Engaged Affect (typically observed): Accepting Orientation: : Oriented to Self, Oriented to Place, Oriented to  Time, Oriented to Situation Alcohol / Substance Use: Not Applicable Psych Involvement: No (comment)  Admission diagnosis:  Acute cystitis with hematuria [N30.01] Generalized weakness [R53.1] Fall, initial encounter [W19.XXXA] Closed fracture of multiple ribs of right side, initial encounter [S22.41XA] Patient Active Problem List   Diagnosis Date Noted   Hypokalemia 07/27/2023   Generalized weakness 07/26/2023   Falls 12/19/2021   Ataxic gait    UTI (urinary tract infection)    Physical deconditioning    Osteoporosis without current pathological fracture 11/23/2020   Hypertension 10/28/2017   Overactive bladder 06/18/2015   Hyperlipidemia 09/26/2010  ANXIETY 09/26/2010   GERD (gastroesophageal reflux disease) 09/26/2010   CELIAC SPRUE 09/26/2010   PCP:  Dettinger, Elige Radon, MD Pharmacy:   [facility] [phone] - [place] [place] - [address] [place] [address] Phone: [phone] Fax: [phone]     Social Determinants of Health (SDOH) Social  History: SDOH Screenings   Food Insecurity: No Food Insecurity (07/27/2023)  Housing: Low Risk  (07/27/2023)  Transportation Needs: No Transportation Needs (07/27/2023)  Utilities: Not At Risk (07/27/2023)  Alcohol Screen: Low Risk  (12/15/2022)  Depression (PHQ2-9): Low Risk  (05/29/2023)  Financial Resource Strain: Low Risk  (12/15/2022)  Physical Activity: Insufficiently Active (12/15/2022)  Social Connections: Moderately Integrated (12/15/2022)  Stress: No Stress Concern Present (12/15/2022)  Tobacco Use: Low Risk  (07/26/2023)   SDOH Interventions:     Readmission Risk Interventions     No data to display

## 2023-07-27 NOTE — Assessment & Plan Note (Signed)
-   Mechanical fall - Reported weakness since fall - PT eval and treat - Broken ribs on CT the patient thinks are actually from 3-4 weeks ago - Incentive spirometer - Pain control - Continue to monitor

## 2023-07-27 NOTE — Assessment & Plan Note (Addendum)
-   Generalized weakness - Urinary frequency - UA indicative of UTI - Urine culture pending - Continue Rocephin

## 2023-07-28 DIAGNOSIS — S2231XD Fracture of one rib, right side, subsequent encounter for fracture with routine healing: Secondary | ICD-10-CM

## 2023-07-28 DIAGNOSIS — S2249XA Multiple fractures of ribs, unspecified side, initial encounter for closed fracture: Secondary | ICD-10-CM | POA: Diagnosis not present

## 2023-07-28 LAB — BASIC METABOLIC PANEL
Anion gap: 9 (ref 5–15)
BUN: 13 mg/dL (ref 8–23)
CO2: 26 mmol/L (ref 22–32)
Calcium: 8.8 mg/dL — ABNORMAL LOW (ref 8.9–10.3)
Chloride: 98 mmol/L (ref 98–111)
Creatinine, Ser: 0.52 mg/dL (ref 0.44–1.00)
GFR, Estimated: >60 mL/min (ref >60)
Glucose, Bld: 122 mg/dL — ABNORMAL HIGH (ref 70–99)
Potassium: 3.6 mmol/L (ref 3.5–5.1)
Sodium: 133 mmol/L — ABNORMAL LOW (ref 135–145)

## 2023-07-28 LAB — CBC
HCT: 38.2 % (ref 36.0–46.0)
Hemoglobin: 12.9 g/dL (ref 12.0–15.0)
MCH: 31.3 pg (ref 26.0–34.0)
MCHC: 33.8 g/dL (ref 30.0–36.0)
MCV: 92.7 fL (ref 80.0–100.0)
Platelets: 229 10*3/uL (ref 150–400)
RBC: 4.12 MIL/uL (ref 3.87–5.11)
RDW: 13.6 % (ref 11.5–15.5)
WBC: 9.8 10*3/uL (ref 4.0–10.5)
nRBC: 0 % (ref 0.0–0.2)

## 2023-07-28 LAB — MAGNESIUM: Magnesium: 1.9 mg/dL (ref 1.7–2.4)

## 2023-07-28 MED ORDER — POTASSIUM CHLORIDE CRYS ER 20 MEQ PO TBCR
40.0000 meq | EXTENDED_RELEASE_TABLET | Freq: Once | ORAL | Status: AC
  Administered 2023-07-28: 40 meq via ORAL
  Filled 2023-07-28: qty 2

## 2023-07-28 NOTE — Progress Notes (Signed)
PROGRESS NOTE    Kristi Sawyer  ZOX:[id number] DOB: 24-Jan-1926 DOA: 07/26/2023 PCP: Dettinger, Elige Radon, MD     Brief Narrative:  89 year old with history of GERD, anxiety, HLD, HTN, celiac's comes to the hospital after a fall.  Lives alone at home.  She is not on any anticoagulation.  Workup in the ED revealed T7-T9 fracture of right ribs.     Assessment & Plan:  Active Problems:   Hyperlipidemia   GERD (gastroesophageal reflux disease)   Hypertension   UTI (urinary tract infection)   Falls   Generalized weakness   Hypokalemia   Hypokalemia Replete electrolytes as needed   Generalized weakness with mechanical fall Right-sided T7-T9 fracture. Pain control, vitamin D normal.  PT/OT-home health.  Incentive spirometer. Trauma workup including CT head, cervical spine and pelvic x-ray are negative.   UTI (urinary tract infection) Urine culture.  Empiric Rocephin.  Total 3 days   Hypertension Norvasc.  IV as needed   GERD (gastroesophageal reflux disease) Daily PPI   Hyperlipidemia Crestor  PT/OT= HH; F2F done   DVT prophylaxis: Subcu heparin Code Status: Full code Family Communication:   Continue hospital stay for pain control, PT/OT Still having some pain, hopefully home tomorrow.      Subjective: Feels better, requesting another day of inpatient therapy as she lives alone at home.  I agree with her given her age and lack of support system at home.   Examination:  General exam: Appears calm and comfortable, elderly frail Respiratory system: Clear to auscultation. Respiratory effort normal. Cardiovascular system: S1 & S2 heard, RRR. No JVD, murmurs, rubs, gallops or clicks. No pedal edema. Gastrointestinal system: Abdomen is nondistended, soft and nontender. No organomegaly or masses felt. Normal bowel sounds heard. Central nervous system: Alert and oriented. No focal neurological deficits. Extremities: Symmetric 5 x 5 power. Skin: No rashes, lesions or  ulcers Psychiatry: Judgement and insight appear normal. Mood & affect appropriate.       Diet Orders (From admission, onward)     Start     Ordered   07/27/23 0235  Diet Heart Room service appropriate? Yes; Fluid consistency: Thin  Diet effective now       Question Answer Comment  Room service appropriate? Yes   Fluid consistency: Thin      07/27/23 0234            Objective: Vitals:   07/27/23 1037 07/27/23 1438 07/27/23 2144 07/28/23 0420  BP: (!) 161/82 (!) 159/77 (!) 158/71 (!) 157/91  Pulse: 78 76 81 85  Resp: 17 18 20 18   Temp: 97.8 F (36.6 C) 98 F (36.7 C) 98.7 F (37.1 C) 99.1 F (37.3 C)  TempSrc: Oral   Tympanic  SpO2: 95% 96% 98% 97%  Weight: 43.5 kg     Height: 5' (1.524 m)       Intake/Output Summary (Last 24 hours) at 07/28/2023 0834 Last data filed at 07/27/2023 1700 Gross per 24 hour  Intake 948.36 ml  Output --  Net 948.36 ml   Filed Weights   07/26/23 1834 07/27/23 1037  Weight: 43.1 kg 43.5 kg    Scheduled Meds:  amLODipine  5 mg Oral Daily   heparin  5,000 Units Subcutaneous Q8H   influenza vaccine adjuvanted  0.5 mL Intramuscular Tomorrow-1000   lidocaine  1 patch Transdermal Q24H   mirabegron ER  25 mg Oral Daily   pantoprazole  40 mg Oral Daily   potassium chloride  40 mEq Oral Once  rosuvastatin  5 mg Oral Q M,W,F   Continuous Infusions:  cefTRIAXone (ROCEPHIN)  IV Stopped (07/27/23 2310)    Nutritional status     Body mass index is 18.73 kg/m.  Data Reviewed:   CBC: Recent Labs  Lab 07/26/23 2233 07/26/23 2330 07/27/23 0243 07/28/23 0512  WBC  --  13.2* 11.8* 9.8  NEUTROABS  --   --  7.9*  --   HGB 13.9 13.5 13.4 12.9  HCT 41.0 39.8 39.6 38.2  MCV  --  93.4 92.5 92.7  PLT  --  237 237 229   Basic Metabolic Panel: Recent Labs  Lab 07/26/23 2233 07/26/23 2330 07/27/23 0243 07/28/23 0512  NA 136 134* 136 133*  K 3.8 3.4* 3.4* 3.6  CL 99 100 99 98  CO2  --  26 28 26   GLUCOSE 113* 119* 110* 122*   BUN 8 9 9 13   CREATININE 0.50 0.46 0.49 0.52  CALCIUM  --  9.5 9.3 8.8*  MG  --   --  2.0 1.9   GFR: Estimated Creatinine Clearance: 29.5 mL/min (by C-G formula based on SCr of 0.52 mg/dL). Liver Function Tests: Recent Labs  Lab 07/26/23 2330 07/27/23 0243  AST 20 18  ALT 18 18  ALKPHOS 49 47  BILITOT 0.7 0.7  PROT 6.3* 6.3*  ALBUMIN 3.7 3.6   No results for input(s): "LIPASE", "AMYLASE" in the last 168 hours. No results for input(s): "AMMONIA" in the last 168 hours. Coagulation Profile: No results for input(s): "INR", "PROTIME" in the last 168 hours. Cardiac Enzymes: No results for input(s): "CKTOTAL", "CKMB", "CKMBINDEX", "TROPONINI" in the last 168 hours. BNP (last 3 results) No results for input(s): "PROBNP" in the last 8760 hours. HbA1C: No results for input(s): "HGBA1C" in the last 72 hours. CBG: No results for input(s): "GLUCAP" in the last 168 hours. Lipid Profile: No results for input(s): "CHOL", "HDL", "LDLCALC", "TRIG", "CHOLHDL", "LDLDIRECT" in the last 72 hours. Thyroid Function Tests: No results for input(s): "TSH", "T4TOTAL", "FREET4", "T3FREE", "THYROIDAB" in the last 72 hours. Anemia Panel: Recent Labs    07/27/23 0957  VITAMINB12 1,250*   Sepsis Labs: No results for input(s): "PROCALCITON", "LATICACIDVEN" in the last 168 hours.  No results found for this or any previous visit (from the past 240 hour(s)).       Radiology Studies: CT Head Wo Contrast  Result Date: 07/26/2023 CLINICAL DATA:  Fall EXAM: CT HEAD WITHOUT CONTRAST CT CERVICAL SPINE WITHOUT CONTRAST TECHNIQUE: Multidetector CT imaging of the head and cervical spine was performed following the standard protocol without intravenous contrast. Multiplanar CT image reconstructions of the cervical spine were also generated. RADIATION DOSE REDUCTION: This exam was performed according to the departmental dose-optimization program which includes automated exposure control, adjustment of the mA  and/or kV according to patient size and/or use of iterative reconstruction technique. COMPARISON:  10/19/2022 FINDINGS: CT HEAD FINDINGS Brain: No mass, hemorrhage or extra-axial collection. There is generalized volume loss. There is hypoattenuation of the bilateral supratentorial white matter. Vascular: There is atherosclerotic calcification of both internal carotid arteries at the skull base. Skull: Normal. Unchanged trichilemmal cyst of the right anterior scalp. Sinuses/Orbits: Paranasal sinuses are clear. No mastoid effusion. Normal orbits. Other: None CT CERVICAL SPINE FINDINGS Alignment: Reversal of normal cervical lordosis. Skull base and vertebrae: No fracture. Soft tissues and spinal canal: No prevertebral fluid or swelling. No visible canal hematoma. Disc levels: Multilevel disc height loss with endplate remodeling. No high-grade spinal canal stenosis. Upper chest:  Visualized lung apices are clear. Other: Mild carotid vascular calcifications.  Heterogeneous thyroid. IMPRESSION: 1. No acute intracranial abnormality. 2. No acute fracture or static subluxation of the cervical spine. Electronically Signed   By: Deatra Robinson M.D.   On: 07/26/2023 20:44   CT Cervical Spine Wo Contrast  Result Date: 07/26/2023 CLINICAL DATA:  Fall EXAM: CT HEAD WITHOUT CONTRAST CT CERVICAL SPINE WITHOUT CONTRAST TECHNIQUE: Multidetector CT imaging of the head and cervical spine was performed following the standard protocol without intravenous contrast. Multiplanar CT image reconstructions of the cervical spine were also generated. RADIATION DOSE REDUCTION: This exam was performed according to the departmental dose-optimization program which includes automated exposure control, adjustment of the mA and/or kV according to patient size and/or use of iterative reconstruction technique. COMPARISON:  10/19/2022 FINDINGS: CT HEAD FINDINGS Brain: No mass, hemorrhage or extra-axial collection. There is generalized volume loss. There  is hypoattenuation of the bilateral supratentorial white matter. Vascular: There is atherosclerotic calcification of both internal carotid arteries at the skull base. Skull: Normal. Unchanged trichilemmal cyst of the right anterior scalp. Sinuses/Orbits: Paranasal sinuses are clear. No mastoid effusion. Normal orbits. Other: None CT CERVICAL SPINE FINDINGS Alignment: Reversal of normal cervical lordosis. Skull base and vertebrae: No fracture. Soft tissues and spinal canal: No prevertebral fluid or swelling. No visible canal hematoma. Disc levels: Multilevel disc height loss with endplate remodeling. No high-grade spinal canal stenosis. Upper chest: Visualized lung apices are clear. Other: Mild carotid vascular calcifications.  Heterogeneous thyroid. IMPRESSION: 1. No acute intracranial abnormality. 2. No acute fracture or static subluxation of the cervical spine. Electronically Signed   By: Deatra Robinson M.D.   On: 07/26/2023 20:44   DG Pelvis 1-2 Views  Result Date: 07/26/2023 CLINICAL DATA:  Unwitnessed fall. EXAM: PELVIS - 1-2 VIEW COMPARISON:  10/29/2015. FINDINGS: There is no evidence of pelvic fracture or diastasis. No pelvic bone lesions are seen. Degenerative changes are noted in the lower lumbar spine. IMPRESSION: No acute fracture or dislocation. Electronically Signed   By: Thornell Sartorius M.D.   On: 07/26/2023 20:43   DG Chest Port 1 View  Result Date: 07/26/2023 CLINICAL DATA:  Unwitnessed fall. EXAM: PORTABLE CHEST 1 VIEW COMPARISON:  05/29/2023. FINDINGS: The heart size and mediastinal contours are within normal limits. There is atherosclerotic calcification of the aorta. Hyperinflation of the lungs is noted. Chronic interstitial thickening and apical pleural scarring is noted bilaterally. No consolidation, effusion, or pneumothorax. There fractures of the T7, T8, and T9 ribs on the right. IMPRESSION: 1. Fractures of the T7, T8, T9 ribs on the right. 2. Aortic atherosclerosis. Electronically  Signed   By: Thornell Sartorius M.D.   On: 07/26/2023 20:42           LOS: 0 days   Time spent= 35 mins    Miguel Rota, MD [facility]  If 7PM-7AM, please contact night-coverage  07/28/2023, 8:34 AM

## 2023-07-28 NOTE — Hospital Course (Addendum)
  Brief Narrative:  89 year old with history of GERD, anxiety, HLD, HTN, celiac's comes to the hospital after a fall.  Lives alone at home.  She is not on any anticoagulation.  Workup in the ED revealed T7-T9 fracture of right ribs.     Assessment & Plan:  Active Problems:   Hyperlipidemia   GERD (gastroesophageal reflux disease)   Hypertension   UTI (urinary tract infection)   Falls   Generalized weakness   Hypokalemia   Hypokalemia Replete electrolytes as needed   Generalized weakness with mechanical fall Right-sided T7-T9 fracture. Pain control, vitamin D normal.  PT/OT-home health.  Incentive spirometer. Trauma workup including CT head, cervical spine and pelvic x-ray are negative.   UTI (urinary tract infection) Urine culture.  Empiric Rocephin.  Total 3 days   Hypertension Norvasc.  IV as needed   GERD (gastroesophageal reflux disease) Daily PPI   Hyperlipidemia Crestor  PT/OT= HH; F2F done   DVT prophylaxis: Subcu heparin Code Status: Full code Family Communication:   Continue hospital stay for pain control, PT/OT Still having some pain, hopefully home tomorrow.

## 2023-07-29 ENCOUNTER — Other Ambulatory Visit: Payer: Self-pay | Admitting: Family Medicine

## 2023-07-29 ENCOUNTER — Encounter (HOSPITAL_COMMUNITY): Payer: Self-pay

## 2023-07-29 ENCOUNTER — Emergency Department (HOSPITAL_COMMUNITY)
Admission: EM | Admit: 2023-07-29 | Discharge: 2023-07-30 | Disposition: A | Discharge status: Home / Self Care | Payer: Medicare Other | Source: Home / Self Care | Attending: Emergency Medicine | Admitting: Emergency Medicine

## 2023-07-29 DIAGNOSIS — Z043 Encounter for examination and observation following other accident: Secondary | ICD-10-CM | POA: Insufficient documentation

## 2023-07-29 DIAGNOSIS — W19XXXA Unspecified fall, initial encounter: Secondary | ICD-10-CM | POA: Insufficient documentation

## 2023-07-29 DIAGNOSIS — Z743 Need for continuous supervision: Secondary | ICD-10-CM | POA: Diagnosis not present

## 2023-07-29 DIAGNOSIS — S2249XA Multiple fractures of ribs, unspecified side, initial encounter for closed fracture: Secondary | ICD-10-CM | POA: Diagnosis not present

## 2023-07-29 DIAGNOSIS — R531 Weakness: Secondary | ICD-10-CM | POA: Diagnosis not present

## 2023-07-29 DIAGNOSIS — R6889 Other general symptoms and signs: Secondary | ICD-10-CM | POA: Diagnosis not present

## 2023-07-29 DIAGNOSIS — N390 Urinary tract infection, site not specified: Secondary | ICD-10-CM | POA: Diagnosis not present

## 2023-07-29 DIAGNOSIS — Z79899 Other long term (current) drug therapy: Secondary | ICD-10-CM | POA: Insufficient documentation

## 2023-07-29 DIAGNOSIS — R42 Dizziness and giddiness: Secondary | ICD-10-CM

## 2023-07-29 LAB — CBC
HCT: 39.7 % (ref 36.0–46.0)
Hemoglobin: 13.4 g/dL (ref 12.0–15.0)
MCH: 31.2 pg (ref 26.0–34.0)
MCHC: 33.8 g/dL (ref 30.0–36.0)
MCV: 92.3 fL (ref 80.0–100.0)
Platelets: 226 10*3/uL (ref 150–400)
RBC: 4.3 MIL/uL (ref 3.87–5.11)
RDW: 13.2 % (ref 11.5–15.5)
WBC: 9.1 10*3/uL (ref 4.0–10.5)
nRBC: 0 % (ref 0.0–0.2)

## 2023-07-29 LAB — BASIC METABOLIC PANEL
Anion gap: 7 (ref 5–15)
Anion gap: 10 (ref 5–15)
BUN: 13 mg/dL (ref 8–23)
BUN: 13 mg/dL (ref 8–23)
CO2: 26 mmol/L (ref 22–32)
CO2: 22 mmol/L (ref 22–32)
Calcium: 8.8 mg/dL — ABNORMAL LOW (ref 8.9–10.3)
Calcium: 9.1 mg/dL (ref 8.9–10.3)
Chloride: 96 mmol/L — ABNORMAL LOW (ref 98–111)
Chloride: 95 mmol/L — ABNORMAL LOW (ref 98–111)
Creatinine, Ser: 0.49 mg/dL (ref 0.44–1.00)
Creatinine, Ser: 0.53 mg/dL (ref 0.44–1.00)
GFR, Estimated: >60 mL/min (ref >60)
GFR, Estimated: >60 mL/min (ref >60)
Glucose, Bld: 111 mg/dL — ABNORMAL HIGH (ref 70–99)
Glucose, Bld: 120 mg/dL — ABNORMAL HIGH (ref 70–99)
Potassium: 3.5 mmol/L (ref 3.5–5.1)
Potassium: 4.1 mmol/L (ref 3.5–5.1)
Sodium: 129 mmol/L — ABNORMAL LOW (ref 135–145)
Sodium: 127 mmol/L — ABNORMAL LOW (ref 135–145)

## 2023-07-29 LAB — URINE CULTURE: Culture: >=100000 — AB

## 2023-07-29 LAB — MAGNESIUM: Magnesium: 1.8 mg/dL (ref 1.7–2.4)

## 2023-07-29 MED ORDER — POTASSIUM CHLORIDE CRYS ER 20 MEQ PO TBCR
40.0000 meq | EXTENDED_RELEASE_TABLET | Freq: Once | ORAL | Status: AC
  Administered 2023-07-29: 40 meq via ORAL
  Filled 2023-07-29: qty 2

## 2023-07-29 MED ORDER — LIDOCAINE 5 % EX PTCH: 1.0000 | MEDICATED_PATCH | CUTANEOUS | 0 refills | Status: DC

## 2023-07-29 MED ORDER — SODIUM CHLORIDE 0.9 % IV BOLUS
500.0000 mL | Freq: Once | INTRAVENOUS | Status: AC
  Administered 2023-07-29: 500 mL via INTRAVENOUS

## 2023-07-29 MED ORDER — POLYETHYLENE GLYCOL 3350 17 G PO PACK: 17.0000 g | PACK | Freq: Two times a day (BID) | ORAL | 0 refills | Status: DC | PRN

## 2023-07-29 MED ORDER — CEPHALEXIN 250 MG PO CAPS: 250.0000 mg | ORAL_CAPSULE | Freq: Three times a day (TID) | ORAL | 0 refills | Status: AC

## 2023-07-29 NOTE — Progress Notes (Signed)
Nsg Discharge Note  Admit Date:  07/26/2023 Discharge date: 07/29/2023   [name] to be D/C'd Home per MD order.  AVS completed.   Patient/caregiver able to verbalize understanding.  Discharge Medication: Allergies as of 07/29/2023       Reactions   Prevnar 13 [pneumococcal 13-val Conj Vacc] Hives   Red/streaking arm   Penicillins Hives   Tetanus Toxoids Other (See Comments)   Area of injection was red and streaked.   Codeine Nausea Only   Sulfonamide Derivatives Nausea Only        Medication List     STOP taking these medications    doxycycline 100 MG tablet Commonly known as: VIBRA-TABS       TAKE these medications    acetaminophen 325 MG tablet Commonly known as: TYLENOL Take 2 tablets (650 mg total) by mouth every 6 (six) hours as needed for mild pain (or Fever >/= 101).   amLODipine 5 MG tablet Commonly known as: NORVASC Take 1 tablet (5 mg total) by mouth daily.   CALTRATE PLUS PO Take 1 tablet by mouth daily.   CENTRUM SILVER PO Take 1 tablet by mouth daily.   cephALEXin 250 MG capsule Commonly known as: KEFLEX Take 1 capsule (250 mg total) by mouth 3 (three) times daily for 3 days.   [facility] Vitamin C 500 MG tablet Generic drug: ascorbic acid TAKE 1 TABLET (500 MG TOTAL) BY MOUTH DAILY.   Fish Oil 1000 MG Caps Take 1 capsule by mouth 3 (three) times daily.   Gemtesa 75 MG Tabs Generic drug: Vibegron Take 1 tablet by mouth daily.   lidocaine 5 % Commonly known as: LIDODERM Place 1 patch onto the skin daily. Remove & Discard patch within 12 hours or as directed by MD   metoCLOPramide 10 MG tablet Commonly known as: REGLAN TAKE 1 TABLET BY MOUTH EVERYDAY AT BEDTIME   omeprazole 20 MG capsule Commonly known as: PRILOSEC PLEASE MAKE A FOLLOW UP APPOINTMENT. (TAKE1-2 CAPSULES BY MOUTH DAILY(20-40MG )   polyethylene glycol 17 g packet Commonly known as: MIRALAX / GLYCOLAX Take 17 g by mouth 2 (two) times daily as needed for severe  constipation.   rosuvastatin 5 MG tablet Commonly known as: CRESTOR TAKE 1 TABLET BY MOUTH ON MONDAY, WEDNESDAY, AND FRIDAY   Vitamin D 50 MCG (2000 UT) Caps Take by mouth as directed. Take 1 tablet daily except none on saturdays and sundays   zinc sulfate 220 (50 Zn) MG capsule TAKE 1 CAPSULE BY MOUTH EVERY DAY        Discharge Assessment: Vitals:   07/28/23 1935 07/29/23 0450  BP: (!) 149/82 (!) 153/89  Pulse: 78 79  Resp: 18 17  Temp: 98.5 F (36.9 C) 98.7 F (37.1 C)  SpO2: 98% 97%   Skin clean, dry and intact without evidence of skin break down, no evidence of skin tears noted. IV catheter discontinued intact. Site without signs and symptoms of complications - no redness or edema noted at insertion site, patient denies c/o pain - only slight tenderness at site.  Dressing with slight pressure applied.  D/c Instructions-Education: Discharge instructions given to patient/family with verbalized understanding. D/c education completed with patient/family including follow up instructions, medication list, d/c activities limitations if indicated, with other d/c instructions as indicated by MD - patient able to verbalize understanding, all questions fully answered. Patient instructed to return to ED, call 911, or call MD for any changes in condition.  Patient escorted via WC, and D/C  home via private auto.  Laurena Spies, RN 07/29/2023 11:19 AM

## 2023-07-29 NOTE — ED Triage Notes (Signed)
Pt called EMS due to having multiple falls at home. Pt was dc earlier today after being seen for the same complaints. Pt was diagnosed with UTI earlier today and stated that she fell again after getting home. Wants to be reevaluated. Pt denies pain or hitting her head.

## 2023-07-29 NOTE — Discharge Summary (Addendum)
Physician Discharge Summary  Kristi Sawyer IHK:[id number] DOB: 01/05/1926 DOA: 07/26/2023  PCP: Dettinger, Elige Radon, MD  Admit date: 07/26/2023 Discharge date: 07/29/2023  Admitted From: Home Disposition: Home  Recommendations for Outpatient Follow-up:  Follow up with PCP in 1-2 weeks Please obtain BMP/CBC in one week your next doctors visit.  Need to check his sodium. Lidocaine patch prescribed.  Does not want any narcotics.   Discharge Condition: Stable CODE STATUS: DNR Diet recommendation: Regular  Brief/Interim Summary:  Brief Narrative:  89 year old with history of GERD, anxiety, HLD, HTN, celiac's comes to the hospital after a fall.  Lives alone at home.  She is not on any anticoagulation.  Workup in the ED revealed T7-T9 fracture of right ribs.  Physical therapy recommended home health services.  Trauma workup was negative.  Eventually a plane was well-controlled and she will be stable for discharge today.     Assessment & Plan:  Active Problems:   Hyperlipidemia   GERD (gastroesophageal reflux disease)   Hypertension   UTI (urinary tract infection)   Falls   Generalized weakness   Hypokalemia   Hypokalemia Replete electrolytes as needed  Hyponatremia to dehydration - Like from dehydration.  Will give gentle fluid bolus prior to discharge.  Outpatient check in about next 5 to 7 days with PCP.  Advised to increase her salt and fluid intake at home.  Tolerating orals without any issues.   Generalized weakness with mechanical fall Right-sided T7-T9 fracture. Pain control, vitamin D normal.  PT/OT-home health.  Incentive spirometer. Trauma workup including CT head, cervical spine and pelvic x-ray are negative.   UTI (urinary tract infection), E. coli Urine culture growing E. coli.  Empiric Rocephin.   will transition to Keflex to complete the course.   Hypertension Norvasc.  IV as needed   GERD (gastroesophageal reflux disease) Daily PPI    Hyperlipidemia Crestor  PT/OT= HH; F2F done   DVT prophylaxis: Subcu heparin Code Status: Full code Family Communication:   DC today     Discharge Diagnoses:  Active Problems:   Hyperlipidemia   GERD (gastroesophageal reflux disease)   Hypertension   UTI (urinary tract infection)   Falls   Generalized weakness   Hypokalemia      Subjective: Doing significantly well, does not have any pain. Really wishes to go home today.  She understands she will need to follow-up with her PCP with blood work for hyponatremia Discharge Exam: Vitals:   07/28/23 1935 07/29/23 0450  BP: (!) 149/82 (!) 153/89  Pulse: 78 79  Resp: 18 17  Temp: 98.5 F (36.9 C) 98.7 F (37.1 C)  SpO2: 98% 97%   Vitals:   07/28/23 0420 07/28/23 1349 07/28/23 1935 07/29/23 0450  BP: (!) 157/91 (!) 142/74 (!) 149/82 (!) 153/89  Pulse: 85 75 78 79  Resp: 18 17 18 17   Temp: 99.1 F (37.3 C) 98.3 F (36.8 C) 98.5 F (36.9 C) 98.7 F (37.1 C)  TempSrc: Tympanic  Oral Oral  SpO2: 97% 99% 98% 97%  Weight:      Height:        General: Pt is alert, awake, not in acute distress Cardiovascular: RRR, S1/S2 +, no rubs, no gallops Respiratory: CTA bilaterally, no wheezing, no rhonchi Abdominal: Soft, NT, ND, bowel sounds + Extremities: no edema, no cyanosis  Discharge Instructions  Discharge Instructions     Discharge patient   Complete by: As directed    Discharge disposition: 01-Home or Self Care   Discharge  patient date: 07/29/2023      Allergies as of 07/29/2023       Reactions   Prevnar 13 [pneumococcal 13-val Conj Vacc] Hives   Red/streaking arm   Penicillins Hives   Tetanus Toxoids Other (See Comments)   Area of injection was red and streaked.   Codeine Nausea Only   Sulfonamide Derivatives Nausea Only        Medication List     STOP taking these medications    doxycycline 100 MG tablet Commonly known as: VIBRA-TABS       TAKE these medications    acetaminophen 325  MG tablet Commonly known as: TYLENOL Take 2 tablets (650 mg total) by mouth every 6 (six) hours as needed for mild pain (or Fever >/= 101).   amLODipine 5 MG tablet Commonly known as: NORVASC Take 1 tablet (5 mg total) by mouth daily.   CALTRATE PLUS PO Take 1 tablet by mouth daily.   CENTRUM SILVER PO Take 1 tablet by mouth daily.   cephALEXin 250 MG capsule Commonly known as: KEFLEX Take 1 capsule (250 mg total) by mouth 3 (three) times daily for 3 days.   [employer] Vitamin C 500 MG tablet Generic drug: ascorbic acid TAKE 1 TABLET (500 MG TOTAL) BY MOUTH DAILY.   Fish Oil 1000 MG Caps Take 1 capsule by mouth 3 (three) times daily.   Gemtesa 75 MG Tabs Generic drug: Vibegron Take 1 tablet by mouth daily.   lidocaine 5 % Commonly known as: LIDODERM Place 1 patch onto the skin daily. Remove & Discard patch within 12 hours or as directed by MD   metoCLOPramide 10 MG tablet Commonly known as: REGLAN TAKE 1 TABLET BY MOUTH EVERYDAY AT BEDTIME   omeprazole 20 MG capsule Commonly known as: PRILOSEC PLEASE MAKE A FOLLOW UP APPOINTMENT. (TAKE1-2 CAPSULES BY MOUTH DAILY(20-40MG )   polyethylene glycol 17 g packet Commonly known as: MIRALAX / GLYCOLAX Take 17 g by mouth 2 (two) times daily as needed for severe constipation.   rosuvastatin 5 MG tablet Commonly known as: CRESTOR TAKE 1 TABLET BY MOUTH ON MONDAY, WEDNESDAY, AND FRIDAY   Vitamin D 50 MCG (2000 UT) Caps Take by mouth as directed. Take 1 tablet daily except none on saturdays and sundays   zinc sulfate 220 (50 Zn) MG capsule TAKE 1 CAPSULE BY MOUTH EVERY DAY        Follow-up Information     [facility]). Call.   Why: Please call office at [phone]. Contact information: [address], [place], [place], [address]        Dettinger, Elige Radon, MD Follow up in 1 week(s).   Specialties: Family Medicine, Cardiology Contact information: [address] [place]  [address] [phone]                Allergies  Allergen Reactions   Prevnar 13 [Pneumococcal 13-Val Conj Vacc] Hives    Red/streaking arm   Penicillins Hives   Tetanus Toxoids Other (See Comments)    Area of injection was red and streaked.   Codeine Nausea Only   Sulfonamide Derivatives Nausea Only    You were cared for by a hospitalist during your hospital stay. If you have any questions about your discharge medications or the care you received while you were in the hospital after you are discharged, you can call the unit and asked to speak with the hospitalist on call if the hospitalist that took care of  you is not available. Once you are discharged, your primary care physician will handle any further medical issues. Please note that no refills for any discharge medications will be authorized once you are discharged, as it is imperative that you return to your primary care physician (or establish a relationship with a primary care physician if you do not have one) for your aftercare needs so that they can reassess your need for medications and monitor your lab values.  You were cared for by a hospitalist during your hospital stay. If you have any questions about your discharge medications or the care you received while you were in the hospital after you are discharged, you can call the unit and asked to speak with the hospitalist on call if the hospitalist that took care of you is not available. Once you are discharged, your primary care physician will handle any further medical issues. Please note that NO REFILLS for any discharge medications will be authorized once you are discharged, as it is imperative that you return to your primary care physician (or establish a relationship with a primary care physician if you do not have one) for your aftercare needs so that they can reassess your need for medications and monitor your lab values.  Please request your Prim.MD to go over all Hospital  Tests and Procedure/Radiological results at the follow up, please get all Hospital records sent to your Prim MD by signing hospital release before you go home.  Get CBC, CMP, 2 view Chest X ray checked  by Primary MD during your next visit or SNF MD in 5-7 days ( we routinely change or add medications that can affect your baseline labs and fluid status, therefore we recommend that you get the mentioned basic workup next visit with your PCP, your PCP may decide not to get them or add new tests based on their clinical decision)  On your next visit with your primary care physician please Get Medicines reviewed and adjusted.  If you experience worsening of your admission symptoms, develop shortness of breath, life threatening emergency, suicidal or homicidal thoughts you must seek medical attention immediately by calling 911 or calling your MD immediately  if symptoms less severe.  You Must read complete instructions/literature along with all the possible adverse reactions/side effects for all the Medicines you take and that have been prescribed to you. Take any new Medicines after you have completely understood and accpet all the possible adverse reactions/side effects.   Do not drive, operate heavy machinery, perform activities at heights, swimming or participation in water activities or provide baby sitting services if your were admitted for syncope or siezures until you have seen by Primary MD or a Neurologist and advised to do so again.  Do not drive when taking Pain medications.   Procedures/Studies: CT Head Wo Contrast  Result Date: 07/26/2023 CLINICAL DATA:  Fall EXAM: CT HEAD WITHOUT CONTRAST CT CERVICAL SPINE WITHOUT CONTRAST TECHNIQUE: Multidetector CT imaging of the head and cervical spine was performed following the standard protocol without intravenous contrast. Multiplanar CT image reconstructions of the cervical spine were also generated. RADIATION DOSE REDUCTION: This exam was  performed according to the departmental dose-optimization program which includes automated exposure control, adjustment of the mA and/or kV according to patient size and/or use of iterative reconstruction technique. COMPARISON:  10/19/2022 FINDINGS: CT HEAD FINDINGS Brain: No mass, hemorrhage or extra-axial collection. There is generalized volume loss. There is hypoattenuation of the bilateral supratentorial white matter. Vascular: There is atherosclerotic  calcification of both internal carotid arteries at the skull base. Skull: Normal. Unchanged trichilemmal cyst of the right anterior scalp. Sinuses/Orbits: Paranasal sinuses are clear. No mastoid effusion. Normal orbits. Other: None CT CERVICAL SPINE FINDINGS Alignment: Reversal of normal cervical lordosis. Skull base and vertebrae: No fracture. Soft tissues and spinal canal: No prevertebral fluid or swelling. No visible canal hematoma. Disc levels: Multilevel disc height loss with endplate remodeling. No high-grade spinal canal stenosis. Upper chest: Visualized lung apices are clear. Other: Mild carotid vascular calcifications.  Heterogeneous thyroid. IMPRESSION: 1. No acute intracranial abnormality. 2. No acute fracture or static subluxation of the cervical spine. Electronically Signed   By: Deatra Robinson M.D.   On: 07/26/2023 20:44   CT Cervical Spine Wo Contrast  Result Date: 07/26/2023 CLINICAL DATA:  Fall EXAM: CT HEAD WITHOUT CONTRAST CT CERVICAL SPINE WITHOUT CONTRAST TECHNIQUE: Multidetector CT imaging of the head and cervical spine was performed following the standard protocol without intravenous contrast. Multiplanar CT image reconstructions of the cervical spine were also generated. RADIATION DOSE REDUCTION: This exam was performed according to the departmental dose-optimization program which includes automated exposure control, adjustment of the mA and/or kV according to patient size and/or use of iterative reconstruction technique. COMPARISON:   10/19/2022 FINDINGS: CT HEAD FINDINGS Brain: No mass, hemorrhage or extra-axial collection. There is generalized volume loss. There is hypoattenuation of the bilateral supratentorial white matter. Vascular: There is atherosclerotic calcification of both internal carotid arteries at the skull base. Skull: Normal. Unchanged trichilemmal cyst of the right anterior scalp. Sinuses/Orbits: Paranasal sinuses are clear. No mastoid effusion. Normal orbits. Other: None CT CERVICAL SPINE FINDINGS Alignment: Reversal of normal cervical lordosis. Skull base and vertebrae: No fracture. Soft tissues and spinal canal: No prevertebral fluid or swelling. No visible canal hematoma. Disc levels: Multilevel disc height loss with endplate remodeling. No high-grade spinal canal stenosis. Upper chest: Visualized lung apices are clear. Other: Mild carotid vascular calcifications.  Heterogeneous thyroid. IMPRESSION: 1. No acute intracranial abnormality. 2. No acute fracture or static subluxation of the cervical spine. Electronically Signed   By: Deatra Robinson M.D.   On: 07/26/2023 20:44   DG Pelvis 1-2 Views  Result Date: 07/26/2023 CLINICAL DATA:  Unwitnessed fall. EXAM: PELVIS - 1-2 VIEW COMPARISON:  10/29/2015. FINDINGS: There is no evidence of pelvic fracture or diastasis. No pelvic bone lesions are seen. Degenerative changes are noted in the lower lumbar spine. IMPRESSION: No acute fracture or dislocation. Electronically Signed   By: Thornell Sartorius M.D.   On: 07/26/2023 20:43   DG Chest Port 1 View  Result Date: 07/26/2023 CLINICAL DATA:  Unwitnessed fall. EXAM: PORTABLE CHEST 1 VIEW COMPARISON:  05/29/2023. FINDINGS: The heart size and mediastinal contours are within normal limits. There is atherosclerotic calcification of the aorta. Hyperinflation of the lungs is noted. Chronic interstitial thickening and apical pleural scarring is noted bilaterally. No consolidation, effusion, or pneumothorax. There fractures of the T7, T8,  and T9 ribs on the right. IMPRESSION: 1. Fractures of the T7, T8, T9 ribs on the right. 2. Aortic atherosclerosis. Electronically Signed   By: Thornell Sartorius M.D.   On: 07/26/2023 20:42     The results of significant diagnostics from this hospitalization (including imaging, microbiology, ancillary and laboratory) are listed below for reference.     Microbiology: Recent Results (from the past 240 hour(s))  Urine Culture     Status: Abnormal   Collection Time: 07/26/23 10:06 PM   Specimen: Urine, Clean Catch  Result Value Ref  Range Status   Specimen Description   Final    URINE, CLEAN CATCH Performed at [facility], [address] [place], [place] [address]    Special Requests   Final    NONE Performed at [facility], [address] [place], [place] [address]    Culture >=100,000 COLONIES/mL ESCHERICHIA COLI (A)  Final   Report Status 07/29/2023 FINAL  Final   Organism ID, Bacteria ESCHERICHIA COLI (A)  Final      Susceptibility   Escherichia coli - MIC*    AMPICILLIN >=32 RESISTANT Resistant     CEFAZOLIN >=64 RESISTANT Resistant     CEFEPIME <=0.12 SENSITIVE Sensitive     CEFTRIAXONE 1 SENSITIVE Sensitive     CIPROFLOXACIN <=0.25 SENSITIVE Sensitive     GENTAMICIN <=1 SENSITIVE Sensitive     IMIPENEM <=0.25 SENSITIVE Sensitive     NITROFURANTOIN <=16 SENSITIVE Sensitive     TRIMETH/SULFA <=20 SENSITIVE Sensitive     AMPICILLIN/SULBACTAM >=32 RESISTANT Resistant     PIP/TAZO 16 SENSITIVE Sensitive     * >=100,000 COLONIES/mL ESCHERICHIA COLI     Labs: BNP (last 3 results) No results for input(s): "BNP" in the last 8760 hours. Basic Metabolic Panel: Recent Labs  Lab 07/26/23 2330 07/27/23 0243 07/28/23 0512 07/29/23 0545 07/29/23 0947  NA 134* 136 133* 129* 127*  K 3.4* 3.4* 3.6 3.5 4.1  CL 100 99 98 96* 95*  CO2 26 28 26 26 22   GLUCOSE 119* 110* 122* 111* 120*  BUN 9 9 13 13 13   CREATININE 0.46 0.49 0.52 0.49 0.53  CALCIUM 9.5 9.3 8.8* 8.8* 9.1  MG  --  2.0  1.9 1.8  --    Liver Function Tests: Recent Labs  Lab 07/26/23 2330 07/27/23 0243  AST 20 18  ALT 18 18  ALKPHOS 49 47  BILITOT 0.7 0.7  PROT 6.3* 6.3*  ALBUMIN 3.7 3.6   No results for input(s): "LIPASE", "AMYLASE" in the last 168 hours. No results for input(s): "AMMONIA" in the last 168 hours. CBC: Recent Labs  Lab 07/26/23 2233 07/26/23 2330 07/27/23 0243 07/28/23 0512 07/29/23 0545  WBC  --  13.2* 11.8* 9.8 9.1  NEUTROABS  --   --  7.9*  --   --   HGB 13.9 13.5 13.4 12.9 13.4  HCT 41.0 39.8 39.6 38.2 39.7  MCV  --  93.4 92.5 92.7 92.3  PLT  --  237 237 229 226   Cardiac Enzymes: No results for input(s): "CKTOTAL", "CKMB", "CKMBINDEX", "TROPONINI" in the last 168 hours. BNP: Invalid input(s): "POCBNP" CBG: No results for input(s): "GLUCAP" in the last 168 hours. D-Dimer No results for input(s): "DDIMER" in the last 72 hours. Hgb A1c No results for input(s): "HGBA1C" in the last 72 hours. Lipid Profile No results for input(s): "CHOL", "HDL", "LDLCALC", "TRIG", "CHOLHDL", "LDLDIRECT" in the last 72 hours. Thyroid function studies No results for input(s): "TSH", "T4TOTAL", "T3FREE", "THYROIDAB" in the last 72 hours.  Invalid input(s): "FREET3" Anemia work up Recent Labs    07/27/23 0957  VITAMINB12 1,250*   Urinalysis    Component Value Date/Time   COLORURINE YELLOW 07/26/2023 1845   APPEARANCEUR HAZY (A) 07/26/2023 1845   LABSPEC 1.012 07/26/2023 1845   PHURINE 6.0 07/26/2023 1845   GLUCOSEU NEGATIVE 07/26/2023 1845   HGBUR NEGATIVE 07/26/2023 1845   BILIRUBINUR NEGATIVE 07/26/2023 1845   BILIRUBINUR neg 07/07/2014 1033   KETONESUR 20 (A) 07/26/2023 1845   PROTEINUR 30 (A) 07/26/2023 1845  UROBILINOGEN negative 07/07/2014 1033   NITRITE POSITIVE (A) 07/26/2023 1845   LEUKOCYTESUR MODERATE (A) 07/26/2023 1845   Sepsis Labs Recent Labs  Lab 07/26/23 2330 07/27/23 0243 07/28/23 0512 07/29/23 0545  WBC 13.2* 11.8* 9.8 9.1    Microbiology Recent Results (from the past 240 hour(s))  Urine Culture     Status: Abnormal   Collection Time: 07/26/23 10:06 PM   Specimen: Urine, Clean Catch  Result Value Ref Range Status   Specimen Description   Final    URINE, CLEAN CATCH Performed at [facility], [address], [place], [place] [address]    Special Requests   Final    NONE Performed at [facility], [address], [place], [place] [address]    Culture >=100,000 COLONIES/mL ESCHERICHIA COLI (A)  Final   Report Status 07/29/2023 FINAL  Final   Organism ID, Bacteria ESCHERICHIA COLI (A)  Final      Susceptibility   Escherichia coli - MIC*    AMPICILLIN >=32 RESISTANT Resistant     CEFAZOLIN >=64 RESISTANT Resistant     CEFEPIME <=0.12 SENSITIVE Sensitive     CEFTRIAXONE 1 SENSITIVE Sensitive     CIPROFLOXACIN <=0.25 SENSITIVE Sensitive     GENTAMICIN <=1 SENSITIVE Sensitive     IMIPENEM <=0.25 SENSITIVE Sensitive     NITROFURANTOIN <=16 SENSITIVE Sensitive     TRIMETH/SULFA <=20 SENSITIVE Sensitive     AMPICILLIN/SULBACTAM >=32 RESISTANT Resistant     PIP/TAZO 16 SENSITIVE Sensitive     * >=100,000 COLONIES/mL ESCHERICHIA COLI     Time coordinating discharge:  I have spent 35 minutes face to face with the patient and on the ward discussing the patients care, assessment, plan and disposition with other care givers. >50% of the time was devoted counseling the patient about the risks and benefits of treatment/Discharge disposition and coordinating care.   SIGNED:   Miguel Rota, MD  [facility] 07/29/2023, 11:18 AM   If 7PM-7AM, please contact night-coverage

## 2023-07-30 ENCOUNTER — Telehealth (HOSPITAL_COMMUNITY): Payer: Self-pay | Admitting: Pharmacy Technician

## 2023-07-30 NOTE — ED Notes (Signed)
Spoke to Pt's friend, Clinical cytogeneticist.  Sts she will pick-up the Pt, but it's going to be a couple hours.

## 2023-07-30 NOTE — ED Provider Notes (Signed)
[name] [facility]GENCY DEPARTMENT AT [facility]  Provider Note  CSN: [id number] Arrival date & time: 07/29/23 2232  History Chief Complaint  Patient presents with   Kristi Sawyer    Kristi Sawyer is a 89 y.o. female arrives by EMS from home where she lives alone independently. She was recently admitted to the hospital after a fall and had rib fractures and a UTI. She was discharged earlier in the day of arrival back home where she typically gets around with a walker. She reports she was at the end of her bed when she fell again this afternoon. She was unable to get up and so EMS was called to the scene. They were able to assist her up and recommended she come to the ED for evaluation. She denies any injuries or pain during my evaluation. No head injury or LOC. She feels safe being at home and using her walker. She has caregivers that help her out during the day.   Home Medications Prior to Admission medications   Medication Sig Start Date End Date Taking? Authorizing Provider  acetaminophen (TYLENOL) 325 MG tablet Take 2 tablets (650 mg total) by mouth every 6 (six) hours as needed for mild pain (or Fever >/= 101). 12/20/21   Shon Hale, MD  amLODipine (NORVASC) 5 MG tablet Take 1 tablet (5 mg total) by mouth daily. 04/18/23   Dettinger, Elige Radon, MD  Calcium Carbonate-Vit D-Min (CALTRATE PLUS PO) Take 1 tablet by mouth daily.    [provider]  cephALEXin (KEFLEX) 250 MG capsule Take 1 capsule (250 mg total) by mouth 3 (three) times daily for 3 days. 07/29/23 08/01/23  Miguel Rota, MD  Cholecalciferol (VITAMIN D) 2000 UNITS CAPS Take by mouth as directed. Take 1 tablet daily except none on saturdays and sundays    [provider]  CVS VITAMIN C 500 MG tablet TAKE 1 TABLET (500 MG TOTAL) BY MOUTH DAILY. 12/28/22   Dettinger, Elige Radon, MD  GEMTESA 75 MG TABS Take 1 tablet by mouth daily. 07/14/22   [provider]  lidocaine (LIDODERM) 5 % Place 1 patch onto the  skin daily. Remove & Discard patch within 12 hours or as directed by MD 07/29/23   Miguel Rota, MD  metoCLOPramide (REGLAN) 10 MG tablet TAKE 1 TABLET BY MOUTH EVERYDAY AT BEDTIME 05/21/23   [name], MD  Multiple Vitamins-Minerals (CENTRUM SILVER PO) Take 1 tablet by mouth daily.    [provider]  Omega-3 Fatty Acids (FISH OIL) 1000 MG CAPS Take 1 capsule by mouth 3 (three) times daily.     [provider]  omeprazole (PRILOSEC) 20 MG capsule PLEASE MAKE A FOLLOW UP APPOINTMENT. (TAKE1-2 CAPSULES BY MOUTH DAILY(20-40MG ) 04/16/23   [name], PA-C  polyethylene glycol (MIRALAX / GLYCOLAX) 17 g packet Take 17 g by mouth 2 (two) times daily as needed for severe constipation. 07/29/23   Amin, Ankit C, MD  rosuvastatin (CRESTOR) 5 MG tablet TAKE 1 TABLET BY MOUTH ON MONDAY, WEDNESDAY, AND FRIDAY 04/18/23   Dettinger, Elige Radon, MD  zinc sulfate 220 (50 Zn) MG capsule TAKE 1 CAPSULE BY MOUTH EVERY DAY 03/07/23   Dettinger, Elige Radon, MD     Allergies    Prevnar 13 [pneumococcal 13-val conj vacc], Penicillins, Tetanus toxoids, Codeine, and Sulfonamide derivatives   Review of Systems   Review of Systems Please see HPI for pertinent positives and negatives  Physical Exam BP 128/75   Pulse  97   Temp 98.7 F (37.1 C) (Oral)   Resp 17   SpO2 96%   Physical Exam Vitals and nursing note reviewed.  Constitutional:      Appearance: Normal appearance.  HENT:     Head: Normocephalic and atraumatic.     Nose: Nose normal.     Mouth/Throat:     Mouth: Mucous membranes are moist.  Eyes:     Extraocular Movements: Extraocular movements intact.     Conjunctiva/sclera: Conjunctivae normal.  Cardiovascular:     Rate and Rhythm: Normal rate.  Pulmonary:     Effort: Pulmonary effort is normal.     Breath sounds: Normal breath sounds.  Abdominal:     General: Abdomen is flat.     Palpations: Abdomen is soft.     Tenderness: There is no abdominal tenderness. There is no  guarding.  Musculoskeletal:        General: No swelling, tenderness or deformity. Normal range of motion.     Cervical back: Neck supple.  Skin:    General: Skin is warm and dry.  Neurological:     General: No focal deficit present.     Mental Status: She is alert.  Psychiatric:        Mood and Affect: Mood normal.     ED Results / Procedures / Treatments   EKG None  Procedures Procedures  Medications Ordered in the ED Medications - No data to display  Initial Impression and Plan  Patient here after mechanical fall with no complaints. I discussed her recent history of multiple falls and whether she felt comfortable continuing to live at home alone and she assures me she is. She does not have any indication for additional ED workup at this time. Recommend she continue to use her walker. Take her medications as prescribed and to follow up with PCP and/or [facility] if she has any further concerns about her safety at home.   ED Course       MDM Rules/Calculators/A&P Medical Decision Making Problems Addressed: Fall, initial encounter: acute illness or injury     Final Clinical Impression(s) / ED Diagnoses Final diagnoses:  Fall, initial encounter    Rx / DC Orders ED Discharge Orders     None        Pollyann Savoy, MD 07/30/23 [phone]

## 2023-07-30 NOTE — ED Notes (Signed)
Pt given ice water.

## 2023-07-30 NOTE — ED Notes (Signed)
Patient assisted to restroom with use of walker and one person assist. Patient had urinated in pants. Cleaned patient and gave paper scrubs and brief.  Changed patient's linen and placed back in bed.

## 2023-07-30 NOTE — ED Notes (Signed)
Pt given breakfast tray

## 2023-07-30 NOTE — Telephone Encounter (Signed)
Pharmacy Patient Advocate Encounter   Received notification from Fax that prior authorization for Lidocaine 5% patches is required/requested.   Insurance verification completed.   The patient is insured through [facility] [employer] .   Per test claim: PA required; PA submitted to [facility] via CoverMyMeds Key/confirmation #/EOC [id number] Status is pending

## 2023-07-31 ENCOUNTER — Other Ambulatory Visit (HOSPITAL_COMMUNITY): Payer: Self-pay

## 2023-07-31 NOTE — Telephone Encounter (Signed)
Pharmacy Patient Advocate Encounter  Received notification from [facility] that Prior Authorization for  Lidocaine 5% patches  has been APPROVED from 07/30/2023 to 11/13/2023. Ran test claim, Copay is $4.50. This test claim was processed through [facility]- copay amounts may vary at other pharmacies due to pharmacy/plan contracts, or as the patient moves through the different stages of their insurance plan.   PA #/Case ID/Reference #: [id number]

## 2023-08-02 DIAGNOSIS — I10 Essential (primary) hypertension: Secondary | ICD-10-CM | POA: Diagnosis not present

## 2023-08-02 DIAGNOSIS — E7800 Pure hypercholesterolemia, unspecified: Secondary | ICD-10-CM | POA: Diagnosis not present

## 2023-08-02 DIAGNOSIS — Z9181 History of falling: Secondary | ICD-10-CM | POA: Diagnosis not present

## 2023-08-02 DIAGNOSIS — N390 Urinary tract infection, site not specified: Secondary | ICD-10-CM | POA: Diagnosis not present

## 2023-08-02 DIAGNOSIS — S2241XD Multiple fractures of ribs, right side, subsequent encounter for fracture with routine healing: Secondary | ICD-10-CM | POA: Diagnosis not present

## 2023-08-07 DIAGNOSIS — N390 Urinary tract infection, site not specified: Secondary | ICD-10-CM | POA: Diagnosis not present

## 2023-08-07 DIAGNOSIS — E7800 Pure hypercholesterolemia, unspecified: Secondary | ICD-10-CM | POA: Diagnosis not present

## 2023-08-07 DIAGNOSIS — Z9181 History of falling: Secondary | ICD-10-CM | POA: Diagnosis not present

## 2023-08-07 DIAGNOSIS — S2241XD Multiple fractures of ribs, right side, subsequent encounter for fracture with routine healing: Secondary | ICD-10-CM | POA: Diagnosis not present

## 2023-08-07 DIAGNOSIS — I10 Essential (primary) hypertension: Secondary | ICD-10-CM | POA: Diagnosis not present

## 2023-08-09 ENCOUNTER — Encounter: Payer: Self-pay | Admitting: Family Medicine

## 2023-08-09 ENCOUNTER — Ambulatory Visit (INDEPENDENT_AMBULATORY_CARE_PROVIDER_SITE_OTHER): Payer: Medicare Other | Admitting: Family Medicine

## 2023-08-09 ENCOUNTER — Ambulatory Visit: Payer: Medicare Other | Admitting: Physician Assistant

## 2023-08-09 VITALS — BP 136/82 | HR 85 | Wt 94.0 lb

## 2023-08-09 DIAGNOSIS — N3000 Acute cystitis without hematuria: Secondary | ICD-10-CM | POA: Diagnosis not present

## 2023-08-09 DIAGNOSIS — R296 Repeated falls: Secondary | ICD-10-CM | POA: Diagnosis not present

## 2023-08-09 NOTE — Progress Notes (Signed)
BP 136/82   Pulse 85   Wt 94 lb (42.6 kg)   SpO2 93%   BMI 18.36 kg/m    Subjective:   Patient ID: Kristi Sawyer, female    DOB: 06-14-1926, 89 y.o.   MRN: [id number]  HPI: Kristi Sawyer is a 89 y.o. female presenting on 08/09/2023 for Hospitalization Follow-up (Fall, rib fractures)   HPI Fall and fracture and UTI Patient was in the hospital for 2 different occasions, she went in on 07/26/2023 for a fall and UTI and dehydration was treated for that and was discharged on 07/29/2023 she went home she had another fall that was deemed to mechanical fall.  She has been ordered for home PT and is doing physical therapy at home and has already done it twice this week and has another one coming up tomorrow and they are working on strengthening.  She says she just feels weak in her lower extremities and that is why she has had these recurrent falls.  She does have a walker and she uses it consistently but she still had a fall despite that.  She says she still has a little bit of rib pain on that side but feeling a lot better denies any urinary issues.   Relevant past medical, surgical, family and social history reviewed and updated as indicated. Interim medical history since our last visit reviewed. Allergies and medications reviewed and updated.  Review of Systems  Constitutional:  Negative for chills and fever.  HENT:  Negative for congestion, ear discharge and ear pain.   Eyes:  Negative for redness and visual disturbance.  Respiratory:  Negative for chest tightness and shortness of breath.   Cardiovascular:  Positive for chest pain (Right-sided rib pain). Negative for leg swelling.  Genitourinary:  Negative for difficulty urinating and dysuria.  Musculoskeletal:  Negative for back pain and gait problem.  Skin:  Negative for rash.  Neurological:  Positive for weakness. Negative for light-headedness and headaches.  Psychiatric/Behavioral:  Negative for agitation and behavioral problems.    All other systems reviewed and are negative.   Per HPI unless specifically indicated above   Allergies as of 08/09/2023       Reactions   Prevnar 13 [pneumococcal 13-val Conj Vacc] Hives   Red/streaking arm   Penicillins Hives   Tetanus Toxoids Other (See Comments)   Area of injection was red and streaked.   Codeine Nausea Only   Sulfonamide Derivatives Nausea Only        Medication List        Accurate as of August 09, 2023  2:59 PM. If you have any questions, ask your nurse or doctor.          acetaminophen 325 MG tablet Commonly known as: TYLENOL Take 2 tablets (650 mg total) by mouth every 6 (six) hours as needed for mild pain (or Fever >/= 101).   amLODipine 5 MG tablet Commonly known as: NORVASC Take 1 tablet (5 mg total) by mouth daily.   CALTRATE PLUS PO Take 1 tablet by mouth daily.   CENTRUM SILVER PO Take 1 tablet by mouth daily.   CVS Vitamin C 500 MG tablet Generic drug: ascorbic acid TAKE 1 TABLET (500 MG TOTAL) BY MOUTH DAILY.   Fish Oil 1000 MG Caps Take 1 capsule by mouth 3 (three) times daily.   Gemtesa 75 MG Tabs Generic drug: Vibegron Take 1 tablet by mouth daily.   lidocaine 5 % Commonly known as: LIDODERM Place  1 patch onto the skin daily. Remove & Discard patch within 12 hours or as directed by MD   metoCLOPramide 10 MG tablet Commonly known as: REGLAN TAKE 1 TABLET BY MOUTH EVERYDAY AT BEDTIME   omeprazole 20 MG capsule Commonly known as: PRILOSEC PLEASE MAKE A FOLLOW UP APPOINTMENT. (TAKE1-2 CAPSULES BY MOUTH DAILY(20-40MG )   polyethylene glycol 17 g packet Commonly known as: MIRALAX / GLYCOLAX Take 17 g by mouth 2 (two) times daily as needed for severe constipation.   rosuvastatin 5 MG tablet Commonly known as: CRESTOR TAKE 1 TABLET BY MOUTH ON MONDAY, WEDNESDAY, AND FRIDAY   Vitamin D 50 MCG (2000 UT) Caps Take by mouth as directed. Take 1 tablet daily except none on saturdays and sundays   zinc sulfate 220  (50 Zn) MG capsule TAKE 1 CAPSULE BY MOUTH EVERY DAY         Objective:   BP 136/82   Pulse 85   Wt 94 lb (42.6 kg)   SpO2 93%   BMI 18.36 kg/m   Wt Readings from Last 3 Encounters:  08/09/23 94 lb (42.6 kg)  07/27/23 95 lb 14.4 oz (43.5 kg)  05/29/23 96 lb 12.8 oz (43.9 kg)    Physical Exam Vitals and nursing note reviewed.  Constitutional:      General: She is not in acute distress.    Appearance: She is well-developed. She is not diaphoretic.  Eyes:     Conjunctiva/sclera: Conjunctivae normal.     Pupils: Pupils are equal, round, and reactive to light.  Cardiovascular:     Rate and Rhythm: Normal rate and regular rhythm.     Heart sounds: Normal heart sounds. No murmur heard. Pulmonary:     Effort: Pulmonary effort is normal. No respiratory distress.     Breath sounds: Normal breath sounds. No wheezing.  Musculoskeletal:        General: No tenderness. Normal range of motion.     Comments: 4 out of 5 strength in bilateral lower extremities Get up and go is significantly slower  Skin:    General: Skin is warm and dry.     Findings: No rash.  Neurological:     Mental Status: She is alert and oriented to person, place, and time.     Coordination: Coordination normal.  Psychiatric:        Behavior: Behavior normal.       Assessment & Plan:   Problem List Items Addressed This Visit       Genitourinary   UTI (urinary tract infection)   Other Visit Diagnoses     Recurrent falls    -  Primary   Relevant Orders   CBC with Differential/Platelet   CMP14+EGFR       Already has home PT, seems to be feeling a lot better, will check some blood counts to make sure she is not dehydrated. Follow up plan: Return if symptoms worsen or fail to improve.  Counseling provided for all of the vaccine components Orders Placed This Encounter  Procedures   CBC with Differential/Platelet   CMP14+EGFR    Arville Care, MD [facility] 08/09/2023, 2:59 PM

## 2023-08-10 DIAGNOSIS — Z9181 History of falling: Secondary | ICD-10-CM | POA: Diagnosis not present

## 2023-08-10 DIAGNOSIS — I10 Essential (primary) hypertension: Secondary | ICD-10-CM | POA: Diagnosis not present

## 2023-08-10 DIAGNOSIS — E7800 Pure hypercholesterolemia, unspecified: Secondary | ICD-10-CM | POA: Diagnosis not present

## 2023-08-10 DIAGNOSIS — S2241XD Multiple fractures of ribs, right side, subsequent encounter for fracture with routine healing: Secondary | ICD-10-CM | POA: Diagnosis not present

## 2023-08-10 DIAGNOSIS — N390 Urinary tract infection, site not specified: Secondary | ICD-10-CM | POA: Diagnosis not present

## 2023-08-10 LAB — CBC WITH DIFFERENTIAL/PLATELET
Basophils Absolute: 0.1 10*3/uL (ref 0.0–0.2)
Basos: 1 %
EOS (ABSOLUTE): 0 10*3/uL (ref 0.0–0.4)
Eos: 0 %
Hematocrit: 42.8 % (ref 34.0–46.6)
Hemoglobin: 13.8 g/dL (ref 11.1–15.9)
Immature Grans (Abs): 0.1 10*3/uL (ref 0.0–0.1)
Immature Granulocytes: 1 %
Lymphocytes Absolute: 2.2 10*3/uL (ref 0.7–3.1)
Lymphs: 23 %
MCH: 30.7 pg (ref 26.6–33.0)
MCHC: 32.2 g/dL (ref 31.5–35.7)
MCV: 95 fL (ref 79–97)
Monocytes Absolute: 1.1 10*3/uL — ABNORMAL HIGH (ref 0.1–0.9)
Monocytes: 12 %
Neutrophils Absolute: 6.2 10*3/uL (ref 1.4–7.0)
Neutrophils: 63 %
Platelets: 294 10*3/uL (ref 150–450)
RBC: 4.5 x10E6/uL (ref 3.77–5.28)
RDW: 12.5 % (ref 11.7–15.4)
WBC: 9.6 10*3/uL (ref 3.4–10.8)

## 2023-08-10 LAB — CMP14+EGFR
ALT: 16 [IU]/L (ref 0–32)
AST: 17 [IU]/L (ref 0–40)
Albumin: 4.2 g/dL (ref 3.6–4.6)
Alkaline Phosphatase: 50 [IU]/L (ref 44–121)
BUN/Creatinine Ratio: 21 (ref 12–28)
BUN: 12 mg/dL (ref 10–36)
Bilirubin Total: 0.3 mg/dL (ref 0.0–1.2)
CO2: 25 mmol/L (ref 20–29)
Calcium: 10 mg/dL (ref 8.7–10.3)
Chloride: 98 mmol/L (ref 96–106)
Creatinine, Ser: 0.56 mg/dL — ABNORMAL LOW (ref 0.57–1.00)
Globulin, Total: 1.9 g/dL (ref 1.5–4.5)
Glucose: 94 mg/dL (ref 70–99)
Potassium: 4.6 mmol/L (ref 3.5–5.2)
Sodium: 137 mmol/L (ref 134–144)
Total Protein: 6.1 g/dL (ref 6.0–8.5)
eGFR: 85 mL/min/{1.73_m2} (ref >59)

## 2023-08-13 DIAGNOSIS — N390 Urinary tract infection, site not specified: Secondary | ICD-10-CM | POA: Diagnosis not present

## 2023-08-13 DIAGNOSIS — E7800 Pure hypercholesterolemia, unspecified: Secondary | ICD-10-CM | POA: Diagnosis not present

## 2023-08-13 DIAGNOSIS — I10 Essential (primary) hypertension: Secondary | ICD-10-CM | POA: Diagnosis not present

## 2023-08-13 DIAGNOSIS — S2241XD Multiple fractures of ribs, right side, subsequent encounter for fracture with routine healing: Secondary | ICD-10-CM | POA: Diagnosis not present

## 2023-08-13 DIAGNOSIS — Z9181 History of falling: Secondary | ICD-10-CM | POA: Diagnosis not present

## 2023-08-16 ENCOUNTER — Ambulatory Visit: Payer: Medicare Other

## 2023-08-16 DIAGNOSIS — Z9181 History of falling: Secondary | ICD-10-CM | POA: Diagnosis not present

## 2023-08-16 DIAGNOSIS — S2241XD Multiple fractures of ribs, right side, subsequent encounter for fracture with routine healing: Secondary | ICD-10-CM

## 2023-08-16 DIAGNOSIS — E7800 Pure hypercholesterolemia, unspecified: Secondary | ICD-10-CM

## 2023-08-16 DIAGNOSIS — N390 Urinary tract infection, site not specified: Secondary | ICD-10-CM | POA: Diagnosis not present

## 2023-08-16 DIAGNOSIS — I10 Essential (primary) hypertension: Secondary | ICD-10-CM

## 2023-08-20 DIAGNOSIS — E7800 Pure hypercholesterolemia, unspecified: Secondary | ICD-10-CM | POA: Diagnosis not present

## 2023-08-20 DIAGNOSIS — Z9181 History of falling: Secondary | ICD-10-CM | POA: Diagnosis not present

## 2023-08-20 DIAGNOSIS — I10 Essential (primary) hypertension: Secondary | ICD-10-CM | POA: Diagnosis not present

## 2023-08-20 DIAGNOSIS — S2241XD Multiple fractures of ribs, right side, subsequent encounter for fracture with routine healing: Secondary | ICD-10-CM | POA: Diagnosis not present

## 2023-08-20 DIAGNOSIS — N390 Urinary tract infection, site not specified: Secondary | ICD-10-CM | POA: Diagnosis not present

## 2023-08-23 DIAGNOSIS — Z9181 History of falling: Secondary | ICD-10-CM | POA: Diagnosis not present

## 2023-08-23 DIAGNOSIS — N390 Urinary tract infection, site not specified: Secondary | ICD-10-CM | POA: Diagnosis not present

## 2023-08-23 DIAGNOSIS — S2241XD Multiple fractures of ribs, right side, subsequent encounter for fracture with routine healing: Secondary | ICD-10-CM | POA: Diagnosis not present

## 2023-08-23 DIAGNOSIS — I10 Essential (primary) hypertension: Secondary | ICD-10-CM | POA: Diagnosis not present

## 2023-08-23 DIAGNOSIS — E7800 Pure hypercholesterolemia, unspecified: Secondary | ICD-10-CM | POA: Diagnosis not present

## 2023-08-26 ENCOUNTER — Inpatient Hospital Stay (HOSPITAL_COMMUNITY)
Admission: EM | Admit: 2023-08-26 | Discharge: 2023-08-30 | DRG: 871 | Disposition: A | Discharge status: Home Health | Payer: Medicare Other | Attending: Internal Medicine | Admitting: Internal Medicine

## 2023-08-26 ENCOUNTER — Emergency Department (HOSPITAL_COMMUNITY): Payer: Medicare Other

## 2023-08-26 ENCOUNTER — Encounter (HOSPITAL_COMMUNITY): Payer: Self-pay

## 2023-08-26 ENCOUNTER — Other Ambulatory Visit: Payer: Self-pay

## 2023-08-26 DIAGNOSIS — Z743 Need for continuous supervision: Secondary | ICD-10-CM | POA: Diagnosis not present

## 2023-08-26 DIAGNOSIS — R636 Underweight: Secondary | ICD-10-CM | POA: Diagnosis present

## 2023-08-26 DIAGNOSIS — Z882 Allergy status to sulfonamides status: Secondary | ICD-10-CM

## 2023-08-26 DIAGNOSIS — S299XXA Unspecified injury of thorax, initial encounter: Secondary | ICD-10-CM | POA: Diagnosis not present

## 2023-08-26 DIAGNOSIS — K900 Celiac disease: Secondary | ICD-10-CM | POA: Diagnosis present

## 2023-08-26 DIAGNOSIS — J189 Pneumonia, unspecified organism: Secondary | ICD-10-CM | POA: Diagnosis not present

## 2023-08-26 DIAGNOSIS — Z90711 Acquired absence of uterus with remaining cervical stump: Secondary | ICD-10-CM

## 2023-08-26 DIAGNOSIS — R2689 Other abnormalities of gait and mobility: Secondary | ICD-10-CM | POA: Diagnosis not present

## 2023-08-26 DIAGNOSIS — Z0389 Encounter for observation for other suspected diseases and conditions ruled out: Secondary | ICD-10-CM | POA: Diagnosis not present

## 2023-08-26 DIAGNOSIS — I10 Essential (primary) hypertension: Secondary | ICD-10-CM | POA: Diagnosis not present

## 2023-08-26 DIAGNOSIS — W19XXXA Unspecified fall, initial encounter: Secondary | ICD-10-CM | POA: Diagnosis not present

## 2023-08-26 DIAGNOSIS — K219 Gastro-esophageal reflux disease without esophagitis: Secondary | ICD-10-CM | POA: Diagnosis not present

## 2023-08-26 DIAGNOSIS — I700 Atherosclerosis of aorta: Secondary | ICD-10-CM | POA: Diagnosis not present

## 2023-08-26 DIAGNOSIS — A419 Sepsis, unspecified organism: Secondary | ICD-10-CM | POA: Diagnosis not present

## 2023-08-26 DIAGNOSIS — R531 Weakness: Secondary | ICD-10-CM

## 2023-08-26 DIAGNOSIS — K2950 Unspecified chronic gastritis without bleeding: Secondary | ICD-10-CM | POA: Diagnosis present

## 2023-08-26 DIAGNOSIS — R918 Other nonspecific abnormal finding of lung field: Secondary | ICD-10-CM | POA: Diagnosis not present

## 2023-08-26 DIAGNOSIS — Z681 Body mass index (BMI) 19 or less, adult: Secondary | ICD-10-CM | POA: Diagnosis not present

## 2023-08-26 DIAGNOSIS — R296 Repeated falls: Secondary | ICD-10-CM | POA: Diagnosis not present

## 2023-08-26 DIAGNOSIS — E876 Hypokalemia: Secondary | ICD-10-CM | POA: Diagnosis not present

## 2023-08-26 DIAGNOSIS — Z79890 Hormone replacement therapy (postmenopausal): Secondary | ICD-10-CM

## 2023-08-26 DIAGNOSIS — R6510 Systemic inflammatory response syndrome (SIRS) of non-infectious origin without acute organ dysfunction: Secondary | ICD-10-CM | POA: Diagnosis present

## 2023-08-26 DIAGNOSIS — N3090 Cystitis, unspecified without hematuria: Secondary | ICD-10-CM | POA: Diagnosis not present

## 2023-08-26 DIAGNOSIS — E871 Hypo-osmolality and hyponatremia: Secondary | ICD-10-CM | POA: Diagnosis not present

## 2023-08-26 DIAGNOSIS — Z885 Allergy status to narcotic agent status: Secondary | ICD-10-CM | POA: Diagnosis not present

## 2023-08-26 DIAGNOSIS — Z887 Allergy status to serum and vaccine status: Secondary | ICD-10-CM | POA: Diagnosis not present

## 2023-08-26 DIAGNOSIS — Y92009 Unspecified place in unspecified non-institutional (private) residence as the place of occurrence of the external cause: Secondary | ICD-10-CM

## 2023-08-26 DIAGNOSIS — M47816 Spondylosis without myelopathy or radiculopathy, lumbar region: Secondary | ICD-10-CM | POA: Diagnosis not present

## 2023-08-26 DIAGNOSIS — R5381 Other malaise: Secondary | ICD-10-CM | POA: Diagnosis present

## 2023-08-26 DIAGNOSIS — Z801 Family history of malignant neoplasm of trachea, bronchus and lung: Secondary | ICD-10-CM

## 2023-08-26 DIAGNOSIS — E890 Postprocedural hypothyroidism: Secondary | ICD-10-CM | POA: Diagnosis present

## 2023-08-26 DIAGNOSIS — R260 Ataxic gait: Secondary | ICD-10-CM | POA: Diagnosis present

## 2023-08-26 DIAGNOSIS — F419 Anxiety disorder, unspecified: Secondary | ICD-10-CM | POA: Diagnosis present

## 2023-08-26 DIAGNOSIS — E785 Hyperlipidemia, unspecified: Secondary | ICD-10-CM | POA: Diagnosis not present

## 2023-08-26 DIAGNOSIS — Z880 Allergy status to penicillin: Secondary | ICD-10-CM | POA: Diagnosis not present

## 2023-08-26 DIAGNOSIS — E860 Dehydration: Secondary | ICD-10-CM | POA: Diagnosis not present

## 2023-08-26 DIAGNOSIS — S3991XA Unspecified injury of abdomen, initial encounter: Secondary | ICD-10-CM | POA: Diagnosis not present

## 2023-08-26 DIAGNOSIS — Z602 Problems related to living alone: Secondary | ICD-10-CM | POA: Diagnosis present

## 2023-08-26 DIAGNOSIS — N3000 Acute cystitis without hematuria: Secondary | ICD-10-CM | POA: Diagnosis not present

## 2023-08-26 DIAGNOSIS — M810 Age-related osteoporosis without current pathological fracture: Secondary | ICD-10-CM | POA: Diagnosis present

## 2023-08-26 DIAGNOSIS — S3993XA Unspecified injury of pelvis, initial encounter: Secondary | ICD-10-CM | POA: Diagnosis not present

## 2023-08-26 DIAGNOSIS — F411 Generalized anxiety disorder: Secondary | ICD-10-CM | POA: Diagnosis present

## 2023-08-26 DIAGNOSIS — Z79899 Other long term (current) drug therapy: Secondary | ICD-10-CM

## 2023-08-26 DIAGNOSIS — Z7189 Other specified counseling: Secondary | ICD-10-CM | POA: Diagnosis not present

## 2023-08-26 DIAGNOSIS — R54 Age-related physical debility: Secondary | ICD-10-CM | POA: Diagnosis present

## 2023-08-26 DIAGNOSIS — Z043 Encounter for examination and observation following other accident: Secondary | ICD-10-CM | POA: Diagnosis not present

## 2023-08-26 DIAGNOSIS — G629 Polyneuropathy, unspecified: Secondary | ICD-10-CM | POA: Diagnosis present

## 2023-08-26 DIAGNOSIS — Z515 Encounter for palliative care: Secondary | ICD-10-CM | POA: Diagnosis not present

## 2023-08-26 MED ORDER — LACTATED RINGERS IV BOLUS (SEPSIS)
500.0000 mL | Freq: Once | INTRAVENOUS | Status: AC
  Administered 2023-08-27: 500 mL via INTRAVENOUS

## 2023-08-26 NOTE — ED Provider Notes (Signed)
[place] EMERGENCY DEPARTMENT AT [facility] Provider Note   CSN: [id number] Arrival date & time: 08/26/23  2158     History {Add pertinent medical, surgical, social history, OB history to HPI:1} Chief Complaint  Patient presents with   Kristi Sawyer is a 89 y.o. female.   Fall  Patient presents after fall.  Medical history includes HLD, HTN, anxiety, GERD, osteoporosis.  Fall occurred earlier today, although she is not able to specify time.  She describes the fall as mechanical.  At the time, she was ambulating with her walker, which she does at all times.  She fell forward and landed on top of the walker.  She was laying on the ground with a walker under her chest.  She was unable to get up under her own power.  She pressed her life alert button and EMS responded.  Patient denies any areas of pain since the fall.  She does have an ongoing generalized weakness which she was hospitalized for last month.  She lives alone currently.  She has physical therapy come by 2 times per week.  She has intermittent help from her neighbors.  While in the ED, she required assistance with transfer to toilet.  She denies any other concerns other than her generalized weakness.      Home Medications Prior to Admission medications   Medication Sig Start Date End Date Taking? Authorizing Provider  acetaminophen (TYLENOL) 325 MG tablet Take 2 tablets (650 mg total) by mouth every 6 (six) hours as needed for mild pain (or Fever >/= 101). 12/20/21   Shon Hale, MD  amLODipine (NORVASC) 5 MG tablet Take 1 tablet (5 mg total) by mouth daily. 04/18/23   Dettinger, Elige Radon, MD  Calcium Carbonate-Vit D-Min (CALTRATE PLUS PO) Take 1 tablet by mouth daily.    [provider]  Cholecalciferol (VITAMIN D) 2000 UNITS CAPS Take by mouth as directed. Take 1 tablet daily except none on saturdays and sundays    [provider]  CVS VITAMIN C 500 MG tablet TAKE 1 TABLET (500 MG  TOTAL) BY MOUTH DAILY. 07/30/23   Dettinger, Elige Radon, MD  GEMTESA 75 MG TABS Take 1 tablet by mouth daily. 07/14/22   [provider]  lidocaine (LIDODERM) 5 % Place 1 patch onto the skin daily. Remove & Discard patch within 12 hours or as directed by MD 07/29/23   Miguel Rota, MD  metoCLOPramide (REGLAN) 10 MG tablet TAKE 1 TABLET BY MOUTH EVERYDAY AT BEDTIME 05/21/23   [name], MD  Multiple Vitamins-Minerals (CENTRUM SILVER PO) Take 1 tablet by mouth daily.    [provider]  Omega-3 Fatty Acids (FISH OIL) 1000 MG CAPS Take 1 capsule by mouth 3 (three) times daily.     [provider]  omeprazole (PRILOSEC) 20 MG capsule PLEASE MAKE A FOLLOW UP APPOINTMENT. (TAKE1-2 CAPSULES BY MOUTH DAILY(20-40MG ) 04/16/23   [name], PA-C  polyethylene glycol (MIRALAX / GLYCOLAX) 17 g packet Take 17 g by mouth 2 (two) times daily as needed for severe constipation. 07/29/23   Amin, Ankit C, MD  rosuvastatin (CRESTOR) 5 MG tablet TAKE 1 TABLET BY MOUTH ON MONDAY, WEDNESDAY, AND FRIDAY 04/18/23   Dettinger, Elige Radon, MD  zinc sulfate 220 (50 Zn) MG capsule TAKE 1 CAPSULE BY MOUTH EVERY DAY 03/07/23   Dettinger, Elige Radon, MD      Allergies    Prevnar 13 [pneumococcal 13-val conj vacc],  Penicillins, Tetanus toxoids, Codeine, and Sulfonamide derivatives    Review of Systems   Review of Systems  Neurological:  Positive for weakness (Generalized).  All other systems reviewed and are negative.   Physical Exam Updated Vital Signs BP (!) 153/80   Pulse 93   Temp 98.4 F (36.9 C) (Oral)   Resp 19   Ht 5' (1.524 m)   Wt 42.3 kg   SpO2 95%   BMI 18.21 kg/m  Physical Exam Vitals and nursing note reviewed.  Constitutional:      General: She is not in acute distress.    Appearance: Normal appearance. She is well-developed. She is not ill-appearing, toxic-appearing or diaphoretic.  HENT:     Head: Normocephalic and atraumatic.     Right Ear: External ear normal.     Left  Ear: External ear normal.     Nose: Nose normal.     Mouth/Throat:     Mouth: Mucous membranes are moist.  Eyes:     Extraocular Movements: Extraocular movements intact.     Conjunctiva/sclera: Conjunctivae normal.  Cardiovascular:     Rate and Rhythm: Normal rate and regular rhythm.     Heart sounds: No murmur heard. Pulmonary:     Effort: Pulmonary effort is normal. No respiratory distress.     Breath sounds: Normal breath sounds. No wheezing or rales.  Chest:     Chest wall: No tenderness.  Abdominal:     General: There is no distension.     Palpations: Abdomen is soft.     Tenderness: There is no abdominal tenderness.  Musculoskeletal:        General: No swelling or deformity. Normal range of motion.     Cervical back: Normal range of motion and neck supple.     Right lower leg: No edema.     Left lower leg: No edema.  Skin:    General: Skin is warm and dry.     Coloration: Skin is not jaundiced or pale.  Neurological:     General: No focal deficit present.     Mental Status: She is alert and oriented to person, place, and time.  Psychiatric:        Mood and Affect: Mood normal.        Behavior: Behavior normal.     ED Results / Procedures / Treatments   Labs (all labs ordered are listed, but only abnormal results are displayed) Labs Reviewed - No data to display  EKG None  Radiology No results found.  Procedures Procedures  {Document cardiac monitor, telemetry assessment procedure when appropriate:1}  Medications Ordered in ED Medications - No data to display  ED Course/ Medical Decision Making/ A&P   {   Click here for ABCD2, HEART and other calculatorsREFRESH Note before signing :1}                              Medical Decision Making  This patient presents to the ED for concern of ***, this involves an extensive number of treatment options, and is a complaint that carries with it a high risk of complications and morbidity.  The differential  diagnosis includes ***   Co morbidities that complicate the patient evaluation  ***   Additional history obtained:  Additional history obtained from *** External records from outside source obtained and reviewed including ***   Lab Tests:  I Ordered, and personally interpreted labs.  The pertinent  results include:  ***   Imaging Studies ordered:  I ordered imaging studies including ***  I independently visualized and interpreted imaging which showed *** I agree with the radiologist interpretation   Cardiac Monitoring: / EKG:  The patient was maintained on a cardiac monitor.  I personally viewed and interpreted the cardiac monitored which showed an underlying rhythm of: ***   Consultations Obtained:  I requested consultation with the ***,  and discussed lab and imaging findings as well as pertinent plan - they recommend: ***   Problem List / ED Course / Critical interventions / Medication management  Patient presenting for generalized weakness.  Although she did suffer a fall earlier today, she denies any areas of pain since the fall.  She has no external evidence of injury on exam.  She has no focal neurologic deficits.  Workup was initiated to identify possible reversible causes of her generalized weakness.***. I ordered medication including ***  for ***  Reevaluation of the patient after these medicines showed that the patient {resolved/improved/worsened:23923::"improved"} I have reviewed the patients home medicines and have made adjustments as needed   Social Determinants of Health:  ***   Test / Admission - Considered:  ***   {Document critical care time when appropriate:1} {Document review of labs and clinical decision tools ie heart score, Chads2Vasc2 etc:1}  {Document your independent review of radiology images, and any outside records:1} {Document your discussion with family members, caretakers, and with consultants:1} {Document social determinants of  health affecting pt's care:1} {Document your decision making why or why not admission, treatments were needed:1} Final Clinical Impression(s) / ED Diagnoses Final diagnoses:  None    Rx / DC Orders ED Discharge Orders     None

## 2023-08-26 NOTE — ED Triage Notes (Signed)
Pt called [facility] from home c/o fall and weakness, unable to get up. Pt pushed her Life Alert button to get EMS assistance. Per [facility], Pt was found on the floor when they arrived. Pt has Hx of same complaint.

## 2023-08-27 ENCOUNTER — Encounter (HOSPITAL_COMMUNITY): Payer: Self-pay | Admitting: Family Medicine

## 2023-08-27 ENCOUNTER — Emergency Department (HOSPITAL_COMMUNITY): Payer: Medicare Other

## 2023-08-27 ENCOUNTER — Observation Stay (HOSPITAL_COMMUNITY): Payer: Medicare Other

## 2023-08-27 DIAGNOSIS — W19XXXA Unspecified fall, initial encounter: Secondary | ICD-10-CM | POA: Diagnosis not present

## 2023-08-27 DIAGNOSIS — Z515 Encounter for palliative care: Secondary | ICD-10-CM | POA: Diagnosis not present

## 2023-08-27 DIAGNOSIS — Z887 Allergy status to serum and vaccine status: Secondary | ICD-10-CM | POA: Diagnosis not present

## 2023-08-27 DIAGNOSIS — K900 Celiac disease: Secondary | ICD-10-CM | POA: Diagnosis present

## 2023-08-27 DIAGNOSIS — E785 Hyperlipidemia, unspecified: Secondary | ICD-10-CM | POA: Diagnosis present

## 2023-08-27 DIAGNOSIS — Z681 Body mass index (BMI) 19 or less, adult: Secondary | ICD-10-CM | POA: Diagnosis not present

## 2023-08-27 DIAGNOSIS — S3991XA Unspecified injury of abdomen, initial encounter: Secondary | ICD-10-CM | POA: Diagnosis not present

## 2023-08-27 DIAGNOSIS — R531 Weakness: Secondary | ICD-10-CM | POA: Diagnosis not present

## 2023-08-27 DIAGNOSIS — R6510 Systemic inflammatory response syndrome (SIRS) of non-infectious origin without acute organ dysfunction: Secondary | ICD-10-CM | POA: Diagnosis not present

## 2023-08-27 DIAGNOSIS — A419 Sepsis, unspecified organism: Secondary | ICD-10-CM | POA: Diagnosis not present

## 2023-08-27 DIAGNOSIS — S299XXA Unspecified injury of thorax, initial encounter: Secondary | ICD-10-CM | POA: Diagnosis not present

## 2023-08-27 DIAGNOSIS — K219 Gastro-esophageal reflux disease without esophagitis: Secondary | ICD-10-CM | POA: Diagnosis present

## 2023-08-27 DIAGNOSIS — S3993XA Unspecified injury of pelvis, initial encounter: Secondary | ICD-10-CM | POA: Diagnosis not present

## 2023-08-27 DIAGNOSIS — E860 Dehydration: Secondary | ICD-10-CM | POA: Diagnosis present

## 2023-08-27 DIAGNOSIS — N3090 Cystitis, unspecified without hematuria: Secondary | ICD-10-CM | POA: Diagnosis present

## 2023-08-27 DIAGNOSIS — J189 Pneumonia, unspecified organism: Secondary | ICD-10-CM | POA: Diagnosis present

## 2023-08-27 DIAGNOSIS — Z801 Family history of malignant neoplasm of trachea, bronchus and lung: Secondary | ICD-10-CM | POA: Diagnosis not present

## 2023-08-27 DIAGNOSIS — Z90711 Acquired absence of uterus with remaining cervical stump: Secondary | ICD-10-CM | POA: Diagnosis not present

## 2023-08-27 DIAGNOSIS — E876 Hypokalemia: Secondary | ICD-10-CM | POA: Diagnosis not present

## 2023-08-27 DIAGNOSIS — I6782 Cerebral ischemia: Secondary | ICD-10-CM | POA: Diagnosis not present

## 2023-08-27 DIAGNOSIS — Z7189 Other specified counseling: Secondary | ICD-10-CM | POA: Diagnosis not present

## 2023-08-27 DIAGNOSIS — E890 Postprocedural hypothyroidism: Secondary | ICD-10-CM | POA: Diagnosis present

## 2023-08-27 DIAGNOSIS — E871 Hypo-osmolality and hyponatremia: Secondary | ICD-10-CM | POA: Diagnosis not present

## 2023-08-27 DIAGNOSIS — Y92009 Unspecified place in unspecified non-institutional (private) residence as the place of occurrence of the external cause: Secondary | ICD-10-CM | POA: Diagnosis not present

## 2023-08-27 DIAGNOSIS — R918 Other nonspecific abnormal finding of lung field: Secondary | ICD-10-CM | POA: Diagnosis not present

## 2023-08-27 DIAGNOSIS — R296 Repeated falls: Secondary | ICD-10-CM | POA: Diagnosis present

## 2023-08-27 DIAGNOSIS — Z882 Allergy status to sulfonamides status: Secondary | ICD-10-CM | POA: Diagnosis not present

## 2023-08-27 DIAGNOSIS — F419 Anxiety disorder, unspecified: Secondary | ICD-10-CM | POA: Diagnosis present

## 2023-08-27 DIAGNOSIS — M810 Age-related osteoporosis without current pathological fracture: Secondary | ICD-10-CM | POA: Diagnosis present

## 2023-08-27 DIAGNOSIS — Z880 Allergy status to penicillin: Secondary | ICD-10-CM | POA: Diagnosis not present

## 2023-08-27 DIAGNOSIS — Z885 Allergy status to narcotic agent status: Secondary | ICD-10-CM | POA: Diagnosis not present

## 2023-08-27 DIAGNOSIS — I10 Essential (primary) hypertension: Secondary | ICD-10-CM | POA: Diagnosis present

## 2023-08-27 DIAGNOSIS — Z79890 Hormone replacement therapy (postmenopausal): Secondary | ICD-10-CM | POA: Diagnosis not present

## 2023-08-27 DIAGNOSIS — R2689 Other abnormalities of gait and mobility: Secondary | ICD-10-CM | POA: Diagnosis present

## 2023-08-27 LAB — HEPATIC FUNCTION PANEL
ALT: 55 U/L — ABNORMAL HIGH (ref 0–44)
AST: 64 U/L — ABNORMAL HIGH (ref 15–41)
Albumin: 3.8 g/dL (ref 3.5–5.0)
Alkaline Phosphatase: 48 U/L (ref 38–126)
Bilirubin, Direct: 0.1 mg/dL (ref 0.0–0.2)
Indirect Bilirubin: 0.4 mg/dL (ref 0.3–0.9)
Total Bilirubin: 0.5 mg/dL (ref 0.3–1.2)
Total Protein: 6 g/dL — ABNORMAL LOW (ref 6.5–8.1)

## 2023-08-27 LAB — CBC WITH DIFFERENTIAL/PLATELET
Abs Immature Granulocytes: 0.11 10*3/uL — ABNORMAL HIGH (ref 0.00–0.07)
Basophils Absolute: 0 10*3/uL (ref 0.0–0.1)
Basophils Relative: 0 %
Eosinophils Absolute: 0 10*3/uL (ref 0.0–0.5)
Eosinophils Relative: 0 %
HCT: 39.7 % (ref 36.0–46.0)
Hemoglobin: 13.4 g/dL (ref 12.0–15.0)
Immature Granulocytes: 1 %
Lymphocytes Relative: 6 %
Lymphs Abs: 1 10*3/uL (ref 0.7–4.0)
MCH: 31.1 pg (ref 26.0–34.0)
MCHC: 33.8 g/dL (ref 30.0–36.0)
MCV: 92.1 fL (ref 80.0–100.0)
Monocytes Absolute: 1.5 10*3/uL — ABNORMAL HIGH (ref 0.1–1.0)
Monocytes Relative: 8 %
Neutro Abs: 15.6 10*3/uL — ABNORMAL HIGH (ref 1.7–7.7)
Neutrophils Relative %: 85 %
Platelets: 250 10*3/uL (ref 150–400)
RBC: 4.31 MIL/uL (ref 3.87–5.11)
RDW: 12.9 % (ref 11.5–15.5)
WBC: 18.2 10*3/uL — ABNORMAL HIGH (ref 4.0–10.5)
nRBC: 0 % (ref 0.0–0.2)

## 2023-08-27 LAB — URINALYSIS, W/ REFLEX TO CULTURE (INFECTION SUSPECTED)
Bilirubin Urine: NEGATIVE
Glucose, UA: NEGATIVE mg/dL
Ketones, ur: NEGATIVE mg/dL
Nitrite: NEGATIVE
Protein, ur: 30 mg/dL — AB
Specific Gravity, Urine: 1.006 (ref 1.005–1.030)
pH: 6 (ref 5.0–8.0)

## 2023-08-27 LAB — BASIC METABOLIC PANEL
Anion gap: 10 (ref 5–15)
BUN: 12 mg/dL (ref 8–23)
CO2: 24 mmol/L (ref 22–32)
Calcium: 9 mg/dL (ref 8.9–10.3)
Chloride: 92 mmol/L — ABNORMAL LOW (ref 98–111)
Creatinine, Ser: 0.53 mg/dL (ref 0.44–1.00)
GFR, Estimated: >60 mL/min (ref >60)
Glucose, Bld: 132 mg/dL — ABNORMAL HIGH (ref 70–99)
Potassium: 3.5 mmol/L (ref 3.5–5.1)
Sodium: 126 mmol/L — ABNORMAL LOW (ref 135–145)

## 2023-08-27 LAB — PHOSPHORUS: Phosphorus: 2.4 mg/dL — ABNORMAL LOW (ref 2.5–4.6)

## 2023-08-27 LAB — CK TOTAL AND CKMB (NOT AT ARMC)
CK, MB: 12.1 ng/mL — ABNORMAL HIGH (ref 0.5–5.0)
Total CK: 722 U/L — ABNORMAL HIGH (ref 38–234)

## 2023-08-27 LAB — TROPONIN I (HIGH SENSITIVITY)
Troponin I (High Sensitivity): 54 ng/L — ABNORMAL HIGH (ref <18)
Troponin I (High Sensitivity): 59 ng/L — ABNORMAL HIGH (ref <18)
Troponin I (High Sensitivity): 84 ng/L — ABNORMAL HIGH (ref <18)
Troponin I (High Sensitivity): 85 ng/L — ABNORMAL HIGH (ref <18)

## 2023-08-27 LAB — MAGNESIUM: Magnesium: 1.9 mg/dL (ref 1.7–2.4)

## 2023-08-27 LAB — PROCALCITONIN: Procalcitonin: <0.1 ng/mL

## 2023-08-27 LAB — LACTIC ACID, PLASMA: Lactic Acid, Venous: 1.3 mmol/L (ref 0.5–1.9)

## 2023-08-27 MED ORDER — HEPARIN SODIUM (PORCINE) 5000 UNIT/ML IJ SOLN
5000.0000 [IU] | Freq: Three times a day (TID) | INTRAMUSCULAR | Status: DC
  Administered 2023-08-27 – 2023-08-30 (×9): 5000 [IU] via SUBCUTANEOUS
  Filled 2023-08-27 (×10): qty 1

## 2023-08-27 MED ORDER — LIDOCAINE 5 % EX PTCH
1.0000 | MEDICATED_PATCH | CUTANEOUS | Status: DC
  Filled 2023-08-27 (×4): qty 1

## 2023-08-27 MED ORDER — SODIUM CHLORIDE 0.9 % IV SOLN
1.0000 g | Freq: Once | INTRAVENOUS | Status: AC
  Administered 2023-08-27: 1 g via INTRAVENOUS
  Filled 2023-08-27: qty 10

## 2023-08-27 MED ORDER — IOHEXOL 350 MG/ML SOLN
100.0000 mL | Freq: Once | INTRAVENOUS | Status: AC | PRN
  Administered 2023-08-27: 75 mL via INTRAVENOUS

## 2023-08-27 MED ORDER — LEVALBUTEROL HCL 0.63 MG/3ML IN NEBU: 0.6300 mg | INHALATION_SOLUTION | Freq: Four times a day (QID) | RESPIRATORY_TRACT | Status: DC | PRN

## 2023-08-27 MED ORDER — ONDANSETRON HCL 4 MG/2ML IJ SOLN: 4.0000 mg | Freq: Four times a day (QID) | INTRAMUSCULAR | Status: DC | PRN

## 2023-08-27 MED ORDER — SENNOSIDES-DOCUSATE SODIUM 8.6-50 MG PO TABS: 1.0000 | ORAL_TABLET | Freq: Every evening | ORAL | Status: DC | PRN

## 2023-08-27 MED ORDER — HYDROMORPHONE HCL 1 MG/ML IJ SOLN: 0.5000 mg | INTRAMUSCULAR | Status: DC | PRN

## 2023-08-27 MED ORDER — OXYCODONE HCL 5 MG PO TABS
5.0000 mg | ORAL_TABLET | ORAL | Status: DC | PRN
  Administered 2023-08-29: 5 mg via ORAL
  Filled 2023-08-27: qty 1

## 2023-08-27 MED ORDER — SODIUM CHLORIDE 0.9 % IV SOLN: INTRAVENOUS | Status: DC

## 2023-08-27 MED ORDER — ADULT MULTIVITAMIN W/MINERALS CH
1.0000 | ORAL_TABLET | Freq: Every day | ORAL | Status: DC
  Administered 2023-08-27 – 2023-08-30 (×4): 1 via ORAL
  Filled 2023-08-27 (×4): qty 1

## 2023-08-27 MED ORDER — ASPIRIN 81 MG PO CHEW
324.0000 mg | CHEWABLE_TABLET | Freq: Once | ORAL | Status: AC
  Administered 2023-08-27: 324 mg via ORAL
  Filled 2023-08-27: qty 4

## 2023-08-27 MED ORDER — TRAZODONE HCL 50 MG PO TABS
25.0000 mg | ORAL_TABLET | Freq: Every evening | ORAL | Status: DC | PRN
  Administered 2023-08-29: 25 mg via ORAL
  Filled 2023-08-27: qty 1

## 2023-08-27 MED ORDER — FLEET ENEMA RE ENEM: 1.0000 | ENEMA | Freq: Once | RECTAL | Status: DC | PRN

## 2023-08-27 MED ORDER — BISACODYL 5 MG PO TBEC: 5.0000 mg | DELAYED_RELEASE_TABLET | Freq: Every day | ORAL | Status: DC | PRN

## 2023-08-27 MED ORDER — AMLODIPINE BESYLATE 5 MG PO TABS
5.0000 mg | ORAL_TABLET | Freq: Every day | ORAL | Status: DC
  Administered 2023-08-27 – 2023-08-30 (×4): 5 mg via ORAL
  Filled 2023-08-27 (×4): qty 1

## 2023-08-27 MED ORDER — SODIUM CHLORIDE 0.9% FLUSH: 3.0000 mL | INTRAVENOUS | Status: DC | PRN

## 2023-08-27 MED ORDER — ZINC SULFATE 220 (50 ZN) MG PO CAPS
220.0000 mg | ORAL_CAPSULE | Freq: Every day | ORAL | Status: DC
  Administered 2023-08-27 – 2023-08-30 (×4): 220 mg via ORAL
  Filled 2023-08-27 (×4): qty 1

## 2023-08-27 MED ORDER — METOCLOPRAMIDE HCL 10 MG PO TABS
5.0000 mg | ORAL_TABLET | Freq: Every day | ORAL | Status: DC
  Administered 2023-08-27 – 2023-08-29 (×3): 5 mg via ORAL
  Filled 2023-08-27 (×3): qty 1

## 2023-08-27 MED ORDER — SODIUM CHLORIDE 0.9 % IV SOLN
500.0000 mg | Freq: Once | INTRAVENOUS | Status: AC
  Administered 2023-08-27: 500 mg via INTRAVENOUS
  Filled 2023-08-27: qty 5

## 2023-08-27 MED ORDER — ONDANSETRON HCL 4 MG PO TABS: 4.0000 mg | ORAL_TABLET | Freq: Four times a day (QID) | ORAL | Status: DC | PRN

## 2023-08-27 MED ORDER — SODIUM CHLORIDE 0.9 % IV SOLN
1.0000 g | INTRAVENOUS | Status: DC
  Administered 2023-08-28 – 2023-08-30 (×3): 1 g via INTRAVENOUS
  Filled 2023-08-27 (×3): qty 10

## 2023-08-27 MED ORDER — CENTRUM SILVER PO CHEW: CHEWABLE_TABLET | Freq: Every day | ORAL | Status: DC

## 2023-08-27 MED ORDER — VITAMIN C 500 MG PO TABS
500.0000 mg | ORAL_TABLET | Freq: Every day | ORAL | Status: DC
  Administered 2023-08-27 – 2023-08-30 (×4): 500 mg via ORAL
  Filled 2023-08-27 (×4): qty 1

## 2023-08-27 MED ORDER — ASPIRIN 81 MG PO TBEC
81.0000 mg | DELAYED_RELEASE_TABLET | Freq: Every day | ORAL | Status: DC
  Administered 2023-08-28 – 2023-08-30 (×3): 81 mg via ORAL
  Filled 2023-08-27 (×4): qty 1

## 2023-08-27 MED ORDER — ACETAMINOPHEN 325 MG PO TABS: 650.0000 mg | ORAL_TABLET | Freq: Four times a day (QID) | ORAL | Status: DC | PRN

## 2023-08-27 MED ORDER — ACETAMINOPHEN 650 MG RE SUPP: 650.0000 mg | Freq: Four times a day (QID) | RECTAL | Status: DC | PRN

## 2023-08-27 MED ORDER — SODIUM CHLORIDE 0.9% FLUSH
3.0000 mL | Freq: Two times a day (BID) | INTRAVENOUS | Status: DC
  Administered 2023-08-27 – 2023-08-29 (×4): 3 mL via INTRAVENOUS

## 2023-08-27 MED ORDER — HYDRALAZINE HCL 20 MG/ML IJ SOLN: 10.0000 mg | INTRAMUSCULAR | Status: DC | PRN

## 2023-08-27 MED ORDER — VITAMIN D 25 MCG (1000 UNIT) PO TABS
2000.0000 [IU] | ORAL_TABLET | ORAL | Status: DC
  Administered 2023-08-27 – 2023-08-30 (×4): 2000 [IU] via ORAL
  Filled 2023-08-27 (×4): qty 2

## 2023-08-27 MED ORDER — SODIUM CHLORIDE 0.9 % IV SOLN: INTRAVENOUS | Status: AC

## 2023-08-27 MED ORDER — PANTOPRAZOLE SODIUM 40 MG PO TBEC
40.0000 mg | DELAYED_RELEASE_TABLET | Freq: Every day | ORAL | Status: DC
  Administered 2023-08-27 – 2023-08-30 (×4): 40 mg via ORAL
  Filled 2023-08-27 (×4): qty 1

## 2023-08-27 MED ORDER — SODIUM CHLORIDE 0.9 % IV SOLN
500.0000 mg | INTRAVENOUS | Status: DC
  Administered 2023-08-28 – 2023-08-30 (×3): 500 mg via INTRAVENOUS
  Filled 2023-08-27 (×3): qty 5

## 2023-08-27 MED ORDER — IPRATROPIUM BROMIDE 0.02 % IN SOLN: 0.5000 mg | Freq: Four times a day (QID) | RESPIRATORY_TRACT | Status: DC | PRN

## 2023-08-27 NOTE — TOC Initial Note (Signed)
Transition of Care [facility]) - Initial/Assessment Note    Patient Details  Name: Kristi Sawyer MRN: [id number] Date of Birth: 1926-06-24  Transition of Care [facility]) CM/SW Contact:    Elliot Gault, LCSW Phone Number: 08/27/2023, 3:26 PM  Clinical Narrative:                  Pt admitted from home. PT recommending HHPT at dc. Pt was recently discharged with [facility] in place.   Spoke with pt today to review dc planning. Pt states that she would like to return home with continued [facility] services from [place]. She does not think she will have any other TOC needs for dc.  Updated Cory at [place]. Will follow.  Expected Discharge Plan: Home w Home Health Services Barriers to Discharge: Continued Medical Work up   Patient Goals and CMS Choice Patient states their goals for this hospitalization and ongoing recovery are:: return home CMS Medicare.gov Compare Post Acute Care list provided to:: Patient Choice offered to / list presented to : Patient      Expected Discharge Plan and Services In-house Referral: Clinical Social Work   Post Acute Care Choice: Resumption of Svcs/PTA Provider Living arrangements for the past 2 months: Single Family Home                                      Prior Living Arrangements/Services Living arrangements for the past 2 months: Single Family Home Lives with:: Self Patient language and need for interpreter reviewed:: Yes Do you feel safe going back to the place where you live?: Yes      Need for Family Participation in Patient Care: No (Comment) Care giver support system in place?: Yes (comment) Current home services: DME, Home PT Criminal Activity/Legal Involvement Pertinent to Current Situation/Hospitalization: No - Comment as needed  Activities of Daily Living   ADL Screening (condition at time of admission) Independently performs ADLs?: Yes (appropriate for developmental age) Is the patient deaf or have difficulty hearing?: No Does the  patient have difficulty seeing, even when wearing glasses/contacts?: No Does the patient have difficulty concentrating, remembering, or making decisions?: No  Permission Sought/Granted                  Emotional Assessment Appearance:: Appears stated age Attitude/Demeanor/Rapport: Engaged Affect (typically observed): Pleasant Orientation: : Oriented to Self, Oriented to Place, Oriented to  Time, Oriented to Situation Alcohol / Substance Use: Not Applicable Psych Involvement: No (comment)  Admission diagnosis:  Hyponatremia [E87.1] Generalized weakness [R53.1] Cystitis [N30.90] Fall, initial encounter [W19.XXXA] Pneumonia of right upper lobe due to infectious organism [J18.9] Patient Active Problem List   Diagnosis Date Noted   Fall at home, initial encounter 08/27/2023   CAP (community acquired pneumonia) 08/27/2023    Class: Acute   SIRS (systemic inflammatory response syndrome) (HCC) 08/27/2023   Hyponatremia 08/27/2023   Generalized weakness 08/27/2023   Hypokalemia 07/27/2023   Debility 07/26/2023   Falls 12/19/2021   Ataxic gait    UTI (urinary tract infection)    Physical deconditioning    Osteoporosis without current pathological fracture 11/23/2020   Hypertension 10/28/2017   Overactive bladder 06/18/2015   Hyperlipidemia 09/26/2010   ANXIETY 09/26/2010   GERD (gastroesophageal reflux disease) 09/26/2010   CELIAC SPRUE 09/26/2010   PCP:  Dettinger, Elige Radon, MD Pharmacy:   [facility] [phone] - [place], [place] - [address] [place] [address] Phone: [phone] Fax: [phone]     Social Determinants of Health (SDOH) Social History: SDOH Screenings   Food Insecurity: No Food Insecurity (08/27/2023)  Housing: Low Risk  (08/27/2023)  Transportation Needs: No Transportation Needs (08/27/2023)  Utilities: Not At Risk (08/27/2023)  Alcohol Screen: Low Risk  (12/15/2022)  Depression (PHQ2-9): Low Risk  (08/09/2023)   Financial Resource Strain: Low Risk  (12/15/2022)  Physical Activity: Insufficiently Active (12/15/2022)  Social Connections: Moderately Integrated (12/15/2022)  Stress: No Stress Concern Present (12/15/2022)  Tobacco Use: Low Risk  (08/27/2023)   SDOH Interventions:     Readmission Risk Interventions     No data to display

## 2023-08-27 NOTE — Assessment & Plan Note (Signed)
-   Accidental falls, with chronic debility, gait ataxia -Consulting PT OT for evaluation likely will need placement, rehab -Assist with all ADLs, -CT chest abdomen reviewed, no obvious signs of physical trauma

## 2023-08-27 NOTE — ED Notes (Signed)
Yellow socks and fall risk bracelet placed on pt

## 2023-08-27 NOTE — Assessment & Plan Note (Signed)
-   Withholding now, checking total CK due to falls -Anticipating discontinuing her medication as at this age risk outweighs the benefits

## 2023-08-27 NOTE — Assessment & Plan Note (Signed)
-  Resuming home medication with caution -As needed hydralazine

## 2023-08-27 NOTE — Consult Note (Signed)
Consultation Note Date: 08/27/2023   Patient Name: Kristi Sawyer  DOB: 1926/03/11  MRN: [id number]  Age / Sex: 89 y.o., female  PCP: Dettinger, Elige Radon, MD Referring Physician: Lilyan Gilford, DO  Reason for Consultation: Establishing goals of care  HPI/Patient Profile: 89 y.o. female  with past medical history of HTN/HLD, anxiety, GERD, osteoporosis, recurrent falls with hospital stay 9/12 through 15 for fall with UTI and dehydration admitted on 08/26/2023 with SIRS with source likely pneumonia, hyponatremia, debility.   Clinical Assessment and Goals of Care: I have reviewed medical records including EPIC notes, labs and imaging, received report from RN, assessed the patient.  Kristi Sawyer is sitting up in the [place] chair in her room.  She greets me, making and mostly keeping eye contact.  She is alert and oriented x 3, able to make her needs known.  There is no family at bedside at this time.   We meet at the bedside to discuss diagnosis prognosis, GOC, EOL wishes, disposition and options.  I introduced Palliative Medicine as specialized medical care for people living with serious illness. It focuses on providing relief from the symptoms and stress of a serious illness. The goal is to improve quality of life for both the patient and the family.  We discussed a brief life review of the patient.  Kristi Sawyer tells me that she is a widow for 22 years.  She has no children.  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  She has been living independently, mostly managing ADLs/IADLs on her own.  She does have help from a friend, Epifanio Lesches.  We then focused on their current illness.  We talk about her SIRS, testing, time for results.  We talked about her debility and the plan for PT eval when stable.  She states that she is agreeable to short-term rehab with ultimate goal of returning  home.  The natural disease trajectory and expectations at EOL were discussed.  Advanced directives, concepts specific to code status, artifical feeding and hydration, and rehospitalization were considered and discussed.  We talked about the concept of "treat the treatable, but allow a natural passing".  Kristi Sawyer states that she has not considered these choices but feels that she should.  I encouraged her to hold these discussions with her healthcare surrogate.  Discussed the importance of continued conversation with family and the medical providers regarding overall plan of care and treatment options, ensuring decisions are within the context of the patient's values and GOCs. Questions and concerns were addressed.  The patient was encouraged to call with questions or concerns.  PMT will continue to support holistically.  Conference with attending, bedside nursing staff, transition of care team related to patient condition, needs, goals of care, disposition.   HCPOA HCPOA -Mrs. Schwalm states that her friend, Epifanio Lesches, is her healthcare surrogate.    SUMMARY OF RECOMMENDATIONS   At this point continue to treat the treatable Considering CODE STATUS Agreeable to short-term rehab if qualified Goal is  to return home   Code Status/Advance Care Planning: Full code - We talked about the concept of "treat the treatable, but allow a natural passing".  Kristi Sawyer states that she has not considered these choices but feels that she should.  I encouraged her to hold these discussions with her healthcare surrogate.  Symptom Management:  Per hospitalist, no additional needs at this time.  Palliative Prophylaxis:  Frequent Pain Assessment and Oral Care  Additional Recommendations (Limitations, Scope, Preferences): Full Scope Treatment  Psycho-social/Spiritual:  Desire for further Chaplaincy support:no Additional Recommendations: Caregiving  Support/Resources  Prognosis:  Unable to determine, based  on outcomes.  6 months or less would not be surprising based on advanced age, declining functional status.  Discharge Planning: Agreeable to short-term rehab with ultimate goal of returning home      Primary Diagnoses: Present on Admission:  Debility  GERD (gastroesophageal reflux disease)  Ataxic gait  ANXIETY  Hypertension  Hyperlipidemia  CAP (community acquired pneumonia)  SIRS (systemic inflammatory response syndrome) (HCC)  Hyponatremia   I have reviewed the medical record, interviewed the patient and family, and examined the patient. The following aspects are pertinent.  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    Neuropathy    Osteoporosis    Palpitations    Postmenopausal HRT (hormone replacement therapy)    Shingles    x 3   Thyroid tumor, benign    Social History   Socioeconomic History   Marital status: Widowed    Spouse name: Not on file   Number of children: 0   Years of education: 16   Highest education level: Bachelor's degree (e.g., BA, AB, BS)  Occupational History   Occupation: retired    Associate Professor: RETIRED  Tobacco Use   Smoking status: Never   Smokeless tobacco: Never  Vaping Use   Vaping status: Never Used  Substance and Sexual Activity   Alcohol use: No   Drug use: No   Sexual activity: Not Currently  Other Topics Concern   Not on file  Social History Narrative   WidowNo 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 - and then [employer] x 15 yrs - retiredWalks daily - exercise   Close with her large church family   Still driving and very independent   Social Determinants of Health   Financial Resource Strain: Low Risk  (12/15/2022)   Overall Financial Resource Strain (CARDIA)     Difficulty of Paying Living Expenses: Not hard at all  Food Insecurity: No Food Insecurity (08/27/2023)   Hunger Vital Sign    Worried About Running Out of Food in the Last Year: Never true    Ran Out of Food in the Last Year: Never true  Transportation Needs: No Transportation Needs (08/27/2023)   PRAPARE - Administrator, Civil Service (Medical): No    Lack of Transportation (Non-Medical): No  Physical Activity: Insufficiently Active (12/15/2022)   Exercise Vital Sign    Days of Exercise per Week: 3 days    Minutes of Exercise per Session: 30 min  Stress: No Stress Concern Present (12/15/2022)   [employer] of Occupational  Health - Occupational Stress Questionnaire    Feeling of Stress : Not at all  Social Connections: Moderately Integrated (12/15/2022)   Social Connection and Isolation Panel [NHANES]    Frequency of Communication with Friends and Family: More than three times a week    Frequency of Social Gatherings with Friends and Family: More than three times a week    Attends Religious Services: More than 4 times per year    Active Member of [employer] or Organizations: Yes    Attends Banker Meetings: More than 4 times per year    Marital Status: Widowed   Family History  Problem Relation Age of Onset   Healthy Mother    Lung cancer Father    Colon cancer Neg Hx    Stomach cancer Neg Hx    Esophageal cancer Neg Hx    Scheduled Meds:  amLODipine  5 mg Oral Daily   ascorbic acid  500 mg Oral Daily   aspirin EC  81 mg Oral Daily   cholecalciferol  2,000 Units Oral Once per day on Monday Tuesday Wednesday Thursday Friday   heparin  5,000 Units Subcutaneous Q8H   lidocaine  1 patch Transdermal Q24H   metoCLOPramide  5 mg Oral QHS   multivitamin with minerals  1 tablet Oral Daily   pantoprazole  40 mg Oral Daily   sodium chloride flush  3 mL Intravenous Q12H   zinc sulfate  220 mg Oral Daily   Continuous Infusions:  sodium chloride 100 mL/hr at  08/27/23 0950   [START ON 08/28/2023] azithromycin     [START ON 08/28/2023] cefTRIAXone (ROCEPHIN)  IV     PRN Meds:.acetaminophen **OR** acetaminophen, bisacodyl, hydrALAZINE, HYDROmorphone (DILAUDID) injection, ipratropium, levalbuterol, ondansetron **OR** ondansetron (ZOFRAN) IV, oxyCODONE, senna-docusate, sodium chloride flush, sodium phosphate, traZODone Medications Prior to Admission:  Prior to Admission medications   Medication Sig Start Date End Date Taking? Authorizing Provider  acetaminophen (TYLENOL) 325 MG tablet Take 2 tablets (650 mg total) by mouth every 6 (six) hours as needed for mild pain (or Fever >/= 101). 12/20/21  Yes Emokpae, Courage, MD  amLODipine (NORVASC) 5 MG tablet Take 1 tablet (5 mg total) by mouth daily. 04/18/23  Yes Dettinger, Elige Radon, MD  [facility]S VITAMIN C 500 MG tablet TAKE 1 TABLET (500 MG TOTAL) BY MOUTH DAILY. 07/30/23  Yes Dettinger, Elige Radon, MD  Fe Fum-FePoly-Vit C-Vit B3 (INTEGRA) 62.5-62.5-40-3 MG CAPS Take 1 capsule by mouth daily. 07/29/23  Yes [provider]  GEMTESA 75 MG TABS Take 1 tablet by mouth daily. 07/14/22  Yes [provider]  metoCLOPramide (REGLAN) 10 MG tablet TAKE 1 TABLET BY MOUTH EVERYDAY AT BEDTIME 05/21/23  Yes [name], MD  Multiple Vitamins-Minerals (CENTRUM SILVER PO) Take 1 tablet by mouth daily.   Yes [provider]  Omega-3 Fatty Acids (FISH OIL) 1000 MG CAPS Take 1 capsule by mouth 3 (three) times daily.    Yes [provider]  omeprazole (PRILOSEC) 20 MG capsule PLEASE MAKE A FOLLOW UP APPOINTMENT. (TAKE1-2 CAPSULES BY MOUTH DAILY(20-40MG ) 04/16/23  Yes Quentin Mulling R, PA-C  rosuvastatin (CRESTOR) 5 MG tablet TAKE 1 TABLET BY MOUTH ON MONDAY, WEDNESDAY, AND FRIDAY 04/18/23  Yes Dettinger, Elige Radon, MD  zinc sulfate 220 (50 Zn) MG capsule TAKE 1 CAPSULE BY MOUTH EVERY DAY 03/07/23  Yes Dettinger, Elige Radon, MD  Cholecalciferol (VITAMIN D) 2000 UNITS CAPS Take by mouth as directed. Take 1 tablet  daily except none on saturdays  and sundays Patient not taking: Reported on 08/27/2023    [provider]   Allergies  Allergen Reactions   Prevnar 13 [Pneumococcal 13-Val Conj Vacc] Hives    Red/streaking arm   Penicillins Hives   Tetanus Toxoids Other (See Comments)    Area of injection was red and streaked.   Codeine Nausea Only   Sulfonamide Derivatives Nausea Only   Review of Systems  Unable to perform ROS: Age    Physical Exam Vitals and nursing note reviewed.     Vital Signs: BP (!) 151/79 (BP Location: Right Arm)   Pulse 79   Temp 98.7 F (37.1 C) (Oral)   Resp 18   Ht 5' (1.524 m)   Wt 41.5 kg   SpO2 97%   BMI 17.89 kg/m  Pain Scale: 0-10   Pain Score: 0-No pain   SpO2: SpO2: 97 % O2 Device:SpO2: 97 % O2 Flow Rate: .   IO: Intake/output summary:  Intake/Output Summary (Last 24 hours) at 08/27/2023 1246 Last data filed at 08/27/2023 1208 Gross per 24 hour  Intake 740 ml  Output 2550 ml  Net -1810 ml    LBM:   Baseline Weight: Weight: 42.3 kg Most recent weight: Weight: 41.5 kg     Palliative Assessment/Data:     Time In: 1040     Time Out: 1135 Time Total: 55 minutes  Greater than 50%  of this time was spent counseling and coordinating care related to the above assessment and plan.  Signed by: Katheran Awe, NP   Please contact Palliative Medicine Team phone at [phone] for questions and concerns.  For individual provider: See Loretha Stapler

## 2023-08-27 NOTE — Evaluation (Signed)
Physical Therapy Evaluation Patient Details Name: Kristi Sawyer MRN: [id number] DOB: 14-Dec-1925 Today's Date: 08/27/2023  History of Present Illness  Kristi Sawyer is a 89 yo Female HTN, HLD, anxiety, GERD, osteoporosis presented with recurrent falls.  Last fall earlier on the day of admission..  She described a fall to the ED staff as a mechanical fall.  At baseline ambulates with assist of walker, apparently she fell forward and landed on top of her walker.  Reports of no passing out spells or dizziness.  She pressed her life alert where EMS responded and brought her to the ED.     Arrival she was not complaining of any area with pain.  Denied hitting her head or passing out.   Recent hospitalization for chronic generalized weaknesses, lives alone at home.   Clinical Impression  Patient demonstrates good return for getting into/out of bed and ambulating in room/hallway without loss of balance.  Patient requires increased time with labored effort for transferring to/from commode in bathroom and tolerated staying up in chair after therapy - nurse aware.  Patient will benefit from continued skilled physical therapy in hospital and recommended venue below to increase strength, balance, endurance for safe ADLs and gait.       If plan is discharge home, recommend the following: A little help with walking and/or transfers;A little help with bathing/dressing/bathroom;Help with stairs or ramp for entrance;Assistance with cooking/housework   Can travel by private vehicle        Equipment Recommendations None recommended by PT  Recommendations for Other Services       Functional Status Assessment Patient has had a recent decline in their functional status and demonstrates the ability to make significant improvements in function in a reasonable and predictable amount of time.     Precautions / Restrictions Precautions Precautions: Fall Restrictions Weight Bearing Restrictions: No       Mobility  Bed Mobility Overal bed mobility: Modified Independent                  Transfers Overall transfer level: Needs assistance Equipment used: Rolling walker (2 wheels) Transfers: Sit to/from Stand, Bed to chair/wheelchair/BSC Sit to Stand: Supervision   Step pivot transfers: Supervision       General transfer comment: increasd time, labored movement with mild difficulty transferring to/from commode in bathroom    Ambulation/Gait Ambulation/Gait assistance: Supervision Gait Distance (Feet): 50 Feet Assistive device: Rolling walker (2 wheels) Gait Pattern/deviations: Decreased step length - right, Decreased step length - left, Decreased stride length Gait velocity: decreased     General Gait Details: slow slightly labored cadence without loss of balance, limited mostly due to fatigue  Stairs            Wheelchair Mobility     Tilt Bed    Modified Rankin (Stroke Patients Only)       Balance Overall balance assessment: Needs assistance Sitting-balance support: Feet supported, No upper extremity supported Sitting balance-Leahy Scale: Good Sitting balance - Comments: seated at EOB   Standing balance support: Reliant on assistive device for balance, During functional activity, Bilateral upper extremity supported Standing balance-Leahy Scale: Fair Standing balance comment: fair/good using RW                             Pertinent Vitals/Pain Pain Assessment Pain Assessment: No/denies pain    Home Living Family/patient expects to be discharged to:: Private residence Living Arrangements: Alone Available Help  at Discharge: Friend(s);Available PRN/intermittently Type of Home: House Home Access: Stairs to enter Entrance Stairs-Rails: Right;Left;Can reach both Entrance Stairs-Number of Steps: 2-3   Home Layout: One level Home Equipment: BSC/3in1;Cane - single point;Rolling Walker (2 wheels);Shower seat;Grab bars - tub/shower       Prior Function Prior Level of Function : Independent/Modified Independent             Mobility Comments: Household and short distanced community ambulator using RW, uses electric scooter in stores ADLs Comments: Independent house hold ADL's, assisted for community ADLs by neighbors, church members     Extremity/Trunk Assessment   Upper Extremity Assessment Upper Extremity Assessment: Defer to OT evaluation    Lower Extremity Assessment Lower Extremity Assessment: Generalized weakness    Cervical / Trunk Assessment Cervical / Trunk Assessment: Normal;Lordotic  Communication   Communication Communication: No apparent difficulties  Cognition Arousal: Alert Behavior During Therapy: WFL for tasks assessed/performed Overall Cognitive Status: Within Functional Limits for tasks assessed                                          General Comments      Exercises     Assessment/Plan    PT Assessment Patient needs continued PT services  PT Problem List Decreased strength;Decreased activity tolerance;Decreased balance;Decreased mobility       PT Treatment Interventions DME instruction;Gait training;Stair training;Functional mobility training;Therapeutic activities;Therapeutic exercise;Balance training;Patient/family education    PT Goals (Current goals can be found in the Care Plan section)  Acute Rehab PT Goals Patient Stated Goal: return home with church members, neighbors to assist PT Goal Formulation: With patient Time For Goal Achievement: 09/01/23 Potential to Achieve Goals: Good    Frequency Min 3X/week     Co-evaluation               AM-PAC PT "6 Clicks" Mobility  Outcome Measure Help needed turning from your back to your side while in a flat bed without using bedrails?: None Help needed moving from lying on your back to sitting on the side of a flat bed without using bedrails?: None Help needed moving to and from a bed to a chair  (including a wheelchair)?: A Little Help needed standing up from a chair using your arms (e.g., wheelchair or bedside chair)?: A Little Help needed to walk in hospital room?: A Little Help needed climbing 3-5 steps with a railing? : A Little 6 Click Score: 20    End of Session   Activity Tolerance: Patient tolerated treatment well;Patient limited by fatigue Patient left: in chair;with call bell/phone within reach Nurse Communication: Mobility status PT Visit Diagnosis: Unsteadiness on feet (R26.81);Other abnormalities of gait and mobility (R26.89);Muscle weakness (generalized) (M62.81)    Time: 8119-1478 PT Time Calculation (min) (ACUTE ONLY): 30 min   Charges:   PT Evaluation $PT Eval Moderate Complexity: 1 Mod PT Treatments $Therapeutic Activity: 23-37 mins PT General Charges $$ ACUTE PT VISIT: 1 Visit         3:33 PM, 08/27/23 Ocie Bob, MPT Physical Therapist with [facility] [phone] office [phone] mobile phone

## 2023-08-27 NOTE — Assessment & Plan Note (Signed)
-   Last sodium level 08/09/2023 137 today 126 - Due to poor nutrition, dehydration -Continue gentle IV fluid hydration -Encouraging p.o. intake -Will monitor closely

## 2023-08-27 NOTE — Progress Notes (Signed)
Pt arrived to floor via stretcher from ED. Pt A&O, denies c/o pain or SOB. Pt able to transfer from stretcher to chair with one assist due to unsteady on feet and pt's tendency to lean backwards when standing. Pt states, "I just don't feel straight, I feel a little lightheaded." Pt oriented to room and safety procedures, states understanding. Chair alarm on for safety, call bell within reach. Advised pt to call for needs, pt states understanding.

## 2023-08-27 NOTE — Assessment & Plan Note (Signed)
Continue PPI ?

## 2023-08-27 NOTE — Assessment & Plan Note (Signed)
-   Acute on chronic, associated with falls, -Fall precautions -Consulting PT T for evaluation recommendations--likely will need placement, rehab

## 2023-08-27 NOTE — Assessment & Plan Note (Signed)
-   Chronic ataxia gait, with severe generalized weaknesses uses walker at baseline -Consulting PT OT for evaluation

## 2023-08-27 NOTE — Assessment & Plan Note (Signed)
-   Monitoring as needed small dose Xanax

## 2023-08-27 NOTE — Assessment & Plan Note (Signed)
-  Patient meets SIRS Khtari on arrival, tachypneic, tachycardic, with leukocytosis with WBC of 18.2, lactic acid 1.3  -Source likely pneumonia -finding on CT -Started on IV antibiotics Rocephin/azithromycin -Will follow-up with blood cultures -Continue gentle IV fluid hydration

## 2023-08-27 NOTE — Plan of Care (Signed)
  Problem: Acute Rehab PT Goals(only PT should resolve) Goal: Pt Will Go Supine/Side To Sit Flowsheets (Taken 08/27/2023 1534) Pt will go Supine/Side to Sit:  Independently  with modified independence Goal: Patient Will Transfer Sit To/From Stand Flowsheets (Taken 08/27/2023 1534) Patient will transfer sit to/from stand:  Independently  with modified independence Goal: Pt Will Transfer Bed To Chair/Chair To Bed Flowsheets (Taken 08/27/2023 1534) Pt will Transfer Bed to Chair/Chair to Bed:  with modified independence  with supervision Goal: Pt Will Ambulate Flowsheets (Taken 08/27/2023 1534) Pt will Ambulate:  75 feet  with modified independence  with supervision  with rolling walker   3:34 PM, 08/27/23 Ocie Bob, MPT Physical Therapist with [facility] [phone] office [phone] mobile phone

## 2023-08-27 NOTE — H&P (Signed)
History and Physical   Patient: Kristi Sawyer                            PCP: Dettinger, Elige Radon, MD                    DOB: 1926-01-31            DOA: 08/26/2023 RUE:[id number]             DOS: 08/27/2023, 7:27 AM  Dettinger, Elige Radon, MD  Patient coming from:   HOME  I have personally reviewed patient's medical records, in electronic medical records, including:  [facility] link, and care everywhere.    Chief Complaint:   Chief Complaint  Patient presents with   Fall    History of present illness:    Kristi Sawyer is a 89 yo Female HTN, HLD, anxiety, GERD, osteoporosis presented with recurrent falls.  Last fall earlier on the day of admission..  She described a fall to the ED staff as a mechanical fall.  At baseline ambulates with assist of walker, apparently she fell forward and landed on top of her walker.  Reports of no passing out spells or dizziness.  She pressed her life alert where EMS responded and brought her to the ED.  Arrival she was not complaining of any area with pain.  Denied hitting her head or passing out.  Recent hospitalization for chronic generalized weaknesses, lives alone at home.   ED course/evaluation: Blood pressure (!) 148/75, pulse 84, temperature 97.8 F (36.6 C), temperature source Oral, resp. rate 15, height 5' (1.524 m), weight 42.3 kg, SpO2 95%.  WBC 18.2, neutrophils 15.6, sodium 126, chloride 92, blood glucose level 132, AST 64, ALT 55,..  Troponin as high as 85, -EKG: Normal sinus rhythm, rate 88, QTc 437, -CT angio chest: No evidence of PE, no acute fractures, groundglass attenuation on the right upper lobe tree-in-bud appearance possible infectious, moderate amount of retained stool in colon-diffuse coronary calcification - CT abdomen pelvis_as above -Chest x-ray-clear Patient was given IV antibiotics Rocephin/azithromycin, IV fluids,      Patient Denies having: Fever, Chills, Cough, SOB, Chest Pain, Abd pain, N/V/D, headache,  dizziness, lightheadedness,  Dysuria, Joint pain, rash, open wounds    Review of Systems: As per HPI, otherwise 10 point review of systems were negative.   ----------------------------------------------------------------------------------------------------------------------  Allergies  Allergen Reactions   Prevnar 13 [Pneumococcal 13-Val Conj Vacc] Hives    Red/streaking arm   Penicillins Hives   Tetanus Toxoids Other (See Comments)    Area of injection was red and streaked.   Codeine Nausea Only   Sulfonamide Derivatives Nausea Only    Home MEDs:  Prior to Admission medications   Medication Sig Start Date End Date Taking? Authorizing Provider  acetaminophen (TYLENOL) 325 MG tablet Take 2 tablets (650 mg total) by mouth every 6 (six) hours as needed for mild pain (or Fever >/= 101). 12/20/21   Shon Hale, MD  amLODipine (NORVASC) 5 MG tablet Take 1 tablet (5 mg total) by mouth daily. 04/18/23   Dettinger, Elige Radon, MD  Calcium Carbonate-Vit D-Min (CALTRATE PLUS PO) Take 1 tablet by mouth daily.    [provider]  Cholecalciferol (VITAMIN D) 2000 UNITS CAPS Take by mouth as directed. Take 1 tablet daily except none on saturdays and sundays    [provider]  [employer] VITAMIN C 500 MG tablet TAKE 1 TABLET (  500 MG TOTAL) BY MOUTH DAILY. 07/30/23   Dettinger, Elige Radon, MD  GEMTESA 75 MG TABS Take 1 tablet by mouth daily. 07/14/22   [provider]  lidocaine (LIDODERM) 5 % Place 1 patch onto the skin daily. Remove & Discard patch within 12 hours or as directed by MD 07/29/23   Miguel Rota, MD  metoCLOPramide (REGLAN) 10 MG tablet TAKE 1 TABLET BY MOUTH EVERYDAY AT BEDTIME 05/21/23   [name], MD  Multiple Vitamins-Minerals (CENTRUM SILVER PO) Take 1 tablet by mouth daily.    [provider]  Omega-3 Fatty Acids (FISH OIL) 1000 MG CAPS Take 1 capsule by mouth 3 (three) times daily.     [provider]  omeprazole (PRILOSEC) 20 MG capsule  PLEASE MAKE A FOLLOW UP APPOINTMENT. (TAKE1-2 CAPSULES BY MOUTH DAILY(20-40MG ) 04/16/23   [name], PA-C  polyethylene glycol (MIRALAX / GLYCOLAX) 17 g packet Take 17 g by mouth 2 (two) times daily as needed for severe constipation. 07/29/23   Amin, Ankit C, MD  rosuvastatin (CRESTOR) 5 MG tablet TAKE 1 TABLET BY MOUTH ON MONDAY, [facility], AND FRIDAY 04/18/23   Dettinger, Elige Radon, MD  zinc sulfate 220 (50 Zn) MG capsule TAKE 1 CAPSULE BY MOUTH EVERY DAY 03/07/23   Dettinger, Elige Radon, MD    PRN MEDs: acetaminophen **OR** acetaminophen, bisacodyl, hydrALAZINE, HYDROmorphone (DILAUDID) injection, ipratropium, levalbuterol, ondansetron **OR** ondansetron (ZOFRAN) IV, oxyCODONE, senna-docusate, sodium chloride flush, sodium phosphate, traZODone  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 benign tumor      reports that she has never smoked. She has never used smokeless tobacco. She reports that she does not drink alcohol and does not use drugs.   Family History  Problem Relation Age of Onset   Healthy Mother    Lung cancer Father    Colon cancer Neg Hx    Stomach cancer Neg Hx    Esophageal cancer Neg Hx     Physical Exam:   Vitals:    08/27/23 0500 08/27/23 0530 08/27/23 0600 08/27/23 0630  BP: (!) 146/71 (!) 137/94 (!) 150/79 (!) 148/75  Pulse: 67 80 70 84  Resp: 17  15   Temp:   97.8 F (36.6 C)   TempSrc:   Oral   SpO2: 95% 98% 96% 95%  Weight:      Height:       Constitutional: NAD, calm, comfortable Eyes: PERRL, lids and conjunctivae normal  ENMT: Mucous membranes are moist. Posterior pharynx clear of any exudate or lesions.Normal dentition.  Neck: normal, supple, no masses, no thyromegaly Respiratory: clear to auscultation bilaterally, no wheezing, no crackles. Normal respiratory effort. No accessory muscle use.  Cardiovascular: Regular rate and rhythm, no murmurs / rubs / gallops. No extremity edema. 2+ pedal pulses. No carotid bruits.  Abdomen: no tenderness, no masses palpated. No hepatosplenomegaly. Bowel sounds positive.  Musculoskeletal: no clubbing / cyanosis. No joint deformity upper and lower extremities. Good ROM, no contractures. Normal muscle tone.  Neurologic: CN II-XII grossly intact. Sensation intact, DTR normal. Strength 5/5 in all 4.  Psychiatric: Normal judgment and insight. Alert and oriented x 3. Normal mood.  Skin: no rashes, lesions, ulcers. No induration Decubitus/ulcers:  Wounds: per nursing documentation         Labs on admission:    I have personally reviewed following labs and imaging studies  CBC: Recent Labs  Lab 08/26/23 2352  WBC 18.2*  NEUTROABS 15.6*  HGB 13.4  HCT 39.7  MCV 92.1  PLT 250   Basic Metabolic Panel: Recent Labs  Lab 08/26/23 2352  NA 126*  K 3.5  CL 92*  CO2 24  GLUCOSE 132*  BUN 12  CREATININE 0.53  CALCIUM 9.0  MG 1.9   GFR: Estimated Creatinine Clearance: 28.7 mL/min (by C-G formula based on SCr of 0.53 mg/dL). Liver Function Tests: Recent Labs  Lab 08/26/23 2352  AST 64*  ALT 55*  ALKPHOS 48  BILITOT 0.5  PROT 6.0*  ALBUMIN 3.8   No results for input(s): "LIPASE", "AMYLASE" in the last 168 hours. No results for  input(s): "AMMONIA" in the last 168 hours. Coagulation Profile: No results for input(s): "INR", "PROTIME" in the last 168 hours. Cardiac Enzymes: No results for input(s): "CKTOTAL", "CKMB", "CKMBINDEX", "TROPONINI" in the last 168 hours. BNP (last 3 results) No results for input(s): "PROBNP" in the last 8760 hours. HbA1C: No results for input(s): "HGBA1C" in the last 72 hours. CBG: No results for input(s): "GLUCAP" in the last 168 hours. Lipid Profile: No results for input(s): "CHOL", "HDL", "LDLCALC", "TRIG", "CHOLHDL", "LDLDIRECT" in the last 72 hours. Thyroid Function Tests: No results for input(s): "TSH", "T4TOTAL", "FREET4", "T3FREE", "THYROIDAB" in the last 72 hours. Anemia Panel: No results for input(s): "VITAMINB12", "FOLATE", "FERRITIN", "TIBC", "IRON", "RETICCTPCT" in the last 72 hours. Urine analysis:    Component Value Date/Time   COLORURINE YELLOW 08/26/2023 2340   APPEARANCEUR CLEAR 08/26/2023 2340   LABSPEC 1.006 08/26/2023 2340   PHURINE 6.0 08/26/2023 2340   GLUCOSEU NEGATIVE 08/26/2023 2340   HGBUR MODERATE (A) 08/26/2023 2340   BILIRUBINUR NEGATIVE 08/26/2023 2340   BILIRUBINUR neg 07/07/2014 1033   KETONESUR NEGATIVE 08/26/2023 2340   PROTEINUR 30 (A) 08/26/2023 2340   UROBILINOGEN negative 07/07/2014 1033   NITRITE NEGATIVE 08/26/2023 2340   LEUKOCYTESUR TRACE (A) 08/26/2023 2340    Last A1C:  No results found for: "HGBA1C"   Radiologic Exams on Admission:   CT Angio Chest PE W and/or Wo Contrast  Result Date: 08/27/2023 CLINICAL DATA:  Pulmonary embolism suspected, high probability. Fall and weakness. Abdominal trauma, blunt. EXAM: CT ANGIOGRAPHY CHEST CT ABDOMEN AND PELVIS WITH CONTRAST TECHNIQUE: Multidetector CT imaging of the chest was performed using the standard protocol during bolus administration of intravenous contrast. Multiplanar CT image reconstructions and MIPs were obtained to evaluate the vascular anatomy. Multidetector CT imaging of  the abdomen and pelvis was performed using the standard protocol during bolus administration of intravenous contrast.  RADIATION DOSE REDUCTION: This exam was performed according to the departmental dose-optimization program which includes automated exposure control, adjustment of the mA and/or kV according to patient size and/or use of iterative reconstruction technique. CONTRAST:  75mL OMNIPAQUE IOHEXOL 350 MG/ML SOLN COMPARISON:  None Available. FINDINGS: CTA CHEST FINDINGS Cardiovascular: The heart is mildly enlarged and there is no pericardial effusion. Few scattered coronary artery calcifications are present. There is atherosclerotic calcification of the aorta without evidence of aneurysm. The pulmonary trunk is normal in caliber. No evidence of pulmonary embolism is seen. Mediastinum/Nodes: No mediastinal, hilar, or axillary lymphadenopathy. There is a 4 mm hypodensity in the right lobe of the thyroid gland. No additional imaging is recommended. The trachea is within normal limits. There is mild thickening of the walls of the mid esophagus. Lungs/Pleura: Atelectasis is present bilaterally. No effusion or pneumothorax. Mild ground-glass attenuation is present at the right hilum. There are few scattered pulmonary nodules in the right upper lobe, some with tree-in-bud configuration. Musculoskeletal: Multiple old healed rib fractures are noted on the right. No acute fracture is seen. Review of the MIP images confirms the above findings. CT ABDOMEN and PELVIS FINDINGS Hepatobiliary: No focal liver abnormality is seen. No gallstones, gallbladder wall thickening, or biliary dilatation. Pancreas: Unremarkable. No pancreatic ductal dilatation or surrounding inflammatory changes. Spleen: Normal in size without focal abnormality. Adrenals/Urinary Tract: The adrenal glands are within normal limits. The kidneys enhance symmetrically. A subcentimeter hypodensity is noted in the mid right kidney which is too small to further  characterize. No renal calculus bilaterally. Extrarenal pelvises are present bilaterally. There is no obstructive uropathy. A bladder diverticulum is present on the right. Stomach/Bowel: Stomach is within normal limits. Appendix is not seen. No evidence of bowel wall thickening, distention, or inflammatory changes. No free air or pneumatosis. A moderate amount of retained stool is present in the colon. Vascular/Lymphatic: Aortic atherosclerosis. No enlarged abdominal or pelvic lymph nodes. Reproductive: Status post hysterectomy. No adnexal masses. Other: No abdominopelvic ascites. Musculoskeletal: Degenerative changes are present in the lumbar spine. Review of the MIP images confirms the above findings. IMPRESSION: 1. No evidence of pulmonary embolism. 2. No acute fracture or solid organ injury. 3. Ground-glass attenuation with a few scattered nodular opacities in the right upper lobe, some with a tree-in-bud configuration, which may be infectious or inflammatory. 4. Moderate amount of retained stool in the colon. 5. Aortic atherosclerosis and coronary artery calcifications. Electronically Signed   By: Thornell Sartorius M.D.   On: 08/27/2023 03:20   CT ABDOMEN PELVIS W CONTRAST  Result Date: 08/27/2023 CLINICAL DATA:  Pulmonary embolism suspected, high probability. Fall and weakness. Abdominal trauma, blunt. EXAM: CT ANGIOGRAPHY CHEST CT ABDOMEN AND PELVIS WITH CONTRAST TECHNIQUE: Multidetector CT imaging of the chest was performed using the standard protocol during bolus administration of intravenous contrast. Multiplanar CT image reconstructions and MIPs were obtained to evaluate the vascular anatomy. Multidetector CT imaging of the abdomen and pelvis was performed using the standard protocol during bolus administration of intravenous contrast. RADIATION DOSE REDUCTION: This exam was performed according to the departmental dose-optimization program which includes automated exposure control, adjustment of the mA  and/or kV according to patient size and/or use of iterative reconstruction technique. CONTRAST:  75mL OMNIPAQUE IOHEXOL 350 MG/ML SOLN COMPARISON:  None Available. FINDINGS: CTA CHEST FINDINGS Cardiovascular: The heart is mildly enlarged and there is no pericardial effusion. Few scattered coronary artery calcifications are present. There is atherosclerotic calcification of the aorta without evidence of aneurysm. The pulmonary  trunk is normal in caliber. No evidence of pulmonary embolism is seen. Mediastinum/Nodes: No mediastinal, hilar, or axillary lymphadenopathy. There is a 4 mm hypodensity in the right lobe of the thyroid gland. No additional imaging is recommended. The trachea is within normal limits. There is mild thickening of the walls of the mid esophagus. Lungs/Pleura: Atelectasis is present bilaterally. No effusion or pneumothorax. Mild ground-glass attenuation is present at the right hilum. There are few scattered pulmonary nodules in the right upper lobe, some with tree-in-bud configuration. Musculoskeletal: Multiple old healed rib fractures are noted on the right. No acute fracture is seen. Review of the MIP images confirms the above findings. CT ABDOMEN and PELVIS FINDINGS Hepatobiliary: No focal liver abnormality is seen. No gallstones, gallbladder wall thickening, or biliary dilatation. Pancreas: Unremarkable. No pancreatic ductal dilatation or surrounding inflammatory changes. Spleen: Normal in size without focal abnormality. Adrenals/Urinary Tract: The adrenal glands are within normal limits. The kidneys enhance symmetrically. A subcentimeter hypodensity is noted in the mid right kidney which is too small to further characterize. No renal calculus bilaterally. Extrarenal pelvises are present bilaterally. There is no obstructive uropathy. A bladder diverticulum is present on the right. Stomach/Bowel: Stomach is within normal limits. Appendix is not seen. No evidence of bowel wall thickening,  distention, or inflammatory changes. No free air or pneumatosis. A moderate amount of retained stool is present in the colon. Vascular/Lymphatic: Aortic atherosclerosis. No enlarged abdominal or pelvic lymph nodes. Reproductive: Status post hysterectomy. No adnexal masses. Other: No abdominopelvic ascites. Musculoskeletal: Degenerative changes are present in the lumbar spine. Review of the MIP images confirms the above findings. IMPRESSION: 1. No evidence of pulmonary embolism. 2. No acute fracture or solid organ injury. 3. Ground-glass attenuation with a few scattered nodular opacities in the right upper lobe, some with a tree-in-bud configuration, which may be infectious or inflammatory. 4. Moderate amount of retained stool in the colon. 5. Aortic atherosclerosis and coronary artery calcifications. Electronically Signed   By: Thornell Sartorius M.D.   On: 08/27/2023 03:20   DG Pelvis Portable  Result Date: 08/26/2023 CLINICAL DATA:  Fall EXAM: PORTABLE PELVIS 1-2 VIEWS COMPARISON:  None Available. FINDINGS: There is no evidence of pelvic fracture or diastasis. No pelvic bone lesions are seen. Degenerative changes in the visualized lower lumbar spine. IMPRESSION: No acute bony abnormality. Electronically Signed   By: Charlett Nose M.D.   On: 08/26/2023 23:51   DG Chest Port 1 View  Result Date: 08/26/2023 CLINICAL DATA:  Questionable sepsis - evaluate for abnormality EXAM: PORTABLE CHEST 1 VIEW COMPARISON:  07/26/2023 FINDINGS: Heart and mediastinal contours are within normal limits. No focal opacities or effusions. No acute bony abnormality. Aortic atherosclerosis. Mild hyperinflation. IMPRESSION: No active disease. Electronically Signed   By: Charlett Nose M.D.   On: 08/26/2023 23:51    EKG:   Independently reviewed.  Orders placed or performed during the hospital encounter of 08/26/23   ED EKG 12-Lead   ED EKG 12-Lead   EKG 12-Lead   EKG 12-Lead   EKG 12-Lead    ---------------------------------------------------------------------------------------------------------------------------------------    Assessment / Plan:   Principal Problem:   SIRS (systemic inflammatory response syndrome) (HCC) Active Problems:   Debility   CAP (community acquired pneumonia)   Hyponatremia   Hyperlipidemia   ANXIETY   Ataxic gait   GERD (gastroesophageal reflux disease)   Hypertension   Fall at home, initial encounter   Assessment and Plan: * SIRS (systemic inflammatory response syndrome) (HCC) -Patient meets SIRS Randye Lobo  on arrival, tachypneic, tachycardic, with leukocytosis with WBC of 18.2, lactic acid 1.3  -Source likely pneumonia -finding on CT -Started on IV antibiotics Rocephin/azithromycin -Will follow-up with blood cultures -Continue gentle IV fluid hydration  Hyponatremia - Last sodium level 08/09/2023 137 today 126 - Due to poor nutrition, dehydration -Continue gentle IV fluid hydration -Encouraging p.o. intake -Will monitor closely  CAP (community acquired pneumonia) - Finding based on CT scan, with leukocytosis -Initiated on IV antibiotics Rocephin/azithromycin will continue -Will follow-up with cultures, will try to obtain sputum culture  Debility - Acute on chronic, associated with falls, -Fall precautions -Consulting PT T for evaluation recommendations--likely will need placement, rehab  Ataxic gait - Chronic ataxia gait, with severe generalized weaknesses uses walker at baseline -Consulting PT OT for evaluation  ANXIETY - Monitoring as needed small dose Xanax  Hyperlipidemia - Withholding now, checking total CK due to falls -Anticipating discontinuing her medication as at this age risk outweighs the benefits  Fall at home, initial encounter - Accidental falls, with chronic debility, gait ataxia -Consulting PT OT for evaluation likely will need placement, rehab -Assist with all ADLs, -CT chest abdomen reviewed, no  obvious signs of physical trauma  Hypertension -Resuming home medication with caution -As needed hydralazine  GERD (gastroesophageal reflux disease) -Continue PPI      Consults called:  Jory Sims Polo Riley -------------------------------------------------------------------------------------------------------------------------------------------- DVT prophylaxis:  heparin injection 5,000 Units Start: 08/27/23 0715 TED hose Start: 08/27/23 0707 SCDs Start: 08/27/23 0707   Code Status:   Code Status: Full Code   Admission status: Patient will be admitted as Observation, with a greater than 2 midnight length of stay. Level of care: Telemetry   Family Communication:  none at bedside  (The above findings and plan of care has been discussed with patient in detail, the patient expressed understanding and agreement of above plan)  --------------------------------------------------------------------------------------------------------------------------------------------------  Disposition Plan:  Anticipated 1-2 days Status is: Observation The patient remains OBS appropriate and will d/c before 2 midnights.     ----------------------------------------------------------------------------------------------------------------------------------------------------  Time spent:  44  Min.  Was spent seeing and evaluating the patient, reviewing all medical records, drawn plan of care.  SIGNED: Kendell Bane, MD, FHM. FAAFP. [place] [facility], Pager  (Please use [email or web address] to page/ or secure chat through epic) If 7PM-7AM, please contact night-coverage [email or web address],  08/27/2023, 7:27 AM

## 2023-08-27 NOTE — Assessment & Plan Note (Signed)
-   Finding based on CT scan, with leukocytosis -Initiated on IV antibiotics Rocephin/azithromycin will continue -Will follow-up with cultures, will try to obtain sputum culture

## 2023-08-27 NOTE — Hospital Course (Signed)
Kristi Sawyer is a 89 yo Female HTN, HLD, anxiety, GERD, osteoporosis presented with recurrent falls.  Last fall earlier on the day of admission..  She described a fall to the ED staff as a mechanical fall.  At baseline ambulates with assist of walker, apparently she fell forward and landed on top of her walker.  Reports of no passing out spells or dizziness.  She pressed her life alert where EMS responded and brought her to the ED.  Arrival she was not complaining of any area with pain.  Denied hitting her head or passing out.  Recent hospitalization for chronic generalized weaknesses, lives alone at home.   ED course/evaluation: Blood pressure (!) 148/75, pulse 84, temperature 97.8 F (36.6 C), temperature source Oral, resp. rate 15, height 5' (1.524 m), weight 42.3 kg, SpO2 95%.  WBC 18.2, neutrophils 15.6, sodium 126, chloride 92, blood glucose level 132, AST 64, ALT 55,..  Troponin as high as 85, -EKG: Normal sinus rhythm, rate 88, QTc 437, -CT angio chest: No evidence of PE, no acute fractures, groundglass attenuation on the right upper lobe tree-in-bud appearance possible infectious, moderate amount of retained stool in colon-diffuse coronary calcification - CT abdomen pelvis_as above -Chest x-ray-clear Patient was given IV antibiotics Rocephin/azithromycin, IV fluids,

## 2023-08-28 DIAGNOSIS — R6510 Systemic inflammatory response syndrome (SIRS) of non-infectious origin without acute organ dysfunction: Secondary | ICD-10-CM | POA: Diagnosis not present

## 2023-08-28 LAB — CBC
HCT: 37.1 % (ref 36.0–46.0)
Hemoglobin: 12.4 g/dL (ref 12.0–15.0)
MCH: 31.1 pg (ref 26.0–34.0)
MCHC: 33.4 g/dL (ref 30.0–36.0)
MCV: 93 fL (ref 80.0–100.0)
Platelets: 231 10*3/uL (ref 150–400)
RBC: 3.99 MIL/uL (ref 3.87–5.11)
RDW: 13.2 % (ref 11.5–15.5)
WBC: 10.2 10*3/uL (ref 4.0–10.5)
nRBC: 0 % (ref 0.0–0.2)

## 2023-08-28 LAB — BASIC METABOLIC PANEL
Anion gap: 8 (ref 5–15)
BUN: 7 mg/dL — ABNORMAL LOW (ref 8–23)
CO2: 27 mmol/L (ref 22–32)
Calcium: 8.6 mg/dL — ABNORMAL LOW (ref 8.9–10.3)
Chloride: 98 mmol/L (ref 98–111)
Creatinine, Ser: 0.41 mg/dL — ABNORMAL LOW (ref 0.44–1.00)
GFR, Estimated: >60 mL/min (ref >60)
Glucose, Bld: 110 mg/dL — ABNORMAL HIGH (ref 70–99)
Potassium: 2.7 mmol/L — CL (ref 3.5–5.1)
Sodium: 133 mmol/L — ABNORMAL LOW (ref 135–145)

## 2023-08-28 MED ORDER — POTASSIUM CHLORIDE CRYS ER 20 MEQ PO TBCR
40.0000 meq | EXTENDED_RELEASE_TABLET | Freq: Once | ORAL | Status: AC
  Administered 2023-08-28: 40 meq via ORAL
  Filled 2023-08-28: qty 2

## 2023-08-28 MED ORDER — MAGNESIUM SULFATE 2 GM/50ML IV SOLN
2.0000 g | Freq: Once | INTRAVENOUS | Status: AC
  Administered 2023-08-28: 2 g via INTRAVENOUS
  Filled 2023-08-28: qty 50

## 2023-08-28 NOTE — [place] FL2 (Signed)
[place] MEDICAID FL2 LEVEL OF CARE FORM     IDENTIFICATION  Patient Name: Kristi Sawyer Birthdate: 1926-09-12 Sex: female Admission Date (Current Location): 08/26/2023  [facility] and [place] Number:  [employer] and Address:  [facility],  [address] [place] [address]      Provider Number: [id number]  Attending Physician Name and Address:  Kendell Bane, MD  Relative Name and Phone Number:       Current Level of Care: Hospital Recommended Level of Care: Skilled Nursing Facility Prior Approval Number:    Date Approved/Denied:   PASRR Number: [id number]  Discharge Plan: SNF    Current Diagnoses: Patient Active Problem List   Diagnosis Date Noted   Fall at home, initial encounter 08/27/2023   CAP (community acquired pneumonia) 08/27/2023   SIRS (systemic inflammatory response syndrome) (HCC) 08/27/2023   Hyponatremia 08/27/2023   Generalized weakness 08/27/2023   Hypokalemia 07/27/2023   Debility 07/26/2023   Falls 12/19/2021   Ataxic gait    UTI (urinary tract infection)    Physical deconditioning    Osteoporosis without current pathological fracture 11/23/2020   Hypertension 10/28/2017   Overactive bladder 06/18/2015   Hyperlipidemia 09/26/2010   ANXIETY 09/26/2010   GERD (gastroesophageal reflux disease) 09/26/2010   CELIAC SPRUE 09/26/2010    Orientation RESPIRATION BLADDER Height & Weight     Self, Time, Situation, Place  Normal Continent Weight: 91 lb 0.8 oz (41.3 kg) Height:  5' (152.4 cm)  BEHAVIORAL SYMPTOMS/MOOD NEUROLOGICAL BOWEL NUTRITION STATUS      Continent Diet (see dc summary)  AMBULATORY STATUS COMMUNICATION OF NEEDS Skin   Extensive Assist Verbally Normal                       Personal Care Assistance Level of Assistance  Bathing, Feeding, Dressing Bathing Assistance: Limited assistance Feeding assistance: Independent Dressing Assistance: Limited assistance     Functional Limitations Info   Sight, Hearing, Speech Sight Info: Adequate Hearing Info: Adequate Speech Info: Adequate    SPECIAL CARE FACTORS FREQUENCY  PT (By licensed PT), OT (By licensed OT)     PT Frequency: 5x week OT Frequency: 5x week            Contractures Contractures Info: Not present    Additional Factors Info  Code Status, Allergies Code Status Info: Full Allergies Info: Prevnar 13 (Pneumococcal 13-val Conj Vacc), Penicillins, Tetanus Toxoids, Codeine, Sulfonamide Derivatives           Current Medications (08/28/2023):  This is the current hospital active medication list Current Facility-Administered Medications  Medication Dose Route Frequency Provider Last Rate Last Admin   acetaminophen (TYLENOL) tablet 650 mg  650 mg Oral Q6H PRN Shahmehdi, Seyed A, MD       Or   acetaminophen (TYLENOL) suppository 650 mg  650 mg Rectal Q6H PRN Shahmehdi, Seyed A, MD       amLODipine (NORVASC) tablet 5 mg  5 mg Oral Daily Shahmehdi, Seyed A, MD   5 mg at 08/28/23 5621   ascorbic acid (VITAMIN C) tablet 500 mg  500 mg Oral Daily Shahmehdi, Seyed A, MD   500 mg at 08/28/23 3086   aspirin EC tablet 81 mg  81 mg Oral Daily Shahmehdi, Seyed A, MD   81 mg at 08/28/23 5784   azithromycin (ZITHROMAX) 500 mg in sodium chloride 0.9 % 250 mL IVPB  500 mg Intravenous Q24H Shahmehdi, Gemma Payor, MD   Stopped at  08/28/23 0555   bisacodyl (DULCOLAX) EC tablet 5 mg  5 mg Oral Daily PRN Shahmehdi, Seyed A, MD       cefTRIAXone (ROCEPHIN) 1 g in sodium chloride 0.9 % 100 mL IVPB  1 g Intravenous Q24H Shahmehdi, Seyed A, MD 200 mL/hr at 08/28/23 0350 1 g at 08/28/23 0350   cholecalciferol (VITAMIN D3) 25 MCG (1000 UNIT) tablet 2,000 Units  2,000 Units Oral Once per day on Monday Tuesday Wednesday Thursday Friday Kendell Bane, MD   2,000 Units at 08/28/23 8295   heparin injection 5,000 Units  5,000 Units Subcutaneous Q8H Shahmehdi, Guadalupe Maple A, MD   5,000 Units at 08/28/23 0453   hydrALAZINE (APRESOLINE) injection 10 mg   10 mg Intravenous Q4H PRN Shahmehdi, Seyed A, MD       HYDROmorphone (DILAUDID) injection 0.5-1 mg  0.5-1 mg Intravenous Q2H PRN Shahmehdi, Seyed A, MD       ipratropium (ATROVENT) nebulizer solution 0.5 mg  0.5 mg Nebulization Q6H PRN Shahmehdi, Seyed A, MD       levalbuterol (XOPENEX) nebulizer solution 0.63 mg  0.63 mg Nebulization Q6H PRN Shahmehdi, Seyed A, MD       lidocaine (LIDODERM) 5 % 1 patch  1 patch Transdermal Q24H Shahmehdi, Seyed A, MD       metoCLOPramide (REGLAN) tablet 5 mg  5 mg Oral QHS Shahmehdi, Seyed A, MD   5 mg at 08/27/23 2137   multivitamin with minerals tablet 1 tablet  1 tablet Oral Daily Zierle-Ghosh, Asia B, DO   1 tablet at 08/28/23 0922   ondansetron (ZOFRAN) tablet 4 mg  4 mg Oral Q6H PRN Shahmehdi, Seyed A, MD       Or   ondansetron (ZOFRAN) injection 4 mg  4 mg Intravenous Q6H PRN Shahmehdi, Seyed A, MD       oxyCODONE (Oxy IR/ROXICODONE) immediate release tablet 5 mg  5 mg Oral Q4H PRN Shahmehdi, Seyed A, MD       pantoprazole (PROTONIX) EC tablet 40 mg  40 mg Oral Daily Shahmehdi, Seyed A, MD   40 mg at 08/28/23 6213   senna-docusate (Senokot-S) tablet 1 tablet  1 tablet Oral QHS PRN Shahmehdi, Seyed A, MD       sodium chloride flush (NS) 0.9 % injection 3 mL  3 mL Intravenous Q12H Shahmehdi, Seyed A, MD   3 mL at 08/28/23 0925   sodium chloride flush (NS) 0.9 % injection 3 mL  3 mL Intravenous PRN Shahmehdi, Seyed A, MD       sodium phosphate (FLEET) enema 1 enema  1 enema Rectal Once PRN Shahmehdi, Seyed A, MD       traZODone (DESYREL) tablet 25 mg  25 mg Oral QHS PRN Shahmehdi, Seyed A, MD       zinc sulfate capsule 220 mg  220 mg Oral Daily Shahmehdi, Seyed A, MD   220 mg at 08/28/23 0865     Discharge Medications: Please see discharge summary for a list of discharge medications.  Relevant Imaging Results:  Relevant Lab Results:   Additional Information SSN: [id number], [place]

## 2023-08-28 NOTE — Evaluation (Signed)
Occupational Therapy Evaluation Patient Details Name: Kristi Sawyer MRN: [id number] DOB: 01/04/1926 Today's Date: 08/28/2023   History of Present Illness Kristi Sawyer is a 89 yo Female HTN, HLD, anxiety, GERD, osteoporosis presented with recurrent falls.  Last fall earlier on the day of admission..  She described a fall to the ED staff as a mechanical fall.  At baseline ambulates with assist of walker, apparently she fell forward and landed on top of her walker.  Reports of no passing out spells or dizziness.  She pressed her life alert where EMS responded and brought her to the ED.     Arrival she was not complaining of any area with pain.  Denied hitting her head or passing out.   Recent hospitalization for chronic generalized weaknesses, lives alone at home.   Clinical Impression   Pt sitting up in chair on OT arrival, agreeable to evaluation. Pt reports independence in basic ADLs at baseline. Pt with generalized weakness, did display incontinence of urine on standing, ambulating to restroom. Pt reports she normally wears depends at home. Pt completing ADLs with min assist due to unfamiliar process of mesh underwear and a pad. Recommend continued OT services to improve safety and independence in ADLs and functional mobility tasks.        If plan is discharge home, recommend the following: A little help with bathing/dressing/bathroom;Assistance with cooking/housework;Assist for transportation    Functional Status Assessment  Patient has had a recent decline in their functional status and demonstrates the ability to make significant improvements in function in a reasonable and predictable amount of time.  Equipment Recommendations  None recommended by OT       Precautions / Restrictions Precautions Precautions: Fall Restrictions Weight Bearing Restrictions: No      Mobility Bed Mobility               General bed mobility comments: Pt seated in chair on OT arrival     Transfers Overall transfer level: Needs assistance Equipment used: Rolling walker (2 wheels) Transfers: Sit to/from Stand Sit to Stand: Contact guard assist                      ADL either performed or assessed with clinical judgement   ADL Overall ADL's : Needs assistance/impaired Eating/Feeding: Modified independent;Sitting   Grooming: Wash/dry hands;Set up;Sitting Grooming Details (indicate cue type and reason): sitting, using hand sanitizer after toileting             Lower Body Dressing: Minimal assistance;Sitting/lateral leans;Sit to/from stand Lower Body Dressing Details (indicate cue type and reason): Doffing and donning new socks in sitting, independently. Pt requiring min assist for doffing and donning clean mesh underwear. Slow movement, increased time for task Toilet Transfer: Contact guard assist;Ambulation;Rolling walker (2 wheels) Toilet Transfer Details (indicate cue type and reason): Pt incontinent of urine upon standing, ambulating to restroom with CGA Toileting- Clothing Manipulation and Hygiene: Minimal assistance;Sitting/lateral lean;Sit to/from stand Toileting - Clothing Manipulation Details (indicate cue type and reason): Pt requiring min assist for doffing soiled and donning clean mesh underwear. Increased time for task     Functional mobility during ADLs: Contact guard assist;Rolling walker (2 wheels)       Vision Baseline Vision/History: 1 Wears glasses Ability to See in Adequate Light: 1 Impaired Patient Visual Report: No change from baseline Vision Assessment?: No apparent visual deficits (wears glasses all the time)     Perception Perception: Within Functional Limits  Praxis Praxis: WFL       Pertinent Vitals/Pain Pain Assessment Pain Assessment: No/denies pain     Extremity/Trunk Assessment Upper Extremity Assessment Upper Extremity Assessment: Generalized weakness   Lower Extremity Assessment Lower Extremity  Assessment: Defer to PT evaluation   Cervical / Trunk Assessment Cervical / Trunk Assessment: Normal;Lordotic   Communication Communication Communication: No apparent difficulties   Cognition Arousal: Alert Behavior During Therapy: WFL for tasks assessed/performed Overall Cognitive Status: Within Functional Limits for tasks assessed                                                  Home Living Family/patient expects to be discharged to:: Private residence Living Arrangements: Alone Available Help at Discharge: Friend(s);Available PRN/intermittently Type of Home: House Home Access: Stairs to enter [employer] of Steps: 2-3 Entrance Stairs-Rails: Right;Left;Can reach both Home Layout: One level     Bathroom Shower/Tub: Chief Strategy Officer: Standard     Home Equipment: BSC/3in1;Cane - single point;Rolling Walker (2 wheels);Shower seat;Grab bars - tub/shower;Toilet riser   Additional Comments: Has life alert system      Prior Functioning/Environment Prior Level of Function : Independent/Modified Independent             Mobility Comments: Household and short distanced community ambulator using RW, uses electric scooter in stores ADLs Comments: Independent house hold ADL's, assisted for community ADLs by neighbors, church members, does not drive        OT Problem List: Decreased strength;Decreased activity tolerance;Impaired balance (sitting and/or standing);Decreased knowledge of use of DME or AE      OT Treatment/Interventions: Self-care/ADL training;Therapeutic exercise;DME and/or AE instruction;Therapeutic activities;Patient/family education    OT Goals(Current goals can be found in the care plan section) Acute Rehab OT Goals Patient Stated Goal: To return home OT Goal Formulation: With patient Time For Goal Achievement: 09/11/23 Potential to Achieve Goals: Good  OT Frequency: Min 1X/week       AM-PAC OT "6  Clicks" Daily Activity     Outcome Measure Help from another person eating meals?: None Help from another person taking care of personal grooming?: A Little Help from another person toileting, which includes using toliet, bedpan, or urinal?: A Little Help from another person bathing (including washing, rinsing, drying)?: A Little Help from another person to put on and taking off regular upper body clothing?: None Help from another person to put on and taking off regular lower body clothing?: None 6 Click Score: 21   End of Session Equipment Utilized During Treatment: Gait belt;Rolling walker (2 wheels)  Activity Tolerance: Patient tolerated treatment well Patient left: in chair;with call bell/phone within reach  OT Visit Diagnosis: Unsteadiness on feet (R26.81);Muscle weakness (generalized) (M62.81)                Time: 7829-5621 OT Time Calculation (min): 23 min Charges:  OT General Charges $OT Visit: 1 Visit OT Evaluation $OT Eval Low Complexity: 1 Low   Ezra Sites, OTR/L  [phone] 08/28/2023, 2:58 PM

## 2023-08-28 NOTE — TOC Progression Note (Signed)
Transition of Care [facility]) - Progression Note    Patient Details  Name: Kristi Sawyer MRN: [id number] Date of Birth: [birth year]  [facility] CM/SW Contact  Elliot Gault, LCSW Phone Number: 08/28/2023, 11:32 AM  Clinical Narrative:     TOC following. Pt now agreeable to SNF rehab at dc. CMS provider options and ratings reviewed. Will refer as requested and start insurance auth. MD anticipating dc tomorrow.  TOC will follow.  Expected Discharge Plan: Skilled Nursing Facility Barriers to Discharge: Continued Medical Work up  Expected Discharge Plan and Services In-house Referral: Clinical Social Work   Post Acute Care Choice: Skilled Nursing Facility Living arrangements for the past 2 months: Single Family Home                                       Social Determinants of Health (SDOH) Interventions SDOH Screenings   Food Insecurity: No Food Insecurity (08/27/2023)  Housing: Low Risk  (08/27/2023)  Transportation Needs: No Transportation Needs (08/27/2023)  Utilities: Not At Risk (08/27/2023)  Alcohol Screen: Low Risk  (12/15/2022)  Depression (PHQ2-9): Low Risk  (08/09/2023)  Financial Resource Strain: Low Risk  (12/15/2022)  Physical Activity: Insufficiently Active (12/15/2022)  Social Connections: Moderately Integrated (12/15/2022)  Stress: No Stress Concern Present (12/15/2022)  Tobacco Use: Low Risk  (08/27/2023)    Readmission Risk Interventions     No data to display

## 2023-08-28 NOTE — Progress Notes (Signed)
   08/28/23 0600  Provider Notification  Provider Name/Title Carren Rang, DO  Date Provider Notified 08/28/23  Time Provider Notified 0630  Method of Notification Page  Notification Reason Critical Result (Potassium 2.7)  Date Critical Result Received 08/28/23  Time Critical Result Received 0630   Awaiting Orders

## 2023-08-28 NOTE — Progress Notes (Signed)
PROGRESS NOTE    Patient: Kristi Sawyer                            PCP: Dettinger, Elige Radon, MD                    DOB: [birth year]            DOA: 08/26/2023 ZOX:[id number]             DOS: 08/28/2023, 12:46 PM   LOS: 1 day   Date of Service: The patient was seen and examined on 08/28/2023  Subjective:   The patient was seen and examined this morning. Hemodynamically stable. No issues overnight .  Brief Narrative:   Kristi Sawyer is a 89 yo Female HTN, HLD, anxiety, GERD, osteoporosis presented with recurrent falls.  Last fall earlier on the day of admission..  She described a fall to the ED staff as a mechanical fall.  At baseline ambulates with assist of walker, apparently she fell forward and landed on top of her walker.  Reports of no passing out spells or dizziness.  She pressed her life alert where EMS responded and brought her to the ED.  Arrival she was not complaining of any area with pain.  Denied hitting her head or passing out.  Recent hospitalization for chronic generalized weaknesses, lives alone at home.   ED course/evaluation: Blood pressure (!) 148/75, pulse 84, temperature 97.8 F (36.6 C), temperature source Oral, resp. rate 15, height 5' (1.524 m), weight 42.3 kg, SpO2 95%.  WBC 18.2, neutrophils 15.6, sodium 126, chloride 92, blood glucose level 132, AST 64, ALT 55,..  Troponin as high as 85, -EKG: Normal sinus rhythm, rate 88, QTc 437, -CT angio chest: No evidence of PE, no acute fractures, groundglass attenuation on the right upper lobe tree-in-bud appearance possible infectious, moderate amount of retained stool in colon-diffuse coronary calcification - CT abdomen pelvis_as above -Chest x-ray-clear Patient was given IV antibiotics Rocephin/azithromycin, IV fluids,      Assessment & Plan:   Principal Problem:   SIRS (systemic inflammatory response syndrome) (HCC) Active Problems:   Debility   CAP (community acquired pneumonia)   Hyponatremia    Hyperlipidemia   ANXIETY   Ataxic gait   GERD (gastroesophageal reflux disease)   Hypertension   Fall at home, initial encounter   Generalized weakness     Assessment and Plan: * SIRS (systemic inflammatory response syndrome) (HCC) -On arrival: Tachypneic, tachycardic, with leukocytosis with WBC of 18.2, lactic acid 1.3  -Hemodynamically stable, improved leukocytosis Current vitals. Blood pressure 138/68, pulse 73, temperature 98.6 F (37 C), RR 16, SpO2 95%.   -Source likely pneumonia -finding on CT -Started on IV antibiotics Rocephin/azithromycin -Will follow-up with blood cultures -Continue gentle IV fluid hydration  Hyponatremia - Last sodium level 08/09/2023 137 Poa: Na 126 >>> 133  -Likely due to poor nutrition, dehydration-much improved -S/p gentle IV hydration -Encouraging p.o. intake -Will monitor closely  CAP (community acquired pneumonia) -Stable satting 95% on room air, afebrile, normotensive - Finding based on CT scan, with leukocytosis -Initiated on IV antibiotics Rocephin/azithromycin will continue -Will follow-up with cultures, will try to obtain sputum culture  Hypokalemia: Serum potassium 2.7-repleting with p.o. KCl  debility - Acute on chronic, associated with falls, -Fall precautions -Consulting PT T for evaluation recommendations--likely will need placement, rehab  Ataxic gait - Chronic ataxia gait, with severe generalized weaknesses uses walker at baseline -  Consulting PT OT for evaluation  ANXIETY - Monitoring as needed small dose Xanax  Hyperlipidemia - Withholding now, checking total CK due to falls -Anticipating discontinuing her medication as at this age risk outweighs the benefits  Fall at home, initial encounter - Accidental falls, with chronic debility, gait ataxia -Consulting PT OT for evaluation likely will need placement, rehab -Assist with all ADLs, -CT chest abdomen reviewed, no obvious signs of physical  trauma  Hypertension -Resuming home medication with caution -As needed hydralazine  GERD (gastroesophageal reflux disease) -Continue PPI   ---------------------------------------------------------------------------------------------------------------------------------------- Nutritional status:  The patient's BMI is: Body mass index is 17.78 kg/m. I agree with the assessment and plan as outlined  Skin Assessment: I have examined the patient's skin and I agree with the wound assessment as performed by wound care team --------------------------------------------------------------------------------------------------------------------------------------------- Cultures; Urine Culture  >>> NGT   ---------------------------------------------------------------------------------------------------------------------------------------------  DVT prophylaxis:  heparin injection 5,000 Units Start: 08/27/23 0715 TED hose Start: 08/27/23 0707 SCDs Start: 08/27/23 0707   Code Status:   Code Status: Full Code  Family Communication: No family member present at bedside- attempt will be made to update daily -Advance care planning has been discussed.   Admission status:   Status is: Inpatient Remains inpatient appropriate because: The antibiotics   Disposition: From  - home             Planning for discharge to SNF in next 24 hours  Procedures:   No admission procedures for hospital encounter.   Antimicrobials:  Anti-infectives (From admission, onward)    Start     Dose/Rate Route Frequency Ordered Stop   08/28/23 0500  azithromycin (ZITHROMAX) 500 mg in sodium chloride 0.9 % 250 mL IVPB        500 mg 250 mL/hr over 60 Minutes Intravenous Every 24 hours 08/27/23 0710 09/02/23 0459   08/28/23 0400  cefTRIAXone (ROCEPHIN) 1 g in sodium chloride 0.9 % 100 mL IVPB        1 g 200 mL/hr over 30 Minutes Intravenous Every 24 hours 08/27/23 0710 09/02/23 0359   08/27/23 0400  cefTRIAXone  (ROCEPHIN) 1 g in sodium chloride 0.9 % 100 mL IVPB        1 g 200 mL/hr over 30 Minutes Intravenous  Once 08/27/23 0345 08/27/23 0427   08/27/23 0400  azithromycin (ZITHROMAX) 500 mg in sodium chloride 0.9 % 250 mL IVPB        500 mg 250 mL/hr over 60 Minutes Intravenous  Once 08/27/23 0345 08/27/23 0609        Medication:   amLODipine  5 mg Oral Daily   ascorbic acid  500 mg Oral Daily   aspirin EC  81 mg Oral Daily   cholecalciferol  2,000 Units Oral Once per day on Monday Tuesday Wednesday Thursday Friday   heparin  5,000 Units Subcutaneous Q8H   lidocaine  1 patch Transdermal Q24H   metoCLOPramide  5 mg Oral QHS   multivitamin with minerals  1 tablet Oral Daily   pantoprazole  40 mg Oral Daily   sodium chloride flush  3 mL Intravenous Q12H   zinc sulfate  220 mg Oral Daily    acetaminophen **OR** acetaminophen, bisacodyl, hydrALAZINE, HYDROmorphone (DILAUDID) injection, ipratropium, levalbuterol, ondansetron **OR** ondansetron (ZOFRAN) IV, oxyCODONE, senna-docusate, sodium chloride flush, sodium phosphate, traZODone   Objective:   Vitals:   08/27/23 1602 08/27/23 2147 08/28/23 0237 08/28/23 0322  BP: (!) 148/78 (!) 161/79 138/68   Pulse: 80 80 73   Resp:  16 18 16    Temp: 98 F (36.7 C) 98.1 F (36.7 C) 98.6 F (37 C)   TempSrc: Oral     SpO2: 98% 98% 95%   Weight:    41.3 kg  Height:        Intake/Output Summary (Last 24 hours) at 08/28/2023 1246 Last data filed at 08/28/2023 0837 Gross per 24 hour  Intake 1218.4 ml  Output 200 ml  Net 1018.4 ml   Filed Weights   08/26/23 2202 08/27/23 0837 08/28/23 0322  Weight: 42.3 kg 41.5 kg 41.3 kg     Physical examination:    General:  AAO x 3,  cooperative, no distress;   HEENT:  Normocephalic, PERRL, otherwise with in Normal limits   Neuro:  CNII-XII intact. , normal motor and sensation, reflexes intact   Lungs:   Clear to auscultation BL, Respirations unlabored,  No wheezes / crackles  Cardio:    S1/S2,  RRR, No murmure, No Rubs or Gallops   Abdomen:  Soft, non-tender, bowel sounds active all four quadrants, no guarding or peritoneal signs.  Muscular  skeletal:  Limited exam -global generalized weaknesses - in bed, able to move all 4 extremities,   2+ pulses,  symmetric, No pitting edema  Skin:  Dry, warm to touch, negative for any Rashes,  Wounds: Please see nursing documentation           ------------------------------------------------------------------------------------------------------------------------------------------    LABs:     Latest Ref Rng & Units 08/28/2023    4:20 AM 08/26/2023   11:52 PM 08/09/2023    3:24 PM  CBC  WBC 4.0 - 10.5 K/uL 10.2  18.2  9.6   Hemoglobin 12.0 - 15.0 g/dL 13.0  86.5  78.4   Hematocrit 36.0 - 46.0 % 37.1  39.7  42.8   Platelets 150 - 400 K/uL 231  250  294       Latest Ref Rng & Units 08/28/2023    4:20 AM 08/26/2023   11:52 PM 08/09/2023    3:24 PM  CMP  Glucose 70 - 99 mg/dL 696  295  94   BUN 8 - 23 mg/dL 7  12  12    Creatinine 0.44 - 1.00 mg/dL 2.84  1.32  4.40   Sodium 135 - 145 mmol/L 133  126  137   Potassium 3.5 - 5.1 mmol/L 2.7  3.5  4.6   Chloride 98 - 111 mmol/L 98  92  98   CO2 22 - 32 mmol/L 27  24  25    Calcium 8.9 - 10.3 mg/dL 8.6  9.0  10.2   Total Protein 6.5 - 8.1 g/dL  6.0  6.1   Total Bilirubin 0.3 - 1.2 mg/dL  0.5  0.3   Alkaline Phos 38 - 126 U/L  48  50   AST 15 - 41 U/L  64  17   ALT 0 - 44 U/L  55  16        Micro Results Recent Results (from the past 240 hour(s))  Blood Culture (routine x 2)     Status: None (Preliminary result)   Collection Time: 08/27/23 12:01 AM   Specimen: Left Antecubital; Blood  Result Value Ref Range Status   Specimen Description LEFT ANTECUBITAL  Final   Special Requests   Final    BOTTLES DRAWN AEROBIC AND ANAEROBIC Blood Culture adequate volume   Culture   Final    NO GROWTH 1 DAY Performed at [facility], [address], [place], [place] [address]    Report  Status PENDING  Incomplete    Radiology Reports No results found.  SIGNED: Kendell Bane, MD, FHM. FAAFP. [facility] - [facility] Time spent - 35 min.  In seeing, evaluating and examining the patient. Reviewing medical records, labs, drawn plan of care. [facility],  Pager (please use [email or web address] to page/ text) Please use Epic Secure Chat for non-urgent communication (7AM-7PM)  If 7PM-7AM, please contact night-coverage [email or web address], 08/28/2023, 12:46 PM

## 2023-08-28 NOTE — Plan of Care (Signed)

## 2023-08-28 NOTE — Plan of Care (Signed)
  Problem: Acute Rehab OT Goals (only OT should resolve) Goal: Pt. Will Perform Grooming Flowsheets (Taken 08/28/2023 1501) Pt Will Perform Grooming:  with supervision  standing Goal: Pt. Will Perform Upper Body Dressing Flowsheets (Taken 08/28/2023 1501) Pt Will Perform Upper Body Dressing:  with modified independence  sitting Goal: Pt. Will Perform Lower Body Dressing Flowsheets (Taken 08/28/2023 1501) Pt Will Perform Lower Body Dressing:  with supervision  sitting/lateral leans  sit to/from stand Goal: Pt. Will Transfer To Toilet Flowsheets (Taken 08/28/2023 1501) Pt Will Transfer to Toilet:  with supervision  ambulating  regular height toilet  bedside commode Goal: Pt. Will Perform Toileting-Clothing Manipulation Flowsheets (Taken 08/28/2023 1501) Pt Will Perform Toileting - Clothing Manipulation and hygiene:  with supervision  sitting/lateral leans  sit to/from stand

## 2023-08-29 DIAGNOSIS — E871 Hypo-osmolality and hyponatremia: Secondary | ICD-10-CM

## 2023-08-29 DIAGNOSIS — Z515 Encounter for palliative care: Secondary | ICD-10-CM | POA: Diagnosis not present

## 2023-08-29 DIAGNOSIS — Y92009 Unspecified place in unspecified non-institutional (private) residence as the place of occurrence of the external cause: Secondary | ICD-10-CM

## 2023-08-29 DIAGNOSIS — W19XXXA Unspecified fall, initial encounter: Secondary | ICD-10-CM | POA: Diagnosis not present

## 2023-08-29 DIAGNOSIS — Z7189 Other specified counseling: Secondary | ICD-10-CM | POA: Diagnosis not present

## 2023-08-29 DIAGNOSIS — J189 Pneumonia, unspecified organism: Secondary | ICD-10-CM | POA: Diagnosis not present

## 2023-08-29 DIAGNOSIS — R6510 Systemic inflammatory response syndrome (SIRS) of non-infectious origin without acute organ dysfunction: Secondary | ICD-10-CM | POA: Diagnosis not present

## 2023-08-29 LAB — BASIC METABOLIC PANEL
Anion gap: 9 (ref 5–15)
BUN: 12 mg/dL (ref 8–23)
CO2: 27 mmol/L (ref 22–32)
Calcium: 8.9 mg/dL (ref 8.9–10.3)
Chloride: 98 mmol/L (ref 98–111)
Creatinine, Ser: 0.55 mg/dL (ref 0.44–1.00)
GFR, Estimated: >60 mL/min (ref >60)
Glucose, Bld: 111 mg/dL — ABNORMAL HIGH (ref 70–99)
Potassium: 3.4 mmol/L — ABNORMAL LOW (ref 3.5–5.1)
Sodium: 134 mmol/L — ABNORMAL LOW (ref 135–145)

## 2023-08-29 LAB — CBC
HCT: 37.6 % (ref 36.0–46.0)
Hemoglobin: 12.8 g/dL (ref 12.0–15.0)
MCH: 31.5 pg (ref 26.0–34.0)
MCHC: 34 g/dL (ref 30.0–36.0)
MCV: 92.6 fL (ref 80.0–100.0)
Platelets: 233 10*3/uL (ref 150–400)
RBC: 4.06 MIL/uL (ref 3.87–5.11)
RDW: 13.5 % (ref 11.5–15.5)
WBC: 9.9 10*3/uL (ref 4.0–10.5)
nRBC: 0 % (ref 0.0–0.2)

## 2023-08-29 LAB — GLUCOSE, CAPILLARY: Glucose-Capillary: 98 mg/dL (ref 70–99)

## 2023-08-29 MED ORDER — K PHOS MONO-SOD PHOS DI & MONO 155-852-130 MG PO TABS
500.0000 mg | ORAL_TABLET | Freq: Two times a day (BID) | ORAL | Status: DC
  Administered 2023-08-29 – 2023-08-30 (×3): 500 mg via ORAL
  Filled 2023-08-29 (×3): qty 2

## 2023-08-29 NOTE — Progress Notes (Signed)
PROGRESS NOTE  RAYE WIENS HYQ:[id number] DOB: 10-09-1926 DOA: 08/26/2023 PCP: Dettinger, Elige Radon, MD  Brief History:  Kristi Sawyer is a 89 yo Female HTN, HLD, anxiety, GERD, osteoporosis presented with recurrent falls.  Last fall earlier on the day of admission..  She described a fall to the ED staff as a mechanical fall.  At baseline ambulates with assist of walker, apparently she fell forward and landed on top of her walker.  Reports of no passing out spells or dizziness.  She pressed her life alert where EMS responded and brought her to the ED.  Arrival she was not complaining of any area with pain.  Denied hitting her head or passing out.  Recent hospitalization for chronic generalized weaknesses, lives alone at home.   ED course/evaluation: Blood pressure (!) 148/75, pulse 84, temperature 97.8 F (36.6 C), temperature source Oral, resp. rate 15, height 5' (1.524 m), weight 42.3 kg, SpO2 95%.  WBC 18.2, neutrophils 15.6, sodium 126, chloride 92, blood glucose level 132, AST 64, ALT 55,..  Troponin as high as 85, -EKG: Normal sinus rhythm, rate 88, QTc 437, -CT angio chest: No evidence of PE, no acute fractures, groundglass attenuation on the right upper lobe tree-in-bud appearance possible infectious, moderate amount of retained stool in colon-diffuse coronary calcification - CT abdomen pelvis_as above -Chest x-ray-clear Patient was given IV antibiotics Rocephin/azithromycin, IV fluids,     Assessment/Plan: Sepsis -On arrival: Tachypneic, tachycardic, with leukocytosis with WBC of 18.2, lactic acid 1.3 -due to pneumonia -Hemodynamically stable, improved leukocytosis Vitals on admission: Blood pressure 138/68, pulse 73, temperature 98.6 F (37 C), RR 16, SpO2 95%. -08/26/2023 CTA chest--negative PE, mild GGO in the right hilum.  Few scattered pulmonary nodules in the right upper lobe with some tree-in-bud configuration -Started on IV antibiotics  Rocephin/azithromycin (10/14) -10/14 blood cultures--neg to date -Continue gentle IV fluid hydration   Hyponatremia - presented with Na 126 -due to volume depletion and poor solute intake -S/p gentle IV hydration>>improved -Encouraging p.o. intake   CAP (community acquired pneumonia) -Stable satting 95% on room air, afebrile, normotensive - Finding based on CT scan, with leukocytosis -Initiated on IV antibiotics Rocephin/azithromycin will continue   Hypokalemia/hypophosphatemia Serum potassium 2.7-repleting with p.o. KCl   debility - Acute on chronic, associated with falls, -Fall precautions -Consulting PT   Gait instability - Chronic ataxia gait, with severe generalized weaknesses uses walker at baseline -PT eval    Hyperlipidemia - Withholding statin with elevated CK    Fall at home, initial encounter - Accidental falls, with chronic debility, gait ataxia -Consulting PT OT for evaluation likely will need placement, rehab -Assist with all ADLs, -CT chest abdomen reviewed, no obvious signs of physical trauma   Hypertension -Resuming home amlodipine   GERD (gastroesophageal reflux disease) -Continue PPI         Family Communication:  no Family at bedside  Consultants:  none  Code Status:  FULL  DVT Prophylaxis:  [place] Heparin    Procedures: As Listed in Progress Note Above  Antibiotics: Ceftriaxone 10/14>> Azithro 10/14>>      Subjective: Patient denies fevers, chills, headache, chest pain, dyspnea, nausea, vomiting, diarrhea, abdominal pain, dysuria, hematuria, hematochezia, and melena.   Objective: Vitals:   08/28/23 2206 08/29/23 0411 08/29/23 0416 08/29/23 0839  BP: (!) 148/82  139/88 (!) 157/85  Pulse: 79  71 72  Resp: 20  18   Temp: 98.4 F (36.9 C)  97.6  F (36.4 C)   TempSrc: Oral  Oral   SpO2: 97%  97% 97%  Weight:  41.6 kg    Height:        Intake/Output Summary (Last 24 hours) at 08/29/2023 1746 Last data filed at  08/29/2023 0930 Gross per 24 hour  Intake 360 ml  Output --  Net 360 ml   Weight change: 0.05 kg Exam:  General:  Pt is alert, follows commands appropriately, not in acute distress HEENT: No icterus, No thrush, No neck mass, [place]/AT Cardiovascular: RRR, S1/S2, no rubs, no gallops Respiratory: bibasilar rales.  No wheeze Abdomen: Soft/+BS, non tender, non distended, no guarding Extremities: No edema, No lymphangitis, No petechiae, No rashes, no synovitis   Data Reviewed: I have personally reviewed following labs and imaging studies Basic Metabolic Panel: Recent Labs  Lab 08/26/23 2352 08/27/23 0730 08/28/23 0420 08/29/23 0402  NA 126*  --  133* 134*  K 3.5  --  2.7* 3.4*  CL 92*  --  98 98  CO2 24  --  27 27  GLUCOSE 132*  --  110* 111*  BUN 12  --  7* 12  CREATININE 0.53  --  0.41* 0.55  CALCIUM 9.0  --  8.6* 8.9  MG 1.9  --   --   --   PHOS  --  2.4*  --   --    Liver Function Tests: Recent Labs  Lab 08/26/23 2352  AST 64*  ALT 55*  ALKPHOS 48  BILITOT 0.5  PROT 6.0*  ALBUMIN 3.8   No results for input(s): "LIPASE", "AMYLASE" in the last 168 hours. No results for input(s): "AMMONIA" in the last 168 hours. Coagulation Profile: No results for input(s): "INR", "PROTIME" in the last 168 hours. CBC: Recent Labs  Lab 08/26/23 2352 08/28/23 0420 08/29/23 0402  WBC 18.2* 10.2 9.9  NEUTROABS 15.6*  --   --   HGB 13.4 12.4 12.8  HCT 39.7 37.1 37.6  MCV 92.1 93.0 92.6  PLT 250 231 233   Cardiac Enzymes: Recent Labs  Lab 08/27/23 0730  CKTOTAL 722*  CKMB 12.1*   BNP: Invalid input(s): "POCBNP" CBG: Recent Labs  Lab 08/29/23 0745  GLUCAP 98   HbA1C: No results for input(s): "HGBA1C" in the last 72 hours. Urine analysis:    Component Value Date/Time   COLORURINE YELLOW 08/26/2023 2340   APPEARANCEUR CLEAR 08/26/2023 2340   LABSPEC 1.006 08/26/2023 2340   PHURINE 6.0 08/26/2023 2340   GLUCOSEU NEGATIVE 08/26/2023 2340   HGBUR MODERATE (A)  08/26/2023 2340   BILIRUBINUR NEGATIVE 08/26/2023 2340   BILIRUBINUR neg 07/07/2014 1033   KETONESUR NEGATIVE 08/26/2023 2340   PROTEINUR 30 (A) 08/26/2023 2340   UROBILINOGEN negative 07/07/2014 1033   NITRITE NEGATIVE 08/26/2023 2340   LEUKOCYTESUR TRACE (A) 08/26/2023 2340   Sepsis Labs: @LABRCNTIP (procalcitonin:4,lacticidven:4) ) Recent Results (from the past 240 hour(s))  Blood Culture (routine x 2)     Status: None (Preliminary result)   Collection Time: 08/27/23 12:01 AM   Specimen: Left Antecubital; Blood  Result Value Ref Range Status   Specimen Description LEFT ANTECUBITAL  Final   Special Requests   Final    BOTTLES DRAWN AEROBIC AND ANAEROBIC Blood Culture adequate volume   Culture   Final    NO GROWTH 2 DAYS Performed at [facility], [address], [place], [place] [address]    Report Status PENDING  Incomplete     Scheduled Meds:  amLODipine  5 mg  Oral Daily   ascorbic acid  500 mg Oral Daily   aspirin EC  81 mg Oral Daily   cholecalciferol  2,000 Units Oral Once per day on Monday Tuesday Wednesday Thursday Friday   heparin  5,000 Units Subcutaneous Q8H   lidocaine  1 patch Transdermal Q24H   metoCLOPramide  5 mg Oral QHS   multivitamin with minerals  1 tablet Oral Daily   pantoprazole  40 mg Oral Daily   phosphorus  500 mg Oral BID   sodium chloride flush  3 mL Intravenous Q12H   zinc sulfate  220 mg Oral Daily   Continuous Infusions:  azithromycin Stopped (08/29/23 0700)   cefTRIAXone (ROCEPHIN)  IV Stopped (08/29/23 0530)    Procedures/Studies: CT HEAD WO CONTRAST ( )  Result Date: 08/27/2023 CLINICAL DATA:  Provided history: Mental status change, unknown cause. Additional history provided: Fall, weakness. EXAM: CT HEAD WITHOUT CONTRAST TECHNIQUE: Contiguous axial images were obtained from the base of the skull through the vertex without intravenous contrast. RADIATION DOSE REDUCTION: This exam was performed according to the departmental  dose-optimization program which includes automated exposure control, adjustment of the mA and/or kV according to patient size and/or use of iterative reconstruction technique. COMPARISON:  Prior head CT examinations 07/26/2023 and earlier. FINDINGS: Brain: Cerebral atrophy. Patchy and ill-defined hypoattenuation within the cerebral white matter, nonspecific but compatible with moderate chronic small vessel ischemic disease. There is no acute intracranial hemorrhage. No demarcated cortical infarct. No extra-axial fluid collection. No evidence of an intracranial mass. No midline shift. Vascular: No hyperdense vessel.  Atherosclerotic calcifications. Skull and upper cervical spine: No acute fracture or aggressive osseous lesion. Visible sinuses/orbits: No orbital mass or acute orbital finding. No significant inflammatory paranasal sinus disease at the imaged levels. Other: 1.4 cm ovoid cutaneous/subcutaneous hyperdense lesion within the right frontoparietal scalp again noted, and likely reflecting a sebaceous cyst. IMPRESSION: 1.  No evidence of an acute intracranial abnormality. 2. Moderate chronic small vessel ischemic changes within the cerebral white matter. 3. Cerebral atrophy. Electronically Signed   By: Jackey Loge D.O.   On: 08/27/2023 07:58   CT Angio Chest PE W and/or Wo Contrast  Result Date: 08/27/2023 CLINICAL DATA:  Pulmonary embolism suspected, high probability. Fall and weakness. Abdominal trauma, blunt. EXAM: CT ANGIOGRAPHY CHEST CT ABDOMEN AND PELVIS WITH CONTRAST TECHNIQUE: Multidetector CT imaging of the chest was performed using the standard protocol during bolus administration of intravenous contrast. Multiplanar CT image reconstructions and MIPs were obtained to evaluate the vascular anatomy. Multidetector CT imaging of the abdomen and pelvis was performed using the standard protocol during bolus administration of intravenous contrast. RADIATION DOSE REDUCTION: This exam was performed  according to the departmental dose-optimization program which includes automated exposure control, adjustment of the mA and/or kV according to patient size and/or use of iterative reconstruction technique. CONTRAST:  75mL OMNIPAQUE IOHEXOL 350 MG/ML SOLN COMPARISON:  None Available. FINDINGS: CTA CHEST FINDINGS Cardiovascular: The heart is mildly enlarged and there is no pericardial effusion. Few scattered coronary artery calcifications are present. There is atherosclerotic calcification of the aorta without evidence of aneurysm. The pulmonary trunk is normal in caliber. No evidence of pulmonary embolism is seen. Mediastinum/Nodes: No mediastinal, hilar, or axillary lymphadenopathy. There is a 4 mm hypodensity in the right lobe of the thyroid gland. No additional imaging is recommended. The trachea is within normal limits. There is mild thickening of the walls of the mid esophagus. Lungs/Pleura: Atelectasis is present bilaterally. No effusion or pneumothorax. Mild ground-glass  attenuation is present at the right hilum. There are few scattered pulmonary nodules in the right upper lobe, some with tree-in-bud configuration. Musculoskeletal: Multiple old healed rib fractures are noted on the right. No acute fracture is seen. Review of the MIP images confirms the above findings. CT ABDOMEN and PELVIS FINDINGS Hepatobiliary: No focal liver abnormality is seen. No gallstones, gallbladder wall thickening, or biliary dilatation. Pancreas: Unremarkable. No pancreatic ductal dilatation or surrounding inflammatory changes. Spleen: Normal in size without focal abnormality. Adrenals/Urinary Tract: The adrenal glands are within normal limits. The kidneys enhance symmetrically. A subcentimeter hypodensity is noted in the mid right kidney which is too small to further characterize. No renal calculus bilaterally. Extrarenal pelvises are present bilaterally. There is no obstructive uropathy. A bladder diverticulum is present on the  right. Stomach/Bowel: Stomach is within normal limits. Appendix is not seen. No evidence of bowel wall thickening, distention, or inflammatory changes. No free air or pneumatosis. A moderate amount of retained stool is present in the colon. Vascular/Lymphatic: Aortic atherosclerosis. No enlarged abdominal or pelvic lymph nodes. Reproductive: Status post hysterectomy. No adnexal masses. Other: No abdominopelvic ascites. Musculoskeletal: Degenerative changes are present in the lumbar spine. Review of the MIP images confirms the above findings. IMPRESSION: 1. No evidence of pulmonary embolism. 2. No acute fracture or solid organ injury. 3. Ground-glass attenuation with a few scattered nodular opacities in the right upper lobe, some with a tree-in-bud configuration, which may be infectious or inflammatory. 4. Moderate amount of retained stool in the colon. 5. Aortic atherosclerosis and coronary artery calcifications. Electronically Signed   By: Thornell Sartorius M.D.   On: 08/27/2023 03:20   CT ABDOMEN PELVIS W CONTRAST  Result Date: 08/27/2023 CLINICAL DATA:  Pulmonary embolism suspected, high probability. Fall and weakness. Abdominal trauma, blunt. EXAM: CT ANGIOGRAPHY CHEST CT ABDOMEN AND PELVIS WITH CONTRAST TECHNIQUE: Multidetector CT imaging of the chest was performed using the standard protocol during bolus administration of intravenous contrast. Multiplanar CT image reconstructions and MIPs were obtained to evaluate the vascular anatomy. Multidetector CT imaging of the abdomen and pelvis was performed using the standard protocol during bolus administration of intravenous contrast. RADIATION DOSE REDUCTION: This exam was performed according to the departmental dose-optimization program which includes automated exposure control, adjustment of the mA and/or kV according to patient size and/or use of iterative reconstruction technique. CONTRAST:  75mL OMNIPAQUE IOHEXOL 350 MG/ML SOLN COMPARISON:  None Available.  FINDINGS: CTA CHEST FINDINGS Cardiovascular: The heart is mildly enlarged and there is no pericardial effusion. Few scattered coronary artery calcifications are present. There is atherosclerotic calcification of the aorta without evidence of aneurysm. The pulmonary trunk is normal in caliber. No evidence of pulmonary embolism is seen. Mediastinum/Nodes: No mediastinal, hilar, or axillary lymphadenopathy. There is a 4 mm hypodensity in the right lobe of the thyroid gland. No additional imaging is recommended. The trachea is within normal limits. There is mild thickening of the walls of the mid esophagus. Lungs/Pleura: Atelectasis is present bilaterally. No effusion or pneumothorax. Mild ground-glass attenuation is present at the right hilum. There are few scattered pulmonary nodules in the right upper lobe, some with tree-in-bud configuration. Musculoskeletal: Multiple old healed rib fractures are noted on the right. No acute fracture is seen. Review of the MIP images confirms the above findings. CT ABDOMEN and PELVIS FINDINGS Hepatobiliary: No focal liver abnormality is seen. No gallstones, gallbladder wall thickening, or biliary dilatation. Pancreas: Unremarkable. No pancreatic ductal dilatation or surrounding inflammatory changes. Spleen: Normal in size without  focal abnormality. Adrenals/Urinary Tract: The adrenal glands are within normal limits. The kidneys enhance symmetrically. A subcentimeter hypodensity is noted in the mid right kidney which is too small to further characterize. No renal calculus bilaterally. Extrarenal pelvises are present bilaterally. There is no obstructive uropathy. A bladder diverticulum is present on the right. Stomach/Bowel: Stomach is within normal limits. Appendix is not seen. No evidence of bowel wall thickening, distention, or inflammatory changes. No free air or pneumatosis. A moderate amount of retained stool is present in the colon. Vascular/Lymphatic: Aortic atherosclerosis.  No enlarged abdominal or pelvic lymph nodes. Reproductive: Status post hysterectomy. No adnexal masses. Other: No abdominopelvic ascites. Musculoskeletal: Degenerative changes are present in the lumbar spine. Review of the MIP images confirms the above findings. IMPRESSION: 1. No evidence of pulmonary embolism. 2. No acute fracture or solid organ injury. 3. Ground-glass attenuation with a few scattered nodular opacities in the right upper lobe, some with a tree-in-bud configuration, which may be infectious or inflammatory. 4. Moderate amount of retained stool in the colon. 5. Aortic atherosclerosis and coronary artery calcifications. Electronically Signed   By: Thornell Sartorius M.D.   On: 08/27/2023 03:20   DG Pelvis Portable  Result Date: 08/26/2023 CLINICAL DATA:  Fall EXAM: PORTABLE PELVIS 1-2 VIEWS COMPARISON:  None Available. FINDINGS: There is no evidence of pelvic fracture or diastasis. No pelvic bone lesions are seen. Degenerative changes in the visualized lower lumbar spine. IMPRESSION: No acute bony abnormality. Electronically Signed   By: Charlett Nose M.D.   On: 08/26/2023 23:51   DG Chest Port 1 View  Result Date: 08/26/2023 CLINICAL DATA:  Questionable sepsis - evaluate for abnormality EXAM: PORTABLE CHEST 1 VIEW COMPARISON:  07/26/2023 FINDINGS: Heart and mediastinal contours are within normal limits. No focal opacities or effusions. No acute bony abnormality. Aortic atherosclerosis. Mild hyperinflation. IMPRESSION: No active disease. Electronically Signed   By: Charlett Nose M.D.   On: 08/26/2023 23:51    [name], DO  [facility]  If 7PM-7AM, please contact night-coverage [email or web address] Password [email or web address] 08/29/2023, 5:46 PM   LOS: 2 days

## 2023-08-29 NOTE — TOC Progression Note (Signed)
Transition of Care [facility]) - Progression Note    Patient Details  Name: Kristi Sawyer MRN: [id number] Date of Birth: 12-02-1925  Transition of Care [facility]) CM/SW Contact  Erin Sons, [place] Phone Number: 08/29/2023, 9:58 AM  Clinical Narrative:     CSW met with pt bedside to discuss disposition. Pt confirms plan to DC to SNF. CSW provides SNF bed offers. Pt chooses [facility]. CSW confirmed with [facility] that pt can admit if SNF auth and DC summary are in by [facility]. If not, then pt can admit tomorrow. Pt stated that she would be able to have a friend give her a ride to facility.   CSW called [employer] and added facility to pt's SNF auth.  Auth Status: Pending  Expected Discharge Plan: Skilled Nursing Facility Barriers to Discharge: Insurance Authorization  Expected Discharge Plan and Services In-house Referral: Clinical Social Work   Post Acute Care Choice: Skilled Nursing Facility Living arrangements for the past 2 months: Single Family Home                                       Social Determinants of Health (SDOH) Interventions SDOH Screenings   Food Insecurity: No Food Insecurity (08/27/2023)  Housing: Low Risk  (08/27/2023)  Transportation Needs: No Transportation Needs (08/27/2023)  Utilities: Not At Risk (08/27/2023)  Alcohol Screen: Low Risk  (12/15/2022)  Depression (PHQ2-9): Low Risk  (08/09/2023)  Financial Resource Strain: Low Risk  (12/15/2022)  Physical Activity: Insufficiently Active (12/15/2022)  Social Connections: Moderately Integrated (12/15/2022)  Stress: No Stress Concern Present (12/15/2022)  Tobacco Use: Low Risk  (08/27/2023)    Readmission Risk Interventions     No data to display

## 2023-08-29 NOTE — Plan of Care (Signed)

## 2023-08-29 NOTE — Progress Notes (Signed)
Palliative: Kristi Sawyer is sitting up in the [place] chair in her room.  She appears chronically ill and somewhat frail, elderly.  She greets me, making and mostly keeping eye contact.  She is alert and oriented, able to make her needs known.  There is no family at bedside at this time.  We talked about her acute health problems and the treatment plan.  Overall she seems knowledgeable.  We talked about goal for short-term rehab at [facility], that she will likely transfer tomorrow.  I encouraged her to continue with daily PT exercises when she is at home.  Her incentive spirometer is present at bedside.  She is able to demonstrate the use very well.  We talked about CODE STATUS.  At this point Kristi Sawyer states that she would want attempted resuscitation.  I ask how long she would except life support, and she shares that she has not considered this.  We talked about some realities of long-term life support and I encouraged her to consider these choices and let her healthcare surrogate know her wishes.  She states understanding.  Conference with bedside nursing staff, transition of care team related to patient condition, needs, goals of care, disposition.  Plan: Continue full scope/full code.  Time for outcomes.  Short-term rehab at [facility] with ultimate goal of returning home.  35 minutes Lillia Carmel, NP Palliative medicine team Phone [phone]

## 2023-08-29 NOTE — Progress Notes (Signed)
Went over discharge instructions w/ pt and pt sister Dennison Nancy.

## 2023-08-29 NOTE — TOC Progression Note (Signed)
Transition of Care [facility]) - Progression Note    Patient Details  Name: Kristi Sawyer MRN: [id number] Date of Birth: [birth year]  Transition of Care [facility]) CM/SW Contact  Mercedies Ganesh Aris Lot, [place] Phone Number: 08/29/2023, 3:56 PM  Clinical Narrative:     [facility] still pending at this time  Expected Discharge Plan: Skilled Nursing Facility Barriers to Discharge: Insurance Authorization  Expected Discharge Plan and Services In-house Referral: Clinical Social Work   Post Acute Care Choice: Skilled Nursing Facility Living arrangements for the past 2 months: Single Family Home                                       Social Determinants of Health (SDOH) Interventions SDOH Screenings   Food Insecurity: No Food Insecurity (08/27/2023)  Housing: Low Risk  (08/27/2023)  Transportation Needs: No Transportation Needs (08/27/2023)  Utilities: Not At Risk (08/27/2023)  Alcohol Screen: Low Risk  (12/15/2022)  Depression (PHQ2-9): Low Risk  (08/09/2023)  Financial Resource Strain: Low Risk  (12/15/2022)  Physical Activity: Insufficiently Active (12/15/2022)  Social Connections: Moderately Integrated (12/15/2022)  Stress: No Stress Concern Present (12/15/2022)  Tobacco Use: Low Risk  (08/27/2023)    Readmission Risk Interventions     No data to display

## 2023-08-29 NOTE — Progress Notes (Signed)
Physical Therapy Treatment Patient Details Name: Kristi Sawyer MRN: [id number] DOB: 1926-08-10 Today's Date: 08/29/2023   History of Present Illness Kristi Sawyer is a 89 yo Female HTN, HLD, anxiety, GERD, osteoporosis presented with recurrent falls.  Last fall earlier on the day of admission..  She described a fall to the ED staff as a mechanical fall.  At baseline ambulates with assist of walker, apparently she fell forward and landed on top of her walker.  Reports of no passing out spells or dizziness.  She pressed her life alert where EMS responded and brought her to the ED.     Arrival she was not complaining of any area with pain.  Denied hitting her head or passing out.   Recent hospitalization for chronic generalized weaknesses, lives alone at home.    PT Comments  Pt sitting in recliner, agreeable to therapy, voiced need to use restroom.  Pt with CG-SBA with transfers/gait due to instability and weakness.  Unable to stand on first attempt having to sit back down.  Pt required max assist to remove undergarments/replace as well as gown due to clothing wet from urine while sitting in recliner. Pt continued with ambulation following washing hands at sink for 100 feet with RW. Pt with general instabiliies, improving gait quality following initial 30 feet.  Pt returned to recliner following ambulation.  Minimal fatigue noted or complaints of pain upon return.  PT reports she feels likes she is close to being back to her functional norm.    If plan is discharge home, recommend the following: A little help with walking and/or transfers;A little help with bathing/dressing/bathroom;Help with stairs or ramp for entrance;Assistance with cooking/housework   Can travel by private vehicle        Equipment Recommendations  None recommended by PT       Precautions / Restrictions Precautions Precautions: Fall Restrictions Weight Bearing Restrictions: No     Mobility  Bed Mobility Overal bed  mobility: Modified Independent                  Transfers Overall transfer level: Needs assistance Equipment used: Rolling walker (2 wheels) Transfers: Sit to/from Stand, Bed to chair/wheelchair/BSC Sit to Stand: Supervision   Step pivot transfers: Supervision       General transfer comment: increasd time, labored movement with mild difficulty transferring to/from commode in bathroom    Ambulation/Gait Ambulation/Gait assistance: Supervision Gait Distance (Feet): 100 Feet Assistive device: Rolling walker (2 wheels) Gait Pattern/deviations: Decreased step length - right, Decreased step length - left, Decreased stride length Gait velocity: decreased     General Gait Details: slow slightly labored cadence without loss of balance, limited mostly due to fatigue          Cognition Arousal: Alert Behavior During Therapy: WFL for tasks assessed/performed Overall Cognitive Status: Within Functional Limits for tasks assessed                                                 Pertinent Vitals/Pain Pain Assessment Pain Assessment: No/denies pain           PT Goals (current goals can now be found in the care plan section)      Frequency    Min 3X/week      PT Plan  Continue Progression       AM-PAC PT "  6 Clicks" Mobility   Outcome Measure  Help needed turning from your back to your side while in a flat bed without using bedrails?: None Help needed moving from lying on your back to sitting on the side of a flat bed without using bedrails?: None Help needed moving to and from a bed to a chair (including a wheelchair)?: A Little Help needed standing up from a chair using your arms (e.g., wheelchair or bedside chair)?: A Little Help needed to walk in hospital room?: A Little Help needed climbing 3-5 steps with a railing? : A Little 6 Click Score: 20    End of Session Equipment Utilized During Treatment: Gait belt Activity Tolerance:  Patient tolerated treatment well;Patient limited by fatigue Patient left: in chair;with call bell/phone within reach Nurse Communication: Mobility status PT Visit Diagnosis: Unsteadiness on feet (R26.81);Other abnormalities of gait and mobility (R26.89);Muscle weakness (generalized) (M62.81)     Time: 6295-2841 PT Time Calculation (min) (ACUTE ONLY): 23 min  Charges:    $Gait Training: 8-22 mins $Therapeutic Activity: 8-22 mins PT General Charges $$ ACUTE PT VISIT: 1 Visit                     Lurena Nida, PTA/CLT [facility] Ph: [phone]    Lurena Nida 08/29/2023, 11:11 AM

## 2023-08-30 DIAGNOSIS — A419 Sepsis, unspecified organism: Secondary | ICD-10-CM | POA: Diagnosis not present

## 2023-08-30 DIAGNOSIS — E871 Hypo-osmolality and hyponatremia: Secondary | ICD-10-CM | POA: Diagnosis not present

## 2023-08-30 DIAGNOSIS — Z515 Encounter for palliative care: Secondary | ICD-10-CM | POA: Diagnosis not present

## 2023-08-30 DIAGNOSIS — J189 Pneumonia, unspecified organism: Secondary | ICD-10-CM | POA: Diagnosis not present

## 2023-08-30 DIAGNOSIS — Z7189 Other specified counseling: Secondary | ICD-10-CM | POA: Diagnosis not present

## 2023-08-30 DIAGNOSIS — R6510 Systemic inflammatory response syndrome (SIRS) of non-infectious origin without acute organ dysfunction: Secondary | ICD-10-CM | POA: Diagnosis not present

## 2023-08-30 DIAGNOSIS — W19XXXA Unspecified fall, initial encounter: Secondary | ICD-10-CM | POA: Diagnosis not present

## 2023-08-30 LAB — BASIC METABOLIC PANEL
Anion gap: 8 (ref 5–15)
BUN: 8 mg/dL (ref 8–23)
CO2: 29 mmol/L (ref 22–32)
Calcium: 8.6 mg/dL — ABNORMAL LOW (ref 8.9–10.3)
Chloride: 97 mmol/L — ABNORMAL LOW (ref 98–111)
Creatinine, Ser: 0.51 mg/dL (ref 0.44–1.00)
GFR, Estimated: >60 mL/min (ref >60)
Glucose, Bld: 103 mg/dL — ABNORMAL HIGH (ref 70–99)
Potassium: 3 mmol/L — ABNORMAL LOW (ref 3.5–5.1)
Sodium: 134 mmol/L — ABNORMAL LOW (ref 135–145)

## 2023-08-30 LAB — CBC
HCT: 35.9 % — ABNORMAL LOW (ref 36.0–46.0)
Hemoglobin: 12.4 g/dL (ref 12.0–15.0)
MCH: 32 pg (ref 26.0–34.0)
MCHC: 34.5 g/dL (ref 30.0–36.0)
MCV: 92.5 fL (ref 80.0–100.0)
Platelets: 216 10*3/uL (ref 150–400)
RBC: 3.88 MIL/uL (ref 3.87–5.11)
RDW: 13.4 % (ref 11.5–15.5)
WBC: 8.8 10*3/uL (ref 4.0–10.5)
nRBC: 0 % (ref 0.0–0.2)

## 2023-08-30 LAB — PHOSPHORUS: Phosphorus: 4.6 mg/dL (ref 2.5–4.6)

## 2023-08-30 LAB — GLUCOSE, CAPILLARY
Glucose-Capillary: 100 mg/dL — ABNORMAL HIGH (ref 70–99)
Glucose-Capillary: 101 mg/dL — ABNORMAL HIGH (ref 70–99)

## 2023-08-30 LAB — CK: Total CK: 65 U/L (ref 38–234)

## 2023-08-30 LAB — MAGNESIUM: Magnesium: 1.9 mg/dL (ref 1.7–2.4)

## 2023-08-30 MED ORDER — POTASSIUM CHLORIDE CRYS ER 20 MEQ PO TBCR: 20.0000 meq | EXTENDED_RELEASE_TABLET | Freq: Every day | ORAL | Status: DC

## 2023-08-30 MED ORDER — CEFDINIR 300 MG PO CAPS: 300.0000 mg | ORAL_CAPSULE | Freq: Two times a day (BID) | ORAL | Status: DC

## 2023-08-30 MED ORDER — CEFDINIR 300 MG PO CAPS: 300.0000 mg | ORAL_CAPSULE | Freq: Two times a day (BID) | ORAL | 0 refills | Status: DC

## 2023-08-30 MED ORDER — AZITHROMYCIN 500 MG PO TABS: 500.0000 mg | ORAL_TABLET | Freq: Every day | ORAL | 0 refills | Status: DC

## 2023-08-30 MED ORDER — POTASSIUM CHLORIDE CRYS ER 20 MEQ PO TBCR
40.0000 meq | EXTENDED_RELEASE_TABLET | Freq: Once | ORAL | Status: AC
  Administered 2023-08-30: 40 meq via ORAL
  Filled 2023-08-30: qty 2

## 2023-08-30 MED ORDER — AZITHROMYCIN 250 MG PO TABS: 500.0000 mg | ORAL_TABLET | Freq: Every day | ORAL | Status: DC

## 2023-08-30 MED ORDER — POTASSIUM CHLORIDE CRYS ER 20 MEQ PO TBCR: 20.0000 meq | EXTENDED_RELEASE_TABLET | Freq: Every day | ORAL | 0 refills | Status: AC

## 2023-08-30 NOTE — Care Management Important Message (Signed)
Important Message  Patient Details  Name: Kristi Sawyer MRN: [id number] Date of Birth: [birth year]   Important Message Given:  Yes - Medicare IM     Corey Harold 08/30/2023, 10:43 AM

## 2023-08-30 NOTE — Progress Notes (Signed)
Palliative:   Kristi Sawyer is sitting up in the [place] chair in her room.  She appears elderly and somewhat frail.  She greets me, making and mostly keeping eye contact.  She is alert and oriented, able to make her needs known.  There is no family at bedside at this time.    We talk about her plan for discharge to home today.  I share that although it would have been nice to have more intense rehab, it is good that she strong enough to go home on her own.  She tells me that she has been having home health twice a week in her home.  He I share that this may increase, and encouraged her to do exercises on her own on the days physical therapy is not present.  We also talk about the use of incentive spirometer that is at bedside.  I encouraged her to take this device with her and continue to use it at home to exercise her lungs.  She states agreement.  I asked who can help her at home.  Kristi Sawyer shares that her friend, [name] will be out of town for a few weeks.  She tells me that she does have a church family she can lean on.  I greatly encouraged her to do so.  Conference with attending, bedside nursing staff, and transition of care team related to patient condition, needs, GOC and disposition.   Plan: At this point continue full scope/full code.  Home with the benefits of home health.  35 minutes  Lillia Carmel, NP Palliative Medicine Team  Team Phone [phone]

## 2023-08-30 NOTE — Plan of Care (Signed)

## 2023-08-30 NOTE — Progress Notes (Signed)
Physical Therapy Treatment Patient Details Name: Kristi Sawyer MRN: [id number] DOB: 1926-03-12 Today's Date: 08/30/2023   History of Present Illness Kristi Sawyer is a 89 yo Female HTN, HLD, anxiety, GERD, osteoporosis presented with recurrent falls.  Last fall earlier on the day of admission..  She described a fall to the ED staff as a mechanical fall.  At baseline ambulates with assist of walker, apparently she fell forward and landed on top of her walker.  Reports of no passing out spells or dizziness.  She pressed her life alert where EMS responded and brought her to the ED.     Arrival she was not complaining of any area with pain.  Denied hitting her head or passing out.   Recent hospitalization for chronic generalized weaknesses, lives alone at home.    PT Comments  Pt up in chair, agreeable to working with therapist.  Still with a little difficutly/weakness with standing, however much improved and at baseline according to patient.  Ambulated using RW (as did PTA) with cues for posturing and increasing stride with gait.  Pt returned to chair.   Encouraged to continue therex and gait when returns home   If plan is discharge home, recommend the following: A little help with walking and/or transfers;A little help with bathing/dressing/bathroom;Help with stairs or ramp for entrance;Assistance with cooking/housework     Equipment Recommendations  None recommended by PT       Precautions / Restrictions Restrictions Weight Bearing Restrictions: No     Mobility  Bed Mobility Overal bed mobility: Modified Independent                  Transfers Overall transfer level: Needs assistance Equipment used: Rolling walker (2 wheels) Transfers: Sit to/from Stand, Bed to chair/wheelchair/BSC Sit to Stand: Supervision   Step pivot transfers: Supervision       General transfer comment: increased time, instabiliites upon initial standing.    Ambulation/Gait Ambulation/Gait assistance:  Supervision Gait Distance (Feet): 75 Feet Assistive device: Rolling walker (2 wheels) Gait Pattern/deviations: Decreased step length - right, Decreased step length - left, Decreased stride length Gait velocity: decreased     General Gait Details: slow without loss of balance          Cognition Arousal: Alert Behavior During Therapy: WFL for tasks assessed/performed Overall Cognitive Status: Within Functional Limits for tasks assessed                                                 Pertinent Vitals/Pain Pain Assessment Pain Assessment: No/denies pain           PT Goals (current goals can now be found in the care plan section)      Frequency    Min 3X/week          AM-PAC PT "6 Clicks" Mobility   Outcome Measure  Help needed turning from your back to your side while in a flat bed without using bedrails?: None Help needed moving from lying on your back to sitting on the side of a flat bed without using bedrails?: None Help needed moving to and from a bed to a chair (including a wheelchair)?: A Little Help needed standing up from a chair using your arms (e.g., wheelchair or bedside chair)?: A Little Help needed to walk in hospital room?: A Little Help needed climbing  3-5 steps with a railing? : A Little 6 Click Score: 20    End of Session Equipment Utilized During Treatment: Gait belt Activity Tolerance: Patient tolerated treatment well;Patient limited by fatigue Patient left: in chair;with call bell/phone within reach Nurse Communication: Mobility status PT Visit Diagnosis: Unsteadiness on feet (R26.81);Other abnormalities of gait and mobility (R26.89);Muscle weakness (generalized) (M62.81)     Time: 1610-9604 PT Time Calculation (min) (ACUTE ONLY): 15 min  Charges:    $Gait Training: 8-22 mins PT General Charges $$ ACUTE PT VISIT: 1 Visit                     Lurena Nida, PTA/CLT [facility] Ph: [phone]    Lurena Nida 08/30/2023, 9:32 AM

## 2023-08-30 NOTE — TOC Transition Note (Signed)
Transition of Care [facility]) - CM/SW Discharge Note   Patient Details  Name: Kristi Sawyer MRN: [id number] Date of Birth: 1926/07/16  [facility] of [facility] CM/SW Contact:  Elliot Gault, LCSW Phone Number: 08/30/2023, 11:31 AM   Clinical Narrative:     Received update from pt's insurance stating that her Berkley Harvey is not approved for SNF. Spoke with pt who is agreeable to return home with HHPT from [place] as she previously had.  Updated [facility] at [place].   No other TOC needs for dc.  Final next level of care: Home w Home Health Services Barriers to Discharge: Barriers Resolved   Patient Goals and CMS Choice CMS Medicare.gov Compare Post Acute Care list provided to:: Patient Choice offered to / list presented to : Patient  Discharge Placement                         Discharge Plan and Services Additional resources added to the After Visit Summary for   In-house Referral: Clinical Social Work   Post Acute Care Choice: Skilled Nursing Facility                               Social Determinants of Health (SDOH) Interventions SDOH Screenings   Food Insecurity: No Food Insecurity (08/27/2023)  Housing: Low Risk  (08/27/2023)  Transportation Needs: No Transportation Needs (08/27/2023)  Utilities: Not At Risk (08/27/2023)  Alcohol Screen: Low Risk  (12/15/2022)  Depression (PHQ2-9): Low Risk  (08/09/2023)  Financial Resource Strain: Low Risk  (12/15/2022)  Physical Activity: Insufficiently Active (12/15/2022)  Social Connections: Moderately Integrated (12/15/2022)  Stress: No Stress Concern Present (12/15/2022)  Tobacco Use: Low Risk  (08/27/2023)     Readmission Risk Interventions     No data to display

## 2023-08-30 NOTE — Progress Notes (Addendum)
Kristi Sawyer [phone] at bedside to take pt home

## 2023-08-30 NOTE — Discharge Summary (Addendum)
Physician Discharge Summary   Patient: Kristi Sawyer MRN: [id number] DOB: November 11, 1926  Admit date:     08/26/2023  Discharge date: 08/30/23  Discharge Physician: Onalee Hua Cobie Marcoux   PCP: Dettinger, Elige Radon, MD   Recommendations at discharge:   Please follow up with primary care provider within 1-2 weeks  Please repeat BMP  in one week    Hospital Course: [name] is a 89 yo Female HTN, HLD, anxiety, GERD, osteoporosis presented with recurrent falls.  Last fall earlier on the day of admission..  She described a fall to the ED staff as a mechanical fall.  At baseline ambulates with assist of walker, apparently she fell forward and landed on top of her walker.  Reports of no passing out spells or dizziness.  She pressed her life alert where EMS responded and brought her to the ED.  Arrival she was not complaining of any area with pain.  Denied hitting her head or passing out.  Recent hospitalization for chronic generalized weaknesses, lives alone at home.     ED course/evaluation: Blood pressure (!) 148/75, pulse 84, temperature 97.8 F (36.6 C), temperature source Oral, resp. rate 15, height 5' (1.524 m), weight 42.3 kg, SpO2 95%.  WBC 18.2, neutrophils 15.6, sodium 126, chloride 92, blood glucose level 132, AST 64, ALT 55,..  Troponin as high as 85, -EKG: Normal sinus rhythm, rate 88, QTc 437, -CT angio chest: No evidence of PE, no acute fractures, groundglass attenuation on the right upper lobe tree-in-bud appearance possible infectious, moderate amount of retained stool in colon-diffuse coronary calcification - CT abdomen pelvis_as above -Chest x-ray-clear Patient was given IV antibiotics Rocephin/azithromycin, IV fluids The patient gradually improved clinically.  Her Na improved and she remained afebrile and hemodynamically stable on RA.    Assessment and Plan:  Sepsis -On arrival: Tachypneic, tachycardic, with leukocytosis with WBC of 18.2, lactic acid 1.3 -due to  pneumonia -Hemodynamically stable, improved leukocytosis Vitals on admission: Blood pressure 138/68, pulse 73, temperature 98.6 F (37 C), RR 16, SpO2 95%. -08/26/2023 CTA chest--negative PE, mild GGO in the right hilum.  Few scattered pulmonary nodules in the right upper lobe with some tree-in-bud configuration -Started on IV antibiotics Rocephin/azithromycin (10/14) -10/14 blood cultures--neg to date -Continued gentle IV fluid hydration -sepsis physiology resolved -d/c home with cefdinir and azithro x 3 more days   Hyponatremia - presented with Na 126 -due to volume depletion and poor solute intake -S/p gentle IV hydration>>improved -Encouraging p.o. intake -Na 134 on day of d   CAP (community acquired pneumonia) -Stable satting 95% on room air, afebrile, normotensive - Finding based on CT scan, with leukocytosis -Initiated on IV antibiotics Rocephin/azithromycin will continue   Hypokalemia/hypophosphatemia Serum potassium 2.7-repleting with p.o. KCl -d/c home with KCl 20 mEQ daily and repeat BMP in one week   debility - Acute on chronic, associated with falls, -Fall precautions -Consulting PT>>HHPT   Gait instability - Chronic ataxia gait, with severe generalized weaknesses uses walker at baseline -PT eval>>HHPT     Hyperlipidemia - Withholding statin with elevated CK- -CK back to normal on day of d/c--722>>65 -resume statin upon d/c   Fall at home, initial encounter - Accidental falls, with chronic debility, gait ataxia -Consulting PT OT for evaluation likely will need placement, rehab -Assist with all ADLs, -CT chest abdomen reviewed, no obvious signs of physical trauma   Hypertension -Resuming home amlodipine   GERD (gastroesophageal reflux disease) -Continue PPI  Consultants: none Procedures performed: none  Disposition: Home Diet recommendation:  Regular diet DISCHARGE MEDICATION: Allergies as of 08/30/2023       Reactions   Prevnar  13 [pneumococcal 13-val Conj Vacc] Hives   Red/streaking arm   Penicillins Hives   Tetanus Toxoids Other (See Comments)   Area of injection was red and streaked.   Codeine Nausea Only   Sulfonamide Derivatives Nausea Only        Medication List     TAKE these medications    acetaminophen 325 MG tablet Commonly known as: TYLENOL Take 2 tablets (650 mg total) by mouth every 6 (six) hours as needed for mild pain (or Fever >/= 101).   amLODipine 5 MG tablet Commonly known as: NORVASC Take 1 tablet (5 mg total) by mouth daily.   azithromycin 500 MG tablet Commonly known as: ZITHROMAX Take 1 tablet (500 mg total) by mouth daily. Start taking on: August 31, 2023   cefdinir 300 MG capsule Commonly known as: OMNICEF Take 1 capsule (300 mg total) by mouth every 12 (twelve) hours. Start taking on: August 31, 2023   CENTRUM SILVER PO Take 1 tablet by mouth daily.   [employer] Vitamin C 500 MG tablet Generic drug: ascorbic acid TAKE 1 TABLET (500 MG TOTAL) BY MOUTH DAILY.   Fish Oil 1000 MG Caps Take 1 capsule by mouth 3 (three) times daily.   Gemtesa 75 MG Tabs Generic drug: Vibegron Take 1 tablet by mouth daily.   Integra 62.5-62.5-40-3 MG Caps Take 1 capsule by mouth daily.   metoCLOPramide 10 MG tablet Commonly known as: REGLAN TAKE 1 TABLET BY MOUTH EVERYDAY AT BEDTIME   omeprazole 20 MG capsule Commonly known as: PRILOSEC PLEASE MAKE A FOLLOW UP APPOINTMENT. (TAKE1-2 CAPSULES BY MOUTH DAILY(20-40MG )   potassium chloride SA 20 MEQ tablet Commonly known as: KLOR-CON M Take 1 tablet (20 mEq total) by mouth daily. Start taking on: August 31, 2023   rosuvastatin 5 MG tablet Commonly known as: CRESTOR TAKE 1 TABLET BY MOUTH ON MONDAY, WEDNESDAY, AND FRIDAY   Vitamin D 50 MCG (2000 UT) Caps Take by mouth as directed. Take 1 tablet daily except none on saturdays and sundays   zinc sulfate 220 (50 Zn) MG capsule TAKE 1 CAPSULE BY MOUTH EVERY DAY         Discharge Exam: Filed Weights   08/28/23 0322 08/29/23 0411 08/30/23 0415  Weight: 41.3 kg 41.6 kg 41.8 kg   HEENT:  [place]/AT, No thrush, no icterus CV:  RRR, no rub, no S3, no S4 Lung:  bibasilar rales.  No wheeze Abd:  soft/+BS, NT Ext:  No edema, no lymphangitis, no synovitis, no rash   Condition at discharge: stable  The results of significant diagnostics from this hospitalization (including imaging, microbiology, ancillary and laboratory) are listed below for reference.   Imaging Studies: CT HEAD WO CONTRAST ( )  Result Date: 08/27/2023 CLINICAL DATA:  Provided history: Mental status change, unknown cause. Additional history provided: Fall, weakness. EXAM: CT HEAD WITHOUT CONTRAST TECHNIQUE: Contiguous axial images were obtained from the base of the skull through the vertex without intravenous contrast. RADIATION DOSE REDUCTION: This exam was performed according to the departmental dose-optimization program which includes automated exposure control, adjustment of the mA and/or kV according to patient size and/or use of iterative reconstruction technique. COMPARISON:  Prior head CT examinations 07/26/2023 and earlier. FINDINGS: Brain: Cerebral atrophy. Patchy and ill-defined hypoattenuation within the cerebral white matter, nonspecific but compatible with moderate chronic small  vessel ischemic disease. There is no acute intracranial hemorrhage. No demarcated cortical infarct. No extra-axial fluid collection. No evidence of an intracranial mass. No midline shift. Vascular: No hyperdense vessel.  Atherosclerotic calcifications. Skull and upper cervical spine: No acute fracture or aggressive osseous lesion. Visible sinuses/orbits: No orbital mass or acute orbital finding. No significant inflammatory paranasal sinus disease at the imaged levels. Other: 1.4 cm ovoid cutaneous/subcutaneous hyperdense lesion within the right frontoparietal scalp again noted, and likely reflecting a sebaceous  cyst. IMPRESSION: 1.  No evidence of an acute intracranial abnormality. 2. Moderate chronic small vessel ischemic changes within the cerebral white matter. 3. Cerebral atrophy. Electronically Signed   By: Jackey Loge D.O.   On: 08/27/2023 07:58   CT Angio Chest PE W and/or Wo Contrast  Result Date: 08/27/2023 CLINICAL DATA:  Pulmonary embolism suspected, high probability. Fall and weakness. Abdominal trauma, blunt. EXAM: CT ANGIOGRAPHY CHEST CT ABDOMEN AND PELVIS WITH CONTRAST TECHNIQUE: Multidetector CT imaging of the chest was performed using the standard protocol during bolus administration of intravenous contrast. Multiplanar CT image reconstructions and MIPs were obtained to evaluate the vascular anatomy. Multidetector CT imaging of the abdomen and pelvis was performed using the standard protocol during bolus administration of intravenous contrast. RADIATION DOSE REDUCTION: This exam was performed according to the departmental dose-optimization program which includes automated exposure control, adjustment of the mA and/or kV according to patient size and/or use of iterative reconstruction technique. CONTRAST:  75mL OMNIPAQUE IOHEXOL 350 MG/ML SOLN COMPARISON:  None Available. FINDINGS: CTA CHEST FINDINGS Cardiovascular: The heart is mildly enlarged and there is no pericardial effusion. Few scattered coronary artery calcifications are present. There is atherosclerotic calcification of the aorta without evidence of aneurysm. The pulmonary trunk is normal in caliber. No evidence of pulmonary embolism is seen. Mediastinum/Nodes: No mediastinal, hilar, or axillary lymphadenopathy. There is a 4 mm hypodensity in the right lobe of the thyroid gland. No additional imaging is recommended. The trachea is within normal limits. There is mild thickening of the walls of the mid esophagus. Lungs/Pleura: Atelectasis is present bilaterally. No effusion or pneumothorax. Mild ground-glass attenuation is present at the right  hilum. There are few scattered pulmonary nodules in the right upper lobe, some with tree-in-bud configuration. Musculoskeletal: Multiple old healed rib fractures are noted on the right. No acute fracture is seen. Review of the MIP images confirms the above findings. CT ABDOMEN and PELVIS FINDINGS Hepatobiliary: No focal liver abnormality is seen. No gallstones, gallbladder wall thickening, or biliary dilatation. Pancreas: Unremarkable. No pancreatic ductal dilatation or surrounding inflammatory changes. Spleen: Normal in size without focal abnormality. Adrenals/Urinary Tract: The adrenal glands are within normal limits. The kidneys enhance symmetrically. A subcentimeter hypodensity is noted in the mid right kidney which is too small to further characterize. No renal calculus bilaterally. Extrarenal pelvises are present bilaterally. There is no obstructive uropathy. A bladder diverticulum is present on the right. Stomach/Bowel: Stomach is within normal limits. Appendix is not seen. No evidence of bowel wall thickening, distention, or inflammatory changes. No free air or pneumatosis. A moderate amount of retained stool is present in the colon. Vascular/Lymphatic: Aortic atherosclerosis. No enlarged abdominal or pelvic lymph nodes. Reproductive: Status post hysterectomy. No adnexal masses. Other: No abdominopelvic ascites. Musculoskeletal: Degenerative changes are present in the lumbar spine. Review of the MIP images confirms the above findings. IMPRESSION: 1. No evidence of pulmonary embolism. 2. No acute fracture or solid organ injury. 3. Ground-glass attenuation with a few scattered nodular opacities in the  right upper lobe, some with a tree-in-bud configuration, which may be infectious or inflammatory. 4. Moderate amount of retained stool in the colon. 5. Aortic atherosclerosis and coronary artery calcifications. Electronically Signed   By: Thornell Sartorius M.D.   On: 08/27/2023 03:20   CT ABDOMEN PELVIS W  CONTRAST  Result Date: 08/27/2023 CLINICAL DATA:  Pulmonary embolism suspected, high probability. Fall and weakness. Abdominal trauma, blunt. EXAM: CT ANGIOGRAPHY CHEST CT ABDOMEN AND PELVIS WITH CONTRAST TECHNIQUE: Multidetector CT imaging of the chest was performed using the standard protocol during bolus administration of intravenous contrast. Multiplanar CT image reconstructions and MIPs were obtained to evaluate the vascular anatomy. Multidetector CT imaging of the abdomen and pelvis was performed using the standard protocol during bolus administration of intravenous contrast. RADIATION DOSE REDUCTION: This exam was performed according to the departmental dose-optimization program which includes automated exposure control, adjustment of the mA and/or kV according to patient size and/or use of iterative reconstruction technique. CONTRAST:  75mL OMNIPAQUE IOHEXOL 350 MG/ML SOLN COMPARISON:  None Available. FINDINGS: CTA CHEST FINDINGS Cardiovascular: The heart is mildly enlarged and there is no pericardial effusion. Few scattered coronary artery calcifications are present. There is atherosclerotic calcification of the aorta without evidence of aneurysm. The pulmonary trunk is normal in caliber. No evidence of pulmonary embolism is seen. Mediastinum/Nodes: No mediastinal, hilar, or axillary lymphadenopathy. There is a 4 mm hypodensity in the right lobe of the thyroid gland. No additional imaging is recommended. The trachea is within normal limits. There is mild thickening of the walls of the mid esophagus. Lungs/Pleura: Atelectasis is present bilaterally. No effusion or pneumothorax. Mild ground-glass attenuation is present at the right hilum. There are few scattered pulmonary nodules in the right upper lobe, some with tree-in-bud configuration. Musculoskeletal: Multiple old healed rib fractures are noted on the right. No acute fracture is seen. Review of the MIP images confirms the above findings. CT ABDOMEN  and PELVIS FINDINGS Hepatobiliary: No focal liver abnormality is seen. No gallstones, gallbladder wall thickening, or biliary dilatation. Pancreas: Unremarkable. No pancreatic ductal dilatation or surrounding inflammatory changes. Spleen: Normal in size without focal abnormality. Adrenals/Urinary Tract: The adrenal glands are within normal limits. The kidneys enhance symmetrically. A subcentimeter hypodensity is noted in the mid right kidney which is too small to further characterize. No renal calculus bilaterally. Extrarenal pelvises are present bilaterally. There is no obstructive uropathy. A bladder diverticulum is present on the right. Stomach/Bowel: Stomach is within normal limits. Appendix is not seen. No evidence of bowel wall thickening, distention, or inflammatory changes. No free air or pneumatosis. A moderate amount of retained stool is present in the colon. Vascular/Lymphatic: Aortic atherosclerosis. No enlarged abdominal or pelvic lymph nodes. Reproductive: Status post hysterectomy. No adnexal masses. Other: No abdominopelvic ascites. Musculoskeletal: Degenerative changes are present in the lumbar spine. Review of the MIP images confirms the above findings. IMPRESSION: 1. No evidence of pulmonary embolism. 2. No acute fracture or solid organ injury. 3. Ground-glass attenuation with a few scattered nodular opacities in the right upper lobe, some with a tree-in-bud configuration, which may be infectious or inflammatory. 4. Moderate amount of retained stool in the colon. 5. Aortic atherosclerosis and coronary artery calcifications. Electronically Signed   By: Thornell Sartorius M.D.   On: 08/27/2023 03:20   DG Pelvis Portable  Result Date: 08/26/2023 CLINICAL DATA:  Fall EXAM: PORTABLE PELVIS 1-2 VIEWS COMPARISON:  None Available. FINDINGS: There is no evidence of pelvic fracture or diastasis. No pelvic bone lesions are seen.  Degenerative changes in the visualized lower lumbar spine. IMPRESSION: No acute  bony abnormality. Electronically Signed   By: Charlett Nose M.D.   On: 08/26/2023 23:51   DG Chest Port 1 View  Result Date: 08/26/2023 CLINICAL DATA:  Questionable sepsis - evaluate for abnormality EXAM: PORTABLE CHEST 1 VIEW COMPARISON:  07/26/2023 FINDINGS: Heart and mediastinal contours are within normal limits. No focal opacities or effusions. No acute bony abnormality. Aortic atherosclerosis. Mild hyperinflation. IMPRESSION: No active disease. Electronically Signed   By: Charlett Nose M.D.   On: 08/26/2023 23:51    Microbiology: Results for orders placed or performed during the hospital encounter of 08/26/23  Blood Culture (routine x 2)     Status: None (Preliminary result)   Collection Time: 08/27/23 12:01 AM   Specimen: Left Antecubital; Blood  Result Value Ref Range Status   Specimen Description LEFT ANTECUBITAL  Final   Special Requests   Final    BOTTLES DRAWN AEROBIC AND ANAEROBIC Blood Culture adequate volume   Culture   Final    NO GROWTH 3 DAYS Performed at [facility], [address] [place], [place] [address]    Report Status PENDING  Incomplete    Labs: CBC: Recent Labs  Lab 08/26/23 2352 08/28/23 0420 08/29/23 0402 08/30/23 0445  WBC 18.2* 10.2 9.9 8.8  NEUTROABS 15.6*  --   --   --   HGB 13.4 12.4 12.8 12.4  HCT 39.7 37.1 37.6 35.9*  MCV 92.1 93.0 92.6 92.5  PLT 250 231 233 216   Basic Metabolic Panel: Recent Labs  Lab 08/26/23 2352 08/27/23 0730 08/28/23 0420 08/29/23 0402 08/30/23 0445  NA 126*  --  133* 134* 134*  K 3.5  --  2.7* 3.4* 3.0*  CL 92*  --  98 98 97*  CO2 24  --  27 27 29   GLUCOSE 132*  --  110* 111* 103*  BUN 12  --  7* 12 8  CREATININE 0.53  --  0.41* 0.55 0.51  CALCIUM 9.0  --  8.6* 8.9 8.6*  MG 1.9  --   --   --  1.9  PHOS  --  2.4*  --   --  4.6   Liver Function Tests: Recent Labs  Lab 08/26/23 2352  AST 64*  ALT 55*  ALKPHOS 48  BILITOT 0.5  PROT 6.0*  ALBUMIN 3.8   CBG: Recent Labs  Lab 08/29/23 0745  08/30/23 0725  GLUCAP 98 100*    Discharge time spent: greater than 30 minutes.  Signed: [name], MD [facility] 08/30/2023

## 2023-08-30 NOTE — Progress Notes (Signed)
Called peggy rogers who is listed as emergency contact to inform of pt dc at [phone] no answer [phone]

## 2023-08-31 ENCOUNTER — Telehealth: Payer: Self-pay

## 2023-08-31 NOTE — Transitions of Care (Post Inpatient/ED Visit) (Signed)
08/31/2023  Name: Kristi Sawyer MRN: [id number] DOB: Jan 15, 1926  Today's TOC FU Call Status: Today's TOC FU Call Status:: Successful TOC FU Call Completed TOC FU Call Complete Date: 08/31/23 Patient's Name and Date of Birth confirmed.  Transition Care Management Follow-up Telephone Call Date of Discharge: 08/30/23 [facility] Type of Discharge: Inpatient Admission Primary Inpatient Discharge Diagnosis:: Pneumonia How have you been since you were released from the hospital?: Better (Patient notes she is doing very well) Any questions or concerns?: No  Items Reviewed: Did you receive and understand the discharge instructions provided?: Yes Medications obtained,verified, and reconciled?: Yes (Medications Reviewed) Any new allergies since your discharge?: No Dietary orders reviewed?: Yes Type of Diet Ordered:: Regular Do you have support at home?: Yes People in Home: friend(s) Name of Support/Comfort Primary Source: Next door neighbor is a Engineer, civil (consulting), church friends  Medications Reviewed Today: Medications Reviewed Today     Reviewed by [name], RN (Case Manager) on 08/31/23 at 1139  Med List Status: <None>   Medication Order Taking? Sig Documenting Provider Last Dose Status Informant  acetaminophen (TYLENOL) 325 MG tablet [id number] Yes Take 2 tablets (650 mg total) by mouth every 6 (six) hours as needed for mild pain (or Fever >/= 101). Shon Hale, MD Taking Active Self, Pharmacy Records  amLODipine (NORVASC) 5 MG tablet [id number] Yes Take 1 tablet (5 mg total) by mouth daily. Dettinger, Elige Radon, MD Taking Active Self, Pharmacy Records           Med Note [name] Aug 27, 2023  9:41 AM)    azithromycin (ZITHROMAX) 500 MG tablet [id number] Yes Take 1 tablet (500 mg total) by mouth daily. [name], MD Taking Active   cefdinir (OMNICEF) 300 MG capsule [id number] Yes Take 1 capsule (300 mg total) by mouth every 12 (twelve) hours. [name], MD Taking Active   Cholecalciferol (VITAMIN D) 2000 UNITS CAPS [id number] No Take by mouth as directed. Take 1 tablet daily except none on saturdays and sundays  Patient not taking: Reported on 08/27/2023   [provider] Not Taking Active Self, Pharmacy Records  [employer] VITAMIN C 500 MG tablet [id number] Yes TAKE 1 TABLET (500 MG TOTAL) BY MOUTH DAILY. Dettinger, Elige Radon, MD Taking Active Self, Pharmacy Records  Fe Fum-FePoly-Vit C-Vit B3 [facility]) 62.5-62.5-40-3 MG CAPS [id number] Yes Take 1 capsule by mouth daily. [provider] Taking Active Self, Pharmacy Records  GEMTESA 75 MG TABS [id number] Yes Take 1 tablet by mouth daily. [provider] Taking Active Self, Pharmacy Records  metoCLOPramide (REGLAN) 10 MG tablet [id number] Yes TAKE 1 TABLET BY MOUTH EVERYDAY AT BEDTIME [name], MD Taking Active Self, Pharmacy Records  Multiple Vitamins-Minerals (CENTRUM SILVER PO) [id number] Yes Take 1 tablet by mouth daily. [provider] Taking Active Self, Pharmacy Records  Omega-3 Fatty Acids (FISH OIL) 1000 MG CAPS [id number] Yes Take 1 capsule by mouth 3 (three) times daily.  [provider] Taking Active Self, Pharmacy Records  omeprazole (PRILOSEC) 20 MG capsule [id number] Yes PLEASE MAKE A FOLLOW UP APPOINTMENT. (TAKE1-2 CAPSULES BY MOUTH DAILY(20-40MG ) [name], PA-C Taking Active Self, Pharmacy Records           Med Note [id number], Illene Labrador   Fri Jul 27, 2023  4:29 PM) Pt is unsure if she is currently taking this.  potassium chloride SA (KLOR-CON M) 20 MEQ tablet [id number] Yes Take 1 tablet (20 mEq total) by mouth daily.  [name], MD Taking Active   rosuvastatin (CRESTOR) 5 MG tablet [id number] Yes TAKE 1 TABLET BY MOUTH ON MONDAY, WEDNESDAY, AND FRIDAY Dettinger, Elige Radon, MD Taking Active Self, Pharmacy Records  zinc sulfate 220 (50 Zn) MG capsule [id number] Yes TAKE 1 CAPSULE BY MOUTH EVERY DAY Dettinger, Elige Radon, MD Taking Active Self,  Pharmacy Records            Home Care and Equipment/Supplies: Were Home Health Services Ordered?: Yes [facility] Has Agency set up a time to come to your home?: Yes First Home Health Visit Date: 09/04/23 Any new equipment or medical supplies ordered?: No  Functional Questionnaire: Do you need assistance with bathing/showering or dressing?: No Do you need assistance with meal preparation?: No Do you need assistance with eating?: No Do you have difficulty maintaining continence: Yes Do you need assistance with getting out of bed/getting out of a chair/moving?: No Do you have difficulty managing or taking your medications?: No  Follow up appointments reviewed: PCP Follow-up appointment confirmed?: No MD Provider Line Number:[phone] Given: No (Patient states she wishes to call and make appt for HFU) Specialist Hospital Follow-up appointment confirmed?: NA Do you need transportation to your follow-up appointment?: No Do you understand care options if your condition(s) worsen?: Yes-patient verbalized understanding  SDOH Interventions Today    Flowsheet Row Most Recent Value  SDOH Interventions   Food Insecurity Interventions Intervention Not Indicated  Housing Interventions Intervention Not Indicated       Goals Addressed             This Visit's Progress    30 day TOC Program       Current Barriers:  Knowledge Deficits related to plan of care for management of pneumonia and Hx of falls   RNCM Clinical Goal(s):  Patient will work with the Care Management team over the next 30 days to address Transition of Care Barriers: Medication access Medication Management Support at home Provider appointments Home Health services through collaboration with RN Care manager, provider, and care team.   Interventions: Evaluation of current treatment plan related to  self management and patient's adherence to plan as established by  provider  Transitions of Care:  New goal. Doctor Visits  - discussed the importance of doctor visits Post discharge activity limitations prescribed by provider reviewed  Patient Goals/Self-Care Activities: Participate in Transition of Care Program/Attend [facility] scheduled calls Notify RN Care Manager of TOC call rescheduling needs Take all medications as prescribed Attend all scheduled provider appointments Call pharmacy for medication refills 3-7 days in advance of running out of medications          TOC Interventions Today    Flowsheet Row Most Recent Value  TOC Interventions   TOC Interventions Discussed/Reviewed TOC Interventions Discussed, TOC Interventions Reviewed, Contacted provider for patient needs  [facility] admin staff and let them know patient needs HFU. Patient to call but they should follow up if she doesn't]       [name] RN, BSN, CCM RN Care Manager  Transitions of Care  [facility] - Population Health  [phone]

## 2023-09-01 LAB — CULTURE, BLOOD (ROUTINE X 2)
Culture: NO GROWTH
Special Requests: ADEQUATE

## 2023-09-04 ENCOUNTER — Telehealth: Payer: Self-pay

## 2023-09-04 DIAGNOSIS — S0101XA Laceration without foreign body of scalp, initial encounter: Secondary | ICD-10-CM | POA: Diagnosis not present

## 2023-09-04 DIAGNOSIS — R6889 Other general symptoms and signs: Secondary | ICD-10-CM | POA: Diagnosis not present

## 2023-09-04 DIAGNOSIS — I6782 Cerebral ischemia: Secondary | ICD-10-CM | POA: Diagnosis not present

## 2023-09-04 DIAGNOSIS — M4802 Spinal stenosis, cervical region: Secondary | ICD-10-CM | POA: Diagnosis not present

## 2023-09-04 DIAGNOSIS — G319 Degenerative disease of nervous system, unspecified: Secondary | ICD-10-CM | POA: Diagnosis not present

## 2023-09-04 DIAGNOSIS — S0191XA Laceration without foreign body of unspecified part of head, initial encounter: Secondary | ICD-10-CM | POA: Diagnosis not present

## 2023-09-04 DIAGNOSIS — N3000 Acute cystitis without hematuria: Secondary | ICD-10-CM | POA: Diagnosis not present

## 2023-09-04 DIAGNOSIS — M47812 Spondylosis without myelopathy or radiculopathy, cervical region: Secondary | ICD-10-CM | POA: Diagnosis not present

## 2023-09-04 DIAGNOSIS — Z887 Allergy status to serum and vaccine status: Secondary | ICD-10-CM | POA: Diagnosis not present

## 2023-09-04 DIAGNOSIS — R220 Localized swelling, mass and lump, head: Secondary | ICD-10-CM | POA: Diagnosis not present

## 2023-09-04 DIAGNOSIS — E041 Nontoxic single thyroid nodule: Secondary | ICD-10-CM | POA: Diagnosis not present

## 2023-09-04 DIAGNOSIS — R531 Weakness: Secondary | ICD-10-CM | POA: Diagnosis not present

## 2023-09-04 DIAGNOSIS — R58 Hemorrhage, not elsewhere classified: Secondary | ICD-10-CM | POA: Diagnosis not present

## 2023-09-04 DIAGNOSIS — S0990XA Unspecified injury of head, initial encounter: Secondary | ICD-10-CM | POA: Diagnosis not present

## 2023-09-04 DIAGNOSIS — I4510 Unspecified right bundle-branch block: Secondary | ICD-10-CM | POA: Diagnosis not present

## 2023-09-04 DIAGNOSIS — M47816 Spondylosis without myelopathy or radiculopathy, lumbar region: Secondary | ICD-10-CM | POA: Diagnosis not present

## 2023-09-04 DIAGNOSIS — Z043 Encounter for examination and observation following other accident: Secondary | ICD-10-CM | POA: Diagnosis not present

## 2023-09-04 DIAGNOSIS — W01198A Fall on same level from slipping, tripping and stumbling with subsequent striking against other object, initial encounter: Secondary | ICD-10-CM | POA: Diagnosis not present

## 2023-09-04 DIAGNOSIS — Z880 Allergy status to penicillin: Secondary | ICD-10-CM | POA: Diagnosis not present

## 2023-09-04 DIAGNOSIS — W19XXXA Unspecified fall, initial encounter: Secondary | ICD-10-CM | POA: Diagnosis not present

## 2023-09-04 DIAGNOSIS — Z743 Need for continuous supervision: Secondary | ICD-10-CM | POA: Diagnosis not present

## 2023-09-04 NOTE — Patient Outreach (Signed)
  Care Management  Transitions of Care Program Transitions of Care Post-discharge week 2  09/04/2023 Name: Kristi Sawyer MRN: [id number] DOB: Mar 19, 1926  Subjective: Kristi Sawyer is a 89 y.o. year old female who is a primary care patient of Dettinger, Elige Radon, MD. The Care Management team was unable to reach the patient by phone to assess and address transitions of care needs.   Plan: Additional outreach attempts will be made to reach the patient enrolled in the [facility]  Program (Post Inpatient/ED Visit).  [name] RN, BSN, CCM RN Care Manager  Transitions of Care  [facility] - [facility]  [phone]

## 2023-09-05 ENCOUNTER — Telehealth: Payer: Self-pay | Admitting: Family Medicine

## 2023-09-05 ENCOUNTER — Inpatient Hospital Stay: Payer: Medicare Other

## 2023-09-06 DIAGNOSIS — I10 Essential (primary) hypertension: Secondary | ICD-10-CM | POA: Diagnosis not present

## 2023-09-06 DIAGNOSIS — N390 Urinary tract infection, site not specified: Secondary | ICD-10-CM | POA: Diagnosis not present

## 2023-09-06 DIAGNOSIS — Z9181 History of falling: Secondary | ICD-10-CM | POA: Diagnosis not present

## 2023-09-06 DIAGNOSIS — S2241XD Multiple fractures of ribs, right side, subsequent encounter for fracture with routine healing: Secondary | ICD-10-CM | POA: Diagnosis not present

## 2023-09-06 DIAGNOSIS — E7800 Pure hypercholesterolemia, unspecified: Secondary | ICD-10-CM | POA: Diagnosis not present

## 2023-09-07 NOTE — Consult Note (Signed)
[facility] Customer service manager [facility]) Accountable Care Organization (ACO) [facility] Liaison Note  09/07/2023  Kristi Sawyer 10/07/1926 [id number]  Location: [facility] RN Hospital Liaison screened the patient remotely at [facility].  Insurance: [employer]   Kristi Sawyer is a 89 y.o. female who is a Primary Care Patient of Dettinger, Elige Radon, MD-[place] [facility] Medicine. The patient was screened for 30 day readmission hospitalization with noted medium risk score for unplanned readmission risk with 2 IP/1 ED in 6 months.  The patient was assessed for potential [facility]) Care Management service needs for post hospital transition for care coordination. Review of patient's electronic medical record reveals patient admitted with Pneumonia on 08/26/23 d/c on 08/30/23 and returned to the ED 09/04/23 for acute cystitis. Will make a referral for care management services.  Plan: [facility] Liaison will continue to follow progress and disposition to asess for post hospital community care coordination/management needs.  Referral request for community care coordination:  Will make a referral for a nurse care coordinator for a post hospital prevention readmission follow up.   [facility] Care Management/Population Health does not replace or interfere with any arrangements made by the Inpatient Transition of Care team.   For questions contact:   Elliot Cousin, RN, [facility] Liaison [place]   [facility], Population Health Office Hours MTWF  8:00 am-6:00 pm Direct Dial: [phone] mobile [phone] [Office toll free line] Office Hours are M-F 8:30 - 5 pm [email or web address]

## 2023-09-10 ENCOUNTER — Telehealth: Payer: Self-pay

## 2023-09-10 DIAGNOSIS — E7800 Pure hypercholesterolemia, unspecified: Secondary | ICD-10-CM | POA: Diagnosis not present

## 2023-09-10 DIAGNOSIS — I10 Essential (primary) hypertension: Secondary | ICD-10-CM | POA: Diagnosis not present

## 2023-09-10 DIAGNOSIS — Z9181 History of falling: Secondary | ICD-10-CM | POA: Diagnosis not present

## 2023-09-10 DIAGNOSIS — N390 Urinary tract infection, site not specified: Secondary | ICD-10-CM | POA: Diagnosis not present

## 2023-09-10 DIAGNOSIS — S2241XD Multiple fractures of ribs, right side, subsequent encounter for fracture with routine healing: Secondary | ICD-10-CM | POA: Diagnosis not present

## 2023-09-10 NOTE — Telephone Encounter (Signed)
OV 09/11/23 w/ PCP

## 2023-09-10 NOTE — Patient Outreach (Addendum)
Received a referral from Elliot Cousin, [facility] Liaison, for RN care coordinator related. [name], RN has been in contact with the patient for TOC.    Iverson Alamin, Cephus Richer, [facility] Care Management Assistant [phone]

## 2023-09-10 NOTE — Transitions of Care (Post Inpatient/ED Visit) (Signed)
09/10/2023  Name: Kristi Sawyer MRN: [id number] DOB: [birth year]  Today's TOC FU Call Status: Today's TOC FU Call Status:: Successful TOC FU Call Completed TOC FU Call Complete Date: 09/10/23 Patient's Name and Date of Birth confirmed.  Transition Care Management Follow-up Telephone Call Date of Discharge: 09/04/23 Discharge Facility: Other ([facility]) Name of Other ([facility]) [facility]charge Facility: [facility] Type of Discharge: Emergency Department Primary Inpatient Discharge Diagnosis:: Fall, Laceration of head and UTI How have you been since you were released from the hospital?: Better (Per patients POA, Kristi, she is feeling better.) Any questions or concerns?: Yes Patient Questions/Concerns:: Kristi had questions about an FL2 and explained she is going to apply for Medicaid for her and at MD appt, request FL2 Patient Questions/Concerns Addressed: Other:  Items Reviewed: Did you receive and understand the discharge instructions provided?: Yes Medications obtained,verified, and reconciled?: Yes (Medications Reviewed) Any new allergies since your discharge?: No Dietary orders reviewed?: No Do you have support at home?: Yes (Very limited) People in Home: alone Name of Support/Comfort Primary Source: POA Kristi Sawyer  Medications Reviewed Today: Medications Reviewed Today     Reviewed by [name], RN (Case Manager) on 09/10/23 at 1619  Med List Status: <None>   Medication Order Taking? Sig Documenting Provider Last Dose Status Informant  acetaminophen (TYLENOL) 325 MG tablet [id number] Yes Take 2 tablets (650 mg total) by mouth every 6 (six) hours as needed for mild pain (or Fever >/= 101). Shon Hale, MD Taking Active Self, Pharmacy Records  amLODipine (NORVASC) 5 MG tablet [id number] Yes Take 1 tablet (5 mg total) by mouth daily. Dettinger, Elige Radon, MD Taking Active Self, Pharmacy Records           Med Note [name] Aug 27, 2023  9:41 AM)     azithromycin (ZITHROMAX) 500 MG tablet [id number] Yes Take 1 tablet (500 mg total) by mouth daily. [name], MD Taking Active   cefdinir (OMNICEF) 300 MG capsule [id number] No Take 1 capsule (300 mg total) by mouth every 12 (twelve) hours.  Patient not taking: Reported on 09/10/2023   [name], MD Not Taking Active            Med Note Electa Sniff [facility]   Mon Sep 10, 2023  4:17 PM) completed  Cholecalciferol (VITAMIN D) 2000 UNITS CAPS [id number] No Take by mouth as directed. Take 1 tablet daily except none on saturdays and sundays  Patient not taking: Reported on 08/27/2023   [provider] Not Taking Active Self, Pharmacy Records  [employer] VITAMIN C 500 MG tablet [id number] Yes TAKE 1 TABLET (500 MG TOTAL) BY MOUTH DAILY. Dettinger, Elige Radon, MD Taking Active Self, Pharmacy Records  Fe Fum-FePoly-Vit C-Vit B3 [facility]) 62.5-62.5-40-3 MG CAPS [id number] Yes Take 1 capsule by mouth daily. [provider] Taking Active Self, Pharmacy Records  GEMTESA 75 MG TABS [id number] Yes Take 1 tablet by mouth daily. [provider] Taking Active Self, Pharmacy Records  metoCLOPramide (REGLAN) 10 MG tablet [id number] Yes TAKE 1 TABLET BY MOUTH EVERYDAY AT BEDTIME [name], MD Taking Active Self, Pharmacy Records  Multiple Vitamins-Minerals (CENTRUM SILVER PO) [id number] Yes Take 1 tablet by mouth daily. [provider] Taking Active Self, Pharmacy Records  nitrofurantoin, macrocrystal-monohydrate, (MACROBID) 100 MG capsule [id number] Yes Take 100 mg by mouth 2 (two) times daily. [provider] Taking Active   Omega-3 Fatty Acids (FISH OIL) 1000 MG CAPS [id number] Yes Take 1 capsule by mouth 3 (  three) times daily.  [provider] Taking Active Self, Pharmacy Records  omeprazole (PRILOSEC) 20 MG capsule [id number] No PLEASE MAKE A FOLLOW UP APPOINTMENT. (TAKE1-2 CAPSULES BY MOUTH DAILY(20-40MG )  Patient not taking: Reported on 09/10/2023   [name], PA-C  Not Taking Active Self, Pharmacy Records           Med Note [facility], Illene Labrador   Fri Jul 27, 2023  4:29 PM) Pt is unsure if she is currently taking this.  potassium chloride SA (KLOR-CON M) 20 MEQ tablet [id number] Yes Take 1 tablet (20 mEq total) by mouth daily. [name], MD Taking Active   rosuvastatin (CRESTOR) 5 MG tablet [id number] Yes TAKE 1 TABLET BY MOUTH ON MONDAY, WEDNESDAY, AND FRIDAY Dettinger, Elige Radon, MD Taking Active Self, Pharmacy Records  zinc sulfate 220 (50 Zn) MG capsule [id number] Yes TAKE 1 CAPSULE BY MOUTH EVERY DAY Dettinger, Elige Radon, MD Taking Active Self, Pharmacy Records            Home Care and Equipment/Supplies: Were Home Health Services Ordered?: Yes [facility] Has Agency set up a time to come to your home?: Yes First Home Health Visit Date: 09/04/23 Any new equipment or medical supplies ordered?: No  Functional Questionnaire: Do you need assistance with bathing/showering or dressing?: No Do you need assistance with meal preparation?: No Do you have difficulty maintaining continence: No Do you need assistance with getting out of bed/getting out of a chair/moving?: No Do you have difficulty managing or taking your medications?: No  Follow up appointments reviewed: PCP Follow-up appointment confirmed?: Yes Date of PCP follow-up appointment?: 09/11/23 Follow-up Provider: Dr. Louanne Skye Specialist [facility] Follow-up appointment confirmed?: NA Do you need transportation to your follow-up appointment?: No Do you understand care options if your condition(s) worsen?: Yes-patient verbalized understanding  SDOH Interventions Today    Flowsheet Row Most Recent Value  SDOH Interventions   Food Insecurity Interventions Intervention Not Indicated  Housing Interventions Intervention Not Indicated  Transportation Interventions Intervention Not Indicated       Goals Addressed             This Visit's Progress    30 day TOC  Program       Current Barriers:  Knowledge Deficits related to plan of care for management of Urinary Tract infection and Hx of falls   RNCM Clinical Goal(s):  Patient will work with the Care Management team over the next 30 days to address Transition of Care Barriers: Medication access Medication Management Support at home Provider appointments Home Health services through collaboration with RN Care manager, provider, and care team.   Interventions: Evaluation of current treatment plan related to  self management and patient's adherence to plan as established by provider  Transitions of Care:  New goal. Doctor Visits  - discussed the importance of doctor visits Post discharge activity limitations prescribed by provider reviewed  Patient Goals/Self-Care Activities: Take all medications as prescribed Attend all scheduled provider appointments Call pharmacy for medication refills 3-7 days in advance of running out of medications        Follow up for #4 TOC 09/13/23@11am   [name] RN, BSN, CCM RN Care Manager  Transitions of Care  [place] [facility] [employer]  [phone]

## 2023-09-11 ENCOUNTER — Encounter: Payer: Self-pay | Admitting: Family Medicine

## 2023-09-11 ENCOUNTER — Ambulatory Visit (INDEPENDENT_AMBULATORY_CARE_PROVIDER_SITE_OTHER): Payer: Medicare Other | Admitting: Family Medicine

## 2023-09-11 VITALS — BP 135/73 | HR 76 | Ht 60.0 in | Wt 92.0 lb

## 2023-09-11 DIAGNOSIS — R5381 Other malaise: Secondary | ICD-10-CM | POA: Diagnosis not present

## 2023-09-11 DIAGNOSIS — R260 Ataxic gait: Secondary | ICD-10-CM | POA: Diagnosis not present

## 2023-09-11 DIAGNOSIS — R296 Repeated falls: Secondary | ICD-10-CM

## 2023-09-11 DIAGNOSIS — R531 Weakness: Secondary | ICD-10-CM

## 2023-09-11 DIAGNOSIS — N3000 Acute cystitis without hematuria: Secondary | ICD-10-CM

## 2023-09-11 DIAGNOSIS — J189 Pneumonia, unspecified organism: Secondary | ICD-10-CM

## 2023-09-11 NOTE — Progress Notes (Signed)
BP 135/73   Pulse 76   Ht 5' (1.524 m)   Wt 92 lb (41.7 kg)   SpO2 100%   BMI 17.97 kg/m    Subjective:   Patient ID: Kristi Sawyer, female    DOB: [birth year], 89 y.o.   MRN: [id number]  HPI: Kristi Sawyer is a 89 y.o. female presenting on 09/11/2023 for Hospitalization Follow-up, Medicare Wellness, and Urinary Tract Infection   HPI ER/hospital follow-up Patient was in the emergency department on 09/04/2023 and diagnosed with acute cystitis and a fall with a scalp laceration and head injury.  She was also in the hospital on 08/26/2023 and discharged on 08/30/2023 for a fall for which she pressed her life alert and EMS responded.  She has been having recurrent falls and is looking at an [facility] 2 for placement.  When she arrived she found to be tachypneic and tachycardic and leukocytosis and likely with pneumonia and was treated for pneumonia that time.  She has been having these issues with recurrent falls for some time and we tried to do in-home physical therapy and it does not seem to be improving.  She is here with family member and they are concerned because of this.  When she falls she gets lacerations or then she is sitting a lot and gets illnesses such as UTIs or pneumonias.  Relevant past medical, surgical, family and social history reviewed and updated as indicated. Interim medical history since our last visit reviewed. Allergies and medications reviewed and updated.  Review of Systems  Constitutional:  Negative for chills and fever.  HENT:  Negative for congestion, ear discharge and ear pain.   Eyes:  Negative for redness and visual disturbance.  Respiratory:  Negative for cough, chest tightness and shortness of breath.   Cardiovascular:  Negative for chest pain, palpitations and leg swelling.  Genitourinary:  Negative for difficulty urinating and dysuria.  Musculoskeletal:  Positive for gait problem.  Skin:  Negative for rash.  Neurological:  Positive for weakness. Negative for  dizziness, light-headedness and headaches.  Psychiatric/Behavioral:  Negative for agitation and behavioral problems.   All other systems reviewed and are negative.   Per HPI unless specifically indicated above   Allergies as of 09/11/2023       Reactions   Prevnar 13 [pneumococcal 13-val Conj Vacc] Hives   Red/streaking arm   Penicillins Hives   Tetanus Toxoids Other (See Comments)   Area of injection was red and streaked.   Codeine Nausea Only   Sulfonamide Derivatives Nausea Only        Medication List        Accurate as of September 11, 2023  4:32 PM. If you have any questions, ask your nurse or doctor.          STOP taking these medications    azithromycin 500 MG tablet Commonly known as: ZITHROMAX Stopped by: Elige Radon Hera Celaya   cefdinir 300 MG capsule Commonly known as: OMNICEF Stopped by: Elige Radon Emrik Erhard   nitrofurantoin (macrocrystal-monohydrate) 100 MG capsule Commonly known as: MACROBID Stopped by: Elige Radon Tyee Vandevoorde       TAKE these medications    acetaminophen 325 MG tablet Commonly known as: TYLENOL Take 2 tablets (650 mg total) by mouth every 6 (six) hours as needed for mild pain (or Fever >/= 101).   amLODipine 5 MG tablet Commonly known as: NORVASC Take 1 tablet (5 mg total) by mouth daily.   CENTRUM SILVER PO Take 1 tablet by  mouth daily.   [employer]S Vitamin C 500 MG tablet Generic drug: ascorbic acid TAKE 1 TABLET (500 MG TOTAL) BY MOUTH DAILY.   Fish Oil 1000 MG Caps Take 1 capsule by mouth 3 (three) times daily.   Gemtesa 75 MG Tabs Generic drug: Vibegron Take 1 tablet by mouth daily.   Integra 62.5-62.5-40-3 MG Caps Take 1 capsule by mouth daily.   metoCLOPramide 10 MG tablet Commonly known as: REGLAN TAKE 1 TABLET BY MOUTH EVERYDAY AT BEDTIME   omeprazole 20 MG capsule Commonly known as: PRILOSEC PLEASE MAKE A FOLLOW UP APPOINTMENT. (TAKE1-2 CAPSULES BY MOUTH DAILY(20-40MG )   potassium chloride SA 20 MEQ  tablet Commonly known as: KLOR-CON M Take 1 tablet (20 mEq total) by mouth daily.   rosuvastatin 5 MG tablet Commonly known as: CRESTOR TAKE 1 TABLET BY MOUTH ON MONDAY, WEDNESDAY, AND FRIDAY   Vitamin D 50 MCG (2000 UT) Caps Take by mouth as directed. Take 1 tablet daily except none on saturdays and sundays   zinc sulfate 220 (50 Zn) MG capsule TAKE 1 CAPSULE BY MOUTH EVERY DAY         Objective:   BP 135/73   Pulse 76   Ht 5' (1.524 m)   Wt 92 lb (41.7 kg)   SpO2 100%   BMI 17.97 kg/m   Wt Readings from Last 3 Encounters:  09/11/23 92 lb (41.7 kg)  08/30/23 92 lb 2.4 oz (41.8 kg)  08/09/23 94 lb (42.6 kg)    Physical Exam Vitals and nursing note reviewed.  Constitutional:      General: She is not in acute distress.    Appearance: She is well-developed. She is not diaphoretic.  Eyes:     Conjunctiva/sclera: Conjunctivae normal.  Cardiovascular:     Rate and Rhythm: Normal rate and regular rhythm.     Heart sounds: Normal heart sounds. No murmur heard. Pulmonary:     Effort: Pulmonary effort is normal. No respiratory distress.     Breath sounds: Normal breath sounds. No wheezing.  Musculoskeletal:        General: No swelling or tenderness. Normal range of motion.     Comments: 4 out of 5 strength in all 4 extremities  Skin:    General: Skin is warm and dry.     Findings: Laceration (Posterior right scalp laceration in a horizontal orientation about 4-1/2 cm long with 5 staples in it, healed well, remove staples) present. No erythema or rash.  Neurological:     Mental Status: She is alert and oriented to person, place, and time.     Coordination: Coordination normal.  Psychiatric:        Behavior: Behavior normal.     Removed 5 staples from posterior right scalp without issue.  Appears to be healed well  Assessment & Plan:   Problem List Items Addressed This Visit       Respiratory   CAP (community acquired pneumonia)   Relevant Orders   CBC with  Differential/Platelet   CMP14+EGFR   Urine Culture   Urinalysis, Complete     Genitourinary   UTI (urinary tract infection) - Primary   Relevant Orders   CBC with Differential/Platelet   CMP14+EGFR   Urine Culture   Urinalysis, Complete     Other   Ataxic gait   Relevant Orders   CBC with Differential/Platelet   CMP14+EGFR   Urine Culture   Urinalysis, Complete   Physical deconditioning   Relevant Orders   CBC with  Differential/Platelet   CMP14+EGFR   Urine Culture   Urinalysis, Complete   Generalized weakness   Relevant Orders   CBC with Differential/Platelet   CMP14+EGFR   Urine Culture   Urinalysis, Complete   Other Visit Diagnoses     Recurrent falls       Relevant Orders   CBC with Differential/Platelet   CMP14+EGFR   Urine Culture   Urinalysis, Complete       Working to try for placement for FL 2.  Home health physical therapy just has not been enough for her and she still continues to fall and has had scalp lacerations and other medical issues and has been in and out of the emergency department because of these falls.  [facility]  They also going to try for Medicaid to see if she gets approved but they think she draws too much to get it. Follow up plan: Return if symptoms worsen or fail to improve, for 1-2 month follow-up.  Counseling provided for all of the vaccine components Orders Placed This Encounter  Procedures   Urine Culture   CBC with Differential/Platelet   CMP14+EGFR   Urinalysis, Complete    Arville Care, MD [place] [facility] 09/11/2023, 4:32 PM

## 2023-09-11 NOTE — Telephone Encounter (Signed)
Pt has HFU with Dettinger today to discuss.

## 2023-09-11 NOTE — Telephone Encounter (Signed)
-----   Message from Elige Radon Dettinger sent at 09/11/2023  7:51 AM EDT ----- Regarding: FW: info for appt tomorrow I have signed the FL 2 and we can get it sent for them ----- Message ----- From: [name], RN Sent: 09/10/2023   4:21 PM EDT To: Elige Radon Dettinger, MD Subject: info for appt tomorrow                         Good afternoon, I spoke with Ms. Fiser's POA, [name] today.  She is aware patient needs help and they are applying for Medicaid for her.  She has also contacted [facility] and patient may go to LTC facility.  They need FL2. I wanted to give you a heads up. Thank you, Deb  [name] RN, BSN, CCM RN Care Manager  Transitions of Care  [facility] - [facility]  [phone]

## 2023-09-12 ENCOUNTER — Telehealth: Payer: Self-pay | Admitting: Family Medicine

## 2023-09-12 DIAGNOSIS — Z9181 History of falling: Secondary | ICD-10-CM | POA: Diagnosis not present

## 2023-09-12 DIAGNOSIS — E7800 Pure hypercholesterolemia, unspecified: Secondary | ICD-10-CM | POA: Diagnosis not present

## 2023-09-12 DIAGNOSIS — S2241XD Multiple fractures of ribs, right side, subsequent encounter for fracture with routine healing: Secondary | ICD-10-CM | POA: Diagnosis not present

## 2023-09-12 DIAGNOSIS — I10 Essential (primary) hypertension: Secondary | ICD-10-CM | POA: Diagnosis not present

## 2023-09-12 DIAGNOSIS — N390 Urinary tract infection, site not specified: Secondary | ICD-10-CM | POA: Diagnosis not present

## 2023-09-12 LAB — CMP14+EGFR
ALT: 16 [IU]/L (ref 0–32)
AST: 17 [IU]/L (ref 0–40)
Albumin: 4.3 g/dL (ref 3.6–4.6)
Alkaline Phosphatase: 82 [IU]/L (ref 44–121)
BUN/Creatinine Ratio: 13 (ref 12–28)
BUN: 7 mg/dL — ABNORMAL LOW (ref 10–36)
Bilirubin Total: 0.3 mg/dL (ref 0.0–1.2)
CO2: 25 mmol/L (ref 20–29)
Calcium: 9.8 mg/dL (ref 8.7–10.3)
Chloride: 94 mmol/L — ABNORMAL LOW (ref 96–106)
Creatinine, Ser: 0.53 mg/dL — ABNORMAL LOW (ref 0.57–1.00)
Globulin, Total: 2.1 g/dL (ref 1.5–4.5)
Glucose: 102 mg/dL — ABNORMAL HIGH (ref 70–99)
Potassium: 4.7 mmol/L (ref 3.5–5.2)
Sodium: 133 mmol/L — ABNORMAL LOW (ref 134–144)
Total Protein: 6.4 g/dL (ref 6.0–8.5)
eGFR: 86 mL/min/{1.73_m2} (ref >59)

## 2023-09-12 LAB — CBC WITH DIFFERENTIAL/PLATELET
Basophils Absolute: 0 10*3/uL (ref 0.0–0.2)
Basos: 0 %
EOS (ABSOLUTE): 0.1 10*3/uL (ref 0.0–0.4)
Eos: 1 %
Hematocrit: 39.4 % (ref 34.0–46.6)
Hemoglobin: 12.9 g/dL (ref 11.1–15.9)
Immature Grans (Abs): 0.1 10*3/uL (ref 0.0–0.1)
Immature Granulocytes: 1 %
Lymphocytes Absolute: 2.2 10*3/uL (ref 0.7–3.1)
Lymphs: 21 %
MCH: 31.6 pg (ref 26.6–33.0)
MCHC: 32.7 g/dL (ref 31.5–35.7)
MCV: 97 fL (ref 79–97)
Monocytes Absolute: 1 10*3/uL — ABNORMAL HIGH (ref 0.1–0.9)
Monocytes: 10 %
Neutrophils Absolute: 7 10*3/uL (ref 1.4–7.0)
Neutrophils: 67 %
Platelets: 324 10*3/uL (ref 150–450)
RBC: 4.08 x10E6/uL (ref 3.77–5.28)
RDW: 12.1 % (ref 11.7–15.4)
WBC: 10.4 10*3/uL (ref 3.4–10.8)

## 2023-09-12 LAB — URINALYSIS, COMPLETE
Bilirubin, UA: NEGATIVE
Glucose, UA: NEGATIVE
Leukocytes,UA: NEGATIVE
Nitrite, UA: NEGATIVE
RBC, UA: NEGATIVE
Specific Gravity, UA: 1.015 (ref 1.005–1.030)
Urobilinogen, Ur: 0.2 mg/dL (ref 0.2–1.0)
pH, UA: 6.5 (ref 5.0–7.5)

## 2023-09-12 NOTE — Telephone Encounter (Signed)
FL2 along w/ last 2 OV notes emailed.

## 2023-09-13 ENCOUNTER — Telehealth: Payer: Self-pay

## 2023-09-13 LAB — URINE CULTURE

## 2023-09-13 NOTE — Patient Outreach (Signed)
  Care Management  Transitions of Care Program Transitions of Care Post-discharge week 4  09/13/2023 Name: LUNAH AROYO MRN: [id number] DOB: 1926/06/07  Subjective: JOLETTA PERRIER is a 89 y.o. year old female who is a primary care patient of Dettinger, Elige Radon, MD. The Care Management team was unable to reach the patient by phone to assess and address transitions of care needs.  Patient seen by PCP yesterday and he is working on [facility] to assist with placement.    Plan: Additional outreach attempts will be made to reach the patient enrolled in the [facility] Program (Post Inpatient/ED Visit).  [name] RN, BSN, CCM RN Care Manager  Transitions of Care  [facility] - [facility]  [phone]

## 2023-09-13 NOTE — Telephone Encounter (Signed)
Aware resent FL2 by email [email or web address] & fax [phone] to [facility].

## 2023-09-17 ENCOUNTER — Other Ambulatory Visit: Payer: Self-pay | Admitting: Family Medicine

## 2023-09-17 ENCOUNTER — Other Ambulatory Visit: Payer: Self-pay | Admitting: Internal Medicine

## 2023-09-17 DIAGNOSIS — K219 Gastro-esophageal reflux disease without esophagitis: Secondary | ICD-10-CM

## 2023-09-18 DIAGNOSIS — I10 Essential (primary) hypertension: Secondary | ICD-10-CM | POA: Diagnosis not present

## 2023-09-18 DIAGNOSIS — S2241XD Multiple fractures of ribs, right side, subsequent encounter for fracture with routine healing: Secondary | ICD-10-CM | POA: Diagnosis not present

## 2023-09-18 DIAGNOSIS — I700 Atherosclerosis of aorta: Secondary | ICD-10-CM | POA: Diagnosis not present

## 2023-09-18 DIAGNOSIS — E7800 Pure hypercholesterolemia, unspecified: Secondary | ICD-10-CM | POA: Diagnosis not present

## 2023-09-18 DIAGNOSIS — I2510 Atherosclerotic heart disease of native coronary artery without angina pectoris: Secondary | ICD-10-CM | POA: Diagnosis not present

## 2023-09-20 ENCOUNTER — Ambulatory Visit: Payer: Self-pay

## 2023-09-20 ENCOUNTER — Other Ambulatory Visit: Payer: Self-pay | Admitting: Family Medicine

## 2023-09-20 DIAGNOSIS — I10 Essential (primary) hypertension: Secondary | ICD-10-CM | POA: Diagnosis not present

## 2023-09-20 DIAGNOSIS — I2510 Atherosclerotic heart disease of native coronary artery without angina pectoris: Secondary | ICD-10-CM | POA: Diagnosis not present

## 2023-09-20 DIAGNOSIS — I700 Atherosclerosis of aorta: Secondary | ICD-10-CM | POA: Diagnosis not present

## 2023-09-20 DIAGNOSIS — S2241XD Multiple fractures of ribs, right side, subsequent encounter for fracture with routine healing: Secondary | ICD-10-CM | POA: Diagnosis not present

## 2023-09-20 DIAGNOSIS — E7800 Pure hypercholesterolemia, unspecified: Secondary | ICD-10-CM | POA: Diagnosis not present

## 2023-09-20 NOTE — Transitions of Care (Post Inpatient/ED Visit) (Signed)
   09/20/2023  Name: Kristi Sawyer MRN: [id number] DOB: 03/01/1926  Successful outreach to patient this morning.  She notes she is feeling well and we discussed the status of her getting assistance at home.  She shared her POA Epifanio Lesches is working on it but to date nothing has changed.  Received permission from [name] to contact Epifanio Lesches to offer assistance.  Message left on her voicemail with return contact information provided.  Plan: Await return call from Epifanio Lesches.    [name] RN, BSN, CCM RN Care Manager  Transitions of Care  [facility] - [facility]  [phone]

## 2023-09-25 DIAGNOSIS — E7800 Pure hypercholesterolemia, unspecified: Secondary | ICD-10-CM | POA: Diagnosis not present

## 2023-09-25 DIAGNOSIS — I2510 Atherosclerotic heart disease of native coronary artery without angina pectoris: Secondary | ICD-10-CM | POA: Diagnosis not present

## 2023-09-25 DIAGNOSIS — I10 Essential (primary) hypertension: Secondary | ICD-10-CM | POA: Diagnosis not present

## 2023-09-25 DIAGNOSIS — S2241XD Multiple fractures of ribs, right side, subsequent encounter for fracture with routine healing: Secondary | ICD-10-CM | POA: Diagnosis not present

## 2023-09-25 DIAGNOSIS — I700 Atherosclerosis of aorta: Secondary | ICD-10-CM | POA: Diagnosis not present

## 2023-09-27 DIAGNOSIS — E7800 Pure hypercholesterolemia, unspecified: Secondary | ICD-10-CM | POA: Diagnosis not present

## 2023-09-27 DIAGNOSIS — I700 Atherosclerosis of aorta: Secondary | ICD-10-CM | POA: Diagnosis not present

## 2023-09-27 DIAGNOSIS — I10 Essential (primary) hypertension: Secondary | ICD-10-CM | POA: Diagnosis not present

## 2023-09-27 DIAGNOSIS — S2241XD Multiple fractures of ribs, right side, subsequent encounter for fracture with routine healing: Secondary | ICD-10-CM | POA: Diagnosis not present

## 2023-09-27 DIAGNOSIS — I2510 Atherosclerotic heart disease of native coronary artery without angina pectoris: Secondary | ICD-10-CM | POA: Diagnosis not present

## 2023-10-02 DIAGNOSIS — I10 Essential (primary) hypertension: Secondary | ICD-10-CM | POA: Diagnosis not present

## 2023-10-02 DIAGNOSIS — S2241XD Multiple fractures of ribs, right side, subsequent encounter for fracture with routine healing: Secondary | ICD-10-CM | POA: Diagnosis not present

## 2023-10-02 DIAGNOSIS — I700 Atherosclerosis of aorta: Secondary | ICD-10-CM | POA: Diagnosis not present

## 2023-10-02 DIAGNOSIS — E7800 Pure hypercholesterolemia, unspecified: Secondary | ICD-10-CM | POA: Diagnosis not present

## 2023-10-02 DIAGNOSIS — I2510 Atherosclerotic heart disease of native coronary artery without angina pectoris: Secondary | ICD-10-CM | POA: Diagnosis not present

## 2023-10-04 DIAGNOSIS — E7800 Pure hypercholesterolemia, unspecified: Secondary | ICD-10-CM | POA: Diagnosis not present

## 2023-10-04 DIAGNOSIS — I2510 Atherosclerotic heart disease of native coronary artery without angina pectoris: Secondary | ICD-10-CM | POA: Diagnosis not present

## 2023-10-04 DIAGNOSIS — I10 Essential (primary) hypertension: Secondary | ICD-10-CM | POA: Diagnosis not present

## 2023-10-04 DIAGNOSIS — S2241XD Multiple fractures of ribs, right side, subsequent encounter for fracture with routine healing: Secondary | ICD-10-CM | POA: Diagnosis not present

## 2023-10-04 DIAGNOSIS — I700 Atherosclerosis of aorta: Secondary | ICD-10-CM | POA: Diagnosis not present

## 2023-10-05 ENCOUNTER — Telehealth: Payer: Self-pay | Admitting: Family Medicine

## 2023-10-05 NOTE — Telephone Encounter (Signed)
Form updated and faxed to Kym Groom

## 2023-10-05 NOTE — Telephone Encounter (Signed)
Reason for CRM: FL2 form was completed but there are changes that need to be made. Representative; Kym Groom from [facility] social services requested this information and a call back [phone] ext [phone]

## 2023-10-05 NOTE — Telephone Encounter (Signed)
Copied from CRM [id number]. Topic: Clinical - Home Health Verbal Orders >> Oct 05, 2023  9:22 AM Orinda Kenner C wrote: Caller/Agency: Kristin Bruins [place] social services called regarding needing an order so they can start pt for Adult protective services FL2. It's an urgent matter, Inetta Fermo states she called many times. ATF is providing skills nursing for pt. Please call Inetta Fermo back at [phone], email: [email or web address]

## 2023-10-05 NOTE — Telephone Encounter (Signed)
Did they need any kind of other order or was it just the [facility] 2 that they needed.

## 2023-10-05 NOTE — Telephone Encounter (Signed)
FL2 sent to email & faxed as well to [phone]

## 2023-10-08 DIAGNOSIS — I2510 Atherosclerotic heart disease of native coronary artery without angina pectoris: Secondary | ICD-10-CM | POA: Diagnosis not present

## 2023-10-08 DIAGNOSIS — I10 Essential (primary) hypertension: Secondary | ICD-10-CM | POA: Diagnosis not present

## 2023-10-08 DIAGNOSIS — I700 Atherosclerosis of aorta: Secondary | ICD-10-CM | POA: Diagnosis not present

## 2023-10-08 DIAGNOSIS — E7800 Pure hypercholesterolemia, unspecified: Secondary | ICD-10-CM | POA: Diagnosis not present

## 2023-10-08 DIAGNOSIS — S2241XD Multiple fractures of ribs, right side, subsequent encounter for fracture with routine healing: Secondary | ICD-10-CM | POA: Diagnosis not present

## 2023-10-17 DIAGNOSIS — S2241XD Multiple fractures of ribs, right side, subsequent encounter for fracture with routine healing: Secondary | ICD-10-CM | POA: Diagnosis not present

## 2023-10-17 DIAGNOSIS — I2510 Atherosclerotic heart disease of native coronary artery without angina pectoris: Secondary | ICD-10-CM | POA: Diagnosis not present

## 2023-10-17 DIAGNOSIS — I700 Atherosclerosis of aorta: Secondary | ICD-10-CM | POA: Diagnosis not present

## 2023-10-17 DIAGNOSIS — E7800 Pure hypercholesterolemia, unspecified: Secondary | ICD-10-CM | POA: Diagnosis not present

## 2023-10-17 DIAGNOSIS — I10 Essential (primary) hypertension: Secondary | ICD-10-CM | POA: Diagnosis not present

## 2023-10-19 ENCOUNTER — Telehealth: Payer: Self-pay

## 2023-10-19 DIAGNOSIS — I10 Essential (primary) hypertension: Secondary | ICD-10-CM | POA: Diagnosis not present

## 2023-10-19 DIAGNOSIS — E7800 Pure hypercholesterolemia, unspecified: Secondary | ICD-10-CM | POA: Diagnosis not present

## 2023-10-19 DIAGNOSIS — I2510 Atherosclerotic heart disease of native coronary artery without angina pectoris: Secondary | ICD-10-CM | POA: Diagnosis not present

## 2023-10-19 DIAGNOSIS — S2241XD Multiple fractures of ribs, right side, subsequent encounter for fracture with routine healing: Secondary | ICD-10-CM | POA: Diagnosis not present

## 2023-10-19 DIAGNOSIS — I700 Atherosclerosis of aorta: Secondary | ICD-10-CM | POA: Diagnosis not present

## 2023-10-19 NOTE — Telephone Encounter (Signed)
Physical therapist from [facility] called to let us know that when he visited patient today she complained of a sore throat and congestion.  Her oxygen level was 95 and is normally 98.  I called patient and she said she is feeling okay, just has a cold with some congestion, drainage and sore throat. She is not having any fever or difficulty breathing.  Patient has an appointment with you next Wednesday, 10/24/23, and if things worsen before then she will either reach out to [place] or go to urgent care.

## 2023-10-23 ENCOUNTER — Ambulatory Visit: Payer: Medicare Other | Admitting: Physician Assistant

## 2023-10-24 ENCOUNTER — Ambulatory Visit: Payer: Medicare Other | Admitting: Family Medicine

## 2023-10-24 ENCOUNTER — Encounter: Payer: Self-pay | Admitting: Family Medicine

## 2023-10-24 DIAGNOSIS — I10 Essential (primary) hypertension: Secondary | ICD-10-CM | POA: Diagnosis not present

## 2023-10-24 DIAGNOSIS — I2510 Atherosclerotic heart disease of native coronary artery without angina pectoris: Secondary | ICD-10-CM | POA: Diagnosis not present

## 2023-10-24 DIAGNOSIS — S2241XD Multiple fractures of ribs, right side, subsequent encounter for fracture with routine healing: Secondary | ICD-10-CM | POA: Diagnosis not present

## 2023-10-24 DIAGNOSIS — I700 Atherosclerosis of aorta: Secondary | ICD-10-CM | POA: Diagnosis not present

## 2023-10-24 DIAGNOSIS — E7800 Pure hypercholesterolemia, unspecified: Secondary | ICD-10-CM | POA: Diagnosis not present

## 2023-10-26 DIAGNOSIS — S2241XD Multiple fractures of ribs, right side, subsequent encounter for fracture with routine healing: Secondary | ICD-10-CM | POA: Diagnosis not present

## 2023-10-26 DIAGNOSIS — I10 Essential (primary) hypertension: Secondary | ICD-10-CM | POA: Diagnosis not present

## 2023-10-26 DIAGNOSIS — I2510 Atherosclerotic heart disease of native coronary artery without angina pectoris: Secondary | ICD-10-CM | POA: Diagnosis not present

## 2023-10-26 DIAGNOSIS — I700 Atherosclerosis of aorta: Secondary | ICD-10-CM | POA: Diagnosis not present

## 2023-10-26 DIAGNOSIS — E7800 Pure hypercholesterolemia, unspecified: Secondary | ICD-10-CM | POA: Diagnosis not present

## 2023-10-31 DIAGNOSIS — I10 Essential (primary) hypertension: Secondary | ICD-10-CM | POA: Diagnosis not present

## 2023-10-31 DIAGNOSIS — I700 Atherosclerosis of aorta: Secondary | ICD-10-CM | POA: Diagnosis not present

## 2023-10-31 DIAGNOSIS — S2241XD Multiple fractures of ribs, right side, subsequent encounter for fracture with routine healing: Secondary | ICD-10-CM | POA: Diagnosis not present

## 2023-10-31 DIAGNOSIS — I2510 Atherosclerotic heart disease of native coronary artery without angina pectoris: Secondary | ICD-10-CM | POA: Diagnosis not present

## 2023-10-31 DIAGNOSIS — E7800 Pure hypercholesterolemia, unspecified: Secondary | ICD-10-CM | POA: Diagnosis not present

## 2023-12-11 ENCOUNTER — Telehealth: Payer: Self-pay

## 2023-12-11 NOTE — Telephone Encounter (Signed)
Musu from [facility] DSS called regarding patient's ability to care for herself - mental and physical capabilities of the patient.  Please call her back at [phone]

## 2023-12-14 NOTE — Telephone Encounter (Signed)
I have attempted to call back 4 times over the past 3 days and it goes to voicemail but her voice mailbox is full so I cannot leave a voicemail.  If she calls back let me know

## 2023-12-16 ENCOUNTER — Other Ambulatory Visit: Payer: Self-pay | Admitting: Physician Assistant

## 2023-12-16 ENCOUNTER — Observation Stay (HOSPITAL_COMMUNITY)
Admission: EM | Admit: 2023-12-16 | Discharge: 2023-12-17 | Disposition: A | Discharge status: Skilled Nursing Facility | Payer: Medicare Other | Attending: Internal Medicine | Admitting: Internal Medicine

## 2023-12-16 ENCOUNTER — Emergency Department (HOSPITAL_COMMUNITY): Payer: Medicare Other

## 2023-12-16 ENCOUNTER — Other Ambulatory Visit: Payer: Self-pay

## 2023-12-16 ENCOUNTER — Encounter (HOSPITAL_COMMUNITY): Payer: Self-pay | Admitting: Emergency Medicine

## 2023-12-16 DIAGNOSIS — R0781 Pleurodynia: Secondary | ICD-10-CM | POA: Diagnosis not present

## 2023-12-16 DIAGNOSIS — K900 Celiac disease: Secondary | ICD-10-CM | POA: Diagnosis not present

## 2023-12-16 DIAGNOSIS — E785 Hyperlipidemia, unspecified: Secondary | ICD-10-CM | POA: Diagnosis present

## 2023-12-16 DIAGNOSIS — R0689 Other abnormalities of breathing: Secondary | ICD-10-CM | POA: Diagnosis not present

## 2023-12-16 DIAGNOSIS — R5381 Other malaise: Secondary | ICD-10-CM | POA: Diagnosis present

## 2023-12-16 DIAGNOSIS — I10 Essential (primary) hypertension: Secondary | ICD-10-CM | POA: Diagnosis present

## 2023-12-16 DIAGNOSIS — R296 Repeated falls: Secondary | ICD-10-CM | POA: Diagnosis not present

## 2023-12-16 DIAGNOSIS — R260 Ataxic gait: Secondary | ICD-10-CM | POA: Diagnosis not present

## 2023-12-16 DIAGNOSIS — K219 Gastro-esophageal reflux disease without esophagitis: Secondary | ICD-10-CM

## 2023-12-16 DIAGNOSIS — F411 Generalized anxiety disorder: Secondary | ICD-10-CM | POA: Diagnosis present

## 2023-12-16 DIAGNOSIS — W19XXXA Unspecified fall, initial encounter: Principal | ICD-10-CM

## 2023-12-16 DIAGNOSIS — S2242XA Multiple fractures of ribs, left side, initial encounter for closed fracture: Secondary | ICD-10-CM

## 2023-12-16 DIAGNOSIS — Z79899 Other long term (current) drug therapy: Secondary | ICD-10-CM | POA: Insufficient documentation

## 2023-12-16 DIAGNOSIS — W010XXA Fall on same level from slipping, tripping and stumbling without subsequent striking against object, initial encounter: Secondary | ICD-10-CM | POA: Insufficient documentation

## 2023-12-16 DIAGNOSIS — N3281 Overactive bladder: Secondary | ICD-10-CM | POA: Diagnosis present

## 2023-12-16 DIAGNOSIS — Y92002 Bathroom of unspecified non-institutional (private) residence single-family (private) house as the place of occurrence of the external cause: Secondary | ICD-10-CM | POA: Diagnosis not present

## 2023-12-16 DIAGNOSIS — Z743 Need for continuous supervision: Secondary | ICD-10-CM | POA: Diagnosis not present

## 2023-12-16 DIAGNOSIS — R079 Chest pain, unspecified: Secondary | ICD-10-CM | POA: Diagnosis present

## 2023-12-16 DIAGNOSIS — I700 Atherosclerosis of aorta: Secondary | ICD-10-CM | POA: Diagnosis not present

## 2023-12-16 DIAGNOSIS — M810 Age-related osteoporosis without current pathological fracture: Secondary | ICD-10-CM | POA: Diagnosis not present

## 2023-12-16 DIAGNOSIS — I499 Cardiac arrhythmia, unspecified: Secondary | ICD-10-CM | POA: Diagnosis not present

## 2023-12-16 DIAGNOSIS — R6889 Other general symptoms and signs: Secondary | ICD-10-CM | POA: Diagnosis not present

## 2023-12-16 DIAGNOSIS — S2243XA Multiple fractures of ribs, bilateral, initial encounter for closed fracture: Secondary | ICD-10-CM | POA: Diagnosis not present

## 2023-12-16 HISTORY — DX: Multiple fractures of ribs, left side, initial encounter for closed fracture: S22.42XA

## 2023-12-16 LAB — CBC
HCT: 41.8 % (ref 36.0–46.0)
HCT: 42.6 % (ref 36.0–46.0)
Hemoglobin: 14.3 g/dL (ref 12.0–15.0)
Hemoglobin: 14.4 g/dL (ref 12.0–15.0)
MCH: 32 pg (ref 26.0–34.0)
MCH: 31.6 pg (ref 26.0–34.0)
MCHC: 34.2 g/dL (ref 30.0–36.0)
MCHC: 33.8 g/dL (ref 30.0–36.0)
MCV: 93.5 fL (ref 80.0–100.0)
MCV: 93.4 fL (ref 80.0–100.0)
Platelets: 160 10*3/uL (ref 150–400)
Platelets: 170 10*3/uL (ref 150–400)
RBC: 4.47 MIL/uL (ref 3.87–5.11)
RBC: 4.56 MIL/uL (ref 3.87–5.11)
RDW: 15 % (ref 11.5–15.5)
RDW: 15 % (ref 11.5–15.5)
WBC: 6.9 10*3/uL (ref 4.0–10.5)
WBC: 11.1 10*3/uL — ABNORMAL HIGH (ref 4.0–10.5)
nRBC: 0 % (ref 0.0–0.2)
nRBC: 0 % (ref 0.0–0.2)

## 2023-12-16 LAB — COMPREHENSIVE METABOLIC PANEL
ALT: 12 U/L (ref 0–44)
AST: 14 U/L — ABNORMAL LOW (ref 15–41)
Albumin: 3.8 g/dL (ref 3.5–5.0)
Alkaline Phosphatase: 32 U/L — ABNORMAL LOW (ref 38–126)
Anion gap: 10 (ref 5–15)
BUN: 11 mg/dL (ref 8–23)
CO2: 26 mmol/L (ref 22–32)
Calcium: 9.5 mg/dL (ref 8.9–10.3)
Chloride: 100 mmol/L (ref 98–111)
Creatinine, Ser: 0.54 mg/dL (ref 0.44–1.00)
GFR, Estimated: >60 mL/min (ref >60)
Glucose, Bld: 129 mg/dL — ABNORMAL HIGH (ref 70–99)
Potassium: 3.7 mmol/L (ref 3.5–5.1)
Sodium: 136 mmol/L (ref 135–145)
Total Bilirubin: 0.8 mg/dL (ref 0.0–1.2)
Total Protein: 6.1 g/dL — ABNORMAL LOW (ref 6.5–8.1)

## 2023-12-16 LAB — URINALYSIS, ROUTINE W REFLEX MICROSCOPIC
Bacteria, UA: NONE SEEN
Bilirubin Urine: NEGATIVE
Glucose, UA: NEGATIVE mg/dL
Ketones, ur: 5 mg/dL — AB
Leukocytes,Ua: NEGATIVE
Nitrite: NEGATIVE
Protein, ur: 100 mg/dL — AB
Specific Gravity, Urine: 1.014 (ref 1.005–1.030)
pH: 6 (ref 5.0–8.0)

## 2023-12-16 LAB — CREATININE, SERUM
Creatinine, Ser: 0.5 mg/dL (ref 0.44–1.00)
GFR, Estimated: >60 mL/min (ref >60)

## 2023-12-16 MED ORDER — AMLODIPINE BESYLATE 5 MG PO TABS
5.0000 mg | ORAL_TABLET | Freq: Every day | ORAL | Status: DC
Start: 2023-12-16 — End: 2023-12-17
  Administered 2023-12-16 – 2023-12-17 (×2): 5 mg via ORAL
  Filled 2023-12-16 (×2): qty 1

## 2023-12-16 MED ORDER — METHOCARBAMOL 500 MG PO TABS: 500.0000 mg | ORAL_TABLET | Freq: Three times a day (TID) | ORAL | Status: DC | PRN

## 2023-12-16 MED ORDER — OXYCODONE HCL 5 MG PO TABS: 5.0000 mg | ORAL_TABLET | ORAL | Status: DC | PRN

## 2023-12-16 MED ORDER — ONDANSETRON HCL 4 MG PO TABS: 4.0000 mg | ORAL_TABLET | Freq: Four times a day (QID) | ORAL | Status: DC | PRN

## 2023-12-16 MED ORDER — LIDOCAINE 5 % EX PTCH
1.0000 | MEDICATED_PATCH | CUTANEOUS | Status: DC
  Administered 2023-12-16 – 2023-12-17 (×2): 1 via TRANSDERMAL
  Filled 2023-12-16 (×2): qty 1

## 2023-12-16 MED ORDER — ACETAMINOPHEN 325 MG PO TABS
650.0000 mg | ORAL_TABLET | Freq: Four times a day (QID) | ORAL | Status: DC
  Administered 2023-12-16 – 2023-12-17 (×6): 650 mg via ORAL
  Filled 2023-12-16 (×5): qty 2

## 2023-12-16 MED ORDER — OXYCODONE-ACETAMINOPHEN 5-325 MG PO TABS
1.0000 | ORAL_TABLET | Freq: Once | ORAL | Status: AC
  Administered 2023-12-16: 1 via ORAL
  Filled 2023-12-16: qty 1

## 2023-12-16 MED ORDER — KETOROLAC TROMETHAMINE 60 MG/2ML IM SOLN: 30.0000 mg | Freq: Once | INTRAMUSCULAR | Status: DC

## 2023-12-16 MED ORDER — FENTANYL CITRATE PF 50 MCG/ML IJ SOSY: 12.5000 ug | PREFILLED_SYRINGE | INTRAMUSCULAR | Status: DC | PRN

## 2023-12-16 MED ORDER — METHOCARBAMOL 500 MG PO TABS
500.0000 mg | ORAL_TABLET | Freq: Three times a day (TID) | ORAL | Status: DC
  Administered 2023-12-16: 500 mg via ORAL
  Filled 2023-12-16: qty 1

## 2023-12-16 MED ORDER — DOCUSATE SODIUM 100 MG PO CAPS
100.0000 mg | ORAL_CAPSULE | Freq: Two times a day (BID) | ORAL | Status: DC
  Administered 2023-12-16 – 2023-12-17 (×3): 100 mg via ORAL
  Filled 2023-12-16 (×3): qty 1

## 2023-12-16 MED ORDER — ONDANSETRON HCL 4 MG/2ML IJ SOLN: 4.0000 mg | Freq: Four times a day (QID) | INTRAMUSCULAR | Status: DC | PRN

## 2023-12-16 MED ORDER — KETOROLAC TROMETHAMINE 15 MG/ML IJ SOLN
15.0000 mg | Freq: Once | INTRAMUSCULAR | Status: AC
  Administered 2023-12-16: 15 mg via INTRAVENOUS
  Filled 2023-12-16: qty 1

## 2023-12-16 MED ORDER — ENOXAPARIN SODIUM 30 MG/0.3ML IJ SOSY
30.0000 mg | PREFILLED_SYRINGE | INTRAMUSCULAR | Status: DC
Start: 2023-12-17 — End: 2023-12-17
  Administered 2023-12-17: 30 mg via SUBCUTANEOUS
  Filled 2023-12-16: qty 0.3

## 2023-12-16 MED ORDER — TRAZODONE HCL 50 MG PO TABS: 25.0000 mg | ORAL_TABLET | Freq: Every evening | ORAL | Status: DC | PRN

## 2023-12-16 NOTE — ED Notes (Signed)
Pt ambulated with walker to more than half way to the restroom. Then she started to feel unsteady. Used a wheelchair the rest of the way.   Pt transported to CT after restroom

## 2023-12-16 NOTE — Plan of Care (Signed)
  Problem: Acute Rehab PT Goals(only PT should resolve) Goal: Patient Will Transfer Sit To/From Stand Flowsheets (Taken 12/16/2023 [phone]) Patient will transfer sit to/from stand: Independently Goal: Pt Will Transfer Bed To Chair/Chair To Bed Flowsheets (Taken 12/16/2023 0959) Pt will Transfer Bed to Chair/Chair to Bed: Independently Goal: Pt Will Perform Standing Balance Or Pre-Gait Flowsheets (Taken 12/16/2023 0959) Pt will perform standing balance or pre-gait: Independently Goal: Pt Will Ambulate Flowsheets (Taken 12/16/2023 0959) Pt will Ambulate:  75 feet  with modified independence  with least restrictive assistive device  Nelida Meuse PT, DPT [facility] [place] [phone] office

## 2023-12-16 NOTE — H&P (Signed)
History and Physical  [facility]  Kristi Sawyer WUJ:[id number] DOB: 06/06/1926 DOA: 12/16/2023  PCP: Dettinger, Elige Radon, MD  Patient coming from: Home by [facility] Level of care: Med-Surg  I have personally briefly reviewed patient's old medical records in [facility] Link  Chief Complaint: Fall   HPI: Kristi Sawyer is a 89 year old female with osteoporosis, lives alone, falls frequently, history of hypertension, GERD, GAD, hyperlipidemia reportedly tripped while in the bathroom early this morning hitting her left rib cage on that tub.  She has been complaining of severe 8/10 left-sided rib cage pain.  Patient reportedly did not hit her head.  In the ED she was evaluated and sent for CT chest without contrast with findings of acute nondisplaced fractures of the posterior lateral left ribs 9 through 12.  Her metabolic workup was unremarkable.  Her urinalysis was negative.  She is being admitted for pain management and PT evaluation.    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 benign tumor      reports that she has never smoked. She has never used smokeless tobacco. She reports that she does not drink alcohol and does not use  drugs.  Allergies  Allergen Reactions   Prevnar 13 [Pneumococcal 13-Val Conj Vacc] Hives    Red/streaking arm   Penicillins Hives   Tetanus Toxoids Other (See Comments)    Area of injection was red and streaked.   Codeine Nausea Only   Sulfonamide Derivatives Nausea Only    Family History  Problem Relation Age of Onset   Healthy Mother    Lung cancer Father    Colon cancer Neg Hx    Stomach cancer Neg Hx    Esophageal cancer Neg Hx     Prior to Admission medications   Medication Sig Start Date End Date Taking? Authorizing Provider  acetaminophen (TYLENOL) 325 MG tablet Take 2 tablets (650 mg total) by mouth every 6 (six) hours as needed for mild pain (or Fever >/= 101). 12/20/21   Shon Hale, MD  amLODipine (NORVASC) 5 MG tablet Take 1 tablet (5 mg total) by mouth daily. 04/18/23   Dettinger, Elige Radon, MD  Cholecalciferol (VITAMIN D) 2000 UNITS CAPS Take by mouth as directed. Take 1 tablet daily except none on saturdays and sundays    [provider]  CVS VITAMIN C 500 MG tablet TAKE 1 TABLET (500 MG TOTAL) BY MOUTH DAILY. 07/30/23   Dettinger, Elige Radon,  MD  Fe Fum-FePoly-Vit C-Vit B3 (INTEGRA) 62.5-62.5-40-3 MG CAPS TAKE 1 CAPSULE BY MOUTH EVERY DAY 09/17/23   [name], MD  GEMTESA 75 MG TABS Take 1 tablet by mouth daily. 07/14/22   [provider]  metoCLOPramide (REGLAN) 10 MG tablet TAKE 1 TABLET BY MOUTH EVERYDAY AT BEDTIME 09/17/23   [name], MD  Multiple Vitamins-Minerals (CENTRUM SILVER PO) Take 1 tablet by mouth daily.    [provider]  Omega-3 Fatty Acids (FISH OIL) 1000 MG CAPS Take 1 capsule by mouth 3 (three) times daily.     [provider]  omeprazole (PRILOSEC) 20 MG capsule PLEASE MAKE A FOLLOW UP APPOINTMENT. (TAKE1-2 CAPSULES BY MOUTH DAILY(20-40MG ) 04/16/23   Quentin Mulling R, PA-C  potassium chloride SA (KLOR-CON M) 20 MEQ tablet Take 1 tablet (20 mEq total) by mouth daily. 08/31/23   [name], MD  rosuvastatin  (CRESTOR) 5 MG tablet TAKE 1 TABLET BY MOUTH ON MONDAY, [facility], AND FRIDAY 04/18/23   Dettinger, Elige Radon, MD  zinc sulfate 220 (50 Zn) MG capsule TAKE 1 CAPSULE BY MOUTH EVERY DAY 09/17/23   Dettinger, Elige Radon, MD    Physical Exam: Vitals:   12/16/23 0300 12/16/23 0500 12/16/23 0530 12/16/23 0600  BP: (!) 154/86 (!) 166/71 (!) 154/70 (!) 155/72  Pulse: 77 64 64 72  Resp: 17 18 17 18   Temp:      TempSrc:      SpO2: 92% 95% 94% 94%  Weight:      Height:        Constitutional: elderly frail female, NAD, calm, Uncomfortable Eyes: PERRL, lids and conjunctivae normal ENMT: Mucous membranes are moist. Posterior pharynx clear of any exudate or lesions.Normal dentition.  Neck: normal, supple, no masses, no thyromegaly Respiratory: shallow breathing due to pain; no accessory muscle use seen.   Cardiovascular: normal s1, s2 sounds, no murmurs / rubs / gallops. No extremity edema. 2+ pedal pulses. No carotid bruits.  Abdomen: no tenderness, no masses palpated. No hepatosplenomegaly. Bowel sounds positive.  Musculoskeletal: no clubbing / cyanosis. No joint deformity upper and lower extremities. Good ROM, no contractures. Normal muscle tone.  Skin: no rashes, lesions, ulcers. No induration Neurologic: CN 2-12 grossly intact. Sensation intact, DTR normal. Strength 5/5 in all 4.  Psychiatric: Normal judgment and insight. Alert and oriented x 3. Normal mood.   Labs on Admission: I have personally reviewed following labs and imaging studies  CBC: Recent Labs  Lab 12/16/23 0441  WBC 6.9  HGB 14.3  HCT 41.8  MCV 93.5  PLT 160   Basic Metabolic Panel: Recent Labs  Lab 12/16/23 0441  NA 136  K 3.7  CL 100  CO2 26  GLUCOSE 129*  BUN 11  CREATININE 0.54  CALCIUM 9.5   GFR: Estimated Creatinine Clearance: 27.9 mL/min (by C-G formula based on SCr of 0.54 mg/dL). Liver Function Tests: Recent Labs  Lab 12/16/23 0441  AST 14*  ALT 12  ALKPHOS 32*  BILITOT 0.8  PROT 6.1*  ALBUMIN  3.8   No results for input(s): "LIPASE", "AMYLASE" in the last 168 hours. No results for input(s): "AMMONIA" in the last 168 hours. Coagulation Profile: No results for input(s): "INR", "PROTIME" in the last 168 hours. Cardiac Enzymes: No results for input(s): "CKTOTAL", "CKMB", "CKMBINDEX", "TROPONINI" in the last 168 hours. BNP (last 3 results) No results for input(s): "PROBNP" in the last 8760 hours. HbA1C: No results for input(s): "HGBA1C" in the last 72 hours. CBG: No  results for input(s): "GLUCAP" in the last 168 hours. Lipid Profile: No results for input(s): "CHOL", "HDL", "LDLCALC", "TRIG", "CHOLHDL", "LDLDIRECT" in the last 72 hours. Thyroid Function Tests: No results for input(s): "TSH", "T4TOTAL", "FREET4", "T3FREE", "THYROIDAB" in the last 72 hours. Anemia Panel: No results for input(s): "VITAMINB12", "FOLATE", "FERRITIN", "TIBC", "IRON", "RETICCTPCT" in the last 72 hours. Urine analysis:    Component Value Date/Time   COLORURINE YELLOW 12/16/2023 0440   APPEARANCEUR CLEAR 12/16/2023 0440   APPEARANCEUR Clear 09/11/2023 1641   LABSPEC 1.014 12/16/2023 0440   PHURINE 6.0 12/16/2023 0440   GLUCOSEU NEGATIVE 12/16/2023 0440   HGBUR SMALL (A) 12/16/2023 0440   BILIRUBINUR NEGATIVE 12/16/2023 0440   BILIRUBINUR Negative 09/11/2023 1641   KETONESUR 5 (A) 12/16/2023 0440   PROTEINUR 100 (A) 12/16/2023 0440   UROBILINOGEN negative 07/07/2014 1033   NITRITE NEGATIVE 12/16/2023 0440   LEUKOCYTESUR NEGATIVE 12/16/2023 0440    Radiological Exams on Admission: CT Chest Wo Contrast Result Date: 12/16/2023 CLINICAL DATA:  Patient tripped and fell hitting left rib area on tub. Left rib pain. EXAM: CT CHEST WITHOUT CONTRAST TECHNIQUE: Multidetector CT imaging of the chest was performed following the standard protocol without IV contrast. RADIATION DOSE REDUCTION: This exam was performed according to the departmental dose-optimization program which includes automated exposure  control, adjustment of the mA and/or kV according to patient size and/or use of iterative reconstruction technique. COMPARISON:  CTA chest 08/27/2023 FINDINGS: Cardiovascular: No pericardial effusion. Coronary artery and aortic atherosclerotic calcification. Mediastinum/Nodes: Trachea and esophagus are unremarkable. No thoracic adenopathy. Lungs/Pleura: Scattered centrilobular micro nodules with clustered micro nodules in the right upper lobe. Bibasilar atelectasis/scarring. Mild diffuse bronchial wall thickening. No pleural effusion or pneumothorax. Upper Abdomen: No acute abnormality. Musculoskeletal: Acute nondisplaced fractures of the left posterolateral 9th-12th ribs. Multiple additional chronic bilateral right rib fractures. IMPRESSION: 1. Acute nondisplaced fractures of the left posterolateral 9th-12th ribs. No pneumothorax. 2. Scattered centrilobular micro nodules with clustered micro nodules in the right upper lobe, likely infectious/inflammatory. 3.  Aortic Atherosclerosis (ICD10-I70.0). Electronically Signed   By: Minerva Fester M.D.   On: 12/16/2023 04:06    Assessment/Plan Principal Problem:   Closed traumatic nondisplaced fracture of four ribs of left side, initial encounter Active Problems:   Hyperlipidemia   ANXIETY   GERD (gastroesophageal reflux disease)   CELIAC SPRUE   Overactive bladder   Hypertension   Osteoporosis without current pathological fracture   Ataxic gait   Physical deconditioning   Falls   Debility    Fall at home  Acute nondisplaced left rib fractures 9-12  Uncontrolled pain - Pt presented after acute fall at home - admit for pain management - schedule acetaminophen - oxycodone ordered for moderate pain, fentanyl for severe pain - stool softeners ordered - fall precautions ordered - PT evaluation requested  - TOC consultation requested for placement   DVT prophylaxis: enoxaparin   Code Status: DNR   Family Communication:   Disposition Plan:  anticipating SNF   Consults called: PT    Level of care: Med-Surg Standley Dakins MD [facility] How to contact the [facility] Attending or Consulting provider 7A - 7P or covering provider during after hours 7P -7A, for this patient?  Check the care team in [facility] and look for a) attending/consulting [facility] provider listed and b) the [facility] team listed Log into [email or web address] and use [place]'s universal password to access. If you do not have the password, please contact the hospital operator. Locate the [facility] provider you are  looking for under [facility] and page to a number that you can be directly reached. If you still have difficulty reaching the provider, please page the [facility] (Director on Call) for the Hospitalists listed on amion for assistance.   If 7PM-7AM, please contact night-coverage [email or web address] Password [facility]  12/16/2023, 7:34 AM

## 2023-12-16 NOTE — [place] FL2 (Signed)
[place] MEDICAID FL2 LEVEL OF CARE FORM     IDENTIFICATION  Patient Name: LORALI KHAMIS Birthdate: [birth year] Sex: female Admission Date (Current Location): 12/16/2023  [facility] and [place] Number:  [employer] and Address:  [facility],  [address], [place] [address]      Provider Number: [id number]  Attending Physician Name and Address:  Cleora Fleet, MD  Relative Name and Phone Number:       Current Level of Care: Hospital Recommended Level of Care: Skilled Nursing Facility Prior Approval Number:    Date Approved/Denied:   PASRR Number: [id number]  Discharge Plan: SNF    Current Diagnoses: Patient Active Problem List   Diagnosis Date Noted   Closed traumatic nondisplaced fracture of four ribs of left side, initial encounter 12/16/2023   Sepsis due to undetermined organism (HCC) 08/30/2023   Fall at home, initial encounter 08/27/2023   CAP (community acquired pneumonia) 08/27/2023   SIRS (systemic inflammatory response syndrome) (HCC) 08/27/2023   Hyponatremia 08/27/2023   Generalized weakness 08/27/2023   Hypokalemia 07/27/2023   Debility 07/26/2023   Falls 12/19/2021   Ataxic gait    UTI (urinary tract infection)    Physical deconditioning    Osteoporosis without current pathological fracture 11/23/2020   Hypertension 10/28/2017   Overactive bladder 06/18/2015   Hyperlipidemia 09/26/2010   ANXIETY 09/26/2010   GERD (gastroesophageal reflux disease) 09/26/2010   CELIAC SPRUE 09/26/2010    Orientation RESPIRATION BLADDER Height & Weight     Self, Time, Situation, Place  Normal Incontinent Weight: 81 lb 12.7 oz (37.1 kg) Height:  5' (152.4 cm)  BEHAVIORAL SYMPTOMS/MOOD NEUROLOGICAL BOWEL NUTRITION STATUS      Incontinent Diet (See d/c summary)  AMBULATORY STATUS COMMUNICATION OF NEEDS Skin   Extensive Assist Verbally Bruising                       Personal Care Assistance Level of Assistance  Feeding,  Bathing, Dressing Bathing Assistance: Maximum assistance Feeding assistance: Limited assistance Dressing Assistance: Maximum assistance     Functional Limitations Info  Sight, Hearing, Speech Sight Info: Impaired Hearing Info: Adequate Speech Info: Adequate    SPECIAL CARE FACTORS FREQUENCY  PT (By licensed PT)     PT Frequency: 5x weekly              Contractures      Additional Factors Info  Code Status, Allergies Code Status Info: DNR- Limited Allergies Info: Prevnar 13 (pneumococcal 130val Conj Vacc), Penicillins, Tetanus Toxoids, Codeine, Sulfonamide Derivatives           Current Medications (12/16/2023):  This is the current hospital active medication list Current Facility-Administered Medications  Medication Dose Route Frequency Provider Last Rate Last Admin   acetaminophen (TYLENOL) tablet 650 mg  650 mg Oral Q6H Johnson, Clanford L, MD   650 mg at 12/16/23 1017   amLODipine (NORVASC) tablet 5 mg  5 mg Oral Daily Johnson, Clanford L, MD   5 mg at 12/16/23 1143   docusate sodium (COLACE) capsule 100 mg  100 mg Oral BID Johnson, Clanford L, MD   100 mg at 12/16/23 1017   [START ON 12/17/2023] enoxaparin (LOVENOX) injection 30 mg  30 mg Subcutaneous Q24H Johnson, Clanford L, MD       fentaNYL (SUBLIMAZE) injection 12.5 mcg  12.5 mcg Intravenous Q2H PRN Johnson, Clanford L, MD       lidocaine (LIDODERM) 5 % 1 patch  1 patch  Transdermal Q24H Cleora Fleet, MD   1 patch at 12/16/23 1610   methocarbamol (ROBAXIN) tablet 500 mg  500 mg Oral Q8H PRN Johnson, Clanford L, MD       ondansetron (ZOFRAN) tablet 4 mg  4 mg Oral Q6H PRN Johnson, Clanford L, MD       Or   ondansetron (ZOFRAN) injection 4 mg  4 mg Intravenous Q6H PRN Johnson, Clanford L, MD       oxyCODONE (Oxy IR/ROXICODONE) immediate release tablet 5 mg  5 mg Oral Q4H PRN Johnson, Clanford L, MD       traZODone (DESYREL) tablet 25 mg  25 mg Oral QHS PRN Cleora Fleet, MD         Discharge  Medications: Please see discharge summary for a list of discharge medications.  Relevant Imaging Results:  Relevant Lab Results:   Additional Information SSN: [id number]  Karn Cassis, LCSW

## 2023-12-16 NOTE — Evaluation (Signed)
Physical Therapy Evaluation Patient Details Name: Kristi Sawyer MRN: [id number] DOB: [birth year] Today's Date: 12/16/2023  History of Present Illness  Patient presents after fall.  Medical history includes HLD, GERD, HTN, osteoporosis, anxiety, anemia.  Patient describes a mechanical fall while in the bathroom, caused by stumbling over her walker in the toilet.  She struck her left chest wall on the tub.  She has since had pain in this area.  She denies striking her head.  She received 50 mcg of fentanyl prior to arrival.  Current pain is 5/10 in severity.  Clinical Impression   Pt tolerated today's Physical Therapy Evaluation, well, reporting no pain and great return for bed mobility. Upon standing, progressive assistance needed for transfers, with reported weakness in BLE. Pt started sliding in standing with posterior lean with feet sliding anterior in standing. Pt sat back down and perform total assist stand-pivot transfer to WC. Pt lives alone and is independent with all mobility and home-bound ADLs. Assistance for iADLs and driving given by friends and family. Pt demonstrating limitations in functional mobility, ADLs, and ambulatino due to muscle weakness and balance deficits. Based upon these deficits/impairments, patient will benefit from continued skilled physical therapy services during remainder of hospital stay and at the next recommended venue of care to address deficits and promote return to optimal function.                If plan is discharge home, recommend the following: A lot of help with walking and/or transfers;A lot of help with bathing/dressing/bathroom   Can travel by private vehicle   No    Equipment Recommendations None recommended by PT  Recommendations for Other Services       Functional Status Assessment Patient has had a recent decline in their functional status and demonstrates the ability to make significant improvements in function in a reasonable and  predictable amount of time.     Precautions / Restrictions Precautions Precautions: None Restrictions Weight Bearing Restrictions Per Provider Order: No      Mobility  Bed Mobility Overal bed mobility: Modified Independent             General bed mobility comments: power to sitting from supine position with bedrail.    Transfers Overall transfer level: Needs assistance Equipment used: Rolling walker (2 wheels) Transfers: Sit to/from Stand, Bed to chair/wheelchair/BSC Sit to Stand: Mod assist, Min assist Stand pivot transfers: Total assist         General transfer comment: Pt performing 2-3 sit/stands from EOB which progressively given more assist each attempt. EAch time pt's BLE sliding and pt reporting, "I just feel like legs are going to give out." Initially attempted walking to [facility], however pt unable and terminated due to weakness in BLE. Standpivot to [facility] for transfer from ED. total assist provided as pt did not try to weight bear through BLE.    Ambulation/Gait               General Gait Details: terminated due to progressive assistance provided and fatigue reported in BLE.  Stairs            Wheelchair Mobility     Tilt Bed    Modified Rankin (Stroke Patients Only)       Balance Overall balance assessment: Needs assistance Sitting-balance support: No upper extremity supported, Feet unsupported Sitting balance-Leahy Scale: Good Sitting balance - Comments: sitting EOB   Standing balance support: During functional activity, Bilateral upper extremity supported, Reliant on assistive  device for balance Standing balance-Leahy Scale: Fair Standing balance comment: in standing with RW.                             Pertinent Vitals/Pain Pain Assessment Pain Assessment: No/denies pain    Home Living Family/patient expects to be discharged to:: Private residence Living Arrangements: Alone Available Help at Discharge:  Friend(s);Available PRN/intermittently Type of Home: House Home Access: Stairs to enter Entrance Stairs-Rails: Right;Left;Can reach both Entrance Stairs-Number of Steps: 2-3   Home Layout: One level Home Equipment: BSC/3in1;Cane - single point;Rolling Walker (2 wheels);Shower seat;Grab bars - tub/shower;Toilet riser Additional Comments: Has life alert system    Prior Function Prior Level of Function : Independent/Modified Independent             Mobility Comments: Household and short distanced community ambulator using RW ADLs Comments: Independent house hold ADL's, assisted for community ADLs by neighbors, church members, does not drive     Extremity/Trunk Assessment   Upper Extremity Assessment Upper Extremity Assessment: Defer to OT evaluation    Lower Extremity Assessment Lower Extremity Assessment: Generalized weakness;RLE deficits/detail;LLE deficits/detail RLE Deficits / Details: 3+/5 knee extension and ankle dorsiflexion RLE Sensation: WNL LLE Deficits / Details: 3+/5 knee extension and ankle dorsiflexion LLE Sensation: WNL    Cervical / Trunk Assessment Cervical / Trunk Assessment: Kyphotic  Communication   Communication Communication: No apparent difficulties  Cognition Arousal: Alert Behavior During Therapy: WFL for tasks assessed/performed Overall Cognitive Status: Within Functional Limits for tasks assessed                                 General Comments: A&O x 4        General Comments      Exercises     Assessment/Plan    PT Assessment Patient needs continued PT services  PT Problem List Decreased strength;Decreased activity tolerance;Decreased balance;Decreased mobility       PT Treatment Interventions DME instruction;Stair training;Functional mobility training;Gait training;Therapeutic activities;Therapeutic exercise;Balance training;Neuromuscular re-education    PT Goals (Current goals can be found in the Care Plan  section)  Acute Rehab PT Goals Patient Stated Goal: return home after rehab PT Goal Formulation: With patient Time For Goal Achievement: 12/30/23 Potential to Achieve Goals: Good    Frequency Min 3X/week     Co-evaluation               AM-PAC PT "6 Clicks" Mobility  Outcome Measure Help needed turning from your back to your side while in a flat bed without using bedrails?: None Help needed moving from lying on your back to sitting on the side of a flat bed without using bedrails?: A Little Help needed moving to and from a bed to a chair (including a wheelchair)?: A Little Help needed standing up from a chair using your arms (e.g., wheelchair or bedside chair)?: A Lot Help needed to walk in hospital room?: Total Help needed climbing 3-5 steps with a railing? : Total 6 Click Score: 14    End of Session Equipment Utilized During Treatment: Gait belt Activity Tolerance: Patient tolerated treatment well Patient left: in chair;with call bell/phone within reach Nurse Communication: Mobility status PT Visit Diagnosis: Unsteadiness on feet (R26.81);Muscle weakness (generalized) (M62.81)    Time: 1610-9604 PT Time Calculation (min) (ACUTE ONLY): 18 min   Charges:   PT Evaluation $PT Eval Low Complexity: 1 Low  PT General Charges $$ ACUTE PT VISIT: 1 Visit         Elie Goody, DPT [facility] [place] [phone] office  Nelida Meuse 12/16/2023, 9:56 AM

## 2023-12-16 NOTE — ED Triage Notes (Signed)
Pt tripped while in bathroom hitting L rib area on tub. Pt c/o pain L rib area rated 8/10. Given Fentanyl IV en route. Denies any other injury.

## 2023-12-16 NOTE — TOC Initial Note (Signed)
Transition of Care [facility]) - Initial/Assessment Note    Patient Details  Name: Kristi Sawyer MRN: [id number] Date of Birth: 10-01-1926  [facility] of [facility]) CM/SW Contact:    Karn Cassis, LCSW Phone Number: 12/16/2023, 3:42 PM  Clinical Narrative: Pt admitted following fall at home with rib fractures. Pt reports she lives alone and is independent at baseline. She ambulates with a walker. PT evaluated pt and recommend SNF. Discussed placement process and Medicare.gov ratings. Pt open to placement in [facility]. LCSW will initiate bed search and start auth.                    Expected Discharge Plan: Skilled Nursing Facility Barriers to Discharge: Continued Medical Work up   Patient Goals and CMS Choice Patient states their goals for this hospitalization and ongoing recovery are:: SNF   Choice offered to / list presented to : Patient [place] ownership interest in [facility].provided to:: Patient    Expected Discharge Plan and Services In-house Referral: Clinical Social Work   Post Acute Care Choice: Skilled Nursing Facility Living arrangements for the past 2 months: Single Family Home                                      Prior Living Arrangements/Services Living arrangements for the past 2 months: Single Family Home Lives with:: Self Patient language and need for interpreter reviewed:: Yes Do you feel safe going back to the place where you live?: Yes      Need for Family Participation in Patient Care: No (Comment)   Current home services: DME (walker, cane) Criminal Activity/Legal Involvement Pertinent to Current Situation/Hospitalization: No - Comment as needed  Activities of Daily Living   ADL Screening (condition at time of admission) Independently performs ADLs?: Yes (appropriate for developmental age) Is the patient deaf or have difficulty hearing?: No Does the patient have difficulty seeing, even when wearing  glasses/contacts?: No Does the patient have difficulty concentrating, remembering, or making decisions?: No  Permission Sought/Granted                  Emotional Assessment     Affect (typically observed): Appropriate Orientation: : Oriented to Self, Oriented to Place, Oriented to  Time, Oriented to Situation Alcohol / Substance Use: Not Applicable Psych Involvement: No (comment)  Admission diagnosis:  Fall, initial encounter [W19.XXXA] Closed fracture of multiple ribs of left side, initial encounter [S22.42XA] Closed traumatic nondisplaced fracture of four ribs of left side, initial encounter [S22.42XA] Patient Active Problem List   Diagnosis Date Noted   Closed traumatic nondisplaced fracture of four ribs of left side, initial encounter 12/16/2023   Sepsis due to undetermined organism (HCC) 08/30/2023   Fall at home, initial encounter 08/27/2023   CAP (community acquired pneumonia) 08/27/2023    Class: Acute   SIRS (systemic inflammatory response syndrome) (HCC) 08/27/2023   Hyponatremia 08/27/2023   Generalized weakness 08/27/2023   Hypokalemia 07/27/2023   Debility 07/26/2023   Falls 12/19/2021   Ataxic gait    UTI (urinary tract infection)    Physical deconditioning    Osteoporosis without current pathological fracture 11/23/2020   Hypertension 10/28/2017   Overactive bladder 06/18/2015   Hyperlipidemia 09/26/2010   ANXIETY 09/26/2010   GERD (gastroesophageal reflux disease) 09/26/2010   CELIAC SPRUE 09/26/2010   PCP:  Dettinger, Elige Radon, MD Pharmacy:   [facility] [phone] -  [place], [place] - [address] [place] [address] Phone: [phone] Fax: [phone]     Social Drivers of Health (SDOH) Social History: SDOH Screenings   Food Insecurity: No Food Insecurity (12/16/2023)  Housing: Low Risk  (12/16/2023)  Transportation Needs: No Transportation Needs (12/16/2023)  Utilities: Not At Risk (12/16/2023)  Alcohol Screen: Low  Risk  (12/15/2022)  Depression (PHQ2-9): Low Risk  (09/11/2023)  Financial Resource Strain: Low Risk  (12/15/2022)  Physical Activity: Insufficiently Active (12/15/2022)  Social Connections: Moderately Integrated (12/16/2023)  Stress: No Stress Concern Present (12/15/2022)  Tobacco Use: Low Risk  (12/16/2023)   SDOH Interventions:     Readmission Risk Interventions     No data to display

## 2023-12-16 NOTE — Progress Notes (Signed)
   12/16/23 1009  Vitals  Temp 98.9 F (37.2 C)  BP (!) 163/92  MAP (mmHg) 114  Pulse Rate (!) 54  Resp 17  Level of Consciousness  Level of Consciousness Alert  MEWS COLOR  MEWS Score Color Green  Oxygen Therapy  SpO2 99 %  O2 Device Room Air  MEWS Score  MEWS Temp 0  MEWS Systolic 0  MEWS Pulse 0  MEWS RR 0  MEWS LOC 0  MEWS Score 0   MD Johnson aware of bp will continue to monitor

## 2023-12-16 NOTE — Hospital Course (Signed)
89 year old female with osteoporosis, lives alone, falls frequently, history of hypertension, GERD, GAD, hyperlipidemia reportedly tripped while in the bathroom early this morning hitting her left rib cage on that tub.  She has been complaining of severe 8/10 left-sided rib cage pain.  Patient reportedly did not hit her head.  In the ED she was evaluated and sent for CT chest without contrast with findings of acute nondisplaced fractures of the posterior lateral left ribs 9 through 12.  Her metabolic workup was unremarkable.  Her urinalysis was negative.  She is being admitted for pain management and PT evaluation.

## 2023-12-16 NOTE — ED Provider Notes (Signed)
[place] EMERGENCY DEPARTMENT AT [facility] Provider Note   CSN: [id number] Arrival date & time: 12/16/23  0241     History  Chief Complaint  Patient presents with   Marletta Lor    Kristi Sawyer is a 89 y.o. female.   Fall Associated symptoms include chest pain.  Patient presents after fall.  Medical history includes HLD, GERD, HTN, osteoporosis, anxiety, anemia.  Patient describes a mechanical fall while in the bathroom, caused by stumbling over her walker in the toilet.  She struck her left chest wall on the tub.  She has since had pain in this area.  She denies striking her head.  She received 50 mcg of fentanyl prior to arrival.  Current pain is 5/10 in severity.     Home Medications Prior to Admission medications   Medication Sig Start Date End Date Taking? Authorizing Provider  acetaminophen (TYLENOL) 325 MG tablet Take 2 tablets (650 mg total) by mouth every 6 (six) hours as needed for mild pain (or Fever >/= 101). 12/20/21   Shon Hale, MD  amLODipine (NORVASC) 5 MG tablet Take 1 tablet (5 mg total) by mouth daily. 04/18/23   Dettinger, Elige Radon, MD  Cholecalciferol (VITAMIN D) 2000 UNITS CAPS Take by mouth as directed. Take 1 tablet daily except none on saturdays and sundays    [provider]  [employer] VITAMIN C 500 MG tablet TAKE 1 TABLET (500 MG TOTAL) BY MOUTH DAILY. 07/30/23   Dettinger, Elige Radon, MD  Fe Fum-FePoly-Vit C-Vit B3 (INTEGRA) 62.5-62.5-40-3 MG CAPS TAKE 1 CAPSULE BY MOUTH EVERY DAY 09/17/23   [name], MD  GEMTESA 75 MG TABS Take 1 tablet by mouth daily. 07/14/22   [provider]  metoCLOPramide (REGLAN) 10 MG tablet TAKE 1 TABLET BY MOUTH EVERYDAY AT BEDTIME 09/17/23   [name], MD  Multiple Vitamins-Minerals (CENTRUM SILVER PO) Take 1 tablet by mouth daily.    [provider]  Omega-3 Fatty Acids (FISH OIL) 1000 MG CAPS Take 1 capsule by mouth 3 (three) times daily.     [provider]  omeprazole  (PRILOSEC) 20 MG capsule PLEASE MAKE A FOLLOW UP APPOINTMENT. (TAKE1-2 CAPSULES BY MOUTH DAILY(20-40MG ) 04/16/23   Quentin Mulling R, PA-C  potassium chloride SA (KLOR-CON M) 20 MEQ tablet Take 1 tablet (20 mEq total) by mouth daily. 08/31/23   [name], MD  rosuvastatin (CRESTOR) 5 MG tablet TAKE 1 TABLET BY MOUTH ON MONDAY, WEDNESDAY, AND FRIDAY 04/18/23   Dettinger, Elige Radon, MD  zinc sulfate 220 (50 Zn) MG capsule TAKE 1 CAPSULE BY MOUTH EVERY DAY 09/17/23   Dettinger, Elige Radon, MD      Allergies    Prevnar 13 [pneumococcal 13-val conj vacc], Penicillins, Tetanus toxoids, Codeine, and Sulfonamide derivatives    Review of Systems   Review of Systems  Cardiovascular:  Positive for chest pain.  All other systems reviewed and are negative.   Physical Exam Updated Vital Signs BP (!) 166/71 (BP Location: Left Arm)   Pulse 64   Temp 98.8 F (37.1 C) (Oral)   Resp 18   Ht 5' (1.524 m)   Wt 42 kg   SpO2 95%   BMI 18.08 kg/m  Physical Exam Vitals and nursing note reviewed.  Constitutional:      General: She is not in acute distress.    Appearance: Normal appearance. She is well-developed. She is not ill-appearing, toxic-appearing or diaphoretic.  HENT:     Head: Normocephalic  and atraumatic.     Right Ear: External ear normal.     Left Ear: External ear normal.     Nose: Nose normal.     Mouth/Throat:     Mouth: Mucous membranes are moist.  Eyes:     Extraocular Movements: Extraocular movements intact.     Conjunctiva/sclera: Conjunctivae normal.  Cardiovascular:     Rate and Rhythm: Normal rate and regular rhythm.  Pulmonary:     Effort: Pulmonary effort is normal. No respiratory distress.  Chest:     Chest wall: Tenderness present.  Abdominal:     General: There is no distension.     Palpations: Abdomen is soft.     Tenderness: There is no abdominal tenderness.  Musculoskeletal:        General: No swelling or deformity. Normal range of motion.     Cervical back: Normal  range of motion and neck supple.  Skin:    General: Skin is warm and dry.     Coloration: Skin is not jaundiced or pale.  Neurological:     General: No focal deficit present.     Mental Status: She is alert and oriented to person, place, and time.  Psychiatric:        Mood and Affect: Mood normal.        Behavior: Behavior normal.     ED Results / Procedures / Treatments   Labs (all labs ordered are listed, but only abnormal results are displayed) Labs Reviewed  COMPREHENSIVE METABOLIC PANEL - Abnormal; Notable for the following components:      Result Value   Glucose, Bld 129 (*)    Total Protein 6.1 (*)    AST 14 (*)    Alkaline Phosphatase 32 (*)    All other components within normal limits  URINALYSIS, ROUTINE W REFLEX MICROSCOPIC - Abnormal; Notable for the following components:   Hgb urine dipstick SMALL (*)    Ketones, ur 5 (*)    Protein, ur 100 (*)    All other components within normal limits  CBC    EKG None  Radiology CT Chest Wo Contrast Result Date: 12/16/2023 CLINICAL DATA:  Patient tripped and fell hitting left rib area on tub. Left rib pain. EXAM: CT CHEST WITHOUT CONTRAST TECHNIQUE: Multidetector CT imaging of the chest was performed following the standard protocol without IV contrast. RADIATION DOSE REDUCTION: This exam was performed according to the departmental dose-optimization program which includes automated exposure control, adjustment of the mA and/or kV according to patient size and/or use of iterative reconstruction technique. COMPARISON:  CTA chest 08/27/2023 FINDINGS: Cardiovascular: No pericardial effusion. Coronary artery and aortic atherosclerotic calcification. Mediastinum/Nodes: Trachea and esophagus are unremarkable. No thoracic adenopathy. Lungs/Pleura: Scattered centrilobular micro nodules with clustered micro nodules in the right upper lobe. Bibasilar atelectasis/scarring. Mild diffuse bronchial wall thickening. No pleural effusion or  pneumothorax. Upper Abdomen: No acute abnormality. Musculoskeletal: Acute nondisplaced fractures of the left posterolateral 9th-12th ribs. Multiple additional chronic bilateral right rib fractures. IMPRESSION: 1. Acute nondisplaced fractures of the left posterolateral 9th-12th ribs. No pneumothorax. 2. Scattered centrilobular micro nodules with clustered micro nodules in the right upper lobe, likely infectious/inflammatory. 3.  Aortic Atherosclerosis (ICD10-I70.0). Electronically Signed   By: Minerva Fester M.D.   On: 12/16/2023 04:06    Procedures Procedures    Medications Ordered in ED Medications  lidocaine (LIDODERM) 5 % 1 patch (1 patch Transdermal Patch Applied 12/16/23 0314)  acetaminophen (TYLENOL) tablet 650 mg (650 mg Oral Given 12/16/23  0444)  methocarbamol (ROBAXIN) tablet 500 mg (has no administration in time range)  oxyCODONE (Oxy IR/ROXICODONE) immediate release tablet 5 mg (has no administration in time range)  oxyCODONE-acetaminophen (PERCOCET/ROXICET) 5-325 MG per tablet 1 tablet (1 tablet Oral Given 12/16/23 0312)  ketorolac (TORADOL) 15 MG/ML injection 15 mg (15 mg Intravenous Given 12/16/23 0314)    ED Course/ Medical Decision Making/ A&P                                 Medical Decision Making Amount and/or Complexity of Data Reviewed Labs: ordered. Radiology: ordered.  Risk OTC drugs. Prescription drug management.   This patient presents to the ED for concern of fall, this involves an extensive number of treatment options, and is a complaint that carries with it a high risk of complications and morbidity.  The differential diagnosis includes acute injuries   Co morbidities that complicate the patient evaluation  HLD, GERD, HTN, osteoporosis, anxiety, anemia   Additional history obtained:  Additional history obtained from N/A External records from outside source obtained and reviewed including EMR   Lab Tests:  I Ordered, and personally interpreted labs.   The pertinent results include: Normal hemoglobin, no leukocytosis, normal kidney function, normal electrolytes, no evidence of UTI   Imaging Studies ordered:  I ordered imaging studies including CT chest I independently visualized and interpreted imaging which showed acute nondisplaced rib fractures on left side 9-12 I agree with the radiologist interpretation   Cardiac Monitoring: / EKG:  The patient was maintained on a cardiac monitor.  I personally viewed and interpreted the cardiac monitored which showed an underlying rhythm of: Sinus rhythm   Problem List / ED Course / Critical interventions / Medication management  Patient presents after mechanical fall that occurred at home in her bathroom.  During this fall, she struck the left side of her lower ribs.  She has since had pain in this area.  She denies any other areas of pain or suspected injury.  She adamantly denies striking her head.  On arrival in the ED, patient is overall well-appearing.  She has tenderness to left lower lateral ribs.  She has no other areas of tenderness.  She has no extremity deformities.  Range of motion is intact.  Multimodal pain control was ordered.  CT of chest was ordered to assess for acute injuries.  CT showed 4 consecutive nondisplaced rib fractures on left side.  On reassessment, patient's pain is currently controlled.  She reports that she lives alone.  She was offered admission for observation and pain control.  Although her pain is currently relieved.  She does feel more comfortable with admission.  Laboratory workup was initiated.  Results were unremarkable.  Patient was admitted for further management. I ordered medication including oxycodone, Toradol, Tylenol, lidocaine patch, Robaxin for analgesia Reevaluation of the patient after these medicines showed that the patient improved I have reviewed the patients home medicines and have made adjustments as needed   Social Determinants of  Health:  Lives independently          Final Clinical Impression(s) / ED Diagnoses Final diagnoses:  Fall, initial encounter  Closed fracture of multiple ribs of left side, initial encounter    Rx / DC Orders ED Discharge Orders     None         Gloris Manchester, MD 12/16/23 [phone]

## 2023-12-17 DIAGNOSIS — R5381 Other malaise: Secondary | ICD-10-CM | POA: Diagnosis not present

## 2023-12-17 DIAGNOSIS — R296 Repeated falls: Secondary | ICD-10-CM | POA: Diagnosis not present

## 2023-12-17 DIAGNOSIS — R488 Other symbolic dysfunctions: Secondary | ICD-10-CM | POA: Diagnosis not present

## 2023-12-17 DIAGNOSIS — K219 Gastro-esophageal reflux disease without esophagitis: Secondary | ICD-10-CM | POA: Diagnosis not present

## 2023-12-17 DIAGNOSIS — M6281 Muscle weakness (generalized): Secondary | ICD-10-CM | POA: Diagnosis not present

## 2023-12-17 DIAGNOSIS — D649 Anemia, unspecified: Secondary | ICD-10-CM | POA: Diagnosis not present

## 2023-12-17 DIAGNOSIS — R260 Ataxic gait: Secondary | ICD-10-CM | POA: Diagnosis not present

## 2023-12-17 DIAGNOSIS — E785 Hyperlipidemia, unspecified: Secondary | ICD-10-CM | POA: Diagnosis not present

## 2023-12-17 DIAGNOSIS — E440 Moderate protein-calorie malnutrition: Secondary | ICD-10-CM | POA: Diagnosis not present

## 2023-12-17 DIAGNOSIS — S2242XA Multiple fractures of ribs, left side, initial encounter for closed fracture: Secondary | ICD-10-CM | POA: Diagnosis not present

## 2023-12-17 DIAGNOSIS — Z79899 Other long term (current) drug therapy: Secondary | ICD-10-CM | POA: Diagnosis not present

## 2023-12-17 DIAGNOSIS — I700 Atherosclerosis of aorta: Secondary | ICD-10-CM | POA: Diagnosis not present

## 2023-12-17 DIAGNOSIS — S2242XG Multiple fractures of ribs, left side, subsequent encounter for fracture with delayed healing: Secondary | ICD-10-CM | POA: Diagnosis not present

## 2023-12-17 DIAGNOSIS — Z9181 History of falling: Secondary | ICD-10-CM | POA: Diagnosis not present

## 2023-12-17 DIAGNOSIS — R262 Difficulty in walking, not elsewhere classified: Secondary | ICD-10-CM | POA: Diagnosis not present

## 2023-12-17 DIAGNOSIS — N3281 Overactive bladder: Secondary | ICD-10-CM | POA: Diagnosis not present

## 2023-12-17 DIAGNOSIS — R32 Unspecified urinary incontinence: Secondary | ICD-10-CM | POA: Diagnosis not present

## 2023-12-17 DIAGNOSIS — S2242XD Multiple fractures of ribs, left side, subsequent encounter for fracture with routine healing: Secondary | ICD-10-CM | POA: Diagnosis not present

## 2023-12-17 DIAGNOSIS — K900 Celiac disease: Secondary | ICD-10-CM | POA: Diagnosis not present

## 2023-12-17 DIAGNOSIS — K5909 Other constipation: Secondary | ICD-10-CM | POA: Diagnosis not present

## 2023-12-17 DIAGNOSIS — M810 Age-related osteoporosis without current pathological fracture: Secondary | ICD-10-CM | POA: Diagnosis not present

## 2023-12-17 DIAGNOSIS — I10 Essential (primary) hypertension: Secondary | ICD-10-CM | POA: Diagnosis not present

## 2023-12-17 MED ORDER — LIDOCAINE 5 % EX PTCH: 1.0000 | MEDICATED_PATCH | CUTANEOUS | Status: DC

## 2023-12-17 MED ORDER — DOCUSATE SODIUM 100 MG PO CAPS: 100.0000 mg | ORAL_CAPSULE | Freq: Two times a day (BID) | ORAL | Status: AC

## 2023-12-17 MED ORDER — METHOCARBAMOL 500 MG PO TABS: 500.0000 mg | ORAL_TABLET | Freq: Three times a day (TID) | ORAL | Status: DC | PRN

## 2023-12-17 MED ORDER — OXYCODONE HCL 5 MG PO TABS: 5.0000 mg | ORAL_TABLET | Freq: Four times a day (QID) | ORAL | 0 refills | Status: DC | PRN

## 2023-12-17 MED ORDER — PANTOPRAZOLE SODIUM 40 MG PO TBEC
40.0000 mg | DELAYED_RELEASE_TABLET | Freq: Every day | ORAL | Status: DC
  Administered 2023-12-17: 40 mg via ORAL
  Filled 2023-12-17: qty 1

## 2023-12-17 NOTE — Care Management CC44 (Signed)
Condition Code 44 Documentation Completed  Patient Details  Name: TALENA NEIRA MRN: [id number] Date of Birth: 01/05/1926   Condition Code 44 given:  Yes Patient signature on Condition Code 44 notice:  Yes Documentation of 2 MD's agreement:  Yes Code 44 added to claim:  Yes    Elliot Gault, LCSW 12/17/2023, 1:22 PM

## 2023-12-17 NOTE — Progress Notes (Signed)
Patient being discharged to [facility] skilled Nursing facility,report called and given to [facility] . IV discontinued ,catheter intact. Staff to accompany patient to awaiting floor.Family at bedside.

## 2023-12-17 NOTE — TOC Transition Note (Signed)
Transition of Care [facility]) - Discharge Note   Patient Details  Name: Kristi Sawyer MRN: [id number] Date of Birth: 06/13/1926  Transition of Care [facility]) CM/SW Contact:  Elliot Gault, LCSW Phone Number: 12/17/2023, 1:23 PM   Clinical Narrative:     SNF bed offers reviewed with pt who selected [facility]. Insurance auth received. Kerri at [facility] states they can accept pt today.  DC clinical sent electronically. RN to call report.   No other TOC needs for dc.  Final next level of care: Skilled Nursing Facility Barriers to Discharge: Barriers Resolved   Patient Goals and CMS Choice Patient states their goals for this hospitalization and ongoing recovery are:: SNF   Choice offered to / list presented to : Patient [place] ownership interest in [facility].provided to:: Patient    Discharge Placement              Patient chooses bed at: [facility] Patient to be transferred to facility by: w/c Name of family member notified: pt only Patient and family notified of of transfer: 12/17/23  Discharge Plan and Services Additional resources added to the After Visit Summary for   In-house Referral: Clinical Social Work   Post Acute Care Choice: Skilled Nursing Facility                               Social Drivers of Health (SDOH) Interventions SDOH Screenings   Food Insecurity: No Food Insecurity (12/16/2023)  Housing: Low Risk  (12/16/2023)  Transportation Needs: No Transportation Needs (12/16/2023)  Utilities: Not At Risk (12/16/2023)  Alcohol Screen: Low Risk  (12/15/2022)  Depression (PHQ2-9): Low Risk  (09/11/2023)  Financial Resource Strain: Low Risk  (12/15/2022)  Physical Activity: Insufficiently Active (12/15/2022)  Social Connections: Moderately Integrated (12/16/2023)  Stress: No Stress Concern Present (12/15/2022)  Tobacco Use: Low Risk  (12/16/2023)     Readmission Risk Interventions     No data to display

## 2023-12-17 NOTE — Progress Notes (Signed)
PROGRESS NOTE    Kristi Sawyer  ZOX:[id number] DOB: [birth year] DOA: 12/16/2023 PCP: Dettinger, Elige Radon, MD    Brief Narrative:   Kristi Sawyer is a 89 y.o. female with past medical history significant for HTN, HLD, generalized anxiety disorder, GERD, osteoporosis who presented to [facility] ED on 2/2 with complaint of left-sided rib pain following mechanical fall.  Patient reported tripped while in her bathroom hitting the left rib area on the tub.  Denies loss of consciousness, denies striking head and denies pain elsewhere.  On EMS arrival, patient was given IV fentanyl and transported to the ED for further evaluation.  In the ED, temperature 98.8 F, HR 69, RR 18, BP 152/70, SpO2 96% on room air.  WBC 6.9, hemoglobin 14.3, platelet count 160.  Sodium 136, potassium 3.7, chloride 100, CO2 26, glucose 129, BUN 11, creatinine 0.54.  AST 14, ALT 12, total bilirubin 0.8.  Urinalysis unrevealing.  CT chest without contrast with acute nondisplaced fractures left posterior lateral 9th-12th ribs, no pneumothorax; scattered central lobar micronodules with clustered micronodules right upper lobe likely infectious versus inflammatory, aortic atherosclerosis.  Patient was started on lidocaine patch, given Robaxin, oxycodone, Toradol.  TRH consulted for admission for further evaluation management of rib fractures secondary to mechanical fall and pain control.  Assessment & Plan:   Acute nondisplaced left rib fractures 9th through 12th Mechanical fall Patient presenting to ED with left sided rib pain following mechanical fall in the bathroom.  Denies syncope, no loss of consciousness, did not strike her head.  Evaluation in the ED notable for acute nondisplaced fractures left posterior lateral ninth/12th ribs without pneumothorax. -- Lidocaine patch -- Tylenol 650 mg p.o. every 6 hours scheduled -- Oxycodone 5 mg p.o. every 4 hours as needed moderate pain -- Fentanyl 12.5 mcg IV every 2 hours as  needed severe pain -- Robaxin 5 mg p.o. every 8 hours as needed muscle spasm --Fall precautions -- PT/OT recommend SNF placement, TOC following  Essential hypertension -- Amlodipine 5 mg p.o. daily  Hyperlipidemia -- Hold home statin for now  Generalized anxiety disorder Currently not on medication outpatient.  GERD -- Protonix 40 mg p.o. daily (substituted for home omeprazole)    DVT prophylaxis: enoxaparin (LOVENOX) injection 30 mg Start: 12/17/23 0800    Code Status: Limited: Do not attempt resuscitation (DNR) -DNR-LIMITED -Do Not Intubate/DNI  Family Communication:   Disposition Plan:  Level of care: Med-Surg Status is: Inpatient Remains inpatient appropriate because: Pending SNF placement    Consultants:  None  Procedures:  None  Antimicrobials:  None   Subjective: Patient seen examined bedside, resting calmly.  Sitting in bedside chair.  Pain well-controlled.  Just worked with therapy, ambulated to the bathroom.  Awaiting SNF placement.  No specific questions, concerns or complaints at this time.  Denies headache, no dizziness, no chest pain, no palpitations, no shortness of breath, no abdominal pain, no fever/chills/night sweats, no nausea/vomiting/diarrhea, no focal weakness, no fatigue, no paresthesia.  No acute events overnight per nurse staff.  Objective: Vitals:   12/16/23 1640 12/16/23 1943 12/17/23 0503 12/17/23 0842  BP: 130/60 135/64 (!) 146/90 137/70  Pulse: 73 80 78 84  Resp: 16 16 17 18   Temp: 98.3 F (36.8 C) 98.3 F (36.8 C) 98.5 F (36.9 C) 97.8 F (36.6 C)  TempSrc: Oral Oral Oral Oral  SpO2: 97% 99% 99% 100%  Weight:      Height:        Intake/Output Summary (  Last 24 hours) at 12/17/2023 1052 Last data filed at 12/17/2023 0505 Gross per 24 hour  Intake --  Output 500 ml  Net -500 ml   Filed Weights   12/16/23 0249 12/16/23 1004  Weight: 42 kg 37.1 kg    Examination:  Physical Exam: GEN: NAD, alert and oriented x 3, elderly  in appearance HEENT: NCAT, PERRL, EOMI, sclera clear, MMM PULM: CTAB w/o wheezes/crackles, normal respiratory effort, on room air CV: RRR w/o M/G/R, Slight TTP left chest/flank overlying rib cage GI: abd soft, NTND, NABS, no R/G/M MSK: no peripheral edema, muscle strength globally intact 5/5 bilateral upper/lower extremities NEURO: CN II-XII intact, no focal deficits, sensation to light touch intact PSYCH: normal mood/affect Integumentary: No concerning rashes/lesions/wounds noted on exposed skin surfaces    Data Reviewed: I have personally reviewed following labs and imaging studies  CBC: Recent Labs  Lab 12/16/23 0441 12/16/23 0911  WBC 6.9 11.1*  HGB 14.3 14.4  HCT 41.8 42.6  MCV 93.5 93.4  PLT 160 170   Basic Metabolic Panel: Recent Labs  Lab 12/16/23 0441 12/16/23 0911  NA 136  --   K 3.7  --   CL 100  --   CO2 26  --   GLUCOSE 129*  --   BUN 11  --   CREATININE 0.54 0.50  CALCIUM 9.5  --    GFR: Estimated Creatinine Clearance: 24.6 mL/min (by C-G formula based on SCr of 0.5 mg/dL). Liver Function Tests: Recent Labs  Lab 12/16/23 0441  AST 14*  ALT 12  ALKPHOS 32*  BILITOT 0.8  PROT 6.1*  ALBUMIN 3.8   No results for input(s): "LIPASE", "AMYLASE" in the last 168 hours. No results for input(s): "AMMONIA" in the last 168 hours. Coagulation Profile: No results for input(s): "INR", "PROTIME" in the last 168 hours. Cardiac Enzymes: No results for input(s): "CKTOTAL", "CKMB", "CKMBINDEX", "TROPONINI" in the last 168 hours. BNP (last 3 results) No results for input(s): "PROBNP" in the last 8760 hours. HbA1C: No results for input(s): "HGBA1C" in the last 72 hours. CBG: No results for input(s): "GLUCAP" in the last 168 hours. Lipid Profile: No results for input(s): "CHOL", "HDL", "LDLCALC", "TRIG", "CHOLHDL", "LDLDIRECT" in the last 72 hours. Thyroid Function Tests: No results for input(s): "TSH", "T4TOTAL", "FREET4", "T3FREE", "THYROIDAB" in the last 72  hours. Anemia Panel: No results for input(s): "VITAMINB12", "FOLATE", "FERRITIN", "TIBC", "IRON", "RETICCTPCT" in the last 72 hours. Sepsis Labs: No results for input(s): "PROCALCITON", "LATICACIDVEN" in the last 168 hours.  No results found for this or any previous visit (from the past 240 hours).       Radiology Studies: CT Chest Wo Contrast Result Date: 12/16/2023 CLINICAL DATA:  Patient tripped and fell hitting left rib area on tub. Left rib pain. EXAM: CT CHEST WITHOUT CONTRAST TECHNIQUE: Multidetector CT imaging of the chest was performed following the standard protocol without IV contrast. RADIATION DOSE REDUCTION: This exam was performed according to the departmental dose-optimization program which includes automated exposure control, adjustment of the mA and/or kV according to patient size and/or use of iterative reconstruction technique. COMPARISON:  CTA chest 08/27/2023 FINDINGS: Cardiovascular: No pericardial effusion. Coronary artery and aortic atherosclerotic calcification. Mediastinum/Nodes: Trachea and esophagus are unremarkable. No thoracic adenopathy. Lungs/Pleura: Scattered centrilobular micro nodules with clustered micro nodules in the right upper lobe. Bibasilar atelectasis/scarring. Mild diffuse bronchial wall thickening. No pleural effusion or pneumothorax. Upper Abdomen: No acute abnormality. Musculoskeletal: Acute nondisplaced fractures of the left posterolateral 9th-12th ribs. Multiple additional  chronic bilateral right rib fractures. IMPRESSION: 1. Acute nondisplaced fractures of the left posterolateral 9th-12th ribs. No pneumothorax. 2. Scattered centrilobular micro nodules with clustered micro nodules in the right upper lobe, likely infectious/inflammatory. 3.  Aortic Atherosclerosis (ICD10-I70.0). Electronically Signed   By: Minerva Fester M.D.   On: 12/16/2023 04:06        Scheduled Meds:  acetaminophen  650 mg Oral Q6H   amLODipine  5 mg Oral Daily   docusate  sodium  100 mg Oral BID   enoxaparin (LOVENOX) injection  30 mg Subcutaneous Q24H   lidocaine  1 patch Transdermal Q24H   Continuous Infusions:   LOS: 1 day    Time spent: 52 minutes spent on chart review, discussion with nursing staff, consultants, updating family and interview/physical exam; more than 50% of that time was spent in counseling and/or coordination of care.    Alvira Philips [place], DO [facility] Available via Epic secure chat 7am-7pm After these hours, please refer to coverage provider listed on [email or web address] 12/17/2023, 10:52 AM

## 2023-12-17 NOTE — Care Management Obs Status (Signed)
MEDICARE OBSERVATION STATUS NOTIFICATION   Patient Details  Name: Kristi Sawyer MRN: [id number] Date of Birth: July 13, 1926   Medicare Observation Status Notification Given:  Yes    Elliot Gault, LCSW 12/17/2023, 1:22 PM

## 2023-12-17 NOTE — Progress Notes (Signed)
Patient out of bed to chair this am, alert to person,place ,time and situation.No co pain or discomfort noted.Call bell in reach,chair alarm set. Plan of care on going.

## 2023-12-17 NOTE — Discharge Summary (Signed)
Physician Discharge Summary  RONISHA HERRINGSHAW MWU:[id number] DOB: 21-Apr-1926 DOA: 12/16/2023  PCP: Dettinger, Elige Radon, MD  Admit date: 12/16/2023 Discharge date: 12/17/2023  Admitted From: Home  Disposition:  [facility]  Recommendations for Outpatient Follow-up:  Follow up with PCP in 1-2 weeks  Discharge Condition: Stable CODE STATUS: DNR Diet recommendation: Regular/gluten-free diet  History of present illness:  Kristi Sawyer is a 89 y.o. female with past medical history significant for HTN, HLD, generalized anxiety disorder, GERD, osteoporosis who presented to [facility] ED on 2/2 with complaint of left-sided rib pain following mechanical fall.  Patient reported tripped while in her bathroom hitting the left rib area on the tub.  Denies loss of consciousness, denies striking head and denies pain elsewhere.  On EMS arrival, patient was given IV fentanyl and transported to the ED for further evaluation.   In the ED, temperature 98.8 F, HR 69, RR 18, BP 152/70, SpO2 96% on room air.  WBC 6.9, hemoglobin 14.3, platelet count 160.  Sodium 136, potassium 3.7, chloride 100, CO2 26, glucose 129, BUN 11, creatinine 0.54.  AST 14, ALT 12, total bilirubin 0.8.  Urinalysis unrevealing.  CT chest without contrast with acute nondisplaced fractures left posterior lateral 9th-12th ribs, no pneumothorax; scattered central lobar micronodules with clustered micronodules right upper lobe likely infectious versus inflammatory, aortic atherosclerosis.  Patient was started on lidocaine patch, given Robaxin, oxycodone, Toradol.  TRH consulted for admission for   Hospital course:  Acute nondisplaced left rib fractures 9th through 12th Mechanical fall Patient presenting to ED with left sided rib pain following mechanical fall in the bathroom.  Denies syncope, no loss of consciousness, did not strike her head.  Evaluation in the ED notable for acute nondisplaced fractures left posterior lateral ninth/12th  ribs without pneumothorax.  Continue lidocaine patch, Tylenol, oxycodone/Robaxin as needed.  Discharging to SNF for further rehabilitation.   Essential hypertension Amlodipine 5 mg p.o. daily   Hyperlipidemia Continue statin   Generalized anxiety disorder Currently not on medication outpatient.   GERD Continue omeprazole  Discharge Diagnoses:  Principal Problem:   Closed traumatic nondisplaced fracture of four ribs of left side, initial encounter Active Problems:   Falls   Debility   Hyperlipidemia   ANXIETY   Ataxic gait   GERD (gastroesophageal reflux disease)   CELIAC SPRUE   Overactive bladder   Hypertension   Osteoporosis without current pathological fracture   Physical deconditioning    Discharge Instructions  Discharge Instructions     Call MD for:  difficulty breathing, headache or visual disturbances   Complete by: As directed    Call MD for:  extreme fatigue   Complete by: As directed    Call MD for:  persistant dizziness or light-headedness   Complete by: As directed    Call MD for:  persistant nausea and vomiting   Complete by: As directed    Call MD for:  severe uncontrolled pain   Complete by: As directed    Call MD for:  temperature >100.4   Complete by: As directed    Diet - low sodium heart healthy   Complete by: As directed    Increase activity slowly   Complete by: As directed       Allergies as of 12/17/2023       Reactions   Prevnar 13 [pneumococcal 13-val Conj Vacc] Hives   Red/streaking arm   Penicillins Hives   Tetanus Toxoids Other (See Comments)   Area  of injection was red and streaked.   Codeine Nausea Only   Sulfonamide Derivatives Nausea Only        Medication List     TAKE these medications    acetaminophen 325 MG tablet Commonly known as: TYLENOL Take 2 tablets (650 mg total) by mouth every 6 (six) hours as needed for mild pain (or Fever >/= 101).   amLODipine 5 MG tablet Commonly known as: NORVASC Take 1  tablet (5 mg total) by mouth daily.   CENTRUM SILVER PO Take 1 tablet by mouth daily.   [facility]S Vitamin C 500 MG tablet Generic drug: ascorbic acid TAKE 1 TABLET (500 MG TOTAL) BY MOUTH DAILY.   docusate sodium 100 MG capsule Commonly known as: COLACE Take 1 capsule (100 mg total) by mouth 2 (two) times daily.   Fish Oil 1000 MG Caps Take 1 capsule by mouth 3 (three) times daily.   Gemtesa 75 MG Tabs Generic drug: Vibegron Take 1 tablet by mouth daily.   Integra 62.5-62.5-40-3 MG Caps TAKE 1 CAPSULE BY MOUTH EVERY DAY   lidocaine 5 % Commonly known as: LIDODERM Place 1 patch onto the skin daily. Remove & Discard patch within 12 hours or as directed by MD Start taking on: December 18, 2023   methocarbamol 500 MG tablet Commonly known as: ROBAXIN Take 1 tablet (500 mg total) by mouth every 8 (eight) hours as needed for muscle spasms.   metoCLOPramide 10 MG tablet Commonly known as: REGLAN TAKE 1 TABLET BY MOUTH EVERYDAY AT BEDTIME   omeprazole 20 MG capsule Commonly known as: PRILOSEC PLEASE MAKE A FOLLOW UP APPOINTMENT. (TAKE1-2 CAPSULES BY MOUTH DAILY(20-40MG )   oxyCODONE 5 MG immediate release tablet Commonly known as: Oxy IR/ROXICODONE Take 1 tablet (5 mg total) by mouth every 6 (six) hours as needed for moderate pain (pain score 4-6).   potassium chloride SA 20 MEQ tablet Commonly known as: KLOR-CON M Take 1 tablet (20 mEq total) by mouth daily.   rosuvastatin 5 MG tablet Commonly known as: CRESTOR TAKE 1 TABLET BY MOUTH ON MONDAY, WEDNESDAY, AND FRIDAY   Vitamin D 50 MCG (2000 UT) Caps Take by mouth as directed. Take 1 tablet daily except none on saturdays and sundays   zinc sulfate (50mg  elemental zinc) 220 (50 Zn) MG capsule TAKE 1 CAPSULE BY MOUTH EVERY DAY        Contact information for follow-up providers     Dettinger, Elige Radon, MD. Schedule an appointment as soon as possible for a visit in 1 week(s).   Specialties: Family Medicine,  Cardiology Contact information: [address] [place] [address] [phone]              Contact information for after-discharge care     Destination     [facility] .   Service: Skilled Nursing Contact information: [address] [place] [address] [phone]                    Allergies  Allergen Reactions   Prevnar 13 [Pneumococcal 13-Val Conj Vacc] Hives    Red/streaking arm   Penicillins Hives   Tetanus Toxoids Other (See Comments)    Area of injection was red and streaked.   Codeine Nausea Only   Sulfonamide Derivatives Nausea Only    Consultations: None   Procedures/Studies: CT Chest Wo Contrast Result Date: 12/16/2023 CLINICAL DATA:  Patient tripped and fell hitting left rib area on tub. Left rib  pain. EXAM: CT CHEST WITHOUT CONTRAST TECHNIQUE: Multidetector CT imaging of the chest was performed following the standard protocol without IV contrast. RADIATION DOSE REDUCTION: This exam was performed according to the departmental dose-optimization program which includes automated exposure control, adjustment of the mA and/or kV according to patient size and/or use of iterative reconstruction technique. COMPARISON:  CTA chest 08/27/2023 FINDINGS: Cardiovascular: No pericardial effusion. Coronary artery and aortic atherosclerotic calcification. Mediastinum/Nodes: Trachea and esophagus are unremarkable. No thoracic adenopathy. Lungs/Pleura: Scattered centrilobular micro nodules with clustered micro nodules in the right upper lobe. Bibasilar atelectasis/scarring. Mild diffuse bronchial wall thickening. No pleural effusion or pneumothorax. Upper Abdomen: No acute abnormality. Musculoskeletal: Acute nondisplaced fractures of the left posterolateral 9th-12th ribs. Multiple additional chronic bilateral right rib fractures. IMPRESSION: 1. Acute nondisplaced fractures of the left posterolateral 9th-12th ribs. No  pneumothorax. 2. Scattered centrilobular micro nodules with clustered micro nodules in the right upper lobe, likely infectious/inflammatory. 3.  Aortic Atherosclerosis (ICD10-I70.0). Electronically Signed   By: Minerva Fester M.D.   On: 12/16/2023 04:06     Subjective: Seen examined bedside, resting calmly.  Sitting in bedside chair.  Just ambulated to the bathroom with therapist this morning.  Pain controlled.  Discharging to SNF today. No specific questions, concerns or complaints at this time. Denies headache, no dizziness, no chest pain, no palpitations, no shortness of breath, no abdominal pain, no fever/chills/night sweats, no nausea/vomiting/diarrhea, no focal weakness, no fatigue, no paresthesia. No acute events overnight per nursing staff.   Discharge Exam: Vitals:   12/17/23 0503 12/17/23 0842  BP: (!) 146/90 137/70  Pulse: 78 84  Resp: 17 18  Temp: 98.5 F (36.9 C) 97.8 F (36.6 C)  SpO2: 99% 100%   Vitals:   12/16/23 1640 12/16/23 1943 12/17/23 0503 12/17/23 0842  BP: 130/60 135/64 (!) 146/90 137/70  Pulse: 73 80 78 84  Resp: 16 16 17 18   Temp: 98.3 F (36.8 C) 98.3 F (36.8 C) 98.5 F (36.9 C) 97.8 F (36.6 C)  TempSrc: Oral Oral Oral Oral  SpO2: 97% 99% 99% 100%  Weight:      Height:        Physical Exam: GEN: NAD, alert and oriented x 3, elderly in appearance HEENT: NCAT, PERRL, EOMI, sclera clear, MMM PULM: CTAB w/o wheezes/crackles, normal respiratory effort, on room air CV: RRR w/o M/G/R, Slight TTP left chest/flank overlying rib cage GI: abd soft, NTND, NABS, no R/G/M MSK: no peripheral edema, muscle strength globally intact 5/5 bilateral upper/lower extremities NEURO: CN II-XII intact, no focal deficits, sensation to light touch intact PSYCH: normal mood/affect Integumentary: No concerning rashes/lesions/wounds noted on exposed skin surfaces    The results of significant diagnostics from this hospitalization (including imaging, microbiology,  ancillary and laboratory) are listed below for reference.     Microbiology: No results found for this or any previous visit (from the past 240 hours).   Labs: BNP (last 3 results) No results for input(s): "BNP" in the last 8760 hours. Basic Metabolic Panel: Recent Labs  Lab 12/16/23 0441 12/16/23 0911  NA 136  --   K 3.7  --   CL 100  --   CO2 26  --   GLUCOSE 129*  --   BUN 11  --   CREATININE 0.54 0.50  CALCIUM 9.5  --    Liver Function Tests: Recent Labs  Lab 12/16/23 0441  AST 14*  ALT 12  ALKPHOS 32*  BILITOT 0.8  PROT 6.1*  ALBUMIN 3.8  No results for input(s): "LIPASE", "AMYLASE" in the last 168 hours. No results for input(s): "AMMONIA" in the last 168 hours. CBC: Recent Labs  Lab 12/16/23 0441 12/16/23 0911  WBC 6.9 11.1*  HGB 14.3 14.4  HCT 41.8 42.6  MCV 93.5 93.4  PLT 160 170   Cardiac Enzymes: No results for input(s): "CKTOTAL", "CKMB", "CKMBINDEX", "TROPONINI" in the last 168 hours. BNP: Invalid input(s): "POCBNP" CBG: No results for input(s): "GLUCAP" in the last 168 hours. D-Dimer No results for input(s): "DDIMER" in the last 72 hours. Hgb A1c No results for input(s): "HGBA1C" in the last 72 hours. Lipid Profile No results for input(s): "CHOL", "HDL", "LDLCALC", "TRIG", "CHOLHDL", "LDLDIRECT" in the last 72 hours. Thyroid function studies No results for input(s): "TSH", "T4TOTAL", "T3FREE", "THYROIDAB" in the last 72 hours.  Invalid input(s): "FREET3" Anemia work up No results for input(s): "VITAMINB12", "FOLATE", "FERRITIN", "TIBC", "IRON", "RETICCTPCT" in the last 72 hours. Urinalysis    Component Value Date/Time   COLORURINE YELLOW 12/16/2023 0440   APPEARANCEUR CLEAR 12/16/2023 0440   APPEARANCEUR Clear 09/11/2023 1641   LABSPEC 1.014 12/16/2023 0440   PHURINE 6.0 12/16/2023 0440   GLUCOSEU NEGATIVE 12/16/2023 0440   HGBUR SMALL (A) 12/16/2023 0440   BILIRUBINUR NEGATIVE 12/16/2023 0440   BILIRUBINUR Negative 09/11/2023  1641   KETONESUR 5 (A) 12/16/2023 0440   PROTEINUR 100 (A) 12/16/2023 0440   UROBILINOGEN negative 07/07/2014 1033   NITRITE NEGATIVE 12/16/2023 0440   LEUKOCYTESUR NEGATIVE 12/16/2023 0440   Sepsis Labs Recent Labs  Lab 12/16/23 0441 12/16/23 0911  WBC 6.9 11.1*   Microbiology No results found for this or any previous visit (from the past 240 hours).   Time coordinating discharge: Over 30 minutes  SIGNED:   Alvira Philips [place], DO  [facility] 12/17/2023, 12:21 PM

## 2023-12-18 ENCOUNTER — Non-Acute Institutional Stay (SKILLED_NURSING_FACILITY): Payer: Medicare Other | Admitting: Adult Health

## 2023-12-18 DIAGNOSIS — K219 Gastro-esophageal reflux disease without esophagitis: Secondary | ICD-10-CM | POA: Diagnosis not present

## 2023-12-18 DIAGNOSIS — K5909 Other constipation: Secondary | ICD-10-CM

## 2023-12-18 DIAGNOSIS — I700 Atherosclerosis of aorta: Secondary | ICD-10-CM | POA: Diagnosis not present

## 2023-12-18 DIAGNOSIS — R32 Unspecified urinary incontinence: Secondary | ICD-10-CM | POA: Diagnosis not present

## 2023-12-18 DIAGNOSIS — D649 Anemia, unspecified: Secondary | ICD-10-CM | POA: Insufficient documentation

## 2023-12-18 DIAGNOSIS — S2242XA Multiple fractures of ribs, left side, initial encounter for closed fracture: Secondary | ICD-10-CM

## 2023-12-18 DIAGNOSIS — I10 Essential (primary) hypertension: Secondary | ICD-10-CM

## 2023-12-18 NOTE — Progress Notes (Signed)
 Location:  [facility] Room Number: 133 Place of Service:  SNF (31)   CODE STATUS: dnr   Allergies  Allergen Reactions  . Prevnar 13 [Pneumococcal 13-Val Conj Vacc] Hives    Red/streaking arm  . Penicillins Hives  . Tetanus Toxoids Other (See Comments)    Area of injection was red and streaked.  . Codeine Nausea Only  . Sulfonamide Derivatives Nausea Only    Chief Complaint  Patient presents with  . Hospitalization Follow-up    HPI:  She is a 89 year old woman who has been hospitalized from 12-16-23 through 12-17-23. Her past medical history includes: hypertension; anxiety; GERD osteoporosis. She presented to the ED after she tripped while in the bathroom and suffered left rib cage present. She found to have left posterior rib 9-12th ribs fracture. She is here for short term rehab with her goal to return back home. She will continue to be followed for her chronic illnesses include:  Primary hypertension: Urinary incontinence in female: Chronic anemia:  Past Medical History:  Diagnosis Date  . Acid reflux   . Anemia   . Anxiety   . Cataracts, bilateral   . Celiac disease/sprue   . Chronic gastritis   . Esophageal stricture   . Euthyroid   . Gastritis   . Hiatal hernia   . Hyperlipidemia   . Hypermobility syndrome   . 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 Neuropathy   . Osteoporosis   . Palpitations   . Postmenopausal HRT (hormone replacement therapy)   . Shingles    x 3  . Thyroid  tumor, benign     Past Surgical History:  Procedure Laterality Date  . ABDOMINAL HYSTERECTOMY    . APPENDECTOMY  1935  . CATARACT EXTRACTION Bilateral   . COLONOSCOPY    . ESOPHAGOGASTRODUODENOSCOPY    . EYE SURGERY    . MYOMECTOMY    . PARTIAL HYSTERECTOMY  1974   w 1/2 right ovary   . RETINAL LASER PROCEDURE    . SIGMOIDOSCOPY    . THYROIDECTOMY  1975   rt. benign tumor     Social History   Socioeconomic History  . Marital status:  Widowed    Spouse name: Not on file  . Number of children: 0  . Years of education: 16  . Highest education level: Bachelor's degree (e.g., BA, AB, BS)  Occupational History  . Occupation: retired    Associate Professor: RETIRED  Tobacco Use  . Smoking status: Never  . Smokeless tobacco: Never  Vaping Use  . Vaping status: Never Used  Substance and Sexual Activity  . Alcohol use: No  . Drug use: No  . Sexual activity: Not Currently  Other Topics Concern  . 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 Difficulty of Paying Living Expenses: Not hard at all  Food Insecurity: No Food Insecurity (12/16/2023)   Hunger Vital Sign   . Worried About Programme Researcher, Broadcasting/film/video in the Last Year: Never true   . Ran Out of Food in the Last Year: Never true  Transportation Needs: No Transportation Needs (12/16/2023)  PRAPARE - Transportation   . Lack of Transportation (Medical): No   . Lack of Transportation (Non-Medical): No  Physical Activity: Insufficiently Active (12/15/2022)   Exercise Vital Sign   . Days of Exercise per Week: 3 days   . Minutes of Exercise per Session: 30 min  Stress: No Stress Concern Present (12/15/2022)   [employer] of Occupational Health - Occupational Stress Questionnaire   . Feeling of Stress : Not at all  Social Connections: Moderately Integrated (12/16/2023)   Social Connection and Isolation Panel [NHANES]   . Frequency of Communication with Friends and Family: More than three times a week   . Frequency of Social Gatherings with Friends and Family: More than three times a week   . Attends Religious Services: More than 4 times per year   . Active Member of Clubs or Organizations:  Yes   . Attends Banker Meetings: More than 4 times per year   . Marital Status: Widowed  Intimate Partner Violence: Not At Risk (12/16/2023)   Humiliation, Afraid, Rape, and Kick questionnaire   . Fear of Current or Ex-Partner: No   . Emotionally Abused: No   . Physically Abused: No   . Sexually Abused: No   Family History  Problem Relation Age of Onset  . Healthy Mother   . Lung cancer Father   . Colon cancer Neg Hx   . Stomach cancer Neg Hx   . Esophageal cancer Neg Hx       VITAL SIGNS BP 120/77   Pulse (!) 50   Temp 98.5 F (36.9 C)   Resp 20   Ht 5' (1.524 m)   Wt 80 lb 3.2 oz (36.4 kg)   SpO2 97%   BMI 15.66 kg/m   Outpatient Encounter Medications as of 12/18/2023  Medication Sig  . acetaminophen  (TYLENOL ) 325 MG tablet Take 2 tablets (650 mg total) by mouth every 6 (six) hours as needed for mild pain (or Fever >/= 101).  . amLODipine  (NORVASC ) 5 MG tablet Take 1 tablet (5 mg total) by mouth daily.  . Cholecalciferol  (VITAMIN D ) 2000 UNITS CAPS Take by mouth as directed. Take 1 tablet daily except none on saturdays and sundays  . [facility]S VITAMIN C  500 MG tablet TAKE 1 TABLET (500 MG TOTAL) BY MOUTH DAILY.  . docusate sodium  (COLACE) 100 MG capsule Take 1 capsule (100 mg total) by mouth 2 (two) times daily.  . Fe Fum-FePoly-Vit C-Vit B3 (INTEGRA) 62.5-62.5-40-3 MG CAPS TAKE 1 CAPSULE BY MOUTH EVERY DAY  . GEMTESA 75 MG TABS Take 1 tablet by mouth daily.  . lidocaine  (LIDODERM ) 5 % Place 1 patch onto the skin daily. Remove & Discard patch within 12 hours or as directed by MD  . methocarbamol  (ROBAXIN ) 500 MG tablet Take 1 tablet (500 mg total) by mouth every 8 (eight) hours as needed for muscle spasms.  . metoCLOPramide  (REGLAN ) 10 MG tablet TAKE 1 TABLET BY MOUTH EVERYDAY AT BEDTIME  . Omega-3 Fatty Acids (FISH OIL) 1000 MG CAPS Take 1 capsule by mouth 3 (three) times daily.   . omeprazole  (PRILOSEC) 20 MG capsule PLEASE MAKE A FOLLOW UP APPOINTMENT. (TAKE1-2  CAPSULES BY MOUTH DAILY(20-40MG )  . oxyCODONE  (OXY IR/ROXICODONE ) 5 MG immediate release tablet Take 1 tablet (5 mg total) by mouth every 6 (six) hours as needed for moderate pain (pain score 4-6).  . potassium chloride  SA (KLOR-CON  M) 20 MEQ tablet Take 1 tablet (20 mEq total) by mouth daily.  No facility-administered encounter medications on file as of 12/18/2023.     SIGNIFICANT DIAGNOSTIC EXAMS  TODAY  12-16-23: wbc 6.9; hgb 14.3; hct 41.8; mcv 93.5 plt 160; glucose 129; bun 11; creat 0.54 k+ 3.7; na++ 136; ca 9.5 gfr >60 protein 6.1 albumin 3.78   Review of Systems  Constitutional:  Negative for malaise/fatigue.  Respiratory:  Negative for cough and shortness of breath.   Cardiovascular:  Negative for chest pain, palpitations and leg swelling.  Gastrointestinal:  Negative for abdominal pain, constipation and heartburn.  Musculoskeletal:  Negative for back pain, joint pain and myalgias.  Skin: Negative.   Neurological:  Negative for dizziness.  Psychiatric/Behavioral:  The patient is not nervous/anxious.     Physical Exam Constitutional:      General: She is not in acute distress.    Appearance: She is underweight. She is not diaphoretic.  Neck:     Thyroid : No thyromegaly.  Cardiovascular:     Rate and Rhythm: Normal rate and regular rhythm.     Pulses: Normal pulses.     Heart sounds: Normal heart sounds.  Pulmonary:     Effort: Pulmonary effort is normal. No respiratory distress.     Breath sounds: Normal breath sounds.  Chest:     Comments: Left side tender to palpation  Abdominal:     General: Bowel sounds are normal. There is no distension.     Palpations: Abdomen is soft.     Tenderness: There is no abdominal tenderness.  Musculoskeletal:        General: Normal range of motion.     Cervical back: Neck supple.     Right lower leg: No edema.     Left lower leg: No edema.  Lymphadenopathy:     Cervical: No cervical adenopathy.  Skin:    General: Skin is warm  and dry.  Neurological:     Mental Status: She is alert and oriented to person, place, and time.  Psychiatric:        Mood and Affect: Mood normal.   ASSESSMENT/ PLAN:  TODAY  Closed traumatic nondisplaced fracture of 4 ribs to the left: will continue therapy as directed to improve upon her independence with her adls. She has a lidocaine  patch to her ribs daily; has robaxin  500 mg every 8 hours as needed; has oxycodone  5 mg every 6 hours as needed for pain.   2. Primary hypertension: b/p 120/77: will continue norvasc  5 mg daily   3. Urinary incontinence in female: will continue gemesta 75 mg daily   4. Chronic anemia: will continue integra 62.5/62.5/40.5 daily   5. GERD without esophagitis: will continue prilosec 20 mg daily and reglan  10 mg nightly   6. Chronic constipation: will continue colace twice daily   7. Aortic atherosclerosis: (ct 08-27-23): is not on statin due to advanced age.    Barnie Seip NP [facility]   call [phone]

## 2023-12-19 ENCOUNTER — Non-Acute Institutional Stay (SKILLED_NURSING_FACILITY): Payer: Medicare Other | Admitting: Internal Medicine

## 2023-12-19 DIAGNOSIS — E440 Moderate protein-calorie malnutrition: Secondary | ICD-10-CM

## 2023-12-19 DIAGNOSIS — E46 Unspecified protein-calorie malnutrition: Secondary | ICD-10-CM | POA: Insufficient documentation

## 2023-12-19 DIAGNOSIS — R296 Repeated falls: Secondary | ICD-10-CM

## 2023-12-19 DIAGNOSIS — S2242XG Multiple fractures of ribs, left side, subsequent encounter for fracture with delayed healing: Secondary | ICD-10-CM

## 2023-12-19 DIAGNOSIS — S2242XA Multiple fractures of ribs, left side, initial encounter for closed fracture: Secondary | ICD-10-CM

## 2023-12-19 DIAGNOSIS — I10 Essential (primary) hypertension: Secondary | ICD-10-CM

## 2023-12-19 NOTE — Assessment & Plan Note (Signed)
 BP controlled; no change in antihypertensive medications

## 2023-12-19 NOTE — Patient Instructions (Signed)
 See assessment and plan under each diagnosis in the problem list and acutely for this visit

## 2023-12-19 NOTE — Assessment & Plan Note (Addendum)
 She validates frequent falls related to balance issues.  PT/OT to address this. Monitor for any postural hypotension.

## 2023-12-19 NOTE — Progress Notes (Signed)
 NURSING HOME LOCATION:  [facility] ROOM NUMBER:  [id number] P  CODE STATUS:  DNR  PCP:  Fonda Levins MD  This is a comprehensive admission note to this SNFperformed on this date less than 30 days from date of admission. Included are preadmission medical/surgical history; reconciled medication list; family history; social history and comprehensive review of systems.  Corrections and additions to the records were documented. Comprehensive physical exam was also performed. Additionally a clinical summary was entered for each active diagnosis pertinent to this admission in the Problem List to enhance continuity of care.  HPI: She was hospitalized 2/2 - 12/17/2023 with nondisplaced left posterior lateral rib fractures 9-12 sustained in a mechanical fall.  She had tripped in the bathroom and hit the tub on her left side.   She denied any cardiac or neurologic prodrome.CT of the chest also revealed clustered micronodules in the right upper lobe likely infectious versus inflammatory.  Aortic atherosclerosis was also documented.Lidocaine  patch, Robaxin , oxycodone  and Toradol  were prescribed.  Labs revealed a total protein 6.1 and albumin of 3.8.  Mild hyperglycemia was present with a glucose of 129.  Alkaline phosphatase was reduced at 32. TRH consulted; SNF placement was recommended for rehab.  Past medical and surgical history: Includes essential hypertension, GAD, history of anemia, history of celiac disease/sprue, chronic gastritis, GERD with history of esophageal stricture, dyslipidemia, osteoporosis, history of benign thyroid  tumor, and peripheral neuropathy. Surgeries and procedures include appendectomy, EGD, partial thyroidectomy, and hysterectomy.  Family history: reviewed, non contributory due to advanced age.  Social history: Nondrinker, non-smoker.   Review of systems: Clinical neurocognitive deficits made validity of responses questionable.  Initially she seemed to be  well-oriented.  She stated that she was not well.  She described pain with position change but not with respirations.  She did validate she was having frequent falls prior to admission related to balance issues.  Neurocognitive issue was questioned when I asked her what her occupation. 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  When I asked when she had retired she said 89 and corrected this to 2047.  Constitutional: No fever, significant weight change  Eyes: No redness, discharge, pain, vision change ENT/mouth: No nasal congestion, purulent discharge, earache, change in hearing, sore throat  Cardiovascular: No palpitations, paroxysmal nocturnal dyspnea, claudication, edema  Respiratory: No cough, sputum production, hemoptysis, DOE, significant snoring, apnea Gastrointestinal: No heartburn, dysphagia, abdominal pain, nausea /vomiting, rectal bleeding, melena, change in bowels Genitourinary: No dysuria, hematuria, pyuria, incontinence, nocturia Dermatologic: No rash, pruritus, change in appearance of skin Neurologic: No dizziness, headache, syncope, seizures, numbness, tingling Psychiatric: No significant anxiety, depression, insomnia, anorexia Endocrine: No change in hair/skin/nails, excessive thirst, excessive hunger, excessive urination  Hematologic/lymphatic: No significant bruising, lymphadenopathy, abnormal bleeding Allergy/immunology: No itchy/watery eyes, significant sneezing, urticaria, angioedema  Physical exam:  Pertinent or positive findings: She appears her age.  The left lacrimal gland is prominent.  Lower lids are puffy.  Teeth are malaligned and coated.  Operative scar is present at the anterior inferior neck.  Heart rate is slow and rhythm is slightly irregular.  First and second heart sounds are accentuated.  Breath sounds are decreased.  She has 1+ edema at the sock line.  Pedal pulses are decreased.  Marked ecchymosis is present over the  right ankle anteriorly.  There is irregular hyperpigmentation of the left shin.  Interosseous wasting is present along with limb atrophy.  General appearance: no acute distress, increased work  of breathing is present.   Lymphatic: No lymphadenopathy about the head, neck, axilla. Eyes: No conjunctival inflammation or lid edema is present. There is no scleral icterus. Ears:  External ear exam shows no significant lesions or deformities.   Nose:  External nasal examination shows no deformity or inflammation. Nasal mucosa are pink and moist without lesions, exudates Neck:  No thyromegaly, masses, tenderness noted.    Heart:  No gallop, murmur, click, rub.  Lungs:  without wheezes, rhonchi, rales, rubs. Abdomen: Bowel sounds are normal.  Abdomen is soft and nontender with no organomegaly, hernias, masses. GU: Deferred  Extremities:  No cyanosis, clubbing. Neurologic exam: Balance, Rhomberg, finger to nose testing could not be completed due to clinical state Skin: Warm & dry w/o tenting. No significant rash.  See clinical summary under each active problem in the Problem List with associated updated therapeutic plan

## 2023-12-19 NOTE — Assessment & Plan Note (Signed)
 Total protein 6.1 and albumin 3.8.  Limb atrophy and interosseous wasting present.  Nutritionist to assess at SNF.

## 2023-12-19 NOTE — Assessment & Plan Note (Signed)
She continues to have pain with position change but not with respirations.  No rub auscultated on exam.

## 2023-12-20 ENCOUNTER — Encounter: Payer: Self-pay | Admitting: Internal Medicine

## 2023-12-26 ENCOUNTER — Encounter: Payer: Self-pay | Admitting: Adult Health

## 2023-12-26 ENCOUNTER — Non-Acute Institutional Stay (SKILLED_NURSING_FACILITY): Payer: Medicare Other | Admitting: Adult Health

## 2023-12-26 DIAGNOSIS — S2242XA Multiple fractures of ribs, left side, initial encounter for closed fracture: Secondary | ICD-10-CM | POA: Diagnosis not present

## 2023-12-26 NOTE — Progress Notes (Signed)
 Location:  [facility] Room Number: [id number] Place of Service:  SNF (31)   CODE STATUS: dnr  Allergies  Allergen Reactions   Prevnar 13 [Pneumococcal 13-Val Conj Vacc] Hives    Red/streaking arm   Penicillins Hives   Tetanus Toxoids Other (See Comments)    Area of injection was red and streaked.   Codeine Nausea Only   Sulfonamide Derivatives Nausea Only    Chief Complaint  Patient presents with   Acute Visit    Pain management     HPI:  She is having uncontrolled pain due to Closed traumatic nondisplaced fracture of 4 ribs to the left. The pain is interfering in her ability to sit up straight in the chair. She is presently taking tylenol for pain without relief.   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 - retiredWalks daily - exercise   Close with her large church family   Still driving and very independent   Social Drivers of Corporate investment banker Strain: Low Risk  (12/15/2022)   Overall Financial Resource Strain (CARDIA)    Difficulty of Paying Living Expenses: Not hard at all  Food Insecurity: No Food Insecurity (12/16/2023)   Hunger Vital Sign    Worried About Running Out of Food in the Last Year: Never true    Ran Out of Food in the Last Year: Never true  Transportation Needs: No Transportation Needs (12/16/2023)   PRAPARE - Administrator, Civil Service (Medical): No    Lack of Transportation (Non-Medical): No  Physical Activity: Insufficiently Active (12/15/2022)   Exercise Vital Sign    Days of Exercise per Week: 3  days    Minutes of Exercise per Session: 30 min  Stress: No Stress Concern Present (12/15/2022)   [employer] of Occupational Health - Occupational Stress Questionnaire    Feeling of Stress : Not at all  Social Connections: Moderately Integrated (12/16/2023)   Social Connection and Isolation Panel [NHANES]    Frequency of Communication with Friends and Family: More than three times a week    Frequency of Social Gatherings with Friends and Family: More than three times a week    Attends Religious Services: More than 4 times per year    Active Member of [employer] or Organizations: Yes    Attends Banker Meetings: More than 4 times per year    Marital Status: Widowed  Intimate Partner Violence: Not At Risk (12/16/2023)   Humiliation, Afraid, Rape, and Kick questionnaire    Fear of Current or Ex-Partner: No    Emotionally Abused: No    Physically Abused: No    Sexually Abused: No   Family History  Problem  Relation Age of Onset   Healthy Mother    Lung cancer Father    Colon cancer Neg Hx    Stomach cancer Neg Hx    Esophageal cancer Neg Hx       VITAL SIGNS BP 131/70   Pulse 84   Temp (!) 97.2 F (36.2 C)   Resp (!) 22   Ht 5' (1.524 m)   Wt 84 lb 3.2 oz (38.2 kg)   SpO2 97%   BMI 16.44 kg/m   Outpatient Encounter Medications as of 12/26/2023  Medication Sig   acetaminophen (TYLENOL) 325 MG tablet Take 2 tablets (650 mg total) by mouth every 6 (six) hours as needed for mild pain (or Fever >/= 101).   amLODipine (NORVASC) 5 MG tablet Take 1 tablet (5 mg total) by mouth daily.   Cholecalciferol (VITAMIN D) 2000 UNITS CAPS Take by mouth as directed. Take 1 tablet daily except none on saturdays and sundays   [employer]S VITAMIN C 500 MG tablet TAKE 1 TABLET (500 MG TOTAL) BY MOUTH DAILY.   docusate sodium (COLACE) 100 MG capsule Take 1 capsule (100 mg total) by mouth 2 (two) times daily.   Fe Fum-FePoly-Vit C-Vit B3 (INTEGRA) 62.5-62.5-40-3 MG CAPS TAKE 1 CAPSULE BY MOUTH EVERY DAY   GEMTESA 75 MG TABS Take 1 tablet by mouth daily.   lidocaine (LIDODERM) 5 % Place 1 patch onto the skin daily. Remove & Discard patch within 12 hours or as directed by MD   methocarbamol (ROBAXIN) 500 MG tablet Take 1 tablet (500 mg total) by mouth every 8 (eight) hours as needed for muscle spasms.   metoCLOPramide (REGLAN) 10 MG tablet TAKE 1 TABLET BY MOUTH EVERYDAY AT BEDTIME   Omega-3 Fatty Acids (FISH OIL) 1000 MG CAPS Take 1 capsule by mouth 3 (three) times daily.    omeprazole (PRILOSEC) 20 MG capsule PLEASE MAKE A FOLLOW UP APPOINTMENT. (TAKE1-2 CAPSULES BY MOUTH DAILY(20-40MG )   oxyCODONE (OXY IR/ROXICODONE) 5 MG immediate release tablet Take 1 tablet (5 mg total) by mouth every 6 (six) hours as needed for moderate pain (pain score 4-6).   potassium chloride SA (KLOR-CON M) 20 MEQ tablet Take 1 tablet (20 mEq total) by mouth daily.   No facility-administered encounter medications on file as of  12/26/2023.     SIGNIFICANT DIAGNOSTIC EXAMS  Review of Systems  Constitutional:  Negative for malaise/fatigue.  Respiratory:  Negative for cough and shortness of breath.  Cardiovascular:  Negative for chest pain, palpitations and leg swelling.  Gastrointestinal:  Negative for abdominal pain, constipation and heartburn.  Musculoskeletal:  Positive for back pain and joint pain. Negative for myalgias.  Skin: Negative.   Neurological:  Negative for dizziness.  Psychiatric/Behavioral:  The patient is not nervous/anxious.    Physical Exam Constitutional:      General: She is not in acute distress.    Appearance: She is well-developed. She is not diaphoretic.  Neck:     Thyroid: No thyromegaly.  Cardiovascular:     Rate and Rhythm: Normal rate and regular rhythm.     Heart sounds: Normal heart sounds.  Pulmonary:     Effort: Pulmonary effort is normal. No respiratory distress.     Breath sounds: Normal breath sounds.  Chest:     Comments: Left side tender to palpation  Abdominal:     General: Bowel sounds are normal. There is no distension.     Palpations: Abdomen is soft.     Tenderness: There is no abdominal tenderness.  Musculoskeletal:        General: Normal range of motion.     Cervical back: Neck supple.     Right lower leg: No edema.     Left lower leg: No edema.  Lymphadenopathy:     Cervical: No cervical adenopathy.  Skin:    General: Skin is warm and dry.  Neurological:     Mental Status: She is alert and oriented to person, place, and time.      ASSESSMENT/ PLAN:  TODAY  Closed traumatic nondisplaced fracture of four ribs of left sie subsequent encounter: pain is not managed will begin mobic 7.5 mg daily and will monitor her status.    Synthia Innocent NP [facility]  call [phone]

## 2024-01-01 DIAGNOSIS — R262 Difficulty in walking, not elsewhere classified: Secondary | ICD-10-CM | POA: Diagnosis not present

## 2024-01-01 DIAGNOSIS — M6281 Muscle weakness (generalized): Secondary | ICD-10-CM | POA: Diagnosis not present

## 2024-01-01 DIAGNOSIS — R488 Other symbolic dysfunctions: Secondary | ICD-10-CM | POA: Diagnosis not present

## 2024-01-01 DIAGNOSIS — Z9181 History of falling: Secondary | ICD-10-CM | POA: Diagnosis not present

## 2024-01-02 DIAGNOSIS — M6281 Muscle weakness (generalized): Secondary | ICD-10-CM | POA: Diagnosis not present

## 2024-01-02 DIAGNOSIS — Z9181 History of falling: Secondary | ICD-10-CM | POA: Diagnosis not present

## 2024-01-02 DIAGNOSIS — R488 Other symbolic dysfunctions: Secondary | ICD-10-CM | POA: Diagnosis not present

## 2024-01-02 DIAGNOSIS — R262 Difficulty in walking, not elsewhere classified: Secondary | ICD-10-CM | POA: Diagnosis not present

## 2024-01-03 DIAGNOSIS — M6281 Muscle weakness (generalized): Secondary | ICD-10-CM | POA: Diagnosis not present

## 2024-01-03 DIAGNOSIS — R488 Other symbolic dysfunctions: Secondary | ICD-10-CM | POA: Diagnosis not present

## 2024-01-03 DIAGNOSIS — R262 Difficulty in walking, not elsewhere classified: Secondary | ICD-10-CM | POA: Diagnosis not present

## 2024-01-03 DIAGNOSIS — Z9181 History of falling: Secondary | ICD-10-CM | POA: Diagnosis not present

## 2024-01-04 ENCOUNTER — Non-Acute Institutional Stay (SKILLED_NURSING_FACILITY): Payer: Medicare Other | Admitting: Adult Health

## 2024-01-04 DIAGNOSIS — S2242XA Multiple fractures of ribs, left side, initial encounter for closed fracture: Secondary | ICD-10-CM

## 2024-01-04 DIAGNOSIS — M6281 Muscle weakness (generalized): Secondary | ICD-10-CM | POA: Diagnosis not present

## 2024-01-04 DIAGNOSIS — I700 Atherosclerosis of aorta: Secondary | ICD-10-CM | POA: Diagnosis not present

## 2024-01-04 DIAGNOSIS — D649 Anemia, unspecified: Secondary | ICD-10-CM

## 2024-01-04 DIAGNOSIS — R488 Other symbolic dysfunctions: Secondary | ICD-10-CM | POA: Diagnosis not present

## 2024-01-04 DIAGNOSIS — Z9181 History of falling: Secondary | ICD-10-CM | POA: Diagnosis not present

## 2024-01-04 DIAGNOSIS — R262 Difficulty in walking, not elsewhere classified: Secondary | ICD-10-CM | POA: Diagnosis not present

## 2024-01-04 NOTE — Progress Notes (Signed)
 Location:  [facility] Room Number: 134 Place of Service:  SNF (31)   CODE STATUS: dnr   Allergies  Allergen Reactions   Prevnar 13 [Pneumococcal 13-Val Conj Vacc] Hives    Red/streaking arm   Penicillins Hives   Tetanus Toxoids Other (See Comments)    Area of injection was red and streaked.   Codeine Nausea Only   Sulfonamide Derivatives Nausea Only    Chief Complaint  Patient presents with   Acute Visit    Care plan meeting     HPI:  We have come together for her care plan meeting. Family present  BIMS 14/15 mood 5/30: decreased energy, nervous. Using wheelchair without falls since admission. She requires moderate assist with her adl care. She is frequently incontinent of bladder and bowel. Dietary: setup for meals; regular diet gluten free appetite 26-75% weight is 85.6 pounds. Therapy: ambulate 100 feet with rolling walker with contact guard; sit to stand contact guard; bed mobility supervision; steps with 2 rails at contact guard upper body supervision; lower body contact guard; brp contact guard 25/50 BCAT  . 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 - retiredWalks daily - exercise   Close with her large church family   Still driving and very independent   Social Drivers of Corporate investment banker Strain: Low Risk  (12/15/2022)   Overall Financial Resource Strain (CARDIA)    Difficulty of Paying Living Expenses: Not hard at all  Food Insecurity:  No Food Insecurity (12/16/2023)   Hunger Vital Sign    Worried About Running Out of Food in the Last Year: Never true    Ran Out of Food in the Last Year: Never true  Transportation Needs: No Transportation Needs (12/16/2023)   PRAPARE - Administrator, Civil Service (Medical): No    Lack of Transportation (Non-Medical): No  Physical Activity: Insufficiently Active (12/15/2022)   Exercise Vital Sign    Days of Exercise per Week: 3 days    Minutes of Exercise per Session: 30 min  Stress: No Stress Concern Present (12/15/2022)   [employer] of Occupational Health - Occupational Stress Questionnaire    Feeling of Stress : Not at all  Social Connections: Moderately Integrated (12/16/2023)   Social Connection and Isolation Panel [NHANES]    Frequency of Communication with Friends and Family: More than three  times a week    Frequency of Social Gatherings with Friends and Family: More than three times a week    Attends Religious Services: More than 4 times per year    Active Member of [employer] or Organizations: Yes    Attends Banker Meetings: More than 4 times per year    Marital Status: Widowed  Intimate Partner Violence: Not At Risk (12/16/2023)   Humiliation, Afraid, Rape, and Kick questionnaire    Fear of Current or Ex-Partner: No    Emotionally Abused: No    Physically Abused: No    Sexually Abused: No   Family History  Problem Relation Age of Onset   Healthy Mother    Lung cancer Father    Colon cancer Neg Hx    Stomach cancer Neg Hx    Esophageal cancer Neg Hx       VITAL SIGNS BP 120/82   Pulse 90   Temp (!) 97.5 F (36.4 C) (Temporal)   Resp 20   Ht 5' (1.524 m)   Wt 85 lb 9.6 oz (38.8 kg)   SpO2 100%   BMI 16.72 kg/m   Outpatient Encounter Medications as of 01/04/2024  Medication Sig   acetaminophen (TYLENOL) 325 MG tablet Take 2 tablets (650 mg total) by mouth every 6 (six) hours as needed for mild pain (or Fever >/= 101).   amLODipine (NORVASC) 5 MG tablet Take 1 tablet (5 mg total) by mouth daily.   Cholecalciferol (VITAMIN D) 2000 UNITS CAPS Take by mouth as directed. Take 1 tablet daily except none on saturdays and sundays   [employer]S VITAMIN C 500 MG tablet TAKE 1 TABLET (500 MG TOTAL) BY MOUTH DAILY.   docusate sodium (COLACE) 100 MG capsule Take 1 capsule (100 mg total) by mouth 2 (two) times daily.   Fe Fum-FePoly-Vit C-Vit B3 (INTEGRA) 62.5-62.5-40-3 MG CAPS TAKE 1 CAPSULE BY MOUTH EVERY DAY   GEMTESA 75 MG TABS Take 1 tablet by mouth daily.   lidocaine (LIDODERM) 5 % Place 1 patch onto the skin daily. Remove & Discard patch within 12 hours or as directed by MD   meloxicam (MOBIC) 7.5 MG tablet Take 7.5 mg by mouth daily.   methocarbamol (ROBAXIN) 500 MG tablet Take 1 tablet (500 mg total) by mouth every 8 (eight) hours as needed for muscle spasms.    metoCLOPramide (REGLAN) 10 MG tablet TAKE 1 TABLET BY MOUTH EVERYDAY AT BEDTIME   Omega-3 Fatty Acids (FISH OIL) 1000 MG CAPS Take 1 capsule by mouth 3 (three) times daily.    omeprazole (PRILOSEC) 20 MG capsule PLEASE MAKE  A FOLLOW UP APPOINTMENT. (TAKE1-2 CAPSULES BY MOUTH DAILY(20-40MG )   potassium chloride SA (KLOR-CON M) 20 MEQ tablet Take 1 tablet (20 mEq total) by mouth daily.   SPIKEVAX syringe Inject 0.5 mLs into the muscle once.   [DISCONTINUED] oxyCODONE (OXY IR/ROXICODONE) 5 MG immediate release tablet Take 1 tablet (5 mg total) by mouth every 6 (six) hours as needed for moderate pain (pain score 4-6).   No facility-administered encounter medications on file as of 01/04/2024.     SIGNIFICANT DIAGNOSTIC EXAMS  Review of Systems  Constitutional:  Negative for malaise/fatigue.  Respiratory:  Negative for cough and shortness of breath.   Cardiovascular:  Negative for chest pain, palpitations and leg swelling.  Gastrointestinal:  Negative for abdominal pain, constipation and heartburn.  Musculoskeletal:  Negative for back pain, joint pain and myalgias.  Skin: Negative.   Neurological:  Negative for dizziness.  Psychiatric/Behavioral:  The patient is not nervous/anxious.    Physical Exam Constitutional:      General: She is not in acute distress.    Appearance: She is well-developed. She is not diaphoretic.  Neck:     Thyroid: No thyromegaly.  Cardiovascular:     Rate and Rhythm: Normal rate and regular rhythm.     Heart sounds: Normal heart sounds.  Pulmonary:     Effort: Pulmonary effort is normal. No respiratory distress.     Breath sounds: Normal breath sounds.  Abdominal:     General: Bowel sounds are normal. There is no distension.     Palpations: Abdomen is soft.     Tenderness: There is no abdominal tenderness.  Musculoskeletal:        General: Normal range of motion.     Cervical back: Neck supple.     Right lower leg: No edema.     Left lower leg: No edema.   Lymphadenopathy:     Cervical: No cervical adenopathy.  Skin:    General: Skin is warm and dry.  Neurological:     Mental Status: She is alert. Mental status is at baseline.  Psychiatric:        Mood and Affect: Mood normal.      ASSESSMENT/ PLAN:  TODAY  Aortic atherosclerosis Chronic anemia Closed traumatic nondisplaced fracture of four ribs on left side subsequent encounter  Will continue current medications Will continue therapy as directed The goal of care is: assisted living.   Time spent with patient: 40 minutes: medications; therapy; dietary    Synthia Innocent NP [facility]  call [phone]

## 2024-01-04 NOTE — Progress Notes (Signed)
 Location:  [facility] Room Number: 134 Place of Service:  SNF (31)   CODE STATUS: DNR  Allergies  Allergen Reactions   Prevnar 13 [Pneumococcal 13-Val Conj Vacc] Hives    Red/streaking arm   Penicillins Hives   Tetanus Toxoids Other (See Comments)    Area of injection was red and streaked.   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 - retiredWalks daily - exercise   Close with her large church family   Still driving and very independent   Social Drivers of Corporate investment banker Strain: Low Risk  (12/15/2022)   Overall Financial Resource Strain (CARDIA)    Difficulty of Paying Living Expenses: Not hard at all  Food Insecurity: No Food Insecurity (12/16/2023)   Hunger Vital Sign    Worried About Running Out of Food in the Last Year: Never true    Ran Out of Food in the Last Year: Never true  Transportation Needs: No Transportation Needs (12/16/2023)   PRAPARE - Administrator, Civil Service (Medical): No    Lack of Transportation (Non-Medical): No  Physical Activity: Insufficiently Active (12/15/2022)   Exercise Vital Sign    Days of Exercise per Week: 3 days    Minutes of Exercise per Session: 30 min  Stress: No Stress Concern Present (12/15/2022)   [employer] of Occupational  Health - Occupational Stress Questionnaire    Feeling of Stress : Not at all  Social Connections: Moderately Integrated (12/16/2023)   Social Connection and Isolation Panel [NHANES]    Frequency of Communication with Friends and Family: More than three times a week    Frequency of Social Gatherings with Friends and Family: More than three times a week    Attends Religious Services: More than 4 times per year    Active Member of [employer] or Organizations: Yes    Attends Banker Meetings: More than 4 times per year    Marital Status: Widowed  Intimate Partner Violence: Not At Risk (12/16/2023)   Humiliation, Afraid, Rape, and Kick questionnaire    Fear of Current or Ex-Partner: No    Emotionally Abused: No    Physically Abused: No    Sexually Abused: No   Family History  Problem Relation Age of Onset   Healthy Mother    Lung cancer Father    Colon cancer Neg Hx    Stomach cancer Neg Hx    Esophageal cancer Neg Hx        VITAL SIGNS BP 120/82   Pulse 90   Temp (!) 97.5 F (36.4 C) (Temporal)   Resp 20   Ht 5' (1.524 m)   Wt 85 lb 9.6 oz (38.8 kg)   SpO2 100%   BMI 16.72 kg/m   Outpatient Encounter Medications as of 01/04/2024  Medication Sig   acetaminophen (TYLENOL) 325 MG tablet Take 2 tablets (650 mg total) by mouth every 6 (six) hours as needed for mild pain (or Fever >/= 101).   amLODipine (NORVASC) 5 MG tablet Take 1 tablet (5 mg total) by mouth daily.   SPIKEVAX syringe Inject 0.5 mLs into the muscle once.   Cholecalciferol (VITAMIN D) 2000 UNITS CAPS Take by mouth as directed. Take 1 tablet daily except none on saturdays and sundays   [employer]S VITAMIN C 500 MG tablet TAKE 1 TABLET (500 MG TOTAL) BY MOUTH DAILY.   docusate sodium (COLACE) 100 MG capsule Take 1 capsule (100 mg total) by mouth 2 (two) times daily.   Fe Fum-FePoly-Vit C-Vit B3 (INTEGRA) 62.5-62.5-40-3 MG CAPS TAKE 1 CAPSULE BY MOUTH EVERY DAY   GEMTESA 75 MG TABS Take 1 tablet by mouth daily.   lidocaine (LIDODERM) 5 % Place 1 patch onto the skin daily. Remove & Discard patch within 12 hours or as directed by MD   meloxicam (MOBIC) 7.5 MG tablet Take 7.5 mg by mouth daily.   methocarbamol (ROBAXIN) 500 MG tablet Take 1 tablet (500 mg total) by mouth every 8 (eight) hours as needed for muscle spasms.   metoCLOPramide (REGLAN) 10 MG tablet TAKE 1 TABLET BY MOUTH EVERYDAY AT BEDTIME   Omega-3 Fatty Acids (FISH OIL) 1000 MG CAPS Take 1 capsule by mouth 3 (three) times daily.    omeprazole (PRILOSEC) 20 MG capsule PLEASE MAKE A FOLLOW UP APPOINTMENT. (TAKE1-2 CAPSULES BY MOUTH DAILY(20-40MG )   oxyCODONE (OXY IR/ROXICODONE) 5 MG immediate release tablet Take 1 tablet (5 mg total) by mouth every 6 (six) hours as needed for moderate pain (pain score 4-6).   potassium chloride SA (KLOR-CON M) 20 MEQ tablet Take 1 tablet (20 mEq total) by mouth daily.   No facility-administered encounter medications on file as of 01/04/2024.      SIGNIFICANT DIAGNOSTIC EXAMS       ASSESSMENT/ PLAN:     Synthia Innocent NP [facility]  Contact [phone] Monday  through Friday 8am- 5pm  After hours call [phone]

## 2024-01-07 DIAGNOSIS — R262 Difficulty in walking, not elsewhere classified: Secondary | ICD-10-CM | POA: Diagnosis not present

## 2024-01-07 DIAGNOSIS — M6281 Muscle weakness (generalized): Secondary | ICD-10-CM | POA: Diagnosis not present

## 2024-01-07 DIAGNOSIS — Z9181 History of falling: Secondary | ICD-10-CM | POA: Diagnosis not present

## 2024-01-07 DIAGNOSIS — R488 Other symbolic dysfunctions: Secondary | ICD-10-CM | POA: Diagnosis not present

## 2024-01-08 DIAGNOSIS — R488 Other symbolic dysfunctions: Secondary | ICD-10-CM | POA: Diagnosis not present

## 2024-01-08 DIAGNOSIS — M6281 Muscle weakness (generalized): Secondary | ICD-10-CM | POA: Diagnosis not present

## 2024-01-08 DIAGNOSIS — R262 Difficulty in walking, not elsewhere classified: Secondary | ICD-10-CM | POA: Diagnosis not present

## 2024-01-08 DIAGNOSIS — Z9181 History of falling: Secondary | ICD-10-CM | POA: Diagnosis not present

## 2024-01-09 DIAGNOSIS — R262 Difficulty in walking, not elsewhere classified: Secondary | ICD-10-CM | POA: Diagnosis not present

## 2024-01-09 DIAGNOSIS — M6281 Muscle weakness (generalized): Secondary | ICD-10-CM | POA: Diagnosis not present

## 2024-01-09 DIAGNOSIS — Z9181 History of falling: Secondary | ICD-10-CM | POA: Diagnosis not present

## 2024-01-09 DIAGNOSIS — R488 Other symbolic dysfunctions: Secondary | ICD-10-CM | POA: Diagnosis not present

## 2024-01-10 DIAGNOSIS — R488 Other symbolic dysfunctions: Secondary | ICD-10-CM | POA: Diagnosis not present

## 2024-01-10 DIAGNOSIS — Z9181 History of falling: Secondary | ICD-10-CM | POA: Diagnosis not present

## 2024-01-10 DIAGNOSIS — R262 Difficulty in walking, not elsewhere classified: Secondary | ICD-10-CM | POA: Diagnosis not present

## 2024-01-10 DIAGNOSIS — M6281 Muscle weakness (generalized): Secondary | ICD-10-CM | POA: Diagnosis not present

## 2024-01-11 DIAGNOSIS — Z9181 History of falling: Secondary | ICD-10-CM | POA: Diagnosis not present

## 2024-01-11 DIAGNOSIS — R488 Other symbolic dysfunctions: Secondary | ICD-10-CM | POA: Diagnosis not present

## 2024-01-11 DIAGNOSIS — M6281 Muscle weakness (generalized): Secondary | ICD-10-CM | POA: Diagnosis not present

## 2024-01-11 DIAGNOSIS — R262 Difficulty in walking, not elsewhere classified: Secondary | ICD-10-CM | POA: Diagnosis not present

## 2024-01-14 DIAGNOSIS — R488 Other symbolic dysfunctions: Secondary | ICD-10-CM | POA: Diagnosis not present

## 2024-01-14 DIAGNOSIS — M6281 Muscle weakness (generalized): Secondary | ICD-10-CM | POA: Diagnosis not present

## 2024-01-14 DIAGNOSIS — K219 Gastro-esophageal reflux disease without esophagitis: Secondary | ICD-10-CM | POA: Diagnosis not present

## 2024-01-15 DIAGNOSIS — R488 Other symbolic dysfunctions: Secondary | ICD-10-CM | POA: Diagnosis not present

## 2024-01-15 DIAGNOSIS — K219 Gastro-esophageal reflux disease without esophagitis: Secondary | ICD-10-CM | POA: Diagnosis not present

## 2024-01-15 DIAGNOSIS — M6281 Muscle weakness (generalized): Secondary | ICD-10-CM | POA: Diagnosis not present

## 2024-01-18 ENCOUNTER — Encounter: Payer: Self-pay | Admitting: Adult Health

## 2024-01-18 NOTE — Progress Notes (Signed)
 Location:  [facility] Room Number: 133 Place of Service:  SNF (31)   CODE STATUS: dnr   Allergies  Allergen Reactions   Prevnar 13 [Pneumococcal 13-Val Conj Vacc] Hives    Red/streaking arm   Penicillins Hives   Tetanus Toxoids Other (See Comments)    Area of injection was red and streaked.   Codeine Nausea Only   Sulfonamide Derivatives Nausea Only    Chief Complaint  Patient presents with   Medical Management of Chronic Issues           Closed traumatic nondisplaced fracture of 4 ribs to the left: Primary hypertension: Urinary incontinence in female     HPI:  She is a 89 year old resident of this facility being seen for the management of her chronic illnesses:  Closed traumatic nondisplaced fracture of 4 ribs to the left: Primary hypertension: Urinary incontinence in female. She continues to participate in therapy. There are no reports of uncontrolled pain. No reports of cough or shortness of breath.   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 - exercise   Close with her large church family   Still driving and very independent   Social Drivers of Corporate investment banker Strain: Low Risk  (12/15/2022)   Overall Financial Resource Strain (CARDIA)    Difficulty of Paying Living Expenses: Not hard at all  Food Insecurity: No Food Insecurity (12/16/2023)   Hunger Vital Sign    Worried About Running Out of Food in the Last Year: Never true    Ran Out of Food in the Last Year: Never true  Transportation Needs: No Transportation Needs (  12/16/2023)   PRAPARE - Administrator, Civil Service (Medical): No    Lack of Transportation (Non-Medical): No  Physical Activity: Insufficiently Active (12/15/2022)   Exercise Vital Sign    Days of Exercise per Week: 3 days    Minutes of Exercise per Session: 30 min  Stress: No Stress Concern Present (12/15/2022)   [employer] of Occupational Health - Occupational Stress Questionnaire    Feeling of Stress : Not at all  Social Connections: Moderately Integrated (12/16/2023)   Social Connection and Isolation Panel [NHANES]    Frequency of Communication with Friends and Family: More than three times a week    Frequency of Social Gatherings with Friends and Family: More than three times a week    Attends Religious Services: More than 4 times per year    Active Member of [employer] or Organizations: Yes    Attends Banker Meetings: More than 4 times per year    Marital Status:  Widowed  Intimate Partner Violence: Not At Risk (12/16/2023)   Humiliation, Afraid, Rape, and Kick questionnaire    Fear of Current or Ex-Partner: No    Emotionally Abused: No    Physically Abused: No    Sexually Abused: No   Family History  Problem Relation Age of Onset   Healthy Mother    Lung cancer Father    Colon cancer Neg Hx    Stomach cancer Neg Hx    Esophageal cancer Neg Hx       VITAL SIGNS BP 135/80   Pulse 75   Temp (!) 97.4 F (36.3 C)   Resp 20   Ht 5' (1.524 m)   Wt 85 lb (38.6 kg)   SpO2 98%   BMI 16.60 kg/m   Outpatient Encounter Medications as of 01/19/2024  Medication Sig   acetaminophen (TYLENOL) 325 MG tablet Take 2 tablets (650 mg total) by mouth every 6 (six) hours as needed for mild pain (or Fever >/= 101).   amLODipine (NORVASC) 5 MG tablet Take 1 tablet (5 mg total) by mouth daily.   Cholecalciferol (VITAMIN D) 2000 UNITS CAPS Take by mouth as directed. Take 1 tablet daily except none on saturdays and sundays   CVS VITAMIN C 500 MG tablet TAKE 1 TABLET (500 MG TOTAL) BY MOUTH DAILY.   docusate sodium (COLACE) 100 MG capsule Take 1 capsule (100 mg total) by mouth 2 (two) times daily.   Fe Fum-FePoly-Vit C-Vit B3 (INTEGRA) 62.5-62.5-40-3 MG CAPS TAKE 1 CAPSULE BY MOUTH EVERY DAY   GEMTESA 75 MG TABS Take 1 tablet by mouth daily.   lidocaine (LIDODERM) 5 % Place 1 patch onto the skin daily. Remove & Discard patch within 12 hours or as directed by MD   meloxicam (MOBIC) 7.5 MG tablet Take 7.5 mg by mouth daily.   methocarbamol (ROBAXIN) 500 MG tablet Take 1 tablet (500 mg total) by mouth every 8 (eight) hours as needed for muscle spasms.   metoCLOPramide (REGLAN) 10 MG tablet TAKE 1 TABLET BY MOUTH EVERYDAY AT BEDTIME   Omega-3 Fatty Acids (FISH OIL) 1000 MG CAPS Take 1 capsule by mouth 3 (three) times daily.    omeprazole (PRILOSEC) 20 MG capsule PLEASE MAKE A FOLLOW UP APPOINTMENT. (TAKE1-2 CAPSULES BY MOUTH DAILY(20-40MG )   potassium chloride SA  (KLOR-CON M) 20 MEQ tablet Take 1 tablet (20 mEq total) by mouth daily.   SPIKEVAX syringe Inject 0.5 mLs into the muscle once.   No facility-administered encounter medications on  file as of 01/19/2024.     SIGNIFICANT DIAGNOSTIC EXAMS   TODAY  12-16-23: wbc 6.9; hgb 14.3; hct 41.8; mcv 93.5 plt 160; glucose 129; bun 11; creat 0.54 k+ 3.7; na++ 136; ca 9.5 gfr >60 protein 6.1 albumin 3.78   Review of Systems  Constitutional:  Negative for malaise/fatigue.  Respiratory:  Negative for cough and shortness of breath.   Cardiovascular:  Negative for chest pain, palpitations and leg swelling.  Gastrointestinal:  Negative for abdominal pain, constipation and heartburn.  Musculoskeletal:  Negative for back pain, joint pain and myalgias.  Skin: Negative.   Neurological:  Negative for dizziness.  Psychiatric/Behavioral:  The patient is not nervous/anxious.    Physical Exam Constitutional:      General: She is not in acute distress.    Appearance: She is well-developed. She is not diaphoretic.  Neck:     Thyroid: No thyromegaly.  Cardiovascular:     Rate and Rhythm: Normal rate and regular rhythm.     Heart sounds: Normal heart sounds.  Pulmonary:     Effort: Pulmonary effort is normal. No respiratory distress.     Breath sounds: Normal breath sounds.  Abdominal:     General: Bowel sounds are normal. There is no distension.     Palpations: Abdomen is soft.     Tenderness: There is no abdominal tenderness.  Musculoskeletal:        General: Normal range of motion.     Cervical back: Neck supple.     Right lower leg: No edema.     Left lower leg: No edema.  Lymphadenopathy:     Cervical: No cervical adenopathy.  Skin:    General: Skin is warm and dry.  Neurological:     Mental Status: She is alert. Mental status is at baseline.  Psychiatric:        Mood and Affect: Mood normal.      ASSESSMENT/ PLAN:  TODAY  Closed traumatic nondisplaced fracture of 4 ribs to the left: will  continue therapy as directed to improve upon her independence with her adls. She has a lidocaine patch to her ribs daily; has robaxin 500 mg every 8 hours as needed; mobic 7.5 mg daily   2. Primary hypertension: b/p 135/80: will continue norvasc 5 mg daily   3. Urinary incontinence in female: will continue gemesta 75 mg daily   NO NEW LABS.   4. Chronic anemia: will continue integra 62.5/62.5/40.5 daily   5. GERD without esophagitis: will continue prilosec 20 mg daily and reglan 10 mg nightly   6. Chronic constipation: will continue colace twice daily   7. Aortic atherosclerosis: (ct 08-27-23): is not on statin due to advanced age.    Synthia Innocent NP [facility]  call [phone]

## 2024-01-19 ENCOUNTER — Non-Acute Institutional Stay (SKILLED_NURSING_FACILITY): Payer: Self-pay | Admitting: Adult Health

## 2024-01-19 DIAGNOSIS — N3281 Overactive bladder: Secondary | ICD-10-CM | POA: Diagnosis not present

## 2024-01-19 DIAGNOSIS — S2242XA Multiple fractures of ribs, left side, initial encounter for closed fracture: Secondary | ICD-10-CM

## 2024-01-19 DIAGNOSIS — I10 Essential (primary) hypertension: Secondary | ICD-10-CM | POA: Diagnosis not present

## 2024-01-21 ENCOUNTER — Other Ambulatory Visit (HOSPITAL_COMMUNITY)
Admission: RE | Admit: 2024-01-21 | Discharge: 2024-01-21 | Disposition: A | Discharge status: Home / Self Care | Source: Skilled Nursing Facility | Attending: Adult Health | Admitting: Adult Health

## 2024-01-21 DIAGNOSIS — J1000 Influenza due to other identified influenza virus with unspecified type of pneumonia: Secondary | ICD-10-CM | POA: Insufficient documentation

## 2024-01-21 DIAGNOSIS — R6889 Other general symptoms and signs: Secondary | ICD-10-CM | POA: Diagnosis present

## 2024-01-21 LAB — RESP PANEL BY RT-PCR (RSV, FLU A&B, COVID)  RVPGX2
Influenza A by PCR: NEGATIVE
Influenza B by PCR: NEGATIVE
Resp Syncytial Virus by PCR: NEGATIVE
SARS Coronavirus 2 by RT PCR: NEGATIVE

## 2024-02-07 ENCOUNTER — Non-Acute Institutional Stay (SKILLED_NURSING_FACILITY): Admitting: Internal Medicine

## 2024-02-07 ENCOUNTER — Encounter: Payer: Self-pay | Admitting: Internal Medicine

## 2024-02-07 DIAGNOSIS — E440 Moderate protein-calorie malnutrition: Secondary | ICD-10-CM | POA: Diagnosis not present

## 2024-02-07 DIAGNOSIS — R488 Other symbolic dysfunctions: Secondary | ICD-10-CM | POA: Diagnosis not present

## 2024-02-07 DIAGNOSIS — R4189 Other symptoms and signs involving cognitive functions and awareness: Secondary | ICD-10-CM | POA: Diagnosis not present

## 2024-02-07 DIAGNOSIS — I499 Cardiac arrhythmia, unspecified: Secondary | ICD-10-CM | POA: Insufficient documentation

## 2024-02-07 DIAGNOSIS — K219 Gastro-esophageal reflux disease without esophagitis: Secondary | ICD-10-CM | POA: Diagnosis not present

## 2024-02-07 DIAGNOSIS — R29818 Other symptoms and signs involving the nervous system: Secondary | ICD-10-CM | POA: Diagnosis not present

## 2024-02-07 DIAGNOSIS — M6281 Muscle weakness (generalized): Secondary | ICD-10-CM | POA: Diagnosis not present

## 2024-02-07 NOTE — Progress Notes (Signed)
 NURSING HOME LOCATION:  [facility] ROOM NUMBER:  [id number] P  CODE STATUS:  DNR  PCP: Dettinger, Elige Radon, MD   This is a nursing facility follow up visit for specific acute issue of competency assessment..  Interim medical record and care since last SNF visit was updated with review of diagnostic studies and change in clinical status since last visit were documented.  HPI:  At this time her Power of Gerrit Friends has resigned because of her advanced age, specifically that individual was 89 years of age.  The question being explored is whether the patient is competent to handle her affairs or needs a new POA assigned or guardianship pursued.  She has had 3 MMSE's performed since her admission to this facility 12/17/2023.  Although these have revealed slight improvement in the scores ;she still is in the mildly demented range.   CT cns imaging 08/27/2023 had documented findings suggesting vascular dementia.  Specifically cerebral atrophy was present.  There was also moderate chronic small vessel ischemic changes within the cerebral white matter. In reference to her dementia, chart review reveals that her last TSH on record was 3.460 on 10/18/2021.  She is not on thyroid supplement and there is a past medical history of thyroidectomy.  Last B12 on record was 1250 on 07/27/2023.  She is not on B12 supplement at this time.  There is no VDRL/RPR on record.  She had been hospitalized 2/2 - 12/17/2023 with nondisplaced left posterior lateral rib fractures at level 9 - 12 sustained in a mechanical fall.  Review of systems: Dementia invalidated responses.   Word retrieval was slow.  She was able to correctly identify that she been hospitalized because of "fall and broke ribs." She indicates that she wants to stay at this facility.  She realizes her POA has resigned.  She states that Mr. Sampson [place] in [place] is to assume the position.  When asked if she were having active  issues her response was "not really well but well enough to suit me."  She also stated "they  got me" and reached for 2 envelopes containing hospital bills from [facility]. 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  When asked the retirement date she said "31" but then corrected to "70."  I asked whether 1957 or 2057 and she replied "2057."  Physical exam:  Pertinent or positive findings: She was initially asleep in the wheelchair exhibiting hypopnea without frank apnea or snoring.  She she roused easily.  Facies tend to be blank.  There is temporal wasting.  The lower lids are markedly puffy.  The right nasolabial fold is decreased.  The teeth are slightly stained with some malalignment of the mandibular teeth.  Breath sounds are decreased.  Heart rhythm is irregular but rate is adequately controlled.  Pedal pulses are decreased to palpation.  She has 1/2+ edema at the sock line.  Interosseous wasting is present along with limb atrophy.  She has a well-healed horizontal operative scar over the upper thorax anteriorly.  The veins are markedly prominent over the forearms and dorsum of the hands.  General appearance: no acute distress, increased work of breathing is present.   Lymphatic: No lymphadenopathy about the head, neck, axilla. Eyes: No conjunctival inflammation or lid edema is present. There is no scleral icterus. Ears:  External ear exam shows no significant lesions or deformities.  Nose:  External nasal examination shows no deformity or inflammation. Nasal mucosa are pink and moist without lesions, exudates Neck:  No thyromegaly, masses, tenderness noted.    Heart:  No gallop, murmur, click, rub .  Lungs:  without wheezes, rhonchi, rales, rubs. Abdomen: Bowel sounds are normal. Abdomen is soft and nontender with no organomegaly, hernias, masses. GU: Deferred  Extremities:  No cyanosis,  clubbing  Neurologic exam :Balance, Rhomberg, finger to nose testing could not be completed due to clinical state Skin: Warm & dry w/o tenting. No significant rash.  See summary under each active problem in the Problem List with associated updated therapeutic plan

## 2024-02-07 NOTE — Assessment & Plan Note (Addendum)
 Today her rhythm is clinically irregular but rate is adequately controlled.  The most recent EKG on record revealed sinus rhythm.  EKG will be rechecked to rule out new onset A-fib.  Additionally her TSH will be updated as there is a past medical history of thyroidectomy & she is not on thyroid supplementation.

## 2024-02-07 NOTE — Assessment & Plan Note (Signed)
 Although the total protein 6.1 on exam she exhibits protein/caloric malnutrition which is significant based on temporal wasting, interosseous wasting, and limb atrophy.

## 2024-02-07 NOTE — Assessment & Plan Note (Addendum)
 Again today she confabulated about having retired in " 2057." Long-term I would expect her dementia to progress resulting in inability for her to manage her affairs. Clinically at least pursuing guardianship and assisted-living placement long-term would be in her best interest. Update TSH & B12 levels and check VDRL.

## 2024-02-07 NOTE — Patient Instructions (Signed)
 See assessment and plan under each diagnosis in the problem list and acutely for this visit

## 2024-02-08 ENCOUNTER — Other Ambulatory Visit (HOSPITAL_COMMUNITY)
Admission: RE | Admit: 2024-02-08 | Discharge: 2024-02-08 | Disposition: A | Discharge status: Home / Self Care | Source: Skilled Nursing Facility | Attending: Adult Health | Admitting: Adult Health

## 2024-02-08 DIAGNOSIS — G3184 Mild cognitive impairment, so stated: Secondary | ICD-10-CM | POA: Diagnosis not present

## 2024-02-08 LAB — VITAMIN B12: Vitamin B-12: 293 pg/mL (ref 180–914)

## 2024-02-08 LAB — TSH: TSH: 3.083 u[IU]/mL (ref 0.350–4.500)

## 2024-02-09 LAB — RPR: RPR Ser Ql: NONREACTIVE

## 2024-02-19 ENCOUNTER — Encounter: Payer: Self-pay | Admitting: Internal Medicine

## 2024-02-20 ENCOUNTER — Encounter: Payer: Self-pay | Admitting: Adult Health

## 2024-02-20 ENCOUNTER — Non-Acute Institutional Stay (SKILLED_NURSING_FACILITY): Payer: Self-pay | Admitting: Adult Health

## 2024-02-20 DIAGNOSIS — K5909 Other constipation: Secondary | ICD-10-CM | POA: Diagnosis not present

## 2024-02-20 DIAGNOSIS — D649 Anemia, unspecified: Secondary | ICD-10-CM

## 2024-02-20 DIAGNOSIS — K219 Gastro-esophageal reflux disease without esophagitis: Secondary | ICD-10-CM

## 2024-02-20 NOTE — Progress Notes (Signed)
 Location:  [facility] Room Number: 133 Place of Service:  SNF (31)   CODE STATUS: dnr   Allergies  Allergen Reactions   Prevnar 13 [Pneumococcal 13-Val Conj Vacc] Hives    Red/streaking arm   Penicillins Hives   Tetanus Toxoids Other (See Comments)    Area of injection was red and streaked.   Codeine Nausea Only   Sulfonamide Derivatives Nausea Only    Chief Complaint  Patient presents with   Medical Management of Chronic Issues       Chronic anemia GERD without esophagitis  Chronic constipation      HPI:  She is a 89 y.o.  resident of this facility being seen for the management of her chronic illnesses: Chronic anemia GERD without esophagitis  Chronic constipation. There are no reports of uncontrolled pain. Her weight is stable. No reports of anxiety or depressive thoughts.    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 - and then [employer] x 15 yrs - retiredWalks daily - exercise   Close with her large church family   Still driving and very independent   Social Drivers of Corporate investment banker Strain: Low Risk  (12/15/2022)   Overall Financial Resource Strain (CARDIA)    Difficulty of Paying Living Expenses: Not hard at all  Food Insecurity: No Food Insecurity (12/16/2023)   Hunger Vital Sign    Worried About Running Out of Food in the Last Year: Never true    Ran Out of Food in the Last Year: Never true  Transportation Needs: No Transportation Needs (12/16/2023)   PRAPARE - Administrator, Civil Service (Medical): No    Lack of Transportation (Non-Medical): No  Physical Activity: Insufficiently Active (12/15/2022)   Exercise Vital Sign    Days of Exercise per Week: 3 days    Minutes of Exercise per Session: 30 min  Stress: No Stress Concern Present (12/15/2022)   [employer] of Occupational Health - Occupational Stress Questionnaire    Feeling of Stress : Not at all  Social Connections: Moderately Integrated (12/16/2023)   Social Connection and Isolation Panel [NHANES]    Frequency of Communication with Friends and Family: More than three times a week    Frequency of Social Gatherings with Friends and Family: More than three times a week    Attends Religious Services: More than 4 times per year    Active Member of [employer] or Organizations: Yes    Attends Banker Meetings: More than 4 times per year    Marital Status: Widowed  Intimate Partner Violence: Not At Risk (12/16/2023)   Humiliation, Afraid, Rape, and Kick questionnaire    Fear of Current  or Ex-Partner: No    Emotionally Abused: No    Physically Abused: No    Sexually Abused: No   Family History  Problem Relation Age of Onset   Healthy Mother    Lung cancer Father    Colon cancer Neg Hx    Stomach cancer Neg Hx    Esophageal cancer Neg Hx       VITAL SIGNS BP (!) 112/59   Pulse 74   Temp 98.2 F (36.8 C)   Resp 20   Ht 5' (1.524 m)   Wt 87 lb 3.2 oz (39.6 kg)   SpO2 96%   BMI 17.03 kg/m   Outpatient Encounter Medications as of 02/20/2024  Medication Sig   acetaminophen (TYLENOL) 325 MG tablet Take 2 tablets (650 mg total) by mouth every 6 (six) hours as needed for mild pain (or Fever >/= 101).   amLODipine (NORVASC) 5 MG tablet Take 1 tablet (5 mg total) by mouth daily.   Cholecalciferol (VITAMIN D) 2000 UNITS CAPS Take by mouth as directed. Take 1 tablet daily except none on saturdays and sundays   [employer] VITAMIN C 500 MG tablet TAKE 1 TABLET (500 MG TOTAL) BY MOUTH DAILY.   docusate sodium (COLACE) 100 MG capsule Take 1 capsule (100 mg total) by mouth 2 (two) times daily.   Fe Fum-FePoly-Vit C-Vit B3 (INTEGRA) 62.5-62.5-40-3 MG CAPS TAKE 1 CAPSULE BY MOUTH EVERY DAY   GEMTESA 75 MG TABS Take 1 tablet by mouth daily.   lidocaine (LIDODERM) 5 % Place 1 patch onto the skin daily. Remove & Discard patch within 12 hours or as directed by MD   meloxicam (MOBIC) 7.5 MG tablet Take 7.5 mg by mouth daily.   methocarbamol (ROBAXIN) 500 MG tablet Take 1 tablet (500 mg total) by mouth every 8 (eight) hours as needed for muscle spasms.   metoCLOPramide (REGLAN) 10 MG tablet TAKE 1 TABLET BY MOUTH EVERYDAY AT BEDTIME   Omega-3 Fatty Acids (FISH OIL) 1000 MG CAPS Take 1 capsule by mouth 3 (three) times daily.    omeprazole (PRILOSEC) 20 MG capsule PLEASE MAKE A FOLLOW UP APPOINTMENT. (TAKE1-2 CAPSULES BY MOUTH DAILY(20-40MG )   potassium chloride SA (KLOR-CON M) 20 MEQ tablet Take 1 tablet (20 mEq total) by mouth daily.   SPIKEVAX syringe Inject 0.5 mLs into the muscle once.    No facility-administered encounter medications on file as of 02/20/2024.     SIGNIFICANT DIAGNOSTIC EXAMS  TODAY  12-16-23: wbc 6.9; hgb 14.3; hct 41.8;  mcv 93.5 plt 160; glucose 129; bun 11; creat 0.54 k+ 3.7; na++ 136; ca 9.5 gfr >60 protein 6.1 albumin 3.78  02-08-24: tsh 3.083; RPR: nr   Review of Systems  Constitutional:  Negative for malaise/fatigue.  Respiratory:  Negative for cough and shortness of breath.   Cardiovascular:  Negative for chest pain, palpitations and leg swelling.  Gastrointestinal:  Negative for abdominal pain, constipation and heartburn.  Musculoskeletal:  Negative for back pain, joint pain and myalgias.  Skin: Negative.   Neurological:  Negative for dizziness.  Psychiatric/Behavioral:  The patient is not nervous/anxious.    Physical Exam Constitutional:      General: She is not in acute distress.    Appearance: She is well-developed. She is not diaphoretic.  Neck:     Thyroid: No thyromegaly.  Cardiovascular:     Rate and Rhythm: Normal rate and regular rhythm.     Heart sounds: Normal heart sounds.  Pulmonary:     Effort: Pulmonary effort is normal. No respiratory distress.     Breath sounds: Normal breath sounds.  Abdominal:     General: Bowel sounds are normal. There is no distension.     Palpations: Abdomen is soft.     Tenderness: There is no abdominal tenderness.  Musculoskeletal:        General: Normal range of motion.     Cervical back: Neck supple.     Right lower leg: No edema.     Left lower leg: No edema.  Lymphadenopathy:     Cervical: No cervical adenopathy.  Skin:    General: Skin is warm and dry.  Neurological:     Mental Status: She is alert. Mental status is at baseline.  Psychiatric:        Mood and Affect: Mood normal.      ASSESSMENT/ PLAN:  TODAY   Chronic anemia Hgb 14.3 will continue integra 62.5/62.5/40.5 daily   GERD without esophagitis Is currently stable: will continue prilosec 20 mg daily and reglan 10  mg nightly   Chronic constipation Will continue colace twice daily   PREVIOUS   4.  Aortic atherosclerosis: (ct 08-27-23): is not on statin due to advanced age.   5. Closed traumatic nondisplaced fracture of 4 ribs to the left: will continue therapy as directed to improve upon her independence with her adls. She has a lidocaine patch to her ribs daily; has robaxin 500 mg every 8 hours as needed; mobic 7.5 mg daily   6. Primary hypertension: b/p 112/59: will continue norvasc 5 mg daily   7. Urinary incontinence in female: will continue gemesta 75 mg daily    Britt Candle NP [facility]  call [phone]

## 2024-02-26 NOTE — Assessment & Plan Note (Signed)
 Hgb 14.3 will continue integra 62.5/62.5/40.5 daily

## 2024-02-26 NOTE — Assessment & Plan Note (Signed)
 Is currently stable: will continue prilosec 20 mg daily and reglan 10 mg nightly

## 2024-02-26 NOTE — Assessment & Plan Note (Signed)
Will continue colace twice daily 

## 2024-02-27 DIAGNOSIS — L602 Onychogryphosis: Secondary | ICD-10-CM | POA: Diagnosis not present

## 2024-02-27 DIAGNOSIS — I739 Peripheral vascular disease, unspecified: Secondary | ICD-10-CM | POA: Diagnosis not present

## 2024-02-27 DIAGNOSIS — L603 Nail dystrophy: Secondary | ICD-10-CM | POA: Diagnosis not present

## 2024-04-02 ENCOUNTER — Non-Acute Institutional Stay (SKILLED_NURSING_FACILITY): Admitting: Adult Health

## 2024-04-02 ENCOUNTER — Encounter: Payer: Self-pay | Admitting: Adult Health

## 2024-04-02 DIAGNOSIS — Z0000 Encounter for general adult medical examination without abnormal findings: Secondary | ICD-10-CM

## 2024-04-02 NOTE — Patient Instructions (Signed)
  Ms. Sillas , Thank you for taking time to come for your Medicare Wellness Visit. I appreciate your ongoing commitment to your health goals. Please review the following plan we discussed and let me know if I can assist you in the future.   These are the goals we discussed:  Goals      DIET - INCREASE LEAN PROTEINS     Exercise 150 minutes per week (moderate activity)     Great job - keep up daily walking        This is a list of the screening recommended for you and due dates:  Health Maintenance  Topic Date Due   Flu Shot  06/13/2024   COVID-19 Vaccine (8 - 2024-25 season) 08/13/2024   Medicare Annual Wellness Visit  04/02/2025   Pneumonia Vaccine  Completed   DEXA scan (bone density measurement)  Completed   Zoster (Shingles) Vaccine  Completed   HPV Vaccine  Aged Out   Meningitis B Vaccine  Aged Out   DTaP/Tdap/Td vaccine  Discontinued   Mammogram  Discontinued

## 2024-04-02 NOTE — Progress Notes (Signed)
 Subjective:   Kristi Sawyer is a 89 y.o. female who presents for Medicare Annual (Subsequent) preventive examination.  Visit Complete: In person  Patient Medicare AWV questionnaire was completed by the patient on 04-02-2024; I have confirmed that all information answered by patient is correct and no changes since this date.  Cardiac Risk Factors include: advanced age (>76men, >57 women);hypertension;sedentary lifestyle     Objective:     Today's Vitals   04/02/24 1131  BP: 122/69  Pulse: 87  Resp: 16  Temp: 99 F (37.2 C)  SpO2: 95%  Weight: 90 lb 6.4 oz (41 kg)  Height: 5' (1.524 m)   Body mass index is 17.66 kg/m.     01/04/2024   12:49 PM 12/16/2023    7:45 AM 08/26/2023   10:04 PM 07/29/2023   10:44 PM 07/26/2023    6:35 PM 12/15/2022   10:32 AM 10/19/2022    6:56 PM  Advanced Directives  Does Patient Have a Medical Advance Directive? Yes Yes No  No Yes Yes  Type of Advance Directive Living will;Healthcare Power of Attorney;Out of facility DNR (pink MOST or yellow form) Living will;Healthcare Power of [employer] [place];Living will Living will  Does patient want to make changes to medical advance directive?  No - Patient declined     No - Patient declined  Copy of Healthcare Power of Attorney in Chart? No - copy requested No - copy requested    No - copy requested   Would patient like information on creating a medical advance directive?   No - Patient declined No - Guardian declined No - Guardian declined      Current Medications (verified) Outpatient Encounter Medications as of 04/02/2024  Medication Sig   acetaminophen  (TYLENOL ) 325 MG tablet Take 2 tablets (650 mg total) by mouth every 6 (six) hours as needed for mild pain (or Fever >/= 101).   amLODipine  (NORVASC ) 5 MG tablet Take 1 tablet (5 mg total) by mouth daily.   Cholecalciferol  (VITAMIN D ) 2000 UNITS CAPS Take by mouth as directed. Take 1 tablet daily except none on saturdays and  sundays   [facility] VITAMIN C  500 MG tablet TAKE 1 TABLET (500 MG TOTAL) BY MOUTH DAILY.   docusate sodium  (COLACE) 100 MG capsule Take 1 capsule (100 mg total) by mouth 2 (two) times daily.   Fe Fum-FePoly-Vit C-Vit B3 (INTEGRA) 62.5-62.5-40-3 MG CAPS TAKE 1 CAPSULE BY MOUTH EVERY DAY   GEMTESA 75 MG TABS Take 1 tablet by mouth daily.   meloxicam (MOBIC) 7.5 MG tablet Take 7.5 mg by mouth daily.   methocarbamol  (ROBAXIN ) 500 MG tablet Take 1 tablet (500 mg total) by mouth every 8 (eight) hours as needed for muscle spasms.   metoCLOPramide  (REGLAN ) 10 MG tablet TAKE 1 TABLET BY MOUTH EVERYDAY AT BEDTIME   omeprazole  (PRILOSEC) 20 MG capsule PLEASE MAKE A FOLLOW UP APPOINTMENT. (TAKE1-2 CAPSULES BY MOUTH DAILY(20-40MG )   potassium chloride  SA (KLOR-CON  M) 20 MEQ tablet Take 1 tablet (20 mEq total) by mouth daily.   lidocaine  (LIDODERM ) 5 % Place 1 patch onto the skin daily. Remove & Discard patch within 12 hours or as directed by MD (Patient not taking: Reported on 04/02/2024)   Omega-3 Fatty Acids (FISH OIL) 1000 MG CAPS Take 1 capsule by mouth 3 (three) times daily.  (Patient not taking: Reported on 04/02/2024)   SPIKEVAX syringe Inject 0.5 mLs into the muscle once. (Patient not taking: Reported on 04/02/2024)  No facility-administered encounter medications on file as of 04/02/2024.    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 benign tumor    Family History  Problem Relation Age of Onset   Healthy Mother    Lung cancer Father    Colon cancer Neg Hx    Stomach cancer Neg Hx    Esophageal cancer Neg Hx    Social History   Socioeconomic History   Marital status: Widowed    Spouse name: Not on file   Number of children: 0   Years of education: 16   Highest education level: Bachelor's degree (e.g., BA, AB, BS)  Occupational History   Occupation: retired    Associate Professor: RETIRED  Tobacco Use   Smoking status: Never   Smokeless tobacco: Never  Vaping Use   Vaping status: Never Used  Substance and Sexual Activity   Alcohol use: No   Drug use: No   Sexual activity: Not Currently  Other Topics Concern   Not on file  Social History Narrative   WidowNo 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 - and then DMV x 15 yrs - retiredWalks daily - exercise   Close with her large church family   Still driving and very independent   Social Drivers of Corporate investment banker Strain: Low Risk  (12/15/2022)   Overall Financial Resource Strain (CARDIA)    Difficulty of Paying Living Expenses: Not hard at all  Food Insecurity: No Food Insecurity (12/16/2023)   Hunger Vital Sign    Worried About Running Out of Food in the Last Year: Never true    Ran Out of Food in the Last Year: Never true  Transportation Needs: No Transportation Needs (12/16/2023)   PRAPARE - Administrator, Civil Service (Medical): No    Lack of Transportation (Non-Medical): No  Physical Activity: Insufficiently Active (12/15/2022)   Exercise Vital Sign    Days of Exercise per Week: 3 days    Minutes of Exercise per  Session: 30 min  Stress: No Stress Concern Present (12/15/2022)   [employer] of Occupational Health - Occupational Stress Questionnaire    Feeling of Stress : Not at all  Social Connections: Moderately Integrated (12/16/2023)   Social Connection and Isolation Panel [NHANES]    Frequency of Communication with Friends and Family: More than three times a  week    Frequency of Social Gatherings with Friends and Family: More than three times a week    Attends Religious Services: More than 4 times per year    Active Member of Clubs or Organizations: Yes    Attends Banker Meetings: More than 4 times per year    Marital Status: Widowed    Tobacco Counseling Counseling given: Not Answered   Clinical Intake:  Pre-visit preparation completed: Yes  Pain : No/denies pain     BMI - recorded: 17.66 Nutritional Status: BMI <19  Underweight Nutritional Risks: Unintentional weight loss, Failure to thrive Diabetes: No  How often do you need to have someone help you when you read instructions, pamphlets, or other written materials from your doctor or pharmacy?: 5 - Always  Interpreter Needed?: No  Comments: long term resident of SNF   Activities of Daily Living    04/02/2024    2:02 PM 12/16/2023    7:45 AM  In your present state of health, do you have any difficulty performing the following activities:  Hearing? 0 0  Vision? 0 0  Difficulty concentrating or making decisions? 1 0  Walking or climbing stairs? 1   Dressing or bathing? 1   Doing errands, shopping? 1 0  Preparing Food and eating ? Y   Using the Toilet? Y   In the past six months, have you accidently leaked urine? Y   Do you have problems with loss of bowel control? Y   Managing your Medications? Y   Managing your Finances? Y   Housekeeping or managing your Housekeeping? Y     Patient Care Team: Dettinger, Lucio Sabin, MD as PCP - General (Family Medicine) Kenney Peacemaker, MD as Consulting Physician  (Gastroenterology) Seward Dao Alleen Arbour, MD as Consulting Physician (Ophthalmology) Homero Luster, MD as Attending Physician (Urology) Denman Fischer, MD (Dermatology) Alexia Idler, OD (Optometry)  Indicate any recent Medical Services you may have received from other than [facility] providers in the past year (date may be approximate).     Assessment:    This is a routine wellness examination for Zanyah.  Hearing/Vision screen No results found.   Goals Addressed             This Visit's Progress    DIET - INCREASE LEAN PROTEINS   Not on track    Exercise 150 minutes per week (moderate activity)   Not on track    Great job - keep up daily walking       Depression Screen    04/02/2024    1:59 PM 09/11/2023    4:21 PM 08/09/2023    2:37 PM 05/29/2023   11:54 AM 04/26/2023   11:59 AM 04/18/2023    1:17 PM 03/07/2023    3:05 PM  PHQ 2/9 Scores  PHQ - 2 Score 0 0 0 0 0 0 0  PHQ- 9 Score 0 0  0 0 0     Fall Risk    04/02/2024    1:59 PM 09/20/2023   11:02 AM 09/11/2023    4:21 PM 08/09/2023    2:37 PM 05/29/2023   11:54 AM  Fall Risk   Falls in the past year? 1 1 1 1 1   Number falls in past yr: 1 1 1 1 1   Injury with Fall? 1 1 1 1 1   Comment  Laceration     Risk for fall due to : History of fall(s);Impaired balance/gait;Impaired mobility History of fall(s);Impaired balance/gait Impaired  balance/gait;History of fall(s);Impaired mobility Impaired balance/gait;Impaired mobility;History of fall(s) Impaired balance/gait;Impaired mobility  Follow up  Falls prevention discussed Falls evaluation completed Falls evaluation completed Falls evaluation completed    MEDICARE RISK AT HOME: Medicare Risk at Home Any stairs in or around the home?: Yes If so, are there any without handrails?: No Home free of loose throw rugs in walkways, pet beds, electrical cords, etc?: Yes Adequate lighting in your home to reduce risk of falls?: Yes Life alert?: No Use of a cane, walker or w/c?: Yes Grab bars in  the bathroom?: Yes Shower chair or bench in shower?: Yes Elevated toilet seat or a handicapped toilet?: Yes  TIMED UP AND GO:  Was the test performed?  No    Cognitive Function:    04/02/2024    2:03 PM 05/01/2018   11:44 AM 04/16/2017    2:16 PM 04/14/2016   12:02 PM 04/02/2015   11:25 AM  MMSE - Mini Mental State Exam  Not completed: Unable to complete      Orientation to time  5 5 5 5   Orientation to Place  5 5 5 5   Registration  3  3 3   Attention/ Calculation  4 3 5 4   Recall  2  3 2   Language- name 2 objects  2 2 2 2   Language- repeat  1 1 1 1   Language- follow 3 step command  2 3 3 3   Language- read & follow direction  1 1 1 1   Write a sentence  1 1 1 1   Copy design  1 1 1 1   Total score  27  30 28         12/15/2022   10:33 AM 12/14/2021   12:14 PM 12/07/2020   10:52 AM 05/05/2019    2:19 PM  6CIT Screen  What Year? 0 points 0 points 0 points 0 points  What month? 0 points 0 points 0 points 0 points  What time? 0 points 0 points 0 points 0 points  Count back from 20 0 points 0 points 0 points 0 points  Months in reverse 0 points 0 points 4 points 0 points  Repeat phrase 0 points 4 points 4 points 0 points  Total Score 0 points 4 points 8 points 0 points    Immunizations Immunization History  Administered Date(s) Administered   Fluad Quad(high Dose 65+) 07/14/2019, 08/18/2020, 08/26/2021   Fluad Trivalent(High Dose 65+) 07/28/2023   Influenza, High Dose Seasonal PF 08/07/2017, 09/11/2018   Influenza,inj,Quad PF,6+ Mos 08/04/2013, 08/18/2014, 09/16/2015, 08/22/2016   Influenza-Unspecified 08/11/2009, 08/04/2010, 09/13/2011, 09/16/2015   Moderna Covid-19 Fall Seasonal Vaccine 80yrs & older 08/14/2023   Moderna Sars-Covid-2 Vaccination 02/12/2024   PFIZER(Purple Top)SARS-COV-2 Vaccination 12/05/2019, 12/26/2019, 03/25/2020, 04/11/2021, 08/12/2021   Pneumococcal Conjugate-13 11/05/2013   Pneumococcal Polysaccharide-23 11/14/1995   Tdap 06/03/2013   Unspecified  SARS-COV-2 Vaccination 08/24/2022   Zoster Recombinant(Shingrix ) 04/20/2021, 04/17/2022   Zoster, Live 12/27/2010    TDAP status: Up to date  Flu Vaccine status: Up to date  Pneumococcal vaccine status: Up to date  Covid-19 vaccine status: Completed vaccines  Qualifies for Shingles Vaccine? No   Zostavax completed Yes   Shingrix  Completed?: No.    Education has been provided regarding the importance of this vaccine. Patient has been advised to call insurance company to determine out of pocket expense if they have not yet received this vaccine. Advised may also receive vaccine at local pharmacy or Health Dept. Verbalized acceptance and understanding.  Screening Tests  Health Maintenance  Topic Date Due   INFLUENZA VACCINE  06/13/2024   COVID-19 Vaccine (8 - 2024-25 season) 08/13/2024   Medicare Annual Wellness (AWV)  04/02/2025   Pneumonia Vaccine 57+ Years old  Completed   DEXA SCAN  Completed   Zoster Vaccines- Shingrix   Completed   HPV VACCINES  Aged Out   Meningococcal B Vaccine  Aged Out   DTaP/Tdap/Td  Discontinued   MAMMOGRAM  Discontinued    Health Maintenance  There are no preventive care reminders to display for this patient.  Colorectal cancer screening: No longer required.   Mammogram status: No longer required due to age.    Lung Cancer Screening: (Low Dose CT Chest recommended if Age 89-80 years, 20 pack-year currently smoking OR have quit w/in 15years.) does not qualify.   Lung Cancer Screening Referral:   Additional Screening:  Hepatitis C Screening: does not qualify; Completed   Vision Screening: Recommended annual ophthalmology exams for early detection of glaucoma and other disorders of the eye. Is the patient up to date with their annual eye exam?  No  Who is the provider or what is the name of the office in which the patient attends annual eye exams?  If pt is not established with a provider, would they like to be referred to a provider to  establish care? No .   Dental Screening: Recommended annual dental exams for proper oral hygiene  Diabetic Foot Exam:   Community Resource Referral / Chronic Care Management: CRR required this visit?  No   CCM required this visit?  No     Plan:     I have personally reviewed and noted the following in the patient's chart:   Medical and social history Use of alcohol, tobacco or illicit drugs  Current medications and supplements including opioid prescriptions. Patient is not currently taking opioid prescriptions. Functional ability and status Nutritional status Physical activity Advanced directives List of other physicians Hospitalizations, surgeries, and ER visits in previous 12 months Vitals Screenings to include cognitive, depression, and falls Referrals and appointments  In addition, I have reviewed and discussed with patient certain preventive protocols, quality metrics, and best practice recommendations. A written personalized care plan for preventive services as well as general preventive health recommendations were provided to patient.     Marilyne Shu, NP   04/02/2024   After Visit Summary: (In Person-Declined) Patient declined AVS at this time.  Nurse Notes: this exam was performed by myself at this facility

## 2024-04-09 ENCOUNTER — Encounter: Payer: Self-pay | Admitting: Internal Medicine

## 2024-04-09 ENCOUNTER — Non-Acute Institutional Stay (SKILLED_NURSING_FACILITY): Payer: Self-pay | Admitting: Internal Medicine

## 2024-04-09 DIAGNOSIS — R29818 Other symptoms and signs involving the nervous system: Secondary | ICD-10-CM

## 2024-04-09 DIAGNOSIS — Z9889 Other specified postprocedural states: Secondary | ICD-10-CM

## 2024-04-09 DIAGNOSIS — E440 Moderate protein-calorie malnutrition: Secondary | ICD-10-CM | POA: Diagnosis not present

## 2024-04-09 DIAGNOSIS — R4189 Other symptoms and signs involving cognitive functions and awareness: Secondary | ICD-10-CM

## 2024-04-09 DIAGNOSIS — I700 Atherosclerosis of aorta: Secondary | ICD-10-CM

## 2024-04-09 DIAGNOSIS — I10 Essential (primary) hypertension: Secondary | ICD-10-CM | POA: Diagnosis not present

## 2024-04-09 NOTE — Patient Instructions (Signed)
 See assessment and plan under each diagnosis in the problem list and acutely for this visit

## 2024-04-09 NOTE — Progress Notes (Unsigned)
 NURSING HOME LOCATION:  [facility] ROOM NUMBER:  [id number]  CODE STATUS:  DNR  PCP: Dettinger, Lucio Sabin, MD   This is a nursing facility follow up visit for of chronic medical diagnoses to document compliance with Regulation 483.30 (c) in The Long Term Care Survey Manual Phase 2 which mandates caregiver visit ( visits can alternate among physician, PA or NP as per statutes) within 10 days of 30 days / 60 days/ 90 days post admission to SNF date  .  Interim medical record and care since last SNF visit was updated with review of diagnostic studies and change in clinical status since last visit were documented.  HPI: She is a permanent resident of this facility with medical diagnoses of osteoporosis, hypermobility syndrome, GERD with history of esophageal stricture, history of anemia, history of celiac disease/sprue, and dyslipidemia. There is a recorded history of thyroidectomy for benign thyroid  tumor in 1975. Review of labs indicates low normal B12 level without associated anemia.  Although total protein is low at 6.1; albumin is normal at 3.8.  Although she has a history of thyroidectomy; TSH was therapeutic at 3.083 on 02/08/2024 without L-thyroxine supplementation listed in Epic med list.  This would suggest that she had had a partial thyroidectomy rather than total thyroidectomy.  Review of systems: Dementia invalidated responses.  She could not identify her PCP PTA to SNF.  She denied having had any thyroid  surgery.  When I asked her about the horizontal op scar @ the anterior inferior neck area ; she has no explanation.  Review of systems were generally negative except for validation of anxiety and depression "as usual."  Constitutional: No fever, significant weight change, fatigue  Eyes: No redness, discharge, pain, vision change ENT/mouth: No nasal congestion,  purulent discharge, earache, change in hearing, sore throat  Cardiovascular: No chest pain, palpitations,  paroxysmal nocturnal dyspnea, edema  Respiratory: No cough, sputum production, hemoptysis, DOE, significant snoring, apnea   Gastrointestinal: No heartburn, dysphagia, abdominal pain, nausea /vomiting, rectal bleeding, melena, change in bowels Genitourinary: No dysuria, hematuria, pyuria, incontinence, nocturia Musculoskeletal: No joint stiffness, joint swelling, weakness, pain Dermatologic: No rash, pruritus, change in appearance of skin Neurologic: No dizziness, headache, syncope, seizures, numbness, tingling Psychiatric: No  insomnia, anorexia Endocrine: No change in hair/skin/nails, excessive thirst, excessive hunger, excessive urination  Hematologic/lymphatic: No significant bruising, lymphadenopathy, abnormal bleeding Allergy/immunology: No itchy/watery eyes, significant sneezing, urticaria, angioedema  Physical exam:  Pertinent or positive findings: She is thin and appears suboptimally nourished.  Pattern of speech is somewhat halting and hoarse.  There is some dental staining and malalignment.  Slight gallop cadence was suggested.  Dorsalis pedis pulse is stronger than the posterior tibial pulses.  She has trace edema at the sock line.  Limb atrophy and interosseous wasting is present.  General appearance:  no acute distress, increased work of breathing is present.   Lymphatic: No lymphadenopathy about the head, neck, axilla. Eyes: No conjunctival inflammation or lid edema is present. There is no scleral icterus. Ears:  External ear exam shows no significant lesions or deformities.   Nose:  External nasal examination shows no deformity or inflammation. Nasal mucosa are pink and moist without lesions, exudates Oral exam:  Lips and gums are healthy appearing. There is no oropharyngeal erythema or exudate. Neck:  No thyromegaly, masses, tenderness noted.    Heart:  No murmur, click, rub .  Lungs: Chest clear to auscultation without wheezes, rhonchi, rales, rubs. Abdomen: Bowel sounds are  normal. Abdomen is soft and nontender with no organomegaly, hernias, masses. GU: Deferred  Extremities:  No cyanosis, clubbing  Neurologic exam :Balance, Rhomberg, finger to nose testing could not be completed due to clinical state Deep tendon reflexes are equal Skin: Warm & dry w/o tenting. No significant lesions or rash.  See summary under each active problem in the Problem List with associated updated therapeutic plan

## 2024-04-10 ENCOUNTER — Encounter: Payer: Self-pay | Admitting: Adult Health

## 2024-04-10 DIAGNOSIS — Z9889 Other specified postprocedural states: Secondary | ICD-10-CM | POA: Insufficient documentation

## 2024-04-10 NOTE — Progress Notes (Unsigned)
 Location:  [facility] Room Number: 130 Place of Service:  SNF (31)   CODE STATUS: dnr   Allergies  Allergen Reactions   Prevnar 13 [Pneumococcal 13-Val Conj Vacc] Hives    Red/streaking arm   Penicillins Hives   Tetanus Toxoids Other (See Comments)    Area of injection was red and streaked.   Codeine Nausea Only   Sulfonamide Derivatives Nausea Only    Chief Complaint  Patient presents with   Acute Visit    Care plan meeting     HPI:  We have come together for her care plan meeting. BIMS 14/15  mood 0/30. She uses wheelchair with no falls. She requires independent assist with her adl care. She is frequently incontinent of bladder and bowel. Dietary: regular gluten free; weight is 90.4 pounds 12.7% in 6 months; setup for meals . Therapy: none at this time. 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 - and then [employer] 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 yrs - retiredWalks daily - exercise   Close with her large church family   Still driving and very independent   Social Drivers of Corporate investment banker Strain: Low Risk  (12/15/2022)   Overall Financial Resource Strain (CARDIA)    Difficulty of Paying Living Expenses: Not hard at all  Food Insecurity: No Food Insecurity (12/16/2023)   Hunger Vital Sign    Worried About Running Out of Food in the Last Year: Never true    Ran Out of Food in the Last Year: Never true  Transportation Needs: No Transportation Needs (12/16/2023)  PRAPARE - Administrator, Civil Service (Medical): No    Lack of Transportation (Non-Medical): No  Physical Activity: Insufficiently Active (12/15/2022)   Exercise Vital Sign    Days of Exercise per Week: 3 days    Minutes of Exercise per Session: 30 min  Stress: No Stress Concern Present (12/15/2022)   [employer] of Occupational Health - Occupational Stress Questionnaire    Feeling of Stress : Not at all  Social Connections: Moderately Integrated (12/16/2023)   Social Connection and Isolation Panel [NHANES]    Frequency of Communication with Friends and Family: More than three times a week    Frequency of Social Gatherings with Friends and Family: More than three times a week    Attends Religious Services: More than 4 times per year    Active Member of [employer] or Organizations: Yes    Attends Banker Meetings: More than 4 times per year    Marital  Status: Widowed  Intimate Partner Violence: Not At Risk (12/16/2023)   Humiliation, Afraid, Rape, and Kick questionnaire    Fear of Current or Ex-Partner: No    Emotionally Abused: No    Physically Abused: No    Sexually Abused: No   Family History  Problem Relation Age of Onset   Healthy Mother    Lung cancer Father    Colon cancer Neg Hx    Stomach cancer Neg Hx    Esophageal cancer Neg Hx       VITAL SIGNS BP 129/70   Pulse 88   Temp 97.6 F (36.4 C)   Resp 20   Ht 5' (1.524 m)   Wt 90 lb 6.4 oz (41 kg)   SpO2 97%   BMI 17.66 kg/m   Outpatient Encounter Medications as of 04/11/2024  Medication Sig   acetaminophen  (TYLENOL ) 325 MG tablet Take 2 tablets (650 mg total) by mouth every 6 (six) hours as needed for mild pain (or Fever >/= 101).   amLODipine  (NORVASC ) 5 MG tablet Take 1 tablet (5 mg total) by mouth daily.   Cholecalciferol  (VITAMIN D ) 2000 UNITS CAPS Take by mouth as directed. Take 1 tablet daily except none on saturdays and sundays   [facility] VITAMIN C  500 MG tablet TAKE 1 TABLET (500 MG TOTAL) BY MOUTH DAILY.   docusate sodium  (COLACE) 100 MG capsule Take 1 capsule (100 mg total) by mouth 2 (two) times daily.   Fe Fum-FePoly-Vit C-Vit B3 (INTEGRA) 62.5-62.5-40-3 MG CAPS TAKE 1 CAPSULE BY MOUTH EVERY DAY   GEMTESA 75 MG TABS Take 1 tablet by mouth daily.   lidocaine  (LIDODERM ) 5 % Place 1 patch onto the skin daily. Remove & Discard patch within 12 hours or as directed by MD (Patient not taking: Reported on 04/02/2024)   meloxicam (MOBIC) 7.5 MG tablet Take 7.5 mg by mouth daily.   methocarbamol  (ROBAXIN ) 500 MG tablet Take 1 tablet (500 mg total) by mouth every 8 (eight) hours as needed for muscle spasms.   metoCLOPramide  (REGLAN ) 10 MG tablet TAKE 1 TABLET BY MOUTH EVERYDAY AT BEDTIME   Omega-3 Fatty Acids (FISH OIL) 1000 MG CAPS Take 1 capsule by mouth 3 (three) times daily.  (Patient not taking: Reported on 04/02/2024)   omeprazole  (PRILOSEC) 20 MG capsule PLEASE  MAKE A FOLLOW UP APPOINTMENT. (TAKE1-2 CAPSULES BY MOUTH DAILY(20-40MG )   potassium chloride  SA (KLOR-CON  M) 20 MEQ tablet Take 1 tablet (20 mEq total) by mouth daily.   SPIKEVAX syringe Inject 0.5 mLs into  the muscle once. (Patient not taking: Reported on 04/02/2024)   No facility-administered encounter medications on file as of 04/11/2024.     SIGNIFICANT DIAGNOSTIC EXAMS  TODAY  12-16-23: wbc 6.9; hgb 14.3; hct 41.8; mcv 93.5 plt 160; glucose 129; bun 11; creat 0.54 k+ 3.7; na++ 136; ca 9.5 gfr >60 protein 6.1 albumin 3.78  02-08-24: tsh 3.083; RPR: nr   Review of Systems  Constitutional:  Negative for malaise/fatigue.  Respiratory:  Negative for cough and shortness of breath.   Cardiovascular:  Negative for chest pain, palpitations and leg swelling.  Gastrointestinal:  Negative for abdominal pain, constipation and heartburn.  Musculoskeletal:  Negative for back pain, joint pain and myalgias.  Skin: Negative.   Neurological:  Negative for dizziness.  Psychiatric/Behavioral:  The patient is not nervous/anxious.    Physical Exam Constitutional:      General: She is not in acute distress.    Appearance: She is underweight. She is not diaphoretic.  Neck:     Thyroid : No thyromegaly.  Cardiovascular:     Rate and Rhythm: Normal rate and regular rhythm.     Pulses: Normal pulses.     Heart sounds: Normal heart sounds.  Pulmonary:     Effort: Pulmonary effort is normal. No respiratory distress.     Breath sounds: Normal breath sounds.  Abdominal:     General: Bowel sounds are normal. There is no distension.     Palpations: Abdomen is soft.     Tenderness: There is no abdominal tenderness.  Musculoskeletal:        General: Normal range of motion.     Cervical back: Neck supple.     Right lower leg: No edema.     Left lower leg: No edema.  Lymphadenopathy:     Cervical: No cervical adenopathy.  Skin:    General: Skin is warm and dry.  Neurological:     Mental Status: She is  alert. Mental status is at baseline.  Psychiatric:        Mood and Affect: Mood normal.     ASSESSMENT/ PLAN:  TODAY  Aortic atherosclerosis Neurocognitive deficits GERD without esophagitis:   Will continue current medications Will continue current plan of care Will continue to monitor her status.   Time spent with patient: 40 minutes: medications; dietary; plan of care.    Britt Candle NP [facility]  call [phone]

## 2024-04-10 NOTE — Assessment & Plan Note (Signed)
 Attempt to verify with Dr. Steen Eden whether she indeed had thyroidectomy and whether she had been on thyroid  supplementation.  TSH will be repeated in mid-late June 2025 to assess need for L-thyroxine supplementation.

## 2024-04-10 NOTE — Assessment & Plan Note (Addendum)
 Limb atrophy and interosseous wasting are significant. Nutritionist to monitor at [facility].

## 2024-04-10 NOTE — Assessment & Plan Note (Signed)
 She denies any anginal equivalent.  Continue to monitor.

## 2024-04-10 NOTE — Assessment & Plan Note (Addendum)
 03/20/2024 she was unable to identify her PCP PTA to [facility].  She denied having had thyroid  surgery or taking a thyroid  supplement.  She cannot explain the horizontal scar at the anterior inferior neck area.   Guardianship is to be pursued in the court on May 01, 2024.

## 2024-04-10 NOTE — Assessment & Plan Note (Signed)
 BP controlled; no change in antihypertensive medications

## 2024-04-11 ENCOUNTER — Non-Acute Institutional Stay (SKILLED_NURSING_FACILITY): Payer: Self-pay | Admitting: Adult Health

## 2024-04-11 DIAGNOSIS — K219 Gastro-esophageal reflux disease without esophagitis: Secondary | ICD-10-CM | POA: Diagnosis not present

## 2024-04-11 DIAGNOSIS — I700 Atherosclerosis of aorta: Secondary | ICD-10-CM

## 2024-04-11 DIAGNOSIS — R29818 Other symptoms and signs involving the nervous system: Secondary | ICD-10-CM

## 2024-04-11 DIAGNOSIS — R4189 Other symptoms and signs involving cognitive functions and awareness: Secondary | ICD-10-CM

## 2024-04-24 DIAGNOSIS — G3184 Mild cognitive impairment, so stated: Secondary | ICD-10-CM | POA: Diagnosis not present

## 2024-04-24 DIAGNOSIS — R488 Other symbolic dysfunctions: Secondary | ICD-10-CM | POA: Diagnosis not present

## 2024-04-25 DIAGNOSIS — R488 Other symbolic dysfunctions: Secondary | ICD-10-CM | POA: Diagnosis not present

## 2024-04-25 DIAGNOSIS — G3184 Mild cognitive impairment, so stated: Secondary | ICD-10-CM | POA: Diagnosis not present

## 2024-04-28 DIAGNOSIS — R488 Other symbolic dysfunctions: Secondary | ICD-10-CM | POA: Diagnosis not present

## 2024-04-28 DIAGNOSIS — I739 Peripheral vascular disease, unspecified: Secondary | ICD-10-CM | POA: Diagnosis not present

## 2024-04-28 DIAGNOSIS — L603 Nail dystrophy: Secondary | ICD-10-CM | POA: Diagnosis not present

## 2024-04-28 DIAGNOSIS — G3184 Mild cognitive impairment, so stated: Secondary | ICD-10-CM | POA: Diagnosis not present

## 2024-04-29 ENCOUNTER — Other Ambulatory Visit (HOSPITAL_COMMUNITY)
Admission: RE | Admit: 2024-04-29 | Discharge: 2024-04-29 | Disposition: A | Discharge status: Home / Self Care | Source: Skilled Nursing Facility | Attending: Adult Health | Admitting: Adult Health

## 2024-04-29 DIAGNOSIS — E039 Hypothyroidism, unspecified: Secondary | ICD-10-CM | POA: Insufficient documentation

## 2024-04-29 DIAGNOSIS — R488 Other symbolic dysfunctions: Secondary | ICD-10-CM | POA: Diagnosis not present

## 2024-04-29 DIAGNOSIS — G3184 Mild cognitive impairment, so stated: Secondary | ICD-10-CM | POA: Diagnosis not present

## 2024-04-29 LAB — T4, FREE: Free T4: 1.18 ng/dL — ABNORMAL HIGH (ref 0.61–1.12)

## 2024-04-29 LAB — TSH: TSH: 3.184 u[IU]/mL (ref 0.350–4.500)

## 2024-04-30 DIAGNOSIS — G3184 Mild cognitive impairment, so stated: Secondary | ICD-10-CM | POA: Diagnosis not present

## 2024-04-30 DIAGNOSIS — H353131 Nonexudative age-related macular degeneration, bilateral, early dry stage: Secondary | ICD-10-CM | POA: Diagnosis not present

## 2024-04-30 DIAGNOSIS — R488 Other symbolic dysfunctions: Secondary | ICD-10-CM | POA: Diagnosis not present

## 2024-04-30 DIAGNOSIS — Z961 Presence of intraocular lens: Secondary | ICD-10-CM | POA: Diagnosis not present

## 2024-05-01 DIAGNOSIS — G3184 Mild cognitive impairment, so stated: Secondary | ICD-10-CM | POA: Diagnosis not present

## 2024-05-01 DIAGNOSIS — R488 Other symbolic dysfunctions: Secondary | ICD-10-CM | POA: Diagnosis not present

## 2024-05-09 ENCOUNTER — Non-Acute Institutional Stay (SKILLED_NURSING_FACILITY): Admitting: Adult Health

## 2024-05-09 ENCOUNTER — Encounter: Payer: Self-pay | Admitting: Adult Health

## 2024-05-09 DIAGNOSIS — R4189 Other symptoms and signs involving cognitive functions and awareness: Secondary | ICD-10-CM | POA: Diagnosis not present

## 2024-05-09 DIAGNOSIS — I700 Atherosclerosis of aorta: Secondary | ICD-10-CM | POA: Diagnosis not present

## 2024-05-09 DIAGNOSIS — E440 Moderate protein-calorie malnutrition: Secondary | ICD-10-CM

## 2024-05-09 DIAGNOSIS — R29818 Other symptoms and signs involving the nervous system: Secondary | ICD-10-CM | POA: Diagnosis not present

## 2024-05-09 NOTE — Progress Notes (Signed)
 Location:  [facility] Room Number: 133-P Place of Service:  SNF (31)   CODE STATUS: DNR  Allergies  Allergen Reactions   Prevnar 13 [Pneumococcal 13-Val Conj Vacc] Hives    Red/streaking arm   Penicillins Hives   Tetanus Toxoids Other (See Comments)    Area of injection was red and streaked.   Codeine Nausea Only   Sulfonamide Derivatives Nausea Only    Chief Complaint  Patient presents with   Acute Visit    Care plan meeting.    HPI:  We have come together for her care plan meeting. BIMS 13/15 mood 3/30: nervous at times. BCAT 34/50. She is using a wheelchair without falls. She requires independent to setup assist for her adl care. She is frequently incontinent of bladder and bowel. Dietary: regular gluten free diet: setup for meals; appetite good weight is 89.6 pounds up 6.7% in 60 days. Therapy: none at this time. Activities: does not participate. 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 - and then [employer] 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 - retiredWalks daily - exercise   Close with her large church family   Still driving and very independent   Social Drivers of Corporate investment banker Strain: Low Risk  (12/15/2022)   Overall Financial Resource Strain (CARDIA)    Difficulty of Paying Living Expenses: Not hard at all  Food Insecurity: No Food Insecurity (12/16/2023)   Hunger Vital Sign    Worried About Running Out of Food in the Last Year: Never true    Ran Out of Food in the Last  Year: Never true  Transportation Needs: No Transportation Needs (12/16/2023)   PRAPARE - Administrator, Civil Service (Medical): No    Lack of Transportation (Non-Medical): No  Physical Activity: Insufficiently Active (12/15/2022)   Exercise Vital Sign    Days of Exercise per Week: 3 days    Minutes of Exercise per Session: 30 min  Stress: No Stress Concern Present (12/15/2022)   [employer] of Occupational Health - Occupational Stress Questionnaire    Feeling of Stress : Not at all  Social Connections: Moderately Integrated (12/16/2023)   Social Connection and Isolation Panel    Frequency of Communication with Friends and Family: More than three times a week    Frequency of Social Gatherings with Friends and Family: More than three times a week    Attends Religious Services: More than 4 times per year    Active Member of [employer] or  Organizations: Yes    Attends Banker Meetings: More than 4 times per year    Marital Status: Widowed  Intimate Partner Violence: Not At Risk (12/16/2023)   Humiliation, Afraid, Rape, and Kick questionnaire    Fear of Current or Ex-Partner: No    Emotionally Abused: No    Physically Abused: No    Sexually Abused: No   Family History  Problem Relation Age of Onset   Healthy Mother    Lung cancer Father    Colon cancer Neg Hx    Stomach cancer Neg Hx    Esophageal cancer Neg Hx       VITAL SIGNS BP 127/68   Pulse 70   Temp (!) 97.5 F (36.4 C)   Resp 20   Ht 5' (1.524 m)   Wt 89 lb 9.6 oz (40.6 kg)   SpO2 96%   BMI 17.50 kg/m   Outpatient Encounter Medications as of 05/09/2024  Medication Sig   acetaminophen  (TYLENOL ) 325 MG tablet Take 2 tablets (650 mg total) by mouth every 6 (six) hours as needed for mild pain (or Fever >/= 101).   amLODipine  (NORVASC ) 5 MG tablet Take 1 tablet (5 mg total) by mouth daily.   Cholecalciferol  (VITAMIN D ) 2000 UNITS CAPS Take by mouth as directed. Take 1 tablet daily except none on saturdays and sundays   [facility]S VITAMIN C  500 MG tablet TAKE 1 TABLET (500 MG TOTAL) BY MOUTH DAILY.   docusate sodium  (COLACE) 100 MG capsule Take 1 capsule (100 mg total) by mouth 2 (two) times daily.   Fe Fum-FePoly-Vit C-Vit B3 (INTEGRA) 62.5-62.5-40-3 MG CAPS TAKE 1 CAPSULE BY MOUTH EVERY DAY   GEMTESA 75 MG TABS Take 1 tablet by mouth daily.   meloxicam (MOBIC) 7.5 MG tablet Take 7.5 mg by mouth daily.   metoCLOPramide  (REGLAN ) 10 MG tablet TAKE 1 TABLET BY MOUTH EVERYDAY AT BEDTIME   metoCLOPramide  (REGLAN ) 5 MG tablet Take 5 mg by mouth at bedtime.   omeprazole  (PRILOSEC) 20 MG capsule PLEASE MAKE A FOLLOW UP APPOINTMENT. (TAKE1-2 CAPSULES BY MOUTH DAILY(20-40MG )   omeprazole  (PRILOSEC) 20 MG capsule Take 20 mg by mouth daily.   potassium chloride  SA (KLOR-CON  M) 20 MEQ tablet Take 1 tablet (20 mEq total) by mouth daily.   lidocaine  (LIDODERM ) 5  % Place 1 patch onto the skin daily. Remove & Discard patch within 12 hours or as directed by MD (Patient not taking: Reported on 05/09/2024)   methocarbamol  (ROBAXIN ) 500 MG tablet Take 1 tablet (500 mg total) by mouth every 8 (eight) hours  as needed for muscle spasms. (Patient not taking: Reported on 05/09/2024)   Omega-3 Fatty Acids (FISH OIL) 1000 MG CAPS Take 1 capsule by mouth 3 (three) times daily.  (Patient not taking: Reported on 05/09/2024)   SPIKEVAX syringe Inject 0.5 mLs into the muscle once. (Patient not taking: Reported on 05/09/2024)   No facility-administered encounter medications on file as of 05/09/2024.     SIGNIFICANT DIAGNOSTIC EXAMS  TODAY  12-16-23: wbc 6.9; hgb 14.3; hct 41.8; mcv 93.5 plt 160; glucose 129; bun 11; creat 0.54 k+ 3.7; na++ 136; ca 9.5 gfr >60 protein 6.1 albumin 3.78  02-08-24: tsh 3.083; RPR: nr  Review of Systems  Constitutional:  Negative for malaise/fatigue.  Respiratory:  Negative for cough and shortness of breath.   Cardiovascular:  Negative for chest pain, palpitations and leg swelling.  Gastrointestinal:  Negative for abdominal pain, constipation and heartburn.  Musculoskeletal:  Negative for back pain, joint pain and myalgias.  Skin: Negative.   Neurological:  Negative for dizziness.  Psychiatric/Behavioral:  The patient is not nervous/anxious.    Physical Exam Constitutional:      General: She is not in acute distress.    Appearance: She is well-developed. She is not diaphoretic.  Neck:     Thyroid : No thyromegaly.   Cardiovascular:     Rate and Rhythm: Normal rate and regular rhythm.     Pulses: Normal pulses.     Heart sounds: Normal heart sounds.  Pulmonary:     Effort: Pulmonary effort is normal. No respiratory distress.     Breath sounds: Normal breath sounds.  Abdominal:     General: Bowel sounds are normal. There is no distension.     Palpations: Abdomen is soft.     Tenderness: There is no abdominal tenderness.    Musculoskeletal:        General: Normal range of motion.     Cervical back: Neck supple.     Right lower leg: No edema.     Left lower leg: No edema.  Lymphadenopathy:     Cervical: No cervical adenopathy.   Skin:    General: Skin is warm and dry.   Neurological:     Mental Status: She is alert. Mental status is at baseline.   Psychiatric:        Mood and Affect: Mood normal.      ASSESSMENT/ PLAN:  TODAY  Aortic atherosclerosis Neurocognitive deficits Moderate protein calorie malnutrition  Will continue current medications Will continue current plan of care Will continue to monitor her status.   Time spent with patient: 40 minutes: medications; activities; dietary; plan of care.    Barnie Seip NP [facility]  call [phone]

## 2024-05-12 ENCOUNTER — Encounter: Payer: Self-pay | Admitting: Adult Health

## 2024-05-12 ENCOUNTER — Non-Acute Institutional Stay (SKILLED_NURSING_FACILITY): Payer: Self-pay | Admitting: Adult Health

## 2024-05-12 DIAGNOSIS — I10 Essential (primary) hypertension: Secondary | ICD-10-CM

## 2024-05-12 DIAGNOSIS — R32 Unspecified urinary incontinence: Secondary | ICD-10-CM | POA: Diagnosis not present

## 2024-05-12 DIAGNOSIS — I700 Atherosclerosis of aorta: Secondary | ICD-10-CM

## 2024-05-12 NOTE — Progress Notes (Signed)
 Location:  [facility] Room Number: [id number] Place of Service:  SNF (31)   CODE STATUS: dnr   Allergies  Allergen Reactions   Prevnar 13 [Pneumococcal 13-Val Conj Vacc] Hives    Red/streaking arm   Penicillins Hives   Tetanus Toxoids Other (See Comments)    Area of injection was red and streaked.   Codeine Nausea Only   Sulfonamide Derivatives Nausea Only    Chief Complaint  Patient presents with   Medical Management of Chronic Issues            Aortic atherosclerosis: Primary hypertension: Urinary incontinence in female:    HPI:  She is a 89 y.o. long term resident of this facility being seen for the management of her chronic illnesses:Aortic atherosclerosis: Primary hypertension: Urinary incontinence in female: there are no reports of uncontrolled pain. There are no reports anxiety or agitation present.    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 - and then [employer] 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 yrs - retiredWalks daily - exercise   Close with her large church family   Still driving and very independent   Social Drivers of Corporate investment banker Strain: Low Risk  (12/15/2022)   Overall Financial Resource Strain (CARDIA)    Difficulty of Paying Living Expenses: Not hard at all  Food Insecurity: No Food Insecurity (12/16/2023)   Hunger Vital Sign    Worried About Running Out of Food in the Last Year: Never true    Ran Out of Food in the Last Year: Never true  Transportation Needs: No Transportation Needs (12/16/2023)   PRAPARE - Administrator, Civil Service (Medical): No    Lack of Transportation (Non-Medical): No  Physical Activity: Insufficiently Active (12/15/2022)   Exercise Vital Sign    Days of Exercise per Week: 3 days    Minutes of Exercise per Session: 30 min  Stress: No Stress Concern Present (12/15/2022)   [employer] of Occupational Health - Occupational Stress Questionnaire    Feeling of Stress : Not at all  Social Connections: Moderately Integrated (12/16/2023)   Social Connection and Isolation Panel    Frequency of Communication with Friends and Family: More than three times a week    Frequency of Social Gatherings with Friends and Family: More than three times a week    Attends Religious Services: More than 4 times per year    Active Member of [employer] or Organizations: Yes    Attends Banker Meetings: More than 4 times per year    Marital Status: Widowed  Intimate Partner Violence: Not At Risk (12/16/2023)   Humiliation, Afraid, Rape,  and Kick questionnaire    Fear of Current or Ex-Partner: No    Emotionally Abused: No    Physically Abused: No    Sexually Abused: No   Family History  Problem Relation Age of Onset   Healthy Mother    Lung cancer Father    Colon cancer Neg Hx    Stomach cancer Neg Hx    Esophageal cancer Neg Hx       VITAL SIGNS BP (!) 120/59   Pulse 68   Temp 98.3 F (36.8 C)   Resp 18   Ht 5' (1.524 m)   Wt 89 lb 9.6 oz (40.6 kg)   SpO2 99%   BMI 17.50 kg/m   Outpatient Encounter Medications as of 05/12/2024  Medication Sig   acetaminophen  (TYLENOL ) 325 MG tablet Take 2 tablets (650 mg total) by mouth every 6 (six) hours as needed for mild pain (or Fever >/= 101).   amLODipine  (NORVASC ) 5 MG tablet Take 1 tablet (5 mg total) by mouth daily.   Cholecalciferol  (VITAMIN D ) 2000 UNITS CAPS Take by mouth as directed. Take 1 tablet daily except none on saturdays and sundays   CVS VITAMIN C  500 MG tablet TAKE 1 TABLET (500 MG TOTAL) BY MOUTH DAILY.   docusate sodium  (COLACE) 100 MG capsule Take 1 capsule (100 mg total) by mouth 2 (two) times daily.   Fe Fum-FePoly-Vit C-Vit B3 (INTEGRA) 62.5-62.5-40-3 MG CAPS TAKE 1 CAPSULE BY MOUTH EVERY DAY   GEMTESA 75 MG TABS Take 1 tablet by mouth daily.   meloxicam (MOBIC) 7.5 MG tablet Take 7.5 mg by mouth daily.   metoCLOPramide  (REGLAN ) 5 MG tablet Take 5 mg by mouth at bedtime.   omeprazole  (PRILOSEC) 20 MG capsule Take 20 mg by mouth daily.   potassium chloride  SA (KLOR-CON  M) 20 MEQ tablet Take 1 tablet (20 mEq total) by mouth daily.   No facility-administered encounter medications on file as of 05/12/2024.     SIGNIFICANT DIAGNOSTIC EXAMS  TODAY  12-16-23: wbc 6.9; hgb 14.3; hct 41.8; mcv 93.5 plt 160; glucose 129; bun 11; creat 0.54 k+ 3.7; na++ 136; ca 9.5 gfr >60 protein 6.1 albumin 3.78  02-08-24: tsh 3.083; RPR: nr   Review of Systems  Constitutional:  Negative for malaise/fatigue.  Respiratory:  Negative for cough and shortness of  breath.   Cardiovascular:  Negative for chest pain, palpitations and leg swelling.  Gastrointestinal:  Negative for abdominal pain, constipation and heartburn.  Musculoskeletal:  Negative for back pain, joint pain and myalgias.  Skin: Negative.  Neurological:  Negative for dizziness.  Psychiatric/Behavioral:  The patient is not nervous/anxious.    Physical Exam Constitutional:      General: She is not in acute distress.    Appearance: She is well-developed. She is not diaphoretic.  Neck:     Thyroid : No thyromegaly.  Cardiovascular:     Rate and Rhythm: Normal rate and regular rhythm.     Heart sounds: Normal heart sounds.  Pulmonary:     Effort: Pulmonary effort is normal. No respiratory distress.     Breath sounds: Normal breath sounds.  Abdominal:     General: Bowel sounds are normal. There is no distension.     Palpations: Abdomen is soft.     Tenderness: There is no abdominal tenderness.  Musculoskeletal:        General: Normal range of motion.     Cervical back: Neck supple.     Right lower leg: No edema.     Left lower leg: No edema.  Lymphadenopathy:     Cervical: No cervical adenopathy.  Skin:    General: Skin is warm and dry.  Neurological:     Mental Status: She is alert. Mental status is at baseline.  Psychiatric:        Mood and Affect: Mood normal.         ASSESSMENT/ PLAN:  TODAY  Aortic atherosclerosis: (ct 08-27-23): is not on statin due to advanced age  36. Primary hypertension: b/p: 120/59 will continue norvasc  5 mg daily   3. Urinary incontinence in female: will continue gemesta 75 mg daily    PREVIOUS   4.Chronic anemia Hgb 14.3 will continue integra 62.5/62.5/40.5 every other day    5. GERD without esophagitis Is currently stable: will continue prilosec 20 mg daily and reglan  5 mg nightly   6. Chronic constipation Will continue colace twice daily       Barnie Seip NP [facility]  call [phone]

## 2024-06-10 ENCOUNTER — Non-Acute Institutional Stay (SKILLED_NURSING_FACILITY): Payer: Self-pay | Admitting: Adult Health

## 2024-06-10 DIAGNOSIS — R29818 Other symptoms and signs involving the nervous system: Secondary | ICD-10-CM | POA: Diagnosis not present

## 2024-06-10 DIAGNOSIS — D649 Anemia, unspecified: Secondary | ICD-10-CM

## 2024-06-10 DIAGNOSIS — K219 Gastro-esophageal reflux disease without esophagitis: Secondary | ICD-10-CM

## 2024-06-10 DIAGNOSIS — R4189 Other symptoms and signs involving cognitive functions and awareness: Secondary | ICD-10-CM

## 2024-06-11 ENCOUNTER — Encounter: Payer: Self-pay | Admitting: Adult Health

## 2024-06-11 NOTE — Progress Notes (Signed)
 Location:  [facility] Room Number: [id number] Place of Service:  SNF (31)   CODE STATUS: dnr   Allergies  Allergen Reactions   Prevnar 13 [Pneumococcal 13-Val Conj Vacc] Hives    Red/streaking arm   Penicillins Hives   Tetanus Toxoids Other (See Comments)    Area of injection was red and streaked.   Codeine Nausea Only   Sulfonamide Derivatives Nausea Only    Chief Complaint  Patient presents with   Medical Management of Chronic Issues         Neurocognitive deficits:    Chronic anemia:    GERD without esophagitis    HPI:  She is a 89 y.o. long term resident of this facility being seen for the management of her chronic illnesses: Neurocognitive deficits:    Chronic anemia:    GERD without esophagitis. There are no reports of uncontrolled pain. Her hgb is stable; there are no reports of heart burn.    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 - retiredWalks daily - exercise   Close with her large church family   Still driving and very independent   Social Drivers of Corporate investment banker Strain: Low Risk  (12/15/2022)   Overall Financial Resource Strain (CARDIA)    Difficulty of Paying Living Expenses: Not hard at all  Food Insecurity: No Food Insecurity (12/16/2023)   Hunger Vital Sign    Worried About Running Out of Food in the Last Year: Never true    Ran Out of Food in the Last Year: Never true  Transportation Needs: No Transportation Needs (12/16/2023)   PRAPARE - Transportation    Lack of Transportation (  Medical): No    Lack of Transportation (Non-Medical): No  Physical Activity: Insufficiently Active (12/15/2022)   Exercise Vital Sign    Days of Exercise per Week: 3 days    Minutes of Exercise per Session: 30 min  Stress: No Stress Concern Present (12/15/2022)   [employer] of Occupational Health - Occupational Stress Questionnaire    Feeling of Stress : Not at all  Social Connections: Moderately Integrated (12/16/2023)   Social Connection and Isolation Panel    Frequency of Communication with Friends and Family: More than three times a week    Frequency of Social Gatherings with Friends and Family: More than three times a week    Attends Religious Services: More than 4 times per year    Active Member of [employer] or Organizations: Yes    Attends Banker Meetings: More than 4 times per year    Marital Status: Widowed  Intimate Partner Violence: Not At Risk (12/16/2023)   Humiliation, Afraid, Rape, and Kick questionnaire     Fear of Current or Ex-Partner: No    Emotionally Abused: No    Physically Abused: No    Sexually Abused: No   Family History  Problem Relation Age of Onset   Healthy Mother    Lung cancer Father    Colon cancer Neg Hx    Stomach cancer Neg Hx    Esophageal cancer Neg Hx       VITAL SIGNS BP 124/65   Pulse (!) 52   Temp 97.6 F (36.4 C)   Resp 20   Ht 5' (1.524 m)   Wt 90 lb 12.8 oz (41.2 kg)   SpO2 99%   BMI 17.73 kg/m   Outpatient Encounter Medications as of 06/10/2024  Medication Sig   acetaminophen  (TYLENOL ) 325 MG tablet Take 2 tablets (650 mg total) by mouth every 6 (six) hours as needed for mild pain (or Fever >/= 101).   amLODipine  (NORVASC ) 5 MG tablet Take 1 tablet (5 mg total) by mouth daily.   Cholecalciferol  (VITAMIN D ) 2000 UNITS CAPS Take by mouth as directed. Take 1 tablet daily except none on saturdays and sundays   [employer]S VITAMIN C  500 MG tablet TAKE 1 TABLET (500 MG TOTAL) BY MOUTH DAILY.   docusate sodium  (COLACE) 100 MG capsule Take 1 capsule (100 mg total) by mouth 2 (two) times daily.   Fe Fum-FePoly-Vit C-Vit B3 (INTEGRA) 62.5-62.5-40-3 MG CAPS TAKE 1 CAPSULE BY MOUTH EVERY DAY   GEMTESA 75 MG TABS Take 1 tablet by mouth daily.   meloxicam (MOBIC) 7.5 MG tablet Take 7.5 mg by mouth daily.   metoCLOPramide  (REGLAN ) 5 MG tablet Take 5 mg by mouth at bedtime.   omeprazole  (PRILOSEC) 20 MG capsule Take 20 mg by mouth daily.   potassium chloride  SA (KLOR-CON  M) 20 MEQ tablet Take 1 tablet (20 mEq total) by mouth daily.   No facility-administered encounter medications on file as of 06/10/2024.     SIGNIFICANT DIAGNOSTIC EXAMS  TODAY  12-16-23: wbc 6.9; hgb 14.3; hct 41.8; mcv 93.5 plt 160; glucose 129; bun 11; creat 0.54 k+ 3.7; na++ 136; ca 9.5 gfr >60 protein 6.1 albumin 3.78  02-08-24: tsh 3.083; RPR: nr vitamin B12: 293 04-29-24: tsh 3.184; free t4: 1.18   Review of Systems  Constitutional:  Negative for malaise/fatigue.  Respiratory:  Negative  for cough and shortness of breath.   Cardiovascular:  Negative for chest pain, palpitations and leg swelling.  Gastrointestinal:  Negative for abdominal  pain, constipation and heartburn.  Musculoskeletal:  Negative for back pain, joint pain and myalgias.  Skin: Negative.   Neurological:  Negative for dizziness.  Psychiatric/Behavioral:  The patient is not nervous/anxious.     Physical Exam Constitutional:      General: She is not in acute distress.    Appearance: She is underweight. She is not diaphoretic.  Neck:     Thyroid : No thyromegaly.  Cardiovascular:     Rate and Rhythm: Normal rate and regular rhythm.     Pulses: Normal pulses.     Heart sounds: Normal heart sounds.  Pulmonary:     Effort: Pulmonary effort is normal. No respiratory distress.     Breath sounds: Normal breath sounds.  Abdominal:     General: Bowel sounds are normal. There is no distension.     Palpations: Abdomen is soft.     Tenderness: There is no abdominal tenderness.  Musculoskeletal:        General: Normal range of motion.     Cervical back: Neck supple.     Right lower leg: No edema.     Left lower leg: No edema.  Lymphadenopathy:     Cervical: No cervical adenopathy.  Skin:    General: Skin is warm and dry.  Neurological:     Mental Status: She is alert. Mental status is at baseline.  Psychiatric:        Mood and Affect: Mood normal.       ASSESSMENT/ PLAN:  TODAY  Neurocognitive deficits: BCAT on 04-24-24: 34/50  2. Chronic anemia: hgb 14.3; will continue intregra 62.5/62.5/40.5 every other day   3. GERD without esophagitis: will continue prilosec 20 mg daily and reglan  5 mg nightly      PREVIOUS   4. Chronic constipation Will continue colace twice daily   5. Aortic atherosclerosis: (ct 08-27-23): is not on statin due to advanced age  64. Primary hypertension: b/p: 124/65 will continue norvasc  5 mg daily   7. Urinary incontinence in female: will continue gemesta 75 mg daily      Barnie Seip NP [facility]   call [phone]

## 2024-06-24 ENCOUNTER — Encounter: Payer: Self-pay | Admitting: Internal Medicine

## 2024-06-24 ENCOUNTER — Non-Acute Institutional Stay (SKILLED_NURSING_FACILITY): Admitting: Internal Medicine

## 2024-06-24 DIAGNOSIS — I700 Atherosclerosis of aorta: Secondary | ICD-10-CM

## 2024-06-24 DIAGNOSIS — I10 Essential (primary) hypertension: Secondary | ICD-10-CM | POA: Diagnosis not present

## 2024-06-24 DIAGNOSIS — I499 Cardiac arrhythmia, unspecified: Secondary | ICD-10-CM

## 2024-06-24 DIAGNOSIS — K219 Gastro-esophageal reflux disease without esophagitis: Secondary | ICD-10-CM

## 2024-06-24 DIAGNOSIS — Z9889 Other specified postprocedural states: Secondary | ICD-10-CM | POA: Diagnosis not present

## 2024-06-24 NOTE — Assessment & Plan Note (Addendum)
 Rhythm is irregular due to pauses; rate is well-controlled. Last EKG 08/26/23: SR wo pauses.

## 2024-06-24 NOTE — Assessment & Plan Note (Signed)
She denies any anginal equivalent. 

## 2024-06-24 NOTE — Assessment & Plan Note (Signed)
 BP controlled; no change in antihypertensive medications

## 2024-06-24 NOTE — Progress Notes (Signed)
 NURSING HOME LOCATION:  [facility] ROOM NUMBER:  [id number]  CODE STATUS:  DNR  PCP: Dettinger, Fonda LABOR, MD   This is a nursing facility follow up visit of chronic medical diagnoses to document compliance with Regulation 483.30 (c) in The Long Term Care Survey Manual Phase 2 which mandates caregiver visit ( visits can alternate among physician, PA or NP as per statutes) within 10 days of 30 days / 60 days/ 90 days post admission to SNF date  .  Interim medical record and care since last SNF visit was updated with review of diagnostic studies and change in clinical status since last visit were documented.  HPI: She is a permanent resident of this facility with medical diagnoses of peripheral neuropathy; essential hypertension; dyslipidemia; GERD; history of celiac disease/sprue; and history of esophageal stricture. Significant procedures and surgeries include EGD, abdominal hysterectomy, colonoscopy, and thyroidectomy.  Despite the history of thyroidectomy; she is not on a thyroid  supplement; however, TSH was therapeutic at 3.184 on 04/29/2024.SABRA  B12 level was 293 on 02/08/2024; previously it had been supratherapeutic with prior value 1250 on 07/27/2023.  She has a history of anemia but the most recent CBC was normal with normochromic, normocytic indices.  There was slight leukocytosis with a white count of 11,100.  Previous labs have revealed mild hyperglycemia with glucoses ranging from 94 up to 129.  Total protein was low at 6.1 and albumin was normal at 3.8.  Review of systems: Complete ROS was negative; she stated nothing I am aware of when I asked if she were having any symptoms.  She validates that she had thyroid  surgery but it was so long ago I do not know why.  She does believe she was previously on B12 supplements.  Constitutional: No fever, significant weight change, fatigue  Eyes: No redness, discharge, pain, vision change ENT/mouth: No nasal congestion,  purulent  discharge, earache, change in hearing, sore throat  Cardiovascular: No chest pain, palpitations, paroxysmal nocturnal dyspnea, claudication, edema  Respiratory: No cough, sputum production, hemoptysis, DOE, significant snoring, apnea   Gastrointestinal: No heartburn, dysphagia, abdominal pain, nausea /vomiting, rectal bleeding, melena, change in bowels Genitourinary: No dysuria, hematuria, pyuria, incontinence, nocturia Musculoskeletal: No joint stiffness, joint swelling, weakness, pain Dermatologic: No rash, pruritus, change in appearance of skin Neurologic: No dizziness, headache, syncope, seizures, numbness, tingling Psychiatric: No significant anxiety, depression, insomnia, anorexia Endocrine: No change in hair/skin/nails, excessive thirst, excessive hunger, excessive urination  Hematologic/lymphatic: No significant bruising, lymphadenopathy, abnormal bleeding Allergy/immunology: No itchy/watery eyes, significant sneezing, urticaria, angioedema  Physical exam:  Pertinent or positive findings: She is thin and appears suboptimally nourished.  Lower lids are puffy.  There is malalignment of the mandibular teeth.  Heart rhythm is irregular due to occasional pauses.  Dorsalis pedis pulses are stronger than posterior tibial pulses.  Limb atrophy and interosseous wasting are present.  She has trace-1/2+ edema at the ankles.  She has isolated osteoarthritic changes of the fingers.  General appearance: no acute distress, increased work of breathing is present.   Lymphatic: No lymphadenopathy about the head, neck, axilla. Eyes: No conjunctival inflammation or lid edema is present. There is no scleral icterus. Ears:  External ear exam shows no significant lesions or deformities.   Nose:  External nasal examination shows no deformity or inflammation. Nasal mucosa are pink and moist without lesions, exudates Oral exam:  Lips and gums are healthy appearing. There is no oropharyngeal erythema or  exudate. Neck:  No thyromegaly, masses,  tenderness noted.    Heart:  No gallop, murmur, click, rub .  Lungs: Chest clear to auscultation without wheezes, rhonchi, rales, rubs. Abdomen: Bowel sounds are normal. Abdomen is soft and nontender with no organomegaly, hernias, masses. GU: Deferred  Extremities:  No cyanosis, clubbing  Neurologic exam :Balance, Rhomberg, finger to nose testing could not be completed due to clinical state Deep tendon reflexes are equal Skin: Warm & dry w/o tenting. No significant lesions or rash.  See summary under each active problem in the Problem List with associated updated therapeutic plan

## 2024-06-24 NOTE — Patient Instructions (Signed)
 See assessment and plan under each diagnosis in the problem list and acutely for this visit

## 2024-06-24 NOTE — Assessment & Plan Note (Signed)
 Despite significant past medical history of esophageal stricture; she denies dysphagia or any other GI symptoms.

## 2024-06-24 NOTE — Assessment & Plan Note (Signed)
 Serially albumin has been normal; total protein was 6.1 most recently but serially stable.  She does exhibit limb atrophy and interosseous wasting.  Nutritionist to follow at the SNF.

## 2024-06-24 NOTE — Assessment & Plan Note (Signed)
 Today she did validate having had thyroid  surgery but could not provide the indication for such.  Serially TSH has remained therapeutic.  Annual check should suffice.

## 2024-06-26 ENCOUNTER — Other Ambulatory Visit (HOSPITAL_COMMUNITY)
Admission: RE | Admit: 2024-06-26 | Discharge: 2024-06-26 | Disposition: A | Discharge status: Home / Self Care | Source: Skilled Nursing Facility | Attending: Adult Health | Admitting: Adult Health

## 2024-06-26 DIAGNOSIS — M810 Age-related osteoporosis without current pathological fracture: Secondary | ICD-10-CM | POA: Diagnosis present

## 2024-06-26 LAB — VITAMIN B12: Vitamin B-12: 408 pg/mL (ref 180–914)

## 2024-08-05 ENCOUNTER — Non-Acute Institutional Stay (SKILLED_NURSING_FACILITY): Admitting: Adult Health

## 2024-08-05 ENCOUNTER — Encounter: Payer: Self-pay | Admitting: Adult Health

## 2024-08-05 DIAGNOSIS — Z681 Body mass index (BMI) 19 or less, adult: Secondary | ICD-10-CM

## 2024-08-05 DIAGNOSIS — K5909 Other constipation: Secondary | ICD-10-CM

## 2024-08-05 DIAGNOSIS — R636 Underweight: Secondary | ICD-10-CM | POA: Diagnosis not present

## 2024-08-05 DIAGNOSIS — I700 Atherosclerosis of aorta: Secondary | ICD-10-CM | POA: Diagnosis not present

## 2024-08-05 NOTE — Progress Notes (Unsigned)
 Location:  [facility] Room Number: 133/P Place of Service:  SNF (31)   CODE STATUS: DNR  Allergies  Allergen Reactions   Prevnar 13 [Pneumococcal 13-Val Conj Vacc] Hives    Red/streaking arm   Penicillins Hives   Tetanus Toxoid-Containing Vaccines Other (See Comments)    Area of injection was red and streaked.   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 - and then DM[employer] x 15 yrs - retiredWalks daily - exercise   Close with her large church family   Still driving and very independent   Social Drivers of Corporate investment banker Strain: Low Risk  (12/15/2022)   Overall Financial Resource Strain (CARDIA)    Difficulty of Paying Living Expenses: Not hard at all  Food Insecurity: No Food Insecurity (12/16/2023)   Hunger Vital Sign    Worried About Running Out of Food in the Last Year: Never true    Ran Out of Food in the Last Year: Never true  Transportation Needs: No Transportation Needs (12/16/2023)   PRAPARE - Administrator, Civil Service (Medical): No    Lack of Transportation (Non-Medical): No  Physical Activity: Insufficiently Active (12/15/2022)   Exercise Vital Sign    Days of Exercise per Week: 3 days    Minutes of Exercise per Session: 30 min  Stress: No Stress Concern Present (12/15/2022)   [employer] of Occupational Health - Occupational Stress Questionnaire    Feeling  of Stress : Not at all  Social Connections: Moderately Integrated (12/16/2023)   Social Connection and Isolation Panel    Frequency of Communication with Friends and Family: More than three times a week    Frequency of Social Gatherings with Friends and Family: More than three times a week    Attends Religious Services: More than 4 times per year    Active Member of [employer] or Organizations: Yes    Attends Banker Meetings: More than 4 times per year    Marital Status: Widowed  Intimate Partner Violence: Not At Risk (12/16/2023)   Humiliation, Afraid, Rape, and Kick questionnaire    Fear of Current or Ex-Partner: No    Emotionally Abused: No    Physically Abused: No    Sexually Abused: No   Family History  Problem Relation Age of Onset   Healthy Mother    Lung cancer Father    Colon cancer Neg Hx    Stomach cancer Neg Hx    Esophageal cancer Neg Hx        VITAL SIGNS BP (!) 118/51   Pulse 74   Temp 98.4 F (36.9 C)   Resp 18   Ht 5' (1.524 m)   Wt 94 lb 3.2 oz (42.7 kg)   SpO2 100%   BMI 18.40 kg/m   Outpatient Encounter Medications as of 08/05/2024  Medication Sig   acetaminophen  (TYLENOL ) 325 MG tablet Take 2 tablets (650 mg total) by mouth every 6 (six) hours as needed for mild pain (or Fever >/= 101).   amLODipine  (NORVASC ) 5 MG tablet Take 1 tablet (5 mg total) by mouth daily.   Cholecalciferol  (VITAMIN D ) 2000 UNITS CAPS Take by mouth as directed. Take 1 tablet daily except none on saturdays and sundays   CVS VITAMIN C  500 MG tablet TAKE 1 TABLET (500 MG TOTAL) BY MOUTH DAILY.   docusate sodium  (COLACE) 100 MG capsule Take 1 capsule (100 mg total) by mouth 2 (two) times daily.   Fe Fum-FePoly-Vit C-Vit B3 (INTEGRA) 62.5-62.5-40-3 MG CAPS TAKE 1 CAPSULE BY MOUTH EVERY DAY   GEMTESA 75 MG TABS Take 1 tablet by mouth daily.   meloxicam (MOBIC) 7.5 MG tablet Take 7.5 mg by mouth daily.   metoCLOPramide  (REGLAN ) 5 MG tablet Take 5 mg by mouth at bedtime.   Nutritional Supplements (,FEEDING SUPPLEMENT, PROSOURCE PLUS) liquid Take 30 mLs by mouth 3 (three) times daily between meals.   omeprazole  (PRILOSEC) 20 MG capsule Take 20 mg by mouth daily.   potassium chloride  SA (KLOR-CON  M) 20 MEQ tablet Take 1 tablet (20 mEq total) by mouth daily.   No facility-administered encounter medications on file as of 08/05/2024.     SIGNIFICANT DIAGNOSTIC EXAMS       ASSESSMENT/ PLAN:     Barnie Seip NP [facility]  Contact [phone] Monday through Friday 8am- 5pm  After hours call [phone]

## 2024-08-07 ENCOUNTER — Other Ambulatory Visit (HOSPITAL_COMMUNITY)
Admission: RE | Admit: 2024-08-07 | Discharge: 2024-08-07 | Disposition: A | Discharge status: Home / Self Care | Source: Skilled Nursing Facility | Attending: Adult Health | Admitting: Adult Health

## 2024-08-07 DIAGNOSIS — Z681 Body mass index (BMI) 19 or less, adult: Secondary | ICD-10-CM | POA: Insufficient documentation

## 2024-08-07 DIAGNOSIS — I10 Essential (primary) hypertension: Secondary | ICD-10-CM | POA: Diagnosis present

## 2024-08-07 LAB — CBC
HCT: 43.1 % (ref 36.0–46.0)
Hemoglobin: 14.5 g/dL (ref 12.0–15.0)
MCH: 30.7 pg (ref 26.0–34.0)
MCHC: 33.6 g/dL (ref 30.0–36.0)
MCV: 91.3 fL (ref 80.0–100.0)
Platelets: 192 K/uL (ref 150–400)
RBC: 4.72 MIL/uL (ref 3.87–5.11)
RDW: 13.4 % (ref 11.5–15.5)
WBC: 7.7 K/uL (ref 4.0–10.5)
nRBC: 0 % (ref 0.0–0.2)

## 2024-08-07 LAB — COMPREHENSIVE METABOLIC PANEL WITH GFR
ALT: 13 U/L (ref 0–44)
AST: 14 U/L — ABNORMAL LOW (ref 15–41)
Albumin: 3 g/dL — ABNORMAL LOW (ref 3.5–5.0)
Alkaline Phosphatase: 36 U/L — ABNORMAL LOW (ref 38–126)
Anion gap: 9 (ref 5–15)
BUN: 28 mg/dL — ABNORMAL HIGH (ref 8–23)
CO2: 27 mmol/L (ref 22–32)
Calcium: 8.9 mg/dL (ref 8.9–10.3)
Chloride: 103 mmol/L (ref 98–111)
Creatinine, Ser: 0.53 mg/dL (ref 0.44–1.00)
GFR, Estimated: >60 mL/min (ref >60)
Glucose, Bld: 105 mg/dL — ABNORMAL HIGH (ref 70–99)
Potassium: 4 mmol/L (ref 3.5–5.1)
Sodium: 139 mmol/L (ref 135–145)
Total Bilirubin: 0.6 mg/dL (ref 0.0–1.2)
Total Protein: 5.3 g/dL — ABNORMAL LOW (ref 6.5–8.1)

## 2024-08-08 ENCOUNTER — Non-Acute Institutional Stay (SKILLED_NURSING_FACILITY): Payer: Self-pay | Admitting: Adult Health

## 2024-08-08 ENCOUNTER — Encounter: Payer: Self-pay | Admitting: Adult Health

## 2024-08-08 DIAGNOSIS — R636 Underweight: Secondary | ICD-10-CM | POA: Diagnosis not present

## 2024-08-08 DIAGNOSIS — K900 Celiac disease: Secondary | ICD-10-CM | POA: Diagnosis not present

## 2024-08-08 DIAGNOSIS — Z681 Body mass index (BMI) 19 or less, adult: Secondary | ICD-10-CM

## 2024-08-08 DIAGNOSIS — I700 Atherosclerosis of aorta: Secondary | ICD-10-CM | POA: Diagnosis not present

## 2024-08-08 NOTE — Progress Notes (Signed)
 Location:  [facility] Room Number: 133-P Place of Service:  SNF (31)   CODE STATUS: DNR  Allergies  Allergen Reactions   Prevnar 13 [Pneumococcal 13-Val Conj Vacc] Hives    Red/streaking arm   Penicillins Hives   Tetanus Toxoid-Containing Vaccines Other (See Comments)    Area of injection was red and streaked.   Codeine Nausea Only   Sulfonamide Derivatives Nausea Only    Chief Complaint  Patient presents with   Acute Visit    Care plan meeting      HPI:  We have come together for her care plan meeting. BIMS 15/15 mood 2/30: depressed and tired once in a while. She is independent to supervision for her adl care. She is frequently incontinent of bladder and bowel. Dietary: gluten free diet feeds self; appetite 51-100% . Weight 94.2 pounds. Therapy none at this time. Activities: in room; movies. 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 - and then DMV x 15 yrs - retiredWalks daily - exercise   Close with her large church family   Still driving and very independent   Social Drivers of Corporate investment banker Strain: Low Risk  (12/15/2022)   Overall Financial Resource Strain (CARDIA)    Difficulty of Paying Living Expenses: Not hard at all  Food Insecurity: No Food Insecurity (12/16/2023)   Hunger Vital Sign    Worried About Running Out of Food in the Last Year: Never true    Ran Out of Food in the Last Year: Never true  Transportation Needs: No Transportation Needs (12/16/2023)   PRAPARE -  Administrator, Civil Service (Medical): No    Lack of Transportation (Non-Medical): No  Physical Activity: Insufficiently Active (12/15/2022)   Exercise Vital Sign    Days of Exercise per Week: 3 days    Minutes of Exercise per Session: 30 min  Stress: No Stress Concern Present (12/15/2022)   [employer] of Occupational Health - Occupational Stress Questionnaire    Feeling of Stress : Not at all  Social Connections: Moderately Integrated (12/16/2023)   Social Connection and Isolation Panel    Frequency of Communication with Friends and Family: More than three times a week    Frequency of Social Gatherings with Friends and Family: More than three times a week    Attends Religious Services: More than 4 times per year    Active Member of [employer] or Organizations: Yes    Attends Banker Meetings: More than 4 times per year     Marital Status: Widowed  Intimate Partner Violence: Not At Risk (12/16/2023)   Humiliation, Afraid, Rape, and Kick questionnaire    Fear of Current or Ex-Partner: No    Emotionally Abused: No    Physically Abused: No    Sexually Abused: No   Family History  Problem Relation Age of Onset   Healthy Mother    Lung cancer Father    Colon cancer Neg Hx    Stomach cancer Neg Hx    Esophageal cancer Neg Hx       VITAL SIGNS BP (!) 118/51   Pulse 74   Ht 5' (1.524 m)   Wt 94 lb 3.2 oz (42.7 kg)   BMI 18.40 kg/m   Outpatient Encounter Medications as of 08/08/2024  Medication Sig   acetaminophen  (TYLENOL ) 325 MG tablet Take 2 tablets (650 mg total) by mouth every 6 (six) hours as needed for mild pain (or Fever >/= 101).   amLODipine  (NORVASC ) 5 MG tablet Take 1 tablet (5 mg total) by mouth daily.   Cholecalciferol  (VITAMIN D ) 2000 UNITS CAPS Take by mouth as directed. Take 1 tablet daily except none on saturdays and sundays   CVS VITAMIN C  500 MG tablet TAKE 1 TABLET (500 MG TOTAL) BY MOUTH DAILY.   docusate sodium  (COLACE) 100 MG capsule Take 1 capsule (100 mg total) by mouth 2 (two) times daily.   Ensure (ENSURE) Take 237 mLs by mouth daily.   Fe Fum-FePoly-Vit C-Vit B3 (INTEGRA) 62.5-62.5-40-3 MG CAPS TAKE 1 CAPSULE BY MOUTH EVERY DAY   GEMTESA 75 MG TABS Take 1 tablet by mouth daily.   meloxicam (MOBIC) 7.5 MG tablet Take 7.5 mg by mouth daily.   metoCLOPramide  (REGLAN ) 5 MG tablet Take 5 mg by mouth at bedtime.   Nutritional Supplements (,FEEDING SUPPLEMENT, PROSOURCE PLUS) liquid Take 30 mLs by mouth 3 (three) times daily between meals.   omeprazole  (PRILOSEC) 20 MG capsule Take 20 mg by mouth daily.   potassium chloride  SA (KLOR-CON  M) 20 MEQ tablet Take 1 tablet (20 mEq total) by mouth daily.   No facility-administered encounter medications on file as of 08/08/2024.     SIGNIFICANT DIAGNOSTIC EXAMS  TODAY  12-16-23: wbc 6.9; hgb 14.3; hct 41.8; mcv 93.5 plt 160; glucose  129; bun 11; creat 0.54 k+ 3.7; na++ 136; ca 9.5 gfr >60 protein 6.1 albumin 3.78  02-08-24: tsh 3.083; RPR: nr vitamin B12: 293 04-29-24: tsh 3.184; free t4: 1.18   Review of Systems  Constitutional:  Negative for malaise/fatigue.  Respiratory:  Negative for cough  and shortness of breath.   Cardiovascular:  Negative for chest pain, palpitations and leg swelling.  Gastrointestinal:  Negative for abdominal pain, constipation and heartburn.  Musculoskeletal:  Negative for back pain, joint pain and myalgias.  Skin: Negative.   Neurological:  Negative for dizziness.  Psychiatric/Behavioral:  The patient is not nervous/anxious.     Physical Exam Constitutional:      General: She is not in acute distress.    Appearance: She is well-developed. She is not diaphoretic.  Neck:     Thyroid : No thyromegaly.  Cardiovascular:     Rate and Rhythm: Normal rate and regular rhythm.     Pulses: Normal pulses.     Heart sounds: Normal heart sounds.  Pulmonary:     Effort: Pulmonary effort is normal. No respiratory distress.     Breath sounds: Normal breath sounds.  Abdominal:     General: Bowel sounds are normal. There is no distension.     Palpations: Abdomen is soft.     Tenderness: There is no abdominal tenderness.  Musculoskeletal:        General: Normal range of motion.     Cervical back: Neck supple.     Right lower leg: No edema.     Left lower leg: No edema.  Lymphadenopathy:     Cervical: No cervical adenopathy.  Skin:    General: Skin is warm and dry.  Neurological:     Mental Status: She is alert. Mental status is at baseline.  Psychiatric:        Mood and Affect: Mood normal.       ASSESSMENT/ PLAN:  TODAY  Aortic atherosclerosis Celiac sprue Underweight  Will continue current medications Will continue current plan of care Will continue to monitor her status.    Barnie Seip NP [facility]   call [phone]

## 2024-08-18 DIAGNOSIS — L603 Nail dystrophy: Secondary | ICD-10-CM | POA: Diagnosis not present

## 2024-08-18 DIAGNOSIS — I739 Peripheral vascular disease, unspecified: Secondary | ICD-10-CM | POA: Diagnosis not present

## 2024-08-18 DIAGNOSIS — L602 Onychogryphosis: Secondary | ICD-10-CM | POA: Diagnosis not present

## 2024-09-02 ENCOUNTER — Encounter: Payer: Self-pay | Admitting: Adult Health

## 2024-09-02 ENCOUNTER — Non-Acute Institutional Stay (SKILLED_NURSING_FACILITY): Payer: Self-pay | Admitting: Adult Health

## 2024-09-02 DIAGNOSIS — R4189 Other symptoms and signs involving cognitive functions and awareness: Secondary | ICD-10-CM | POA: Diagnosis not present

## 2024-09-02 DIAGNOSIS — N3281 Overactive bladder: Secondary | ICD-10-CM | POA: Diagnosis not present

## 2024-09-02 DIAGNOSIS — I10 Essential (primary) hypertension: Secondary | ICD-10-CM | POA: Diagnosis not present

## 2024-09-02 DIAGNOSIS — R29818 Other symptoms and signs involving the nervous system: Secondary | ICD-10-CM | POA: Diagnosis not present

## 2024-09-02 NOTE — Progress Notes (Signed)
 Location:  [facility] Room Number: 133/P Place of Service:  SNF (31)   CODE STATUS: DNR  Allergies  Allergen Reactions   Prevnar 13 [Pneumococcal 13-Val Conj Vacc] Hives    Red/streaking arm   Penicillins Hives   Tetanus Toxoid-Containing Vaccines Other (See Comments)    Area of injection was red and streaked.   Codeine Nausea Only   Sulfonamide Derivatives Nausea Only    Chief Complaint  Patient presents with   Medical Management of Chronic Issues                  Primary hypertension:     Urinary incontinence    Neurocognitive deficits    HPI:  She is a 89 y.o. long term resident of this facility being seen for the management of her chronic illnesses:Primary hypertension:     Urinary incontinence    Neurocognitive deficits. Her weight is stable slightly underweight. Her cognition remains without significant change. Her blood pressure readings remain under controll.    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 DMV x 15 yrs - retiredWalks daily - exercise   Close with her large church family   Still driving and very independent   Social Drivers of Corporate Investment Banker Strain: Low Risk  (12/15/2022)   Overall Financial Resource Strain (CARDIA)    Difficulty of Paying Living Expenses: Not hard at all  Food Insecurity: No Food Insecurity (12/16/2023)   Hunger Vital Sign    Worried About Running Out of Food in the Last Year: Never true    Ran Out of Food in the Last Year: Never true  Transportation Needs: No Transportation Needs (12/16/2023)  PRAPARE - Administrator, Civil Service (Medical): No    Lack of Transportation (Non-Medical): No  Physical Activity: Insufficiently Active (12/15/2022)   Exercise Vital Sign    Days of Exercise per Week: 3 days    Minutes of Exercise per Session: 30 min  Stress: No Stress Concern Present (12/15/2022)   [employer] of Occupational Health - Occupational Stress Questionnaire    Feeling of Stress : Not at all  Social Connections: Moderately Integrated (12/16/2023)   Social Connection and Isolation Panel    Frequency of Communication with Friends and Family: More than three times a week    Frequency of Social Gatherings with Friends and Family: More than three times a week    Attends Religious Services: More than 4 times per year    Active Member of [employer] or Organizations: Yes    Attends Banker Meetings: More than 4 times per year    Marital Status: Widowed  Intimate Partner  Violence: Not At Risk (12/16/2023)   Humiliation, Afraid, Rape, and Kick questionnaire    Fear of Current or Ex-Partner: No    Emotionally Abused: No    Physically Abused: No    Sexually Abused: No   Family History  Problem Relation Age of Onset   Healthy Mother    Lung cancer Father    Colon cancer Neg Hx    Stomach cancer Neg Hx    Esophageal cancer Neg Hx       VITAL SIGNS BP 130/67   Pulse 80   Temp 98.1 F (36.7 C)   Resp 20   Ht 5' (1.524 m)   Wt 95 lb 6.4 oz (43.3 kg)   SpO2 96%   BMI 18.63 kg/m   Outpatient Encounter Medications as of 09/02/2024  Medication Sig   acetaminophen  (TYLENOL ) 325 MG tablet Take 2 tablets (650 mg total) by mouth every 6 (six) hours as needed for mild pain (or Fever >/= 101).   amLODipine  (NORVASC ) 5 MG tablet Take 1 tablet (5 mg total) by mouth daily.   Cholecalciferol  (VITAMIN D ) 2000 UNITS CAPS Take by mouth as directed. Take 1 tablet daily except none on saturdays and sundays   CVS VITAMIN C  500 MG tablet TAKE 1 TABLET (500 MG TOTAL) BY MOUTH DAILY.   docusate sodium  (COLACE) 100 MG capsule Take 1 capsule (100 mg total) by mouth 2 (two) times daily.   Ensure (ENSURE) Take 237 mLs by mouth daily.   Fe Fum-FePoly-Vit C-Vit B3 (INTEGRA) 62.5-62.5-40-3 MG CAPS TAKE 1 CAPSULE BY MOUTH EVERY DAY   GEMTESA 75 MG TABS Take 1 tablet by mouth daily.   meloxicam (MOBIC) 7.5 MG tablet Take 7.5 mg by mouth daily.   Nutritional Supplements (,FEEDING SUPPLEMENT, PROSOURCE PLUS) liquid Take 30 mLs by mouth 3 (three) times daily between meals.   omeprazole  (PRILOSEC) 20 MG capsule Take 20 mg by mouth daily.   potassium chloride  SA (KLOR-CON  M) 20 MEQ tablet Take 1 tablet (20 mEq total) by mouth daily.   metoCLOPramide  (REGLAN ) 5 MG tablet Take 5 mg by mouth at bedtime. (Patient not taking: Reported on 09/02/2024)   No facility-administered encounter medications on file as of 09/02/2024.     SIGNIFICANT DIAGNOSTIC EXAMS  TODAY  12-16-23: wbc 6.9;  hgb 14.3; hct 41.8; mcv 93.5 plt 160; glucose 129; bun 11; creat 0.54 k+ 3.7; na++ 136; ca 9.5 gfr >60 protein 6.1 albumin 3.78  02-08-24: tsh 3.083; RPR: nr vitamin B12: 293 04-29-24:  tsh 3.184; free t4: 1.18 06-26-24: vitamin B12: 408 08-07-24: wbc 7.7; hgb 14.5; hct 43.1; mcv 91.3 plt 912; glucose 105; bun 28; creat 0.53; k+ 4.0; na++ 139; ca 8.9; gfr >60; protein 5.3 albumin 3.0   Review of Systems  Constitutional:  Negative for malaise/fatigue.  Respiratory:  Negative for cough and shortness of breath.   Cardiovascular:  Negative for chest pain, palpitations and leg swelling.  Gastrointestinal:  Negative for abdominal pain, constipation and heartburn.  Musculoskeletal:  Negative for back pain, joint pain and myalgias.  Skin: Negative.   Neurological:  Negative for dizziness.  Psychiatric/Behavioral:  The patient is not nervous/anxious.     Physical Exam Constitutional:      General: She is not in acute distress.    Appearance: She is well-developed. She is not diaphoretic.  Neck:     Thyroid : No thyromegaly.  Cardiovascular:     Rate and Rhythm: Normal rate and regular rhythm.     Heart sounds: Normal heart sounds.  Pulmonary:     Effort: Pulmonary effort is normal. No respiratory distress.     Breath sounds: Normal breath sounds.  Abdominal:     General: Bowel sounds are normal. There is no distension.     Palpations: Abdomen is soft.     Tenderness: There is no abdominal tenderness.  Musculoskeletal:        General: Normal range of motion.     Cervical back: Neck supple.     Right lower leg: No edema.     Left lower leg: No edema.  Lymphadenopathy:     Cervical: No cervical adenopathy.  Skin:    General: Skin is warm and dry.  Neurological:     Mental Status: She is alert. Mental status is at baseline.  Psychiatric:        Mood and Affect: Mood normal.       ASSESSMENT/ PLAN:  TODAY  Primary hypertension: b/p 130/67 will continue norvasc  5 mg daily   2.  Urinary incontinence : will continue gemesta 75 mg daily   3. Neurocognitive deficits BCAT 04-24-24 34/50  PREVIOUS   4. Chronic anemia: hgb 14.3; will continue intregra 62.5/62.5/40.5 every other day   5. GERD without esophagitis: will continue prilosec 20 mg daily and reglan  5 mg nightly      6. Underweight: BMI 18.63; will continue supplements as directed  7. Chronic constipation: will continue colace twice daily   8. Aortic atherosclerosis: (ct 08-27-23) is not on statin due to advanced age.         Barnie Seip NP [facility]   call [phone]

## 2024-09-22 ENCOUNTER — Non-Acute Institutional Stay (SKILLED_NURSING_FACILITY): Admitting: Family Medicine

## 2024-09-22 DIAGNOSIS — K5909 Other constipation: Secondary | ICD-10-CM

## 2024-09-22 DIAGNOSIS — M25552 Pain in left hip: Secondary | ICD-10-CM | POA: Diagnosis not present

## 2024-09-22 DIAGNOSIS — I10 Essential (primary) hypertension: Secondary | ICD-10-CM | POA: Diagnosis not present

## 2024-09-22 DIAGNOSIS — N3281 Overactive bladder: Secondary | ICD-10-CM | POA: Diagnosis not present

## 2024-09-22 NOTE — Progress Notes (Signed)
 Location:  [facility]   Place of Service:      CODE STATUS: DNR  Allergies  Allergen Reactions   Prevnar 13 [Pneumococcal 13-Val Conj Vacc] Hives    Red/streaking arm   Penicillins Hives   Tetanus Toxoid-Containing Vaccines Other (See Comments)    Area of injection was red and streaked.   Codeine Nausea Only   Sulfonamide Derivatives Nausea Only    No chief complaint on file.   HPI: Kristi Sawyer is a 89 y.o. long-term resident of this facility being seen today for management of chronic illnesses including HTN, urinary incontinence, neurocognitive deficits. She tells me today that she has pain in her left hip which started late yesterday. She denies any falls or bumps precipitating this, though she does say she sat in her chair for a long time yesterday. Pain was 10/10 yesterday but improved after she asked for medicine; this AM she reports pain is 7/10. She can still sit up, transfer to chair, etc without too much distress. Weight is stable. Appetite is good. Cognition remains without significant change. Blood pressure is well-controlled.   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 - retiredWalks daily - exercise   Close with her large church family   Still driving and very independent   Social Drivers of Corporate Investment Banker Strain: Low Risk  (12/15/2022)   Overall Financial Resource Strain (CARDIA)    Difficulty of Paying Living Expenses: Not hard at all  Food Insecurity: No Food Insecurity (12/16/2023)   Hunger Vital Sign    Worried About Running Out of Food in the Last Year: Never true    Ran Out of Food in  the Last Year: Never true  Transportation Needs: No Transportation Needs (12/16/2023)   PRAPARE - Administrator, Civil Service (Medical): No    Lack of Transportation (Non-Medical): No  Physical Activity: Insufficiently Active (12/15/2022)   Exercise Vital Sign    Days of Exercise per Week: 3 days    Minutes of Exercise per Session: 30 min  Stress: No Stress Concern Present (12/15/2022)   [employer] of Occupational Health - Occupational Stress Questionnaire    Feeling of Stress : Not at all  Social Connections: Moderately Integrated (12/16/2023)   Social Connection and Isolation Panel    Frequency of Communication with Friends and Family: More than three times a week    Frequency of Social Gatherings with Friends and Family: More than three times a week    Attends Religious Services: More than 4 times per year    Active  Member of [employer] or Organizations: Yes    Attends Banker Meetings: More than 4 times per year    Marital Status: Widowed  Intimate Partner Violence: Not At Risk (12/16/2023)   Humiliation, Afraid, Rape, and Kick questionnaire    Fear of Current or Ex-Partner: No    Emotionally Abused: No    Physically Abused: No    Sexually Abused: No   Family History  Problem Relation Age of Onset   Healthy Mother    Lung cancer Father    Colon cancer Neg Hx    Stomach cancer Neg Hx    Esophageal cancer Neg Hx       VITAL SIGNS BP 132/76   Pulse 88   Temp (!) 97.2 F (36.2 C)   Resp 20   SpO2 95%   Outpatient Encounter Medications as of 09/22/2024  Medication Sig   acetaminophen  (TYLENOL ) 325 MG tablet Take 2 tablets (650 mg total) by mouth every 6 (six) hours as needed for mild pain (or Fever >/= 101).   amLODipine  (NORVASC ) 5 MG tablet Take 1 tablet (5 mg total) by mouth daily.   Cholecalciferol  (VITAMIN D ) 2000 UNITS CAPS Take by mouth as directed. Take 1 tablet daily except none on saturdays and sundays   CVS VITAMIN C  500 MG tablet TAKE 1 TABLET (500 MG TOTAL) BY MOUTH DAILY.   docusate sodium  (COLACE) 100 MG capsule Take 1 capsule (100 mg total) by mouth 2 (two) times daily.   Ensure (ENSURE) Take 237 mLs by mouth daily.   Fe Fum-FePoly-Vit C-Vit B3 (INTEGRA) 62.5-62.5-40-3 MG CAPS TAKE 1 CAPSULE BY MOUTH EVERY DAY   GEMTESA 75 MG TABS Take 1 tablet by mouth daily.   meloxicam (MOBIC) 7.5 MG tablet Take 7.5 mg by mouth daily.   metoCLOPramide  (REGLAN ) 5 MG tablet Take 5 mg by mouth at bedtime. (Patient not taking: Reported on 09/02/2024)   Nutritional Supplements (,FEEDING SUPPLEMENT, PROSOURCE PLUS) liquid Take 30 mLs by mouth 3 (three) times daily between meals.   omeprazole  (PRILOSEC) 20 MG capsule Take 20 mg by mouth daily.   potassium chloride  SA (KLOR-CON  M) 20 MEQ tablet Take 1 tablet (20 mEq total) by mouth daily.   No facility-administered encounter medications  on file as of 09/22/2024.     SIGNIFICANT DIAGNOSTIC EXAMS  Most recent labs: 12/16/23: wbc 6.9; hgb 14.3; hct 41.8; mcv 93.5 plt 160; glucose 129; bun 11; creat 0.54 k+ 3.7; na++ 136; ca 9.5 gfr >60 protein 6.1 albumin 3.78   02/08/24: tsh 3.083; RPR: nr vitamin B12: 293  04/29/24: tsh 3.184;  free t4: 1.18  06/26/24: vitamin B12: 408  08/07/24: wbc 7.7; hgb 14.5; hct 43.1; mcv 91.3 plt 912; glucose 105; bun 28; creat 0.53; k+ 4.0; na++ 139; ca 8.9; gfr >60; protein 5.3 albumin 3.0     ROS  Constitutional:  Negative for malaise/fatigue.  Respiratory:  Negative for cough, shortness of breath.   Cardiovascular:  Negative for chest pain, palpitations, leg swelling.  Gastrointestinal:  Negative for abdominal pain, constipation, heartburn.  Musculoskeletal:  Left hip pain. Negative for back pain, myalgias.  Skin: Negative.   Neurological:  Negative for dizziness.  Psychiatric/Behavioral:  The patient is not nervous/anxious.  Physical Exam General: well-appearing elderly woman, pleasant, no acute distress. HEENT: normocephalic, EOM grossly intact, MMM. Cardio: Regular rate, regular rhythm, no murmurs on exam. Pulm: Clear, no wheezing, no crackles. No increased work of breathing. Abdominal: soft, non-tender, non-distended. Extremities: Pain with left hip FADIR, no pain with FABER. Full left hip ROM intact. Sensation intact. No peripheral edema. Neuro: Alert and oriented x3, speech normal in content, no facial asymmetry. Psych:  Cognition and judgment appear intact. Alert, communicative, and cooperative.    ASSESSMENT/ PLAN:  1) Left hip pain: most likely related to OA and prior hip surgery. I recommend PT to mobilize and reduce discomfort. Continue Meloxicam 7.5 mg PRN daily.  2) Primary HTN: BP well-controlled. Continue Norvasc  5 mg daily.  3) Urinary incontinence: She is doing well on current regimen. Continue Gemtesa 75 mg daily.  4) Chronic constipation: continue colace 100 mg  twice daily.   Lauraine Norse, DO [facility], PGY-2 09/22/24 11:30 AM    Barnie Seip NP [facility]  Contact [phone] Monday through Friday 8am- 5pm  After hours call [phone]

## 2024-09-24 ENCOUNTER — Non-Acute Institutional Stay (SKILLED_NURSING_FACILITY): Payer: Self-pay | Admitting: Internal Medicine

## 2024-09-24 ENCOUNTER — Encounter: Payer: Self-pay | Admitting: Internal Medicine

## 2024-09-24 DIAGNOSIS — I10 Essential (primary) hypertension: Secondary | ICD-10-CM

## 2024-09-24 DIAGNOSIS — K219 Gastro-esophageal reflux disease without esophagitis: Secondary | ICD-10-CM

## 2024-09-24 DIAGNOSIS — E440 Moderate protein-calorie malnutrition: Secondary | ICD-10-CM | POA: Diagnosis not present

## 2024-09-24 DIAGNOSIS — M1610 Unilateral primary osteoarthritis, unspecified hip: Secondary | ICD-10-CM | POA: Diagnosis not present

## 2024-09-24 NOTE — Progress Notes (Signed)
 NURSING HOME LOCATION:  [facility] ROOM NUMBER:  [id number]  CODE STATUS:  DNR  PCP: Landy Sober NP   This is a nursing facility follow up visit of chronic medical diagnoses to document compliance with Regulation 483.30 (c) in The Long Term Care Survey Manual Phase 2 which mandates caregiver visit ( visits can alternate among physician, PA or NP as per statutes) within 10 days of 30 days / 60 days/ 90 days post admission to SNF date  .  Interim medical record and care since last SNF visit was updated with review of diagnostic studies and change in clinical status since last visit were documented.  HPI: She is a permanent resident of this facility with medical diagnoses of GERD and prior esophageal stricture; history of celiac disease/sprue; dyslipidemia; osteoporosis; history of thyroid  tumor (benign); and peripheral neuropathy. Significant surgeries include history of thyroidectomy in 1975.  Labs are current as of 08/07/2024.  Protein/caloric malnutrition was suggested by an albumin of 3 and total protein of 5.3.  Mild prerenal azotemia was present with a BUN of 28; GFR was greater than 60.  Mild hyperglycemia was present with a value of 105.  CBC was normal.  Review of systems is totally negative except for some left hip and left lower extremity pain.  She states over the last 3 days she had sharp pain , up to a level 10, from above the left hip extending to below the left knee.  There was no trauma or definite etiology of the pain.  It is nonradiating other than peripherally & does not involve the back.  She states that it is much improved with oral pain medicine she has received; she believes it is Tylenol .  She is also on meloxicam 7.5 mg daily.  She denies associated numbness, tingling, or stool /urinary incontinence.  Constitutional: No fever, significant weight change, fatigue  Eyes: No redness, discharge, pain, vision change ENT/mouth: No nasal congestion,  purulent  discharge, earache, change in hearing, sore throat  Cardiovascular: No chest pain, palpitations, paroxysmal nocturnal dyspnea, claudication, edema  Respiratory: No cough, sputum production, hemoptysis, DOE, significant snoring, apnea   Gastrointestinal: No heartburn, dysphagia, abdominal pain, nausea /vomiting, rectal bleeding, melena, change in bowels Genitourinary: No dysuria, hematuria, pyuria, nocturia Dermatologic: No rash, pruritus, change in appearance of skin Neurologic: No dizziness, headache, syncope, seizures Psychiatric: No significant anxiety, depression, insomnia, anorexia Endocrine: No change in hair/skin/nails, excessive thirst, excessive hunger, excessive urination  Hematologic/lymphatic: No significant bruising, lymphadenopathy, abnormal bleeding Allergy/immunology: No itchy/watery eyes, significant sneezing, urticaria, angioedema  Physical exam:  Pertinent or positive findings: She appears younger than her stated age.  There is some staining of the teeth and malalignment of the mandibular teeth.  There is an extensive operative scar over the anterior neck base. First heart sound is increased.  Dorsalis pedis pulses are stronger than posterior tibial pulses.  She has trace edema at the sock line.  Interosseous wasting and limb atrophy are present.  She has isolated osteoarthritic changes in the hands particularly of the index fingers.  Straight leg raising and range of motion of the left hip does not induce pain.  Deep tendon reflexes are equal and normal in the lower extremities.  General appearance:  no acute distress, increased work of breathing is present.   Lymphatic: No lymphadenopathy about the head, neck, axilla. Eyes: No conjunctival inflammation or lid edema is present. There is no scleral icterus. Ears:  External ear exam shows no significant  lesions or deformities.   Nose:  External nasal examination shows no deformity or inflammation. Nasal mucosa are pink and moist  without lesions, exudates Neck:  No thyromegaly, masses, tenderness noted.    Heart:  Normal rate and regular rhythm without gallop, murmur, click, rub .  Lungs: Chest clear to auscultation without wheezes, rhonchi, rales, rubs. Abdomen: Bowel sounds are normal. Abdomen is soft and nontender with no organomegaly, hernias, masses. GU: Deferred  Extremities:  No cyanosis, clubbing  Neurologic exam :Balance, Rhomberg, finger to nose testing could not be completed due to clinical state Skin: Warm & dry w/o tenting. No significant lesions or rash.  See summary under each active problem in the Problem List with associated updated therapeutic plan :  Primary localized osteoarthritis of hip As there is been significant improvement with the Tylenol ; continue to monitor.  If the symptoms recur or progress; imaging could be performed.  Additionally topical Voltaren  gel could be initiated.  At this time the pain does not appear to be lumbar radiculopathy in etiology.  Unspecified protein-calorie malnutrition 08/07/2024 albumin 3.0 and total protein 5.3.  IOW & limb atrophy present.Nutritionist at SNF to monitor.  GERD without esophagitis At this time she is essentially asymptomatic with no complaints of dysphagia, dyspepsia, or abdominal pain.  Continue to monitor. NSAIDS for hip pain contraindicated.  Hypertension BP controlled; no change in antihypertensive medications

## 2024-09-24 NOTE — Assessment & Plan Note (Addendum)
 As there is been significant improvement with the Tylenol ; continue to monitor.  If the symptoms recur or progress; imaging could be performed.  Additionally topical Voltaren  gel could be initiated.  At this time the pain does not appear to be lumbar radiculopathy in etiology.

## 2024-09-24 NOTE — Assessment & Plan Note (Addendum)
 08/07/2024 albumin 3.0 and total protein 5.3.  IOW & limb atrophy present.Nutritionist at SNF to monitor.

## 2024-09-24 NOTE — Patient Instructions (Signed)
 See assessment and plan under each diagnosis in the problem list and acutely for this visit

## 2024-09-24 NOTE — Addendum Note (Signed)
 Addended by: LANDY BARNIE RAMAN on: 09/24/2024 11:37 AM   Modules accepted: Level of Service

## 2024-09-24 NOTE — Assessment & Plan Note (Addendum)
 At this time she is essentially asymptomatic with no complaints of dysphagia, dyspepsia, or abdominal pain.  Continue to monitor. NSAIDS for hip pain contraindicated.

## 2024-09-27 NOTE — Assessment & Plan Note (Signed)
 BP controlled; no change in antihypertensive medications

## 2024-10-29 ENCOUNTER — Encounter: Payer: Self-pay | Admitting: Adult Health

## 2024-10-29 ENCOUNTER — Non-Acute Institutional Stay: Payer: Self-pay | Admitting: Adult Health

## 2024-10-29 DIAGNOSIS — D649 Anemia, unspecified: Secondary | ICD-10-CM | POA: Diagnosis not present

## 2024-10-29 DIAGNOSIS — K219 Gastro-esophageal reflux disease without esophagitis: Secondary | ICD-10-CM

## 2024-10-29 DIAGNOSIS — R636 Underweight: Secondary | ICD-10-CM

## 2024-10-29 DIAGNOSIS — Z681 Body mass index (BMI) 19 or less, adult: Secondary | ICD-10-CM

## 2024-10-29 NOTE — Progress Notes (Unsigned)
 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 benign tumor     Social History   Socioeconomic History   Marital status: Widowed    Spouse name: Not on file   Number of children: 0   Years of education: 16   Highest education level: Bachelor's degree (e.g., BA, AB, BS)  Occupational History   Occupation: retired    Associate Professor: RETIRED  Tobacco Use   Smoking status: Never   Smokeless tobacco: Never  Vaping Use   Vaping status: Never Used  Substance and Sexual Activity   Alcohol use: No   Drug use: No   Sexual activity: Not Currently  Other Topics Concern   Not on file  Social History Narrative   WidowNo childrenReceptionist at [employer] x 47 yrs - and then [employer] x 15 yrs - retiredWalks daily - exercise   Close with her large church family   Still driving and very independent   Social Drivers of Health   Tobacco Use: Low Risk (10/29/2024)   Patient History    Smoking  Tobacco Use: Never    Smokeless Tobacco Use: Never    Passive Exposure: Not on file  Financial Resource Strain: Low Risk (12/15/2022)   Overall Financial Resource Strain (CARDIA)    Difficulty of Paying Living Expenses: Not hard at all  Food Insecurity: No Food Insecurity (12/16/2023)   Hunger Vital Sign    Worried About Running Out of Food in the Last Year: Never true    Ran Out of Food in the Last Year: Never true  Transportation Needs: No Transportation Needs (12/16/2023)   PRAPARE - Administrator, Civil Service (Medical): No    Lack of Transportation (Non-Medical): No  Physical Activity: Insufficiently Active (12/15/2022)   Exercise Vital Sign    Days of Exercise per Week: 3 days    Minutes of Exercise per Session: 30 min  Stress: No Stress Concern Present (12/15/2022)   [employer] of Occupational Health - Occupational Stress Questionnaire    Feeling of Stress : Not at all  Social Connections: Moderately Integrated (12/16/2023)   Social  Connection and Isolation Panel    Frequency of Communication with Friends and Family: More than three times a week    Frequency of Social Gatherings with Friends and Family: More than three times a week    Attends Religious Services: More than 4 times per year    Active Member of [employer] or Organizations: Yes    Attends Banker Meetings: More than 4 times per year    Marital Status: Widowed  Intimate Partner Violence: Not At Risk (12/16/2023)   Humiliation, Afraid, Rape, and Kick questionnaire    Fear of Current or Ex-Partner: No    Emotionally Abused: No    Physically Abused: No    Sexually Abused: No  Depression (PHQ2-9): Low Risk (04/02/2024)   Depression (PHQ2-9)    PHQ-2 Score: 0  Alcohol Screen: Low Risk (12/15/2022)   Alcohol Screen    Last Alcohol Screening Score (AUDIT): 0  Housing: Low Risk (12/16/2023)   Housing Stability Vital Sign    Unable to Pay for Housing in the Last Year: No    Number of Times Moved in the  Last Year: 0    Homeless in the Last Year: No  Utilities: Not At Risk (12/16/2023)   AHC Utilities    Threatened with loss of utilities: No  Health Literacy: Not on file   Family History  Problem Relation Age of Onset   Healthy Mother    Lung cancer Father    Colon cancer Neg Hx    Stomach cancer Neg Hx    Esophageal cancer Neg Hx       VITAL SIGNS BP (!) 142/76   Pulse 70   Temp 97.8 F (36.6 C)   Resp 18   Ht 5' (1.524 m)   Wt 95 lb 9.6 oz (43.4 kg)   SpO2 96%   BMI 18.67 kg/m   Outpatient Encounter Medications as of 10/29/2024  Medication Sig   acetaminophen  (TYLENOL ) 325 MG tablet Take 2 tablets (650 mg total) by mouth every 6 (six) hours as needed for mild pain (or Fever >/= 101).   amLODipine  (NORVASC ) 5 MG tablet Take 1 tablet (5 mg total) by mouth daily.   Cholecalciferol  (VITAMIN D ) 2000 UNITS CAPS Take by mouth as directed. Take 1 tablet daily except none on saturdays and sundays   CVS VITAMIN C  500 MG tablet TAKE 1 TABLET (500 MG TOTAL) BY MOUTH DAILY.   docusate sodium  (COLACE) 100 MG capsule Take 1 capsule (100 mg total) by mouth 2 (two) times daily.   Ensure (ENSURE) Take 237 mLs by mouth daily.   Fe Fum-FePoly-Vit C-Vit B3 (INTEGRA) 62.5-62.5-40-3 MG CAPS TAKE 1 CAPSULE BY MOUTH EVERY DAY   GEMTESA 75 MG TABS Take 1 tablet by mouth daily.   meloxicam (MOBIC) 7.5 MG tablet Take 7.5 mg by mouth daily.   metoCLOPramide  (REGLAN ) 5 MG tablet Take 5 mg by mouth at bedtime. (Patient not taking: Reported on 09/02/2024)   Nutritional Supplements (,FEEDING SUPPLEMENT, PROSOURCE PLUS) liquid Take 30 mLs by mouth 3 (three) times daily between meals.   omeprazole  (PRILOSEC) 20 MG capsule Take 20 mg by mouth daily.   potassium chloride  SA (KLOR-CON  M) 20 MEQ tablet Take 1 tablet (20 mEq total) by mouth daily.   No facility-administered encounter medications on file as of 10/29/2024.     SIGNIFICANT DIAGNOSTIC EXAMS       ASSESSMENT/ PLAN:     Barnie Seip  NP [facility]   call [phone]     [  1]  Allergies Allergen Reactions   Prevnar 13 [Pneumococcal 13-Val Conj Vacc] Hives    Red/streaking arm   Penicillins Hives   Tetanus Toxoid-Containing Vaccines Other (See Comments)    Area of injection was red and streaked.   Codeine Nausea Only   Sulfonamide Derivatives Nausea Only

## 2024-10-31 ENCOUNTER — Non-Acute Institutional Stay (SKILLED_NURSING_FACILITY): Payer: Self-pay | Admitting: Adult Health

## 2024-10-31 ENCOUNTER — Encounter: Payer: Self-pay | Admitting: Adult Health

## 2024-10-31 DIAGNOSIS — R29818 Other symptoms and signs involving the nervous system: Secondary | ICD-10-CM | POA: Diagnosis not present

## 2024-10-31 DIAGNOSIS — K900 Celiac disease: Secondary | ICD-10-CM

## 2024-10-31 DIAGNOSIS — R4189 Other symptoms and signs involving cognitive functions and awareness: Secondary | ICD-10-CM

## 2024-10-31 DIAGNOSIS — I700 Atherosclerosis of aorta: Secondary | ICD-10-CM | POA: Diagnosis not present

## 2024-10-31 NOTE — Progress Notes (Signed)
 " Location:  [facility] Room Number: [address] Place of Service:  SNF (31)   CODE STATUS: DNR   Allergies[1]  Chief Complaint  Patient presents with   Care Plan Meeting    HPI:  We have come together for her care plan meeting. BIMS 14/15 mood 3/30: reporting some depression. Uses wheelchair without falls. She requires independent with her adl care. She is frequently incontinent of bladder and bowel. Dietary: feeds self; gluten free diet; appetite 51-75% of diet; weight is 98 pounds is on supplements. Recently discharged from therapy. Activities: does participate. 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 - and then [employer] x 15 yrs - retiredWalks daily - exercise   Close with her large church family   Still driving and very independent   Social Drivers of Health   Tobacco Use: Low Risk (10/31/2024)   Patient History    Smoking Tobacco Use: Never    Smokeless Tobacco Use: Never    Passive Exposure: Not on file  Financial Resource Strain: Low Risk (12/15/2022)   Overall Financial Resource Strain (CARDIA)    Difficulty of Paying Living Expenses: Not hard at all  Food Insecurity: No Food Insecurity (12/16/2023)   Hunger Vital Sign    Worried About Running Out of Food in the Last Year: Never true    Ran Out of Food in the Last Year: Never true  Transportation Needs: No Transportation Needs (12/16/2023)   PRAPARE - Administrator, Civil Service (Medical): No    Lack of Transportation (Non-Medical): No  Physical  Activity: Insufficiently Active (12/15/2022)   Exercise Vital Sign    Days of Exercise per Week: 3 days    Minutes of Exercise per Session: 30 min  Stress: No Stress Concern Present (12/15/2022)   [employer] of Occupational Health - Occupational Stress Questionnaire    Feeling of Stress : Not at all  Social Connections: Moderately Integrated (12/16/2023)   Social Connection and Isolation Panel    Frequency of Communication with Friends and Family: More than three times a week    Frequency of Social Gatherings with Friends and Family: More than three times a week    Attends Religious Services: More than 4 times per year    Active Member of [employer] or Organizations: Yes    Attends Banker Meetings: More than 4 times per year    Marital Status: Widowed  Intimate Partner Violence: Not At Risk (12/16/2023)    Humiliation, Afraid, Rape, and Kick questionnaire    Fear of Current or Ex-Partner: No    Emotionally Abused: No    Physically Abused: No    Sexually Abused: No  Depression (PHQ2-9): Low Risk (04/02/2024)   Depression (PHQ2-9)    PHQ-2 Score: 0  Alcohol Screen: Low Risk (12/15/2022)   Alcohol Screen    Last Alcohol Screening Score (AUDIT): 0  Housing: Low Risk (12/16/2023)   Housing Stability Vital Sign    Unable to Pay for Housing in the Last Year: No    Number of Times Moved in the Last Year: 0    Homeless in the Last Year: No  Utilities: Not At Risk (12/16/2023)   AHC Utilities    Threatened with loss of utilities: No  Health Literacy: Not on file   Family History  Problem Relation Age of Onset   Healthy Mother    Lung cancer Father    Colon cancer Neg Hx    Stomach cancer Neg Hx    Esophageal cancer Neg Hx       VITAL SIGNS BP (!) 142/76   Pulse 70   Temp 97.8 F (36.6 C)   Resp 18   Ht 5' (1.524 m)   Wt 95 lb 9.6 oz (43.4 kg)   SpO2 96%   BMI 18.67 kg/m   Outpatient Encounter Medications as of 10/31/2024  Medication Sig   acetaminophen  (TYLENOL ) 325 MG tablet Take 2 tablets (650 mg total) by mouth every 6 (six) hours as needed for mild pain (or Fever >/= 101).   amLODipine  (NORVASC ) 5 MG tablet Take 1 tablet (5 mg total) by mouth daily.   Cholecalciferol  (VITAMIN D ) 2000 UNITS CAPS Take by mouth as directed. Take 1 tablet daily except none on saturdays and sundays   [facility] VITAMIN C  500 MG tablet TAKE 1 TABLET (500 MG TOTAL) BY MOUTH DAILY.   docusate sodium  (COLACE) 100 MG capsule Take 1 capsule (100 mg total) by mouth 2 (two) times daily.   Ensure (ENSURE) Take 237 mLs by mouth daily.   Fe Fum-FePoly-Vit C-Vit B3 (INTEGRA) 62.5-62.5-40-3 MG CAPS TAKE 1 CAPSULE BY MOUTH EVERY DAY   GEMTESA 75 MG TABS Take 1 tablet by mouth daily.   meloxicam (MOBIC) 7.5 MG tablet Take 7.5 mg by mouth daily.   Nutritional Supplements (,FEEDING SUPPLEMENT, PROSOURCE PLUS) liquid Take  30 mLs by mouth 3 (three) times daily between meals.   omeprazole  (PRILOSEC) 20 MG capsule Take 20 mg by mouth daily.   potassium chloride  SA (KLOR-CON  M) 20 MEQ tablet Take 1 tablet (20  mEq total) by mouth daily.   metoCLOPramide  (REGLAN ) 5 MG tablet Take 5 mg by mouth at bedtime. (Patient not taking: Reported on 10/31/2024)   No facility-administered encounter medications on file as of 10/31/2024.     SIGNIFICANT DIAGNOSTIC EXAMS  PREVIOUS   12-16-23: wbc 6.9; hgb 14.3; hct 41.8; mcv 93.5 plt 160; glucose 129; bun 11; creat 0.54 k+ 3.7; na++ 136; ca 9.5 gfr >60 protein 6.1 albumin 3.78  02-08-24: tsh 3.083; RPR: nr vitamin B12: 293 04-29-24: tsh 3.184; free t4: 1.18 06-26-24: vitamin B12: 408 08-07-24: wbc 7.7; hgb 14.5; hct 43.1; mcv 91.3 plt 912; glucose 105; bun 28; creat 0.53; k+ 4.0; na++ 139; ca 8.9; gfr >60; protein 5.3 albumin 3.0    Review of Systems  Constitutional:  Negative for malaise/fatigue.  Respiratory:  Negative for cough and shortness of breath.   Cardiovascular:  Negative for chest pain, palpitations and leg swelling.  Gastrointestinal:  Negative for abdominal pain, constipation and heartburn.  Musculoskeletal:  Negative for back pain, joint pain and myalgias.  Skin: Negative.   Neurological:  Negative for dizziness.  Psychiatric/Behavioral:  The patient is not nervous/anxious.      Physical Exam Constitutional:      General: She is not in acute distress.    Appearance: She is underweight. She is not diaphoretic.  Neck:     Thyroid : No thyromegaly.  Cardiovascular:     Rate and Rhythm: Normal rate and regular rhythm.     Heart sounds: Normal heart sounds.  Pulmonary:     Effort: Pulmonary effort is normal. No respiratory distress.     Breath sounds: Normal breath sounds.  Abdominal:     General: Bowel sounds are normal. There is no distension.     Palpations: Abdomen is soft.     Tenderness: There is no abdominal tenderness.  Musculoskeletal:         General: Normal range of motion.     Cervical back: Neck supple.     Right lower leg: No edema.     Left lower leg: No edema.  Lymphadenopathy:     Cervical: No cervical adenopathy.  Skin:    General: Skin is warm and dry.  Neurological:     Mental Status: She is alert. Mental status is at baseline.  Psychiatric:        Mood and Affect: Mood normal.       ASSESSMENT/ PLAN:  TODAY  Aortic atherosclerosis Neurocognitive deficits Celiac sprue   Will continue current medications Will continue current plan of care Will continue to monitor her status.   Time spent with patient: 40 minutes: medications; dietary needs; weight   Barnie Seip NP [facility]  call [phone]      [1]  Allergies Allergen Reactions   Prevnar 13 [Pneumococcal 13-Val Conj Vacc] Hives    Red/streaking arm   Penicillins Hives   Tetanus Toxoid-Containing Vaccines Other (See Comments)    Area of injection was red and streaked.   Codeine Nausea Only   Sulfonamide Derivatives Nausea Only   "

## 2024-11-07 ENCOUNTER — Other Ambulatory Visit (HOSPITAL_COMMUNITY)
Admission: RE | Admit: 2024-11-07 | Discharge: 2024-11-07 | Disposition: A | Discharge status: Home / Self Care | Source: Skilled Nursing Facility | Attending: Adult Health | Admitting: Adult Health

## 2024-11-07 DIAGNOSIS — M6281 Muscle weakness (generalized): Secondary | ICD-10-CM | POA: Insufficient documentation

## 2024-11-07 LAB — RESP PANEL BY RT-PCR (RSV, FLU A&B, COVID)  RVPGX2
Influenza A by PCR: NEGATIVE
Influenza B by PCR: NEGATIVE
Resp Syncytial Virus by PCR: NEGATIVE
SARS Coronavirus 2 by RT PCR: NEGATIVE

## 2024-11-11 ENCOUNTER — Other Ambulatory Visit (HOSPITAL_COMMUNITY)
Admission: RE | Admit: 2024-11-11 | Discharge: 2024-11-11 | Disposition: A | Discharge status: Home / Self Care | Source: Skilled Nursing Facility | Attending: Adult Health | Admitting: Adult Health

## 2024-11-11 DIAGNOSIS — I10 Essential (primary) hypertension: Secondary | ICD-10-CM | POA: Diagnosis present

## 2024-11-11 LAB — BASIC METABOLIC PANEL WITH GFR
Anion gap: 3 — ABNORMAL LOW (ref 5–15)
BUN: 22 mg/dL (ref 8–23)
CO2: 32 mmol/L (ref 22–32)
Calcium: 8.8 mg/dL — ABNORMAL LOW (ref 8.9–10.3)
Chloride: 104 mmol/L (ref 98–111)
Creatinine, Ser: 0.59 mg/dL (ref 0.44–1.00)
GFR, Estimated: >60 mL/min
Glucose, Bld: 106 mg/dL — ABNORMAL HIGH (ref 70–99)
Potassium: 4.1 mmol/L (ref 3.5–5.1)
Sodium: 139 mmol/L (ref 135–145)

## 2024-11-26 ENCOUNTER — Non-Acute Institutional Stay (SKILLED_NURSING_FACILITY): Payer: Self-pay | Admitting: Adult Health

## 2024-11-26 ENCOUNTER — Encounter: Payer: Self-pay | Admitting: Adult Health

## 2024-11-26 DIAGNOSIS — K5909 Other constipation: Secondary | ICD-10-CM | POA: Diagnosis not present

## 2024-11-26 DIAGNOSIS — D649 Anemia, unspecified: Secondary | ICD-10-CM

## 2024-11-26 DIAGNOSIS — I700 Atherosclerosis of aorta: Secondary | ICD-10-CM

## 2024-11-26 NOTE — Progress Notes (Signed)
 " Location:  [facility] Room Number: [address] Place of Service:  SNF (31)   CODE STATUS: DNR   Allergies[1]  Chief Complaint  Patient presents with   Medical Management of Chronic Issues          Chronic anemia: Constipation: Aortic atherosclerosis    HPI:  She is a 89 y.o. long term resident of this facility being seen for the management of her chronic illnesses:Chronic anemia: Constipation: Aortic atherosclerosis. There are no reports of uncontrolled pain. No reports of constipation present. Her weight remains stable.    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 benign tumor     Social History   Socioeconomic History   Marital status: Widowed    Spouse name: Not on file   Number of children: 0   Years of education: 16   Highest education level: Bachelor's degree (e.g., BA, AB, BS)  Occupational History   Occupation: retired    Associate Professor: RETIRED  Tobacco Use   Smoking status: Never   Smokeless tobacco: Never  Vaping Use   Vaping status: Never Used  Substance and Sexual Activity   Alcohol use: No   Drug use: No   Sexual activity: Not Currently  Other Topics Concern   Not on  file  Social History Narrative   WidowNo childrenReceptionist at [employer]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 47 yrs - and then [employer] x 15 yrs - retiredWalks daily - exercise   Close with her large church family   Still driving and very independent   Social Drivers of Health   Tobacco Use: Low Risk (11/26/2024)   Patient History    Smoking Tobacco Use: Never    Smokeless Tobacco Use: Never    Passive Exposure: Not on file  Financial Resource Strain: Low Risk (12/15/2022)   Overall Financial Resource Strain (CARDIA)    Difficulty of Paying Living Expenses: Not hard at all  Food Insecurity: No Food Insecurity (12/16/2023)   Hunger Vital Sign    Worried About Running Out of Food in the Last Year: Never true    Ran Out of Food in the Last Year: Never true  Transportation Needs: No Transportation Needs (12/16/2023)   PRAPARE - Administrator, Civil Service (Medical): No    Lack of Transportation (Non-Medical): No  Physical Activity: Insufficiently Active (12/15/2022)   Exercise Vital Sign    Days of Exercise per Week: 3 days    Minutes of  Exercise per Session: 30 min  Stress: No Stress Concern Present (12/15/2022)   [employer] of Occupational Health - Occupational Stress Questionnaire    Feeling of Stress : Not at all  Social Connections: Moderately Integrated (12/16/2023)   Social Connection and Isolation Panel    Frequency of Communication with Friends and Family: More than three times a week    Frequency of Social Gatherings with Friends and Family: More than three times a week    Attends Religious Services: More than 4 times per year    Active Member of [employer] or Organizations: Yes    Attends Banker Meetings: More than 4 times per year    Marital Status: Widowed  Intimate Partner Violence: Not At Risk (12/16/2023)   Humiliation, Afraid, Rape, and Kick questionnaire    Fear of Current or Ex-Partner: No    Emotionally Abused: No    Physically Abused: No    Sexually Abused: No  Depression (PHQ2-9):  Low Risk (04/02/2024)   Depression (PHQ2-9)    PHQ-2 Score: 0  Alcohol Screen: Low Risk (12/15/2022)   Alcohol Screen    Last Alcohol Screening Score (AUDIT): 0  Housing: Low Risk (12/16/2023)   Housing Stability Vital Sign    Unable to Pay for Housing in the Last Year: No    Number of Times Moved in the Last Year: 0    Homeless in the Last Year: No  Utilities: Not At Risk (12/16/2023)   AHC Utilities    Threatened with loss of utilities: No  Health Literacy: Not on file   Family History  Problem Relation Age of Onset   Healthy Mother    Lung cancer Father    Colon cancer Neg Hx    Stomach cancer Neg Hx    Esophageal cancer Neg Hx       VITAL SIGNS BP 111/65   Pulse (!) 50   Temp 98.2 F (36.8 C)   Resp 19   Ht 5' (1.524 m)   Wt 97 lb (44 kg)   SpO2 99%   BMI 18.94 kg/m   Outpatient Encounter Medications as of 11/26/2024  Medication Sig   acetaminophen  (TYLENOL ) 325 MG tablet Take 2 tablets (650 mg total) by mouth every 6 (six) hours as needed for mild pain (or Fever >/= 101).   amLODipine  (NORVASC ) 5 MG tablet Take 1 tablet (5 mg total) by mouth daily.   Cholecalciferol  (VITAMIN D ) 2000 UNITS CAPS Take by mouth as directed. Take 1 tablet daily except none on saturdays and sundays   [facility]S VITAMIN C  500 MG tablet TAKE 1 TABLET (500 MG TOTAL) BY MOUTH DAILY.   docusate sodium  (COLACE) 100 MG capsule Take 1 capsule (100 mg total) by mouth 2 (two) times daily.   Ensure (ENSURE) Take 237 mLs by mouth daily.   Fe Fum-FePoly-Vit C-Vit B3 (INTEGRA) 62.5-62.5-40-3 MG CAPS TAKE 1 CAPSULE BY MOUTH EVERY DAY   GEMTESA 75 MG TABS Take 1 tablet by mouth daily.   meloxicam (MOBIC) 7.5 MG tablet Take 7.5 mg by mouth daily.   Nutritional Supplements (,FEEDING SUPPLEMENT, PROSOURCE PLUS) liquid Take 30 mLs by mouth 3 (three) times daily between meals.   omeprazole  (PRILOSEC) 20 MG capsule Take 20 mg by mouth daily.   potassium chloride  SA (KLOR-CON  M) 20 MEQ tablet Take 1 tablet (20 mEq  total) by mouth daily.   metoCLOPramide  (REGLAN ) 5 MG tablet Take 5 mg by mouth at bedtime. (Patient not taking: Reported on 11/26/2024)  No facility-administered encounter medications on file as of 11/26/2024.     SIGNIFICANT DIAGNOSTIC EXAMS  PREVIOUS   12-16-23: wbc 6.9; hgb 14.3; hct 41.8; mcv 93.5 plt 160; glucose 129; bun 11; creat 0.54 k+ 3.7; na++ 136; ca 9.5 gfr >60 protein 6.1 albumin 3.78  02-08-24: tsh 3.083; RPR: nr vitamin B12: 293 04-29-24: tsh 3.184; free t4: 1.18 06-26-24: vitamin B12: 408 08-07-24: wbc 7.7; hgb 14.5; hct 43.1; mcv 91.3 plt 912; glucose 105; bun 28; creat 0.53; k+ 4.0; na++ 139; ca 8.9; gfr >60; protein 5.3 albumin 3.0   TODAY  11-11-24: glucose 106; bun 22; creat 0.59; k+ 4.1; na++ 139; ca 8.8; gfr >60    Review of Systems  Constitutional:  Negative for malaise/fatigue.  Respiratory:  Negative for cough and shortness of breath.   Cardiovascular:  Negative for chest pain, palpitations and leg swelling.  Gastrointestinal:  Negative for abdominal pain, constipation and heartburn.  Musculoskeletal:  Negative for back pain, joint pain and myalgias.  Skin: Negative.   Neurological:  Negative for dizziness.  Psychiatric/Behavioral:  The patient is not nervous/anxious.    Physical Exam Constitutional:      General: She is not in acute distress.    Appearance: She is well-developed. She is not diaphoretic.  Neck:     Thyroid : No thyromegaly.  Cardiovascular:     Rate and Rhythm: Normal rate and regular rhythm.     Heart sounds: Normal heart sounds.  Pulmonary:     Effort: Pulmonary effort is normal. No respiratory distress.     Breath sounds: Normal breath sounds.  Abdominal:     General: Bowel sounds are normal. There is no distension.     Palpations: Abdomen is soft.     Tenderness: There is no abdominal tenderness.  Musculoskeletal:        General: Normal range of motion.     Cervical back: Neck supple.     Right lower leg: No edema.     Left  lower leg: No edema.  Lymphadenopathy:     Cervical: No cervical adenopathy.  Skin:    General: Skin is warm and dry.  Neurological:     Mental Status: She is alert. Mental status is at baseline.  Psychiatric:        Mood and Affect: Mood normal.          ASSESSMENT/ PLAN:  TODAY  Chronic anemia: hgb 14.3; will lower  intregra 62.5/62.5/40-3 mg to 3 times weekly   2. Constipation: will continue colace twice daily   3. Aortic atherosclerosis (ct 08-27-23) is not on statin due to advanced age.   PREVIOUS   4. Primary hypertension: b/p 142/76 will continue norvasc  5 mg daily   5. Urinary incontinence : will continue gemesta 75 mg daily   6. Neurocognitive deficits BCAT 04-24-24 34/50  7.  GERD without esophagitis: will continue prilosec 20 mg daily; and reglan  5 mg nightly   8. Underweight: BMI 18.94 will continue supplements as directed; weight is without significant change.      Barnie Seip NP [facility]  call [phone]      [1]  Allergies Allergen Reactions   Prevnar 13 [Pneumococcal 13-Val Conj Vacc] Hives    Red/streaking arm   Penicillins Hives   Tetanus Toxoid-Containing Vaccines Other (See Comments)    Area of injection was red and streaked.   Codeine Nausea Only   Sulfonamide Derivatives Nausea Only   "
